# Patient Record
Sex: Female | Born: 1935 | Race: White | Hispanic: No | Smoking: Never smoker
Health system: Southern US, Community
[De-identification: ages and names within clinical notes are randomized; demographics above are authoritative.]

## PROBLEM LIST (undated history)

## (undated) ENCOUNTER — Emergency Department: Discharge status: Absent without leave | Payer: Medicare Other

## (undated) DIAGNOSIS — Z974 Presence of external hearing-aid: Secondary | ICD-10-CM

## (undated) DIAGNOSIS — I340 Nonrheumatic mitral (valve) insufficiency: Secondary | ICD-10-CM

## (undated) DIAGNOSIS — I499 Cardiac arrhythmia, unspecified: Secondary | ICD-10-CM

## (undated) DIAGNOSIS — M1990 Unspecified osteoarthritis, unspecified site: Secondary | ICD-10-CM

## (undated) DIAGNOSIS — I509 Heart failure, unspecified: Secondary | ICD-10-CM

## (undated) DIAGNOSIS — N200 Calculus of kidney: Secondary | ICD-10-CM

## (undated) DIAGNOSIS — I10 Essential (primary) hypertension: Secondary | ICD-10-CM

## (undated) DIAGNOSIS — B029 Zoster without complications: Secondary | ICD-10-CM

## (undated) DIAGNOSIS — Z87898 Personal history of other specified conditions: Secondary | ICD-10-CM

## (undated) DIAGNOSIS — N12 Tubulo-interstitial nephritis, not specified as acute or chronic: Secondary | ICD-10-CM

## (undated) DIAGNOSIS — B019 Varicella without complication: Secondary | ICD-10-CM

## (undated) DIAGNOSIS — R112 Nausea with vomiting, unspecified: Secondary | ICD-10-CM

## (undated) DIAGNOSIS — R319 Hematuria, unspecified: Secondary | ICD-10-CM

## (undated) DIAGNOSIS — T4145XA Adverse effect of unspecified anesthetic, initial encounter: Secondary | ICD-10-CM

## (undated) DIAGNOSIS — E7800 Pure hypercholesterolemia, unspecified: Secondary | ICD-10-CM

## (undated) DIAGNOSIS — T8859XA Other complications of anesthesia, initial encounter: Secondary | ICD-10-CM

## (undated) DIAGNOSIS — M810 Age-related osteoporosis without current pathological fracture: Secondary | ICD-10-CM

## (undated) DIAGNOSIS — K219 Gastro-esophageal reflux disease without esophagitis: Secondary | ICD-10-CM

## (undated) DIAGNOSIS — Z9889 Other specified postprocedural states: Secondary | ICD-10-CM

## (undated) DIAGNOSIS — IMO0001 Reserved for inherently not codable concepts without codable children: Secondary | ICD-10-CM

## (undated) HISTORY — DX: Tubulo-interstitial nephritis, not specified as acute or chronic: N12

## (undated) HISTORY — PX: FOOT SURGERY: SHX648

## (undated) HISTORY — DX: Zoster without complications: B02.9

## (undated) HISTORY — DX: Personal history of other specified conditions: Z87.898

## (undated) HISTORY — DX: Varicella without complication: B01.9

## (undated) HISTORY — DX: Presence of external hearing-aid: Z97.4

## (undated) HISTORY — PX: JOINT REPLACEMENT: SHX530

## (undated) HISTORY — DX: Pure hypercholesterolemia, unspecified: E78.00

## (undated) HISTORY — PX: EYE SURGERY: SHX253

## (undated) HISTORY — DX: Nonrheumatic mitral (valve) insufficiency: I34.0

## (undated) HISTORY — PX: TOTAL KNEE ARTHROPLASTY: SHX125

## (undated) HISTORY — PX: BREAST CYST EXCISION: SHX579

## (undated) HISTORY — PX: RECTOCELE REPAIR: SHX761

## (undated) HISTORY — PX: KNEE ARTHROSCOPY: SUR90

## (undated) HISTORY — PX: OTHER SURGICAL HISTORY: SHX169

## (undated) HISTORY — DX: Age-related osteoporosis without current pathological fracture: M81.0

## (undated) HISTORY — DX: Essential (primary) hypertension: I10

---

## 1968-12-27 HISTORY — PX: VAGINAL HYSTERECTOMY: SUR661

## 1982-12-27 HISTORY — PX: BACK SURGERY: SHX140

## 2005-12-27 DIAGNOSIS — B019 Varicella without complication: Secondary | ICD-10-CM

## 2005-12-27 DIAGNOSIS — B029 Zoster without complications: Secondary | ICD-10-CM

## 2005-12-27 HISTORY — DX: Zoster without complications: B02.9

## 2005-12-27 HISTORY — DX: Varicella without complication: B01.9

## 2006-01-05 ENCOUNTER — Ambulatory Visit: Payer: Self-pay | Admitting: Internal Medicine

## 2006-08-02 ENCOUNTER — Ambulatory Visit: Payer: Self-pay | Admitting: Internal Medicine

## 2006-08-11 ENCOUNTER — Ambulatory Visit: Payer: Self-pay | Admitting: Internal Medicine

## 2007-08-14 ENCOUNTER — Ambulatory Visit: Payer: Self-pay | Admitting: Internal Medicine

## 2007-08-21 ENCOUNTER — Ambulatory Visit: Payer: Self-pay | Admitting: Internal Medicine

## 2008-06-27 LAB — HM PAP SMEAR: HM Pap smear: NORMAL

## 2008-07-27 LAB — HM MAMMOGRAPHY: HM Mammogram: NORMAL

## 2008-08-13 ENCOUNTER — Ambulatory Visit: Payer: Self-pay | Admitting: Internal Medicine

## 2008-11-12 ENCOUNTER — Ambulatory Visit: Payer: Self-pay | Admitting: Family

## 2009-02-04 ENCOUNTER — Ambulatory Visit: Payer: Self-pay | Admitting: Specialist

## 2009-02-13 ENCOUNTER — Ambulatory Visit: Payer: Self-pay | Admitting: Specialist

## 2010-01-01 ENCOUNTER — Ambulatory Visit: Payer: Self-pay | Admitting: Family

## 2010-01-13 ENCOUNTER — Ambulatory Visit: Payer: Self-pay | Admitting: Family

## 2010-02-18 ENCOUNTER — Ambulatory Visit: Payer: Self-pay | Admitting: Family

## 2010-03-02 ENCOUNTER — Ambulatory Visit: Payer: Self-pay | Admitting: General Practice

## 2010-10-01 ENCOUNTER — Ambulatory Visit: Payer: Self-pay | Admitting: Unknown Physician Specialty

## 2010-10-30 ENCOUNTER — Ambulatory Visit: Payer: Self-pay | Admitting: General Practice

## 2010-12-04 ENCOUNTER — Encounter
Admission: RE | Admit: 2010-12-04 | Discharge: 2010-12-04 | Payer: Self-pay | Source: Home / Self Care | Attending: Orthopedic Surgery | Admitting: Orthopedic Surgery

## 2011-01-08 ENCOUNTER — Encounter
Admission: RE | Admit: 2011-01-08 | Discharge: 2011-01-08 | Payer: Self-pay | Source: Home / Self Care | Attending: Orthopedic Surgery | Admitting: Orthopedic Surgery

## 2011-04-12 ENCOUNTER — Ambulatory Visit: Payer: Self-pay | Admitting: Internal Medicine

## 2011-05-31 ENCOUNTER — Ambulatory Visit: Payer: Self-pay | Admitting: General Practice

## 2011-06-23 ENCOUNTER — Inpatient Hospital Stay: Payer: Self-pay | Admitting: General Practice

## 2011-08-13 ENCOUNTER — Encounter: Payer: Self-pay | Admitting: Internal Medicine

## 2011-08-20 ENCOUNTER — Ambulatory Visit (INDEPENDENT_AMBULATORY_CARE_PROVIDER_SITE_OTHER): Payer: Medicare Other | Admitting: Internal Medicine

## 2011-08-20 ENCOUNTER — Encounter: Payer: Self-pay | Admitting: Internal Medicine

## 2011-08-20 VITALS — BP 146/89 | HR 77 | Temp 98.4°F | Resp 16 | Ht 62.0 in | Wt 188.5 lb

## 2011-08-20 DIAGNOSIS — M1990 Unspecified osteoarthritis, unspecified site: Secondary | ICD-10-CM | POA: Insufficient documentation

## 2011-08-20 DIAGNOSIS — E785 Hyperlipidemia, unspecified: Secondary | ICD-10-CM

## 2011-08-20 DIAGNOSIS — E7849 Other hyperlipidemia: Secondary | ICD-10-CM | POA: Insufficient documentation

## 2011-08-20 DIAGNOSIS — I10 Essential (primary) hypertension: Secondary | ICD-10-CM | POA: Insufficient documentation

## 2011-08-20 MED ORDER — LISINOPRIL 40 MG PO TABS: 40.0000 mg | ORAL_TABLET | Freq: Every day | ORAL | Status: DC

## 2011-08-20 NOTE — Progress Notes (Signed)
Subjective:    Patient ID: Tina Howard, female    DOB: [birth year], 77 y.o.   MRN: [id number]  HPI  Tina Howard is a 77 year old female with a history of hypertension and osteoarthritis who presents for followup. She recently had right total knee replacement. During her perioperative period her blood pressure was elevated and she was briefly placed on amlodipine. She has since been off amlodipine. She is currently taking lisinopril hydrochlorothiazide. She reports some elevated blood pressure readings particularly after physical therapy. Her highest recorded blood pressure was 180/90. She denies any headache, chest pain, palpitations, shortness of breath. She does have chronic mild right lower extremity edema.  In regards to her osteoarthritis she has done incredibly well postoperatively. She has completed physical therapy. She is able to ambulate without any assistance. She denies any significant pain in her right knee.  Review of Systems  Constitutional: Negative for fever, chills, appetite change, fatigue and unexpected weight change.  HENT: Negative for ear pain, congestion, sore throat, trouble swallowing, neck pain, voice change and sinus pressure.   Eyes: Negative for visual disturbance.  Respiratory: Negative for cough, shortness of breath, wheezing and stridor.   Cardiovascular: Positive for leg swelling. Negative for chest pain and palpitations.  Gastrointestinal: Negative for nausea, vomiting, abdominal pain, diarrhea, constipation, blood in stool, abdominal distention and anal bleeding.  Genitourinary: Negative for dysuria and flank pain.  Musculoskeletal: Positive for arthralgias. Negative for myalgias and gait problem.  Skin: Negative for color change and rash.  Neurological: Negative for dizziness and headaches.  Hematological: Negative for adenopathy. Does not bruise/bleed easily.  Psychiatric/Behavioral: Negative for suicidal ideas, sleep disturbance and dysphoric mood. The  patient is not nervous/anxious.        Objective:   Physical Exam  Constitutional: She is oriented to person, place, and time. She appears well-developed and well-nourished. No distress.  HENT:  Head: Normocephalic and atraumatic.  Nose: Nose normal.  Eyes: Conjunctivae are normal. Pupils are equal, round, and reactive to light. Right eye exhibits no discharge. Left eye exhibits no discharge. No scleral icterus.  Neck: Normal range of motion. Neck supple. No tracheal deviation present. No thyromegaly present.  Cardiovascular: Normal rate, regular rhythm and normal heart sounds.  Exam reveals no gallop and no friction rub.   No murmur heard. Pulmonary/Chest: Effort normal and breath sounds normal. No respiratory distress. She has no wheezes. She has no rales. She exhibits no tenderness.  Musculoskeletal: Normal range of motion. She exhibits edema (very mild edema around right ankle). She exhibits no tenderness.  Lymphadenopathy:    She has no cervical adenopathy.  Neurological: She is alert and oriented to person, place, and time. No cranial nerve deficit. She exhibits normal muscle tone. Coordination normal.  Skin: Skin is warm, dry and intact. No rash noted. She is not diaphoretic. No erythema. No pallor.          Scar at site of right knee replacement  Psychiatric: She has a normal mood and affect. Her behavior is normal. Judgment and thought content normal.          Assessment & Plan:  1. Hypertension - blood sugar is elevated today. She has also had some problems with hypokalemia. We will discontinue her lisinopril hydrochlorothiazide. We'll start her on lisinopril 40 mg daily. She will have her creatinine and electrolytes rechecked in one week. She will purchase a blood pressure monitor for home. She will bring her blood pressure monitor to our office to confirm accuracy.  She will return to clinic in one month.  2. Osteoarthritis - patient has done well postoperatively after  right knee replacement. She will continue to follow with Tina Howard. She will continue tramadol for pain.  3. Hyperlipidemia - patient has a history of hyperlipidemia. She is currently taking simvastatin. We will check fasting lipid profile and liver function test with labs in one week. She will return to clinic in one month for followup.

## 2011-08-20 NOTE — Patient Instructions (Signed)

## 2011-08-24 ENCOUNTER — Telehealth: Payer: Self-pay | Admitting: Internal Medicine

## 2011-08-24 NOTE — Telephone Encounter (Signed)
Pt takes 4 pills of  amoxcillian 500mg  when she goes to [facility]

## 2011-08-25 NOTE — Telephone Encounter (Signed)
If pt needs refill on Amoxicillin 500mg , 4tabs prior to dental procedures, we can refill this. I wasn't sure from Robin's message.

## 2011-08-26 MED ORDER — AMOXICILLIN 500 MG PO CAPS: ORAL_CAPSULE | ORAL | Status: AC

## 2011-08-26 NOTE — Telephone Encounter (Signed)
Rx sent to pharmacy for patient to take prior to dental procedure.

## 2011-08-31 ENCOUNTER — Other Ambulatory Visit: Payer: Self-pay | Admitting: *Deleted

## 2011-08-31 MED ORDER — SIMVASTATIN 20 MG PO TABS: 20.0000 mg | ORAL_TABLET | Freq: Every day | ORAL | Status: DC

## 2011-09-06 ENCOUNTER — Other Ambulatory Visit: Payer: Self-pay | Admitting: Internal Medicine

## 2011-09-06 MED ORDER — POTASSIUM CHLORIDE CRYS ER 20 MEQ PO TBCR: 20.0000 meq | EXTENDED_RELEASE_TABLET | Freq: Every day | ORAL | Status: DC

## 2011-09-06 NOTE — Telephone Encounter (Signed)
Is this okay to refill? 

## 2011-09-06 NOTE — Telephone Encounter (Signed)
Pt has an appoinment 09/22/11 But she is out of her potassium chl tab 20 meq cr could you call in enough  Until this appointment [facility] [address]

## 2011-09-09 ENCOUNTER — Telehealth: Payer: Self-pay | Admitting: Internal Medicine

## 2011-09-09 NOTE — Telephone Encounter (Signed)
PT WANTED TO BE ASAP SHE HAS COUGH,CONGESTION,SNEEZING. SHE HAD BEEN TAKEN TYLENOL EXTRA /GENERIC COUGH DRY COUGH X 3 WEEKS

## 2011-09-10 NOTE — Telephone Encounter (Signed)
Left message for patient to return my call.

## 2011-09-13 ENCOUNTER — Encounter: Payer: Self-pay | Admitting: Internal Medicine

## 2011-09-13 ENCOUNTER — Ambulatory Visit (INDEPENDENT_AMBULATORY_CARE_PROVIDER_SITE_OTHER): Payer: Medicare Other | Admitting: Internal Medicine

## 2011-09-13 VITALS — BP 149/81 | HR 81 | Temp 98.4°F | Resp 16 | Ht 62.0 in | Wt 186.5 lb

## 2011-09-13 DIAGNOSIS — J40 Bronchitis, not specified as acute or chronic: Secondary | ICD-10-CM

## 2011-09-13 DIAGNOSIS — I10 Essential (primary) hypertension: Secondary | ICD-10-CM

## 2011-09-13 DIAGNOSIS — E785 Hyperlipidemia, unspecified: Secondary | ICD-10-CM

## 2011-09-13 LAB — CBC WITH DIFFERENTIAL/PLATELET
Basophils Absolute: 0 10*3/uL (ref 0.0–0.1)
Basophils Relative: 0.6 % (ref 0.0–3.0)
Hemoglobin: 12.5 g/dL (ref 12.0–15.0)
Lymphocytes Relative: 24.5 % (ref 12.0–46.0)
Monocytes Relative: 6.5 % (ref 3.0–12.0)
Neutro Abs: 4.6 10*3/uL (ref 1.4–7.7)
RBC: 4.78 Mil/uL (ref 3.87–5.11)
WBC: 7 10*3/uL (ref 4.5–10.5)

## 2011-09-13 LAB — MICROALBUMIN / CREATININE URINE RATIO
Microalb Creat Ratio: 0.8 mg/g (ref 0.0–30.0)
Microalb, Ur: 0.4 mg/dL (ref 0.0–1.9)

## 2011-09-13 LAB — COMPREHENSIVE METABOLIC PANEL
ALT: 18 U/L (ref 0–35)
Albumin: 4.3 g/dL (ref 3.5–5.2)
BUN: 13 mg/dL (ref 6–23)
CO2: 30 mEq/L (ref 19–32)
Calcium: 9.6 mg/dL (ref 8.4–10.5)
Chloride: 103 mEq/L (ref 96–112)
Creatinine, Ser: 0.5 mg/dL (ref 0.4–1.2)
GFR: 133.54 mL/min (ref >60.00)
Potassium: 3.7 mEq/L (ref 3.5–5.1)

## 2011-09-13 LAB — LIPID PANEL: HDL: 61.4 mg/dL (ref >39.00)

## 2011-09-13 MED ORDER — PREDNISONE (PAK) 10 MG PO TABS: ORAL_TABLET | ORAL | Status: DC

## 2011-09-13 MED ORDER — AZITHROMYCIN 250 MG PO TABS: ORAL_TABLET | ORAL | Status: AC

## 2011-09-13 NOTE — Progress Notes (Signed)
Subjective:    Patient ID: Tina Howard, female    DOB: [birth year], 77 y.o.   MRN: [id number]  HPI Tina Howard is a 77 year old female with a history of hypertension who presents for an acute visit complaining of cough which is productive of purulent sputum and shortness of breath which has been present for approximately one week. She reports her symptoms initially started with sore throat and congestion followed by muscle aches. She also felt that she may have had a low-grade temperature. She developed a cough soon after that and this cough has persisted for the last 2 weeks. She does not have any known sick contacts however participates often in church events. She has taken over-the-counter cough preparations with no improvement in her symptoms.  Outpatient Encounter Prescriptions as of 09/13/2011  Medication Sig Dispense Refill  . amoxicillin (AMOXIL) 500 MG capsule 4 tabs prior to dental procedure.  4 capsule  0  . aspirin 81 MG tablet Take 81 mg by mouth daily.        . Calcium Carbonate-Vitamin D (CALCIUM + D) 600-200 MG-UNIT TABS Take one tablet by mouth twice a day       . Docusate Calcium (STOOL SOFTENER PO) Take by mouth as directed       . lisinopril (PRINIVIL,ZESTRIL) 40 MG tablet Take 1 tablet (40 mg total) by mouth daily.  30 tablet  3  . Multiple Vitamins-Minerals (PX SENIOR VITAMIN PO) Take by mouth as directed       . potassium chloride SA (KLOR-CON M20) 20 MEQ tablet Take 1 tablet (20 mEq total) by mouth daily.  30 tablet  0  . simvastatin (ZOCOR) 20 MG tablet Take 1 tablet (20 mg total) by mouth daily.  30 tablet  3  . timolol (TIMOPTIC) 0.5 % ophthalmic solution Place 1 drop into both eyes daily.       . traMADol (ULTRAM) 50 MG tablet       . azithromycin (ZITHROMAX Z-PAK) 250 MG tablet Take 2 tablets (500 mg) on  Day 1,  followed by 1 tablet (250 mg) once daily on Days 2 through 5.  6 each  0  . predniSONE (STERAPRED UNI-PAK) 10 MG tablet Take 60mg  today, then taper by 10mg   daily until gone.  21 tablet  0    Review of Systems  Constitutional: Negative for fever, chills, appetite change, fatigue and unexpected weight change.  HENT: Positive for congestion and sinus pressure. Negative for ear pain, sore throat, trouble swallowing, neck pain and voice change.   Eyes: Negative for visual disturbance.  Respiratory: Positive for cough, chest tightness and shortness of breath. Negative for wheezing and stridor.   Cardiovascular: Negative for chest pain, palpitations and leg swelling.  Gastrointestinal: Negative for nausea, vomiting, abdominal pain, diarrhea, constipation, blood in stool, abdominal distention and anal bleeding.  Genitourinary: Negative for dysuria and flank pain.  Musculoskeletal: Positive for myalgias. Negative for arthralgias and gait problem.  Skin: Negative for color change and rash.  Neurological: Negative for dizziness and headaches.  Hematological: Negative for adenopathy. Does not bruise/bleed easily.  Psychiatric/Behavioral: Negative for suicidal ideas, sleep disturbance and dysphoric mood. The patient is not nervous/anxious.    BP 149/81  Pulse 81  Temp(Src) 98.4 F (36.9 C) (Oral)  Resp 16  Ht 5\' 2"  (1.575 m)  Wt 186 lb 8 oz (84.596 kg)  BMI 34.11 kg/m2  SpO2 98%     Objective:   Physical Exam  Constitutional: She is oriented to person,  place, and time. She appears well-developed and well-nourished. No distress.  HENT:  Head: Normocephalic and atraumatic.  Right Ear: External ear normal.  Left Ear: External ear normal.  Nose: Nose normal.  Mouth/Throat: Oropharynx is clear and moist. No oropharyngeal exudate.  Eyes: Conjunctivae are normal. Pupils are equal, round, and reactive to light. Right eye exhibits no discharge. Left eye exhibits no discharge. No scleral icterus.  Neck: Normal range of motion. Neck supple. No tracheal deviation present. No thyromegaly present.  Cardiovascular: Normal rate, regular rhythm, normal heart  sounds and intact distal pulses.  Exam reveals no gallop and no friction rub.   No murmur heard. Pulmonary/Chest: No accessory muscle usage. Not tachypneic. No respiratory distress. She has no decreased breath sounds. She has wheezes. She has no rales. She exhibits no tenderness.  Musculoskeletal: Normal range of motion. She exhibits no edema and no tenderness.  Lymphadenopathy:    She has no cervical adenopathy.  Neurological: She is alert and oriented to person, place, and time. No cranial nerve deficit. She exhibits normal muscle tone. Coordination normal.  Skin: Skin is warm and dry. No rash noted. She is not diaphoretic. No erythema. No pallor.  Psychiatric: She has a normal mood and affect. Her behavior is normal. Judgment and thought content normal.          Assessment & Plan:  1. Acute bronchitis - symptoms and exam are consistent with acute bronchitis. Will plan to treat with azithromycin and prednisone, and codeine for cough. Patient has tolerated this well in the past. She will call or return to clinic should symptoms not improve over the next 48 hours. She will followup in one week.

## 2011-09-13 NOTE — Patient Instructions (Signed)
Bronchitis Bronchitis is the body's way of reacting to injury and/or infection (inflammation) of the bronchi. Bronchi are the air tubes that extend from the windpipe into the lungs. If the inflammation becomes severe, it may cause shortness of breath.  CAUSES Inflammation may be caused by:  A virus.   Germs (bacteria).   Dust.   Allergens.   Pollutants and many other irritants.  The cells lining the bronchial tree are covered with tiny hairs (cilia). These constantly beat upward, away from the lungs, toward the mouth. This keeps the lungs free of pollutants. When these cells become too irritated and are unable to do their job, mucus begins to develop. This causes the characteristic cough of bronchitis. The cough clears the lungs when the cilia are unable to do their job. Without either of these protective mechanisms, the mucus would settle in the lungs. Then you would develop pneumonia. Smoking is a common cause of bronchitis and can contribute to pneumonia. Stopping this habit is the single most important thing you can do to help yourself. TREATMENT  Your caregiver may prescribe an antibiotic if the cough is caused by bacteria. Also, medicines that open up your airways make it easier to breathe. Your caregiver may also recommend or prescribe an expectorant. It will loosen the mucus to be coughed up. Only take over-the-counter or prescription medicines for pain, discomfort, or fever as directed by your caregiver.   Removing whatever causes the problem (smoking, for example) is critical to preventing the problem from getting worse.   Cough suppressants may be prescribed for relief of cough symptoms.   Inhaled medicines may be prescribed to help with symptoms now and to help prevent problems from returning.   For those with recurrent (chronic) bronchitis, there may be a need for steroid medicines.  SEEK IMMEDIATE MEDICAL CARE IF:  During treatment, you develop more pus-like mucus  (purulent sputum).   You or your child has an oral temperature above 101.5, not controlled by medicine.   Your baby is older than 3 months with a rectal temperature of 102 F (38.9 C) or higher.   Your baby is 3 months old or younger with a rectal temperature of 100.4 F (38 C) or higher.   You become progressively more ill.   You have increased difficulty breathing, wheezing, or shortness of breath.  It is necessary to seek immediate medical care if you are elderly or sick from any other disease. MAKE SURE YOU:  Understand these instructions.   Will watch your condition.   Will get help right away if you are not doing well or get worse.  Document Released: 12/13/2005 Document Re-Released: 03/09/2010 [employer] Patient Information 2011 [employer]. 

## 2011-09-20 ENCOUNTER — Other Ambulatory Visit: Payer: Medicare Other

## 2011-09-24 ENCOUNTER — Encounter: Payer: Self-pay | Admitting: Internal Medicine

## 2011-09-24 ENCOUNTER — Ambulatory Visit (INDEPENDENT_AMBULATORY_CARE_PROVIDER_SITE_OTHER): Payer: Medicare Other | Admitting: Internal Medicine

## 2011-09-24 VITALS — BP 162/93 | HR 78 | Temp 98.4°F | Resp 16 | Ht 62.0 in | Wt 186.0 lb

## 2011-09-24 DIAGNOSIS — Z23 Encounter for immunization: Secondary | ICD-10-CM

## 2011-09-24 DIAGNOSIS — J40 Bronchitis, not specified as acute or chronic: Secondary | ICD-10-CM

## 2011-09-24 DIAGNOSIS — E785 Hyperlipidemia, unspecified: Secondary | ICD-10-CM

## 2011-09-24 DIAGNOSIS — D239 Other benign neoplasm of skin, unspecified: Secondary | ICD-10-CM

## 2011-09-24 DIAGNOSIS — D229 Melanocytic nevi, unspecified: Secondary | ICD-10-CM

## 2011-09-24 DIAGNOSIS — I10 Essential (primary) hypertension: Secondary | ICD-10-CM

## 2011-09-24 MED ORDER — LISINOPRIL 40 MG PO TABS: 40.0000 mg | ORAL_TABLET | Freq: Every day | ORAL | Status: DC

## 2011-09-24 MED ORDER — POTASSIUM CHLORIDE CRYS ER 20 MEQ PO TBCR: 20.0000 meq | EXTENDED_RELEASE_TABLET | Freq: Every day | ORAL | Status: DC

## 2011-09-24 MED ORDER — SIMVASTATIN 20 MG PO TABS: 20.0000 mg | ORAL_TABLET | Freq: Every day | ORAL | Status: DC

## 2011-09-24 MED ORDER — BLOOD PRESSURE KIT: 1.0000 | PACK | Status: DC | PRN

## 2011-09-24 NOTE — Patient Instructions (Signed)
Follow up blood pressure check in 1 week.

## 2011-09-24 NOTE — Progress Notes (Signed)
Subjective:    Patient ID: Tina Howard, female    DOB: April 12, 1933, 77 y.o.   MRN: [id number]  HPI Tina Howard is a 77 year old female with a history of hypertension hyperlipidemia and a recent episode of bronchitis who presents for followup. She reports that her cough is improved. She continues to have some postnasal drip. She reports that her voice is still slightly hoarse from her upper respiratory infection. She denies any persistent shortness of breath. She does have intermittent cough. She has completed all of her antibiotics. She denies any fever or chills.  She reports that her blood pressure has been slightly elevated on some occasions typically 150 over 80s. She denies any chest pain, palpitations, headache. She reports full compliance with her medications.  Outpatient Encounter Prescriptions as of 09/24/2011  Medication Sig Dispense Refill  . amoxicillin (AMOXIL) 500 MG capsule 4 tabs prior to dental procedure.  4 capsule  0  . aspirin 81 MG tablet Take 81 mg by mouth daily.        . Calcium Carbonate-Vitamin D (CALCIUM + D) 600-200 MG-UNIT TABS Take one tablet by mouth twice a day       . Docusate Calcium (STOOL SOFTENER PO) Take by mouth as directed       . lisinopril (PRINIVIL,ZESTRIL) 40 MG tablet Take 1 tablet (40 mg total) by mouth daily.  90 tablet  4  . Multiple Vitamins-Minerals (PX SENIOR VITAMIN PO) Take by mouth as directed       . potassium chloride SA (KLOR-CON M20) 20 MEQ tablet Take 1 tablet (20 mEq total) by mouth daily.  90 tablet  4  . simvastatin (ZOCOR) 20 MG tablet Take 1 tablet (20 mg total) by mouth daily.  90 tablet  4  . timolol (TIMOPTIC) 0.5 % ophthalmic solution Place 1 drop into both eyes daily.         Review of Systems  Constitutional: Negative for fever, chills, appetite change, fatigue and unexpected weight change.  HENT: Positive for voice change. Negative for ear pain, congestion, sore throat, trouble swallowing, neck pain and sinus pressure.     Eyes: Negative for visual disturbance.  Respiratory: Positive for cough. Negative for shortness of breath, wheezing and stridor.   Cardiovascular: Negative for chest pain, palpitations and leg swelling.  Gastrointestinal: Negative for nausea, vomiting, abdominal pain, diarrhea, constipation, blood in stool, abdominal distention and anal bleeding.  Genitourinary: Negative for dysuria and flank pain.  Musculoskeletal: Negative for myalgias, arthralgias and gait problem.  Skin: Negative for color change and rash.  Neurological: Negative for dizziness and headaches.  Hematological: Negative for adenopathy. Does not bruise/bleed easily.  Psychiatric/Behavioral: Negative for suicidal ideas, sleep disturbance and dysphoric mood. The patient is not nervous/anxious.    BP 162/93  Pulse 78  Temp(Src) 98.4 F (36.9 C) (Oral)  Resp 16  Ht 5\' 2"  (1.575 m)  Wt 186 lb (84.369 kg)  BMI 34.02 kg/m2  SpO2 99%     Objective:   Physical Exam  Constitutional: She is oriented to person, place, and time. She appears well-developed and well-nourished. No distress.  HENT:  Head: Normocephalic and atraumatic.  Right Ear: Tympanic membrane, external ear and ear canal normal.  Left Ear: Tympanic membrane, external ear and ear canal normal.  Nose: Nose normal.  Mouth/Throat: Oropharynx is clear and moist. No oropharyngeal exudate.  Eyes: Conjunctivae are normal. Pupils are equal, round, and reactive to light. Right eye exhibits no discharge. Left eye exhibits no discharge. No scleral  icterus.  Neck: Normal range of motion. Neck supple. No tracheal deviation present. No thyromegaly present.  Cardiovascular: Normal rate, regular rhythm, normal heart sounds and intact distal pulses.  Exam reveals no gallop and no friction rub.   No murmur heard. Pulmonary/Chest: Effort normal and breath sounds normal. No respiratory distress. She has no wheezes. She has no rales. She exhibits no tenderness.  Musculoskeletal:  Normal range of motion. She exhibits no edema and no tenderness.  Lymphadenopathy:    She has no cervical adenopathy.  Neurological: She is alert and oriented to person, place, and time. No cranial nerve deficit. She exhibits normal muscle tone. Coordination normal.  Skin: Skin is warm and dry. No rash noted. She is not diaphoretic. No erythema. No pallor.          Atypical dark brown irregular nevus, right upper back  Psychiatric: She has a normal mood and affect. Her behavior is normal. Judgment and thought content normal.          Assessment & Plan:  1. Bronchitis -improved with antibiotic therapy. We'll continue to monitor. She will call if symptoms recur.  2. Hypertension - blood pressure slightly elevated today. Will plan to continue current medications for now and have her recheck blood pressure with the nurse check in one week. If blood pressure remains elevated we'll consider either adding back her hydrochlorothiazide or amlodipine. Recent renal function was normal.   3. Hyperlipidemia - patient with hyperlipidemia. Recent lipids show excellent control with LDL less than 70. Will continue simvastatin.  4. 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 Nevus has dark brown pigmentation and irregular borders. I've encouraged her to followup with her dermatologist for removal.

## 2011-10-08 ENCOUNTER — Ambulatory Visit: Payer: Medicare Other

## 2011-10-11 ENCOUNTER — Ambulatory Visit (INDEPENDENT_AMBULATORY_CARE_PROVIDER_SITE_OTHER): Payer: Medicare Other | Admitting: Internal Medicine

## 2011-10-11 ENCOUNTER — Encounter: Payer: Self-pay | Admitting: Internal Medicine

## 2011-10-11 VITALS — BP 137/78 | HR 82 | Temp 97.9°F | Resp 12 | Ht 62.0 in | Wt 188.0 lb

## 2011-10-11 DIAGNOSIS — D239 Other benign neoplasm of skin, unspecified: Secondary | ICD-10-CM

## 2011-10-11 DIAGNOSIS — D229 Melanocytic nevi, unspecified: Secondary | ICD-10-CM

## 2011-10-11 DIAGNOSIS — I10 Essential (primary) hypertension: Secondary | ICD-10-CM

## 2011-10-11 DIAGNOSIS — R42 Dizziness and giddiness: Secondary | ICD-10-CM

## 2011-10-11 NOTE — Progress Notes (Signed)
Subjective:    Patient ID: Tina Howard, female    DOB: [birth year], 77 y.o.   MRN: [id number]  HPI [name] is a 77 year old female who presents to followup hypertension. She was seen 3 days ago with blood pressures in the 200/100 range. She was started back on her amlodipine. She did not bring a record of her blood pressure this weekend, however she reports that she has been feeling better with less episodes of palpitations. She continues to have some episodes of dizziness particularly when moving from seated to standing position. She denies any chest pain. She denies any focal neurologic deficits. She denies any falls or syncope. She denies any weakness.  Outpatient Encounter Prescriptions as of 10/11/2011  Medication Sig Dispense Refill  . amLODipine (NORVASC) 5 MG tablet Take 5 mg by mouth daily.        Marland Kitchen aspirin 81 MG tablet Take 81 mg by mouth daily.        . Blood Pressure KIT 1 kit by Does not apply route as needed.  1 each  0  . Calcium Carbonate-Vitamin D (CALCIUM + D) 600-200 MG-UNIT TABS Take one tablet by mouth twice a day       . Docusate Calcium (STOOL SOFTENER PO) Take by mouth as directed       . lisinopril (PRINIVIL,ZESTRIL) 40 MG tablet Take 1 tablet (40 mg total) by mouth daily.  90 tablet  4  . Multiple Vitamins-Minerals (PX SENIOR VITAMIN PO) Take by mouth as directed       . potassium chloride SA (KLOR-CON M20) 20 MEQ tablet Take 1 tablet (20 mEq total) by mouth daily.  90 tablet  4  . timolol (TIMOPTIC) 0.5 % ophthalmic solution Place 1 drop into both eyes daily.       . simvastatin (ZOCOR) 20 MG tablet Take 1 tablet (20 mg total) by mouth daily.  90 tablet  4     Review of Systems  Constitutional: Negative for fever, chills, appetite change, fatigue and unexpected weight change.  HENT: Negative for ear pain, congestion, sore throat, trouble swallowing, neck pain, voice change and sinus pressure.   Eyes: Negative for visual disturbance.  Respiratory: Negative for  cough, shortness of breath, wheezing and stridor.   Cardiovascular: Negative for chest pain, palpitations and leg swelling.  Gastrointestinal: Negative for nausea, vomiting, abdominal pain, diarrhea, constipation, blood in stool, abdominal distention and anal bleeding.  Genitourinary: Negative for dysuria and flank pain.  Musculoskeletal: Positive for back pain. Negative for myalgias, arthralgias and gait problem.  Skin: Negative for color change and rash.  Neurological: Positive for dizziness and light-headedness. Negative for tremors, syncope, speech difficulty, weakness, numbness and headaches.  Hematological: Negative for adenopathy. Does not bruise/bleed easily.  Psychiatric/Behavioral: Negative for suicidal ideas, sleep disturbance and dysphoric mood. The patient is not nervous/anxious.    BP 137/78  Pulse 82  Temp(Src) 97.9 F (36.6 C) (Oral)  Resp 12  Ht 5\' 2"  (1.575 m)  Wt 188 lb (85.276 kg)  BMI 34.39 kg/m2  SpO2 98%     Objective:   Physical Exam  Constitutional: She is oriented to person, place, and time. She appears well-developed and well-nourished. No distress.  HENT:  Head: Normocephalic and atraumatic.  Right Ear: External ear normal.  Left Ear: External ear normal.  Nose: Nose normal.  Mouth/Throat: Oropharynx is clear and moist. No oropharyngeal exudate.  Eyes: Conjunctivae are normal. Pupils are equal, round, and reactive to light. Right eye exhibits no discharge.  Left eye exhibits no discharge. No scleral icterus.  Neck: Normal range of motion. Neck supple. No tracheal deviation present. No thyromegaly present.  Cardiovascular: Normal rate, regular rhythm, normal heart sounds and intact distal pulses.  Exam reveals no gallop and no friction rub.   No murmur heard. Pulmonary/Chest: Effort normal and breath sounds normal. No respiratory distress. She has no wheezes. She has no rales. She exhibits no tenderness.  Musculoskeletal: Normal range of motion. She  exhibits no edema and no tenderness.  Lymphadenopathy:    She has no cervical adenopathy.  Neurological: She is alert and oriented to person, place, and time. No cranial nerve deficit. She exhibits normal muscle tone. Coordination normal.  Skin: Skin is warm and dry. No rash noted. She is not diaphoretic. No erythema. No pallor.  Psychiatric: She has a normal mood and affect. Her behavior is normal. Judgment and thought content normal.          Assessment & Plan:  1. Hypertension - patient with history of hypertension. Recent exacerbation led to [place] restarting her amlodipine. Her blood pressure is much improved today. We'll plan to continue amlodipine 5 mg daily. She will followup in one month.  2. Dizziness - patient complains of dizziness when bending over or when moving from seated to standing position quickly. Suspect this is related to orthostatic changes. Exam is normal. However, will get carotid Dopplers for evaluation.

## 2011-10-15 ENCOUNTER — Other Ambulatory Visit: Payer: Self-pay | Admitting: *Deleted

## 2011-10-15 DIAGNOSIS — R42 Dizziness and giddiness: Secondary | ICD-10-CM

## 2011-10-26 ENCOUNTER — Encounter: Payer: Medicare Other | Admitting: *Deleted

## 2011-11-09 ENCOUNTER — Encounter (INDEPENDENT_AMBULATORY_CARE_PROVIDER_SITE_OTHER): Payer: Medicare Other | Admitting: *Deleted

## 2011-11-09 DIAGNOSIS — R42 Dizziness and giddiness: Secondary | ICD-10-CM

## 2011-11-16 ENCOUNTER — Encounter: Payer: Self-pay | Admitting: Internal Medicine

## 2011-11-16 ENCOUNTER — Ambulatory Visit (INDEPENDENT_AMBULATORY_CARE_PROVIDER_SITE_OTHER): Payer: Medicare Other | Admitting: Internal Medicine

## 2011-11-16 VITALS — BP 124/76 | HR 68 | Temp 98.0°F | Resp 14 | Wt 191.2 lb

## 2011-11-16 DIAGNOSIS — R002 Palpitations: Secondary | ICD-10-CM

## 2011-11-16 DIAGNOSIS — M1990 Unspecified osteoarthritis, unspecified site: Secondary | ICD-10-CM

## 2011-11-16 DIAGNOSIS — I10 Essential (primary) hypertension: Secondary | ICD-10-CM

## 2011-11-16 NOTE — Progress Notes (Signed)
Subjective:    Patient ID: Tina Howard, female    DOB: [birth year], 77 y.o.   MRN: [id number]  HPI 77YO female with h/o hypertension and osteoarthritis presents for follow up. She reports that she has been feeling well. She reports that her blood pressure has been well-controlled and notes that her systolic blood pressure was 110 on her last visit to her orthopedic surgeon. She notes that with the addition of amlodipine she occasionally has swelling in her lower legs. This seems to be worse in the evenings. She has also noted some intermittent palpitations. She describes this as a forceful heartbeat which is most prominent in the evenings. She denies any chest pain, diaphoresis, or shortness of breath. She otherwise reports she is feeling well.  She notes that she was recently seen by her orthopedic surgeon who performed imaging of her right shoulder. She reports severe osteoarthritis in her right shoulder and notes that she is scheduled for right shoulder replacement later this year. She is planning to stay in a rehabilitation facility after surgery. She is currently using Mobic for her right shoulder pain with minimal improvement.  Outpatient Encounter Prescriptions as of 11/16/2011  Medication Sig Dispense Refill  . amLODipine (NORVASC) 5 MG tablet Take 5 mg by mouth daily.        Marland Kitchen aspirin 81 MG tablet Take 81 mg by mouth daily.        . Blood Pressure KIT 1 kit by Does not apply route as needed.  1 each  0  . Calcium Carbonate-Vitamin D (CALCIUM + D) 600-200 MG-UNIT TABS Take one tablet by mouth twice a day       . Docusate Calcium (STOOL SOFTENER PO) Take by mouth as directed       . lisinopril (PRINIVIL,ZESTRIL) 40 MG tablet Take 1 tablet (40 mg total) by mouth daily.  90 tablet  4  . meloxicam (MOBIC) 7.5 MG tablet Take 7.5 mg by mouth 2 (two) times daily.        . Multiple Vitamins-Minerals (PX SENIOR VITAMIN PO) Take by mouth as directed       . potassium chloride SA (KLOR-CON M20) 20  MEQ tablet Take 1 tablet (20 mEq total) by mouth daily.  90 tablet  4  . simvastatin (ZOCOR) 20 MG tablet Take 1 tablet (20 mg total) by mouth daily.  90 tablet  4  . timolol (TIMOPTIC) 0.5 % ophthalmic solution Place 1 drop into both eyes daily.       . AMOXICILLIN PO Take by mouth as directed. PRIOR TO DENTAL ONLY.         Review of Systems  Constitutional: Negative for fever, chills, appetite change, fatigue and unexpected weight change.  HENT: Negative for ear pain, congestion, sore throat, trouble swallowing, neck pain, voice change and sinus pressure.   Eyes: Negative for visual disturbance.  Respiratory: Negative for cough, shortness of breath, wheezing and stridor.   Cardiovascular: Positive for leg swelling (at end of day). Negative for chest pain and palpitations.  Gastrointestinal: Negative for nausea, vomiting, abdominal pain, diarrhea, constipation, blood in stool, abdominal distention and anal bleeding.  Genitourinary: Negative for dysuria and flank pain.  Musculoskeletal: Positive for myalgias and arthralgias. Negative for gait problem.  Skin: Negative for color change and rash.  Neurological: Negative for dizziness and headaches.  Hematological: Negative for adenopathy. Does not bruise/bleed easily.  Psychiatric/Behavioral: Negative for suicidal ideas, sleep disturbance and dysphoric mood. The patient is not nervous/anxious.    BP  124/76  Pulse 68  Temp(Src) 98 F (36.7 C) (Oral)  Resp 14  Wt 191 lb 4 oz (86.75 kg)  SpO2 95%     Objective:   Physical Exam  Constitutional: She is oriented to person, place, and time. She appears well-developed and well-nourished. No distress.  HENT:  Head: Normocephalic and atraumatic.  Right Ear: External ear normal.  Left Ear: External ear normal.  Nose: Nose normal.  Mouth/Throat: Oropharynx is clear and moist. No oropharyngeal exudate.  Eyes: Conjunctivae are normal. Pupils are equal, round, and reactive to light. Right eye  exhibits no discharge. Left eye exhibits no discharge. No scleral icterus.  Neck: Normal range of motion. Neck supple. No tracheal deviation present. No thyromegaly present.  Cardiovascular: Normal rate, normal heart sounds and intact distal pulses.  A regularly irregular rhythm present. Exam reveals no gallop and no friction rub.   No murmur heard. Pulmonary/Chest: Effort normal and breath sounds normal. No respiratory distress. She has no wheezes. She has no rales. She exhibits no tenderness.  Musculoskeletal: Normal range of motion. She exhibits no edema and no tenderness.  Lymphadenopathy:    She has no cervical adenopathy.  Neurological: She is alert and oriented to person, place, and time. No cranial nerve deficit. She exhibits normal muscle tone. Coordination normal.  Skin: Skin is warm and dry. No rash noted. She is not diaphoretic. No erythema. No pallor.  Psychiatric: She has a normal mood and affect. Her behavior is normal. Judgment and thought content normal.          Assessment & Plan:  1. Hypertension -BP well controlled on current meds.  Will continue. Repeat renal function with labs in 3 months.  2. Palpitations - PACs by EKG.  Will continue to monitor. If bothersome to patient, might consider changing amlodipine to Bystolic. Follow up prn.  3. Right shoulder pain - Pt reports ortho is planning for right shoulder replacement later this year. She expects to go to rehab after this procedure. Will follow.

## 2011-11-23 ENCOUNTER — Telehealth: Payer: Self-pay | Admitting: Internal Medicine

## 2011-11-23 NOTE — Telephone Encounter (Signed)
[phone] Pt came in needed a rx for amlodipine tab 5 mg take 1 tablet by mouth daily   90 day supply [facility] Pt has 3 day left

## 2011-11-23 NOTE — Telephone Encounter (Signed)
See below note Correct phone # [phone]

## 2011-11-24 MED ORDER — AMLODIPINE BESYLATE 5 MG PO TABS: 5.0000 mg | ORAL_TABLET | Freq: Every day | ORAL | Status: DC

## 2011-11-24 NOTE — Telephone Encounter (Signed)
Done,  Patient informed

## 2011-12-01 ENCOUNTER — Ambulatory Visit (INDEPENDENT_AMBULATORY_CARE_PROVIDER_SITE_OTHER): Payer: Medicare Other | Admitting: Internal Medicine

## 2011-12-01 ENCOUNTER — Other Ambulatory Visit: Payer: Self-pay | Admitting: Internal Medicine

## 2011-12-01 ENCOUNTER — Encounter: Payer: Self-pay | Admitting: Internal Medicine

## 2011-12-01 VITALS — BP 120/70 | HR 63 | Temp 97.8°F | Wt 187.0 lb

## 2011-12-01 DIAGNOSIS — M1990 Unspecified osteoarthritis, unspecified site: Secondary | ICD-10-CM

## 2011-12-01 DIAGNOSIS — B372 Candidiasis of skin and nail: Secondary | ICD-10-CM

## 2011-12-01 DIAGNOSIS — K5900 Constipation, unspecified: Secondary | ICD-10-CM

## 2011-12-01 DIAGNOSIS — M129 Arthropathy, unspecified: Secondary | ICD-10-CM

## 2011-12-01 MED ORDER — MELOXICAM 7.5 MG PO TABS: 7.5000 mg | ORAL_TABLET | Freq: Two times a day (BID) | ORAL | Status: DC

## 2011-12-01 MED ORDER — POLYETHYLENE GLYCOL 3350 17 G PO PACK: 17.0000 g | PACK | Freq: Every day | ORAL | Status: AC

## 2011-12-01 MED ORDER — MICONAZOLE NITRATE 2 % EX CREA: TOPICAL_CREAM | CUTANEOUS | Status: AC

## 2011-12-01 NOTE — Patient Instructions (Signed)
Apply Miconazole to belly button twice daily x 1 week. Call if redness increased or any fever or chills.

## 2011-12-01 NOTE — Progress Notes (Signed)
Addended by: Jobie Quaker on: 12/01/2011 02:48 PM   Modules accepted: Orders

## 2011-12-01 NOTE — Progress Notes (Signed)
Subjective:    Patient ID: Tina Howard, female    DOB: 1933/11/28, 77 y.o.   MRN: [id number]  HPI 77 year old female with a history of hypertension and arthritis presents for an acute visit complaining of redness in her bellybutton and without odor over the last several days. She notes that she tried to clean her bellybutton with a Q-tip because of the outer. She denies any drainage from her bellybutton. She noticed the redness shortly after she cleaned the area. She has been putting moisturizing cream over her abdomen and in her bellybutton. She denies any fever or chills. She denies any pain at the site. She does have a known umbilical hernia.  She is also concerned today about constipation. She notes that she frequently has to strain to have a bowel movement. She has tried using docusate with no improvement. She denies any blood in her stool or black tarry stools. She denies any abdominal pain.  She is also concerned about right shoulder arthritis. She reports that she has been taking meloxicam but recently ran out of it. She is unsure about whether she should continue this medication. She is currently being evaluated by orthopedic surgery for right shoulder replacement. She denies any swelling in her shoulder. She denies any weakness in her arm.  Outpatient Encounter Prescriptions as of 12/01/2011  Medication Sig Dispense Refill  . amLODipine (NORVASC) 5 MG tablet Take 1 tablet (5 mg total) by mouth daily.  90 tablet  1  . AMOXICILLIN PO Take by mouth as directed. PRIOR TO DENTAL ONLY.       Marland Kitchen aspirin 81 MG tablet Take 81 mg by mouth daily.        . Blood Pressure KIT 1 kit by Does not apply route as needed.  1 each  0  . Calcium Carbonate-Vitamin D (CALCIUM + D) 600-200 MG-UNIT TABS Take one tablet by mouth twice a day       . Docusate Calcium (STOOL SOFTENER PO) Take by mouth as directed       . lisinopril (PRINIVIL,ZESTRIL) 40 MG tablet Take 1 tablet (40 mg total) by mouth daily.  90  tablet  4  . meloxicam (MOBIC) 7.5 MG tablet Take 7.5 mg by mouth 2 (two) times daily.        . Multiple Vitamins-Minerals (PX SENIOR VITAMIN PO) Take by mouth as directed       . potassium chloride SA (KLOR-CON M20) 20 MEQ tablet Take 1 tablet (20 mEq total) by mouth daily.  90 tablet  4  . simvastatin (ZOCOR) 20 MG tablet Take 1 tablet (20 mg total) by mouth daily.  90 tablet  4  . timolol (TIMOPTIC) 0.5 % ophthalmic solution Place 1 drop into both eyes daily.         Review of Systems  Constitutional: Negative for fever, chills, diaphoresis and fatigue.  Respiratory: Negative for shortness of breath.   Cardiovascular: Negative for chest pain.  Gastrointestinal: Positive for constipation. Negative for nausea, abdominal pain, blood in stool, abdominal distention and anal bleeding.  Musculoskeletal: Positive for myalgias and arthralgias.  Skin: Positive for color change.   .vs    Objective:   Physical Exam  Constitutional: She appears well-developed and well-nourished. No distress.  HENT:  Head: Normocephalic and atraumatic.  Eyes: EOM are normal.  Pulmonary/Chest: Breath sounds normal.  Abdominal: Soft. Bowel sounds are normal. She exhibits no distension. There is no tenderness. There is no rebound and no guarding.    Skin: She  is not diaphoretic.          Assessment & Plan:  1. Candidiasis skin - will treat with topical miconazole. Patient will call if no improvement.  2. Constipation - encouraged patient to try MiraLax 17 g twice daily. She will call if no improvement.  3. Arthritis - patient with chronic arthritis in her right shoulder. She recently ran out of her meloxicam. We will refill this for her today.

## 2011-12-04 LAB — WOUND CULTURE: Gram Stain: NONE SEEN

## 2011-12-28 HISTORY — PX: HERNIA REPAIR: SHX51

## 2012-01-17 ENCOUNTER — Encounter: Payer: Self-pay | Admitting: Internal Medicine

## 2012-01-17 ENCOUNTER — Ambulatory Visit (INDEPENDENT_AMBULATORY_CARE_PROVIDER_SITE_OTHER): Payer: Medicare Other | Admitting: Internal Medicine

## 2012-01-17 ENCOUNTER — Other Ambulatory Visit: Payer: Self-pay | Admitting: Internal Medicine

## 2012-01-17 VITALS — BP 138/68 | HR 71 | Temp 98.1°F | Wt 177.0 lb

## 2012-01-17 DIAGNOSIS — L0882 Omphalitis not of newborn: Secondary | ICD-10-CM | POA: Insufficient documentation

## 2012-01-17 DIAGNOSIS — L089 Local infection of the skin and subcutaneous tissue, unspecified: Secondary | ICD-10-CM

## 2012-01-17 DIAGNOSIS — K429 Umbilical hernia without obstruction or gangrene: Secondary | ICD-10-CM | POA: Insufficient documentation

## 2012-01-17 DIAGNOSIS — J019 Acute sinusitis, unspecified: Secondary | ICD-10-CM

## 2012-01-17 MED ORDER — AMOXICILLIN-POT CLAVULANATE 875-125 MG PO TABS: 1.0000 | ORAL_TABLET | Freq: Two times a day (BID) | ORAL | Status: AC

## 2012-01-17 NOTE — Progress Notes (Signed)
Subjective:    Patient ID: Tina Howard, female    DOB: [birth year], 78 y.o.   MRN: [id number]  HPI 78 year old female with a history of hypertension and hyperlipidemia presents for an acute visit complaining of a rash over her umbilical area and sinus congestion. First, she has rash in her umbilicus which she reports has been present >67month. She reports the rash in her umbilical area improved somewhat with the use of Lotrimin as prescribed at her last visit. However, there continues to be persistent redness in the center of her umbilicus and she occasionally feels some fullness in the area. She denies any fever or chills. She denies any drainage from her umbilicus.  She also notes a several week history of nasal congestion, and intermittent nonproductive cough. She denies any fever or chills. She denies shortness of breath or chest pain. She has been using over-the-counter cough and cold preparations with minimal improvement in her symptoms.  Outpatient Encounter Prescriptions as of 01/17/2012  Medication Sig Dispense Refill  . amLODipine (NORVASC) 5 MG tablet Take 1 tablet (5 mg total) by mouth daily.  90 tablet  1  . AMOXICILLIN PO Take by mouth as directed. PRIOR TO DENTAL ONLY.       Marland Kitchen aspirin 81 MG tablet Take 81 mg by mouth daily.        . Blood Pressure KIT 1 kit by Does not apply route as needed.  1 each  0  . Calcium Carbonate-Vitamin D (CALCIUM + D) 600-200 MG-UNIT TABS Take one tablet by mouth twice a day       . Docusate Calcium (STOOL SOFTENER PO) Take by mouth as directed       . lisinopril (PRINIVIL,ZESTRIL) 40 MG tablet Take 1 tablet (40 mg total) by mouth daily.  90 tablet  4  . meloxicam (MOBIC) 7.5 MG tablet Take 1 tablet (7.5 mg total) by mouth 2 (two) times daily.  30 tablet  2  . miconazole (MICATIN) 2 % cream Apply to affected area 2 times daily  15 g  1  . Multiple Vitamins-Minerals (PX SENIOR VITAMIN PO) Take by mouth as directed       . potassium chloride SA (KLOR-CON  M20) 20 MEQ tablet Take 1 tablet (20 mEq total) by mouth daily.  90 tablet  4  . simvastatin (ZOCOR) 20 MG tablet Take 1 tablet (20 mg total) by mouth daily.  90 tablet  4  . timolol (TIMOPTIC) 0.5 % ophthalmic solution Place 1 drop into both eyes daily.       Marland Kitchen amoxicillin-clavulanate (AUGMENTIN) 875-125 MG per tablet Take 1 tablet by mouth 2 (two) times daily.  20 tablet  0    Review of Systems  Constitutional: Negative for fever, chills, appetite change, fatigue and unexpected weight change.  HENT: Positive for ear pain, congestion and sinus pressure. Negative for sore throat, trouble swallowing, neck pain and voice change.   Eyes: Negative for visual disturbance.  Respiratory: Positive for cough. Negative for shortness of breath, wheezing and stridor.   Cardiovascular: Negative for chest pain, palpitations and leg swelling.  Gastrointestinal: Positive for abdominal pain. Negative for nausea, vomiting, diarrhea, constipation, blood in stool, abdominal distention and anal bleeding.  Genitourinary: Negative for dysuria and flank pain.  Musculoskeletal: Negative for myalgias, arthralgias and gait problem.  Skin: Positive for color change and wound. Negative for rash.  Neurological: Negative for dizziness and headaches.  Hematological: Negative for adenopathy. Does not bruise/bleed easily.  Psychiatric/Behavioral: Negative for suicidal  ideas, sleep disturbance and dysphoric mood. The patient is not nervous/anxious.    BP 138/68  Pulse 71  Temp(Src) 98.1 F (36.7 C) (Oral)  Wt 177 lb (80.287 kg)  SpO2 95%     Objective:   Physical Exam  Constitutional: She is oriented to person, place, and time. She appears well-developed and well-nourished. No distress.  HENT:  Head: Normocephalic and atraumatic.  Right Ear: External ear normal. A middle ear effusion is present.  Left Ear: External ear normal. A middle ear effusion is present.  Nose: Nose normal.  Mouth/Throat: Oropharynx is clear  and moist. No oropharyngeal exudate.  Eyes: Conjunctivae are normal. Pupils are equal, round, and reactive to light. Right eye exhibits no discharge. Left eye exhibits no discharge. No scleral icterus.  Neck: Normal range of motion. Neck supple. No tracheal deviation present. No thyromegaly present.  Cardiovascular: Normal rate, regular rhythm, normal heart sounds and intact distal pulses.  Exam reveals no gallop and no friction rub.   No murmur heard. Pulmonary/Chest: Effort normal and breath sounds normal. No respiratory distress. She has no wheezes. She has no rales. She exhibits no tenderness.  Abdominal:    Musculoskeletal: Normal range of motion. She exhibits no edema and no tenderness.  Lymphadenopathy:    She has no cervical adenopathy.  Neurological: She is alert and oriented to person, place, and time. No cranial nerve deficit. She exhibits normal muscle tone. Coordination normal.  Skin: Skin is warm and dry. No rash noted. She is not diaphoretic. No erythema. No pallor.  Psychiatric: She has a normal mood and affect. Her behavior is normal. Judgment and thought content normal.          Assessment & Plan:

## 2012-01-17 NOTE — Assessment & Plan Note (Signed)
Will treat with augmentin x 10 days. Pt will use OTC tylenol prn. Follow up in 1 month and prn.

## 2012-01-17 NOTE — Assessment & Plan Note (Signed)
Culture sent today. Improved with topical lotrimin. Will add augmentin for bacterial coverage. Will set up surgical referral to repair hernia. Question if fistula might be present leading to recurrent infection. Follow up 1 month.

## 2012-01-17 NOTE — Assessment & Plan Note (Signed)
Will set up surgical evaluation for repair.

## 2012-01-20 LAB — WOUND CULTURE: Gram Stain: NONE SEEN

## 2012-02-16 ENCOUNTER — Ambulatory Visit: Payer: Self-pay | Admitting: Surgery

## 2012-02-16 LAB — CBC WITH DIFFERENTIAL/PLATELET
Basophil #: 0 10*3/uL (ref 0.0–0.1)
Eosinophil #: 0.3 10*3/uL (ref 0.0–0.7)
Eosinophil %: 6.1 %
HCT: 36 % (ref 35.0–47.0)
HGB: 12.1 g/dL (ref 12.0–16.0)
Lymphocyte %: 30.7 %
MCHC: 33.6 g/dL (ref 32.0–36.0)
Monocyte %: 8.7 %
Neutrophil #: 3 10*3/uL (ref 1.4–6.5)
Neutrophil %: 53.8 %
Platelet: 202 10*3/uL (ref 150–440)
RDW: 14.4 % (ref 11.5–14.5)

## 2012-02-16 LAB — BASIC METABOLIC PANEL
Anion Gap: 11 (ref 7–16)
BUN: 16 mg/dL (ref 7–18)
Calcium, Total: 9.5 mg/dL (ref 8.5–10.1)
Chloride: 103 mmol/L (ref 98–107)
Co2: 29 mmol/L (ref 21–32)
Creatinine: 0.49 mg/dL — ABNORMAL LOW (ref 0.60–1.30)
EGFR (African American): >60
Glucose: 84 mg/dL (ref 65–99)
Sodium: 143 mmol/L (ref 136–145)

## 2012-02-22 ENCOUNTER — Ambulatory Visit: Payer: Medicare Other | Admitting: Internal Medicine

## 2012-02-22 ENCOUNTER — Ambulatory Visit: Payer: Self-pay | Admitting: Surgery

## 2012-03-23 ENCOUNTER — Encounter: Payer: Self-pay | Admitting: Internal Medicine

## 2012-03-23 ENCOUNTER — Ambulatory Visit (INDEPENDENT_AMBULATORY_CARE_PROVIDER_SITE_OTHER): Payer: Medicare Other | Admitting: Internal Medicine

## 2012-03-23 VITALS — BP 132/70 | HR 78 | Temp 98.3°F | Ht 62.0 in | Wt 194.0 lb

## 2012-03-23 DIAGNOSIS — E785 Hyperlipidemia, unspecified: Secondary | ICD-10-CM

## 2012-03-23 DIAGNOSIS — E669 Obesity, unspecified: Secondary | ICD-10-CM

## 2012-03-23 DIAGNOSIS — K429 Umbilical hernia without obstruction or gangrene: Secondary | ICD-10-CM

## 2012-03-23 DIAGNOSIS — Z1239 Encounter for other screening for malignant neoplasm of breast: Secondary | ICD-10-CM

## 2012-03-23 DIAGNOSIS — I10 Essential (primary) hypertension: Secondary | ICD-10-CM

## 2012-03-23 DIAGNOSIS — M1990 Unspecified osteoarthritis, unspecified site: Secondary | ICD-10-CM

## 2012-03-23 LAB — COMPREHENSIVE METABOLIC PANEL
CO2: 30 mEq/L (ref 19–32)
Calcium: 9.9 mg/dL (ref 8.4–10.5)
GFR: 109.36 mL/min (ref >60.00)
Glucose, Bld: 103 mg/dL — ABNORMAL HIGH (ref 70–99)
Sodium: 140 mEq/L (ref 135–145)
Total Bilirubin: 0.5 mg/dL (ref 0.3–1.2)
Total Protein: 7.1 g/dL (ref 6.0–8.3)

## 2012-03-23 MED ORDER — LISINOPRIL 40 MG PO TABS: 40.0000 mg | ORAL_TABLET | Freq: Every day | ORAL | Status: DC

## 2012-03-23 NOTE — Assessment & Plan Note (Signed)
Persistent symptoms, particularly in her right knee bilateral shoulders. Likely exacerbated by recent weight gain. Encouraged her to increase her physical activity, such as with a water exercise.

## 2012-03-23 NOTE — Assessment & Plan Note (Signed)
On statin medication. Will check LFTs with labs today.

## 2012-03-23 NOTE — Assessment & Plan Note (Signed)
Encouraged her to keep a food diary and increase physical activity with water exercise.

## 2012-03-23 NOTE — Assessment & Plan Note (Signed)
Blood pressure is well-controlled today. We'll continue current medications. Will check renal function with labs today.

## 2012-03-23 NOTE — Assessment & Plan Note (Signed)
Status post repair. Continues to have mild swelling in her left lower abdomen. Has followup with her surgeon next week.

## 2012-03-23 NOTE — Progress Notes (Signed)
Subjective:    Patient ID: Tina Howard, female    DOB: 08/17/1933, 78 y.o.   MRN: [id number]  HPI 78 year old female with history of hypertension, hyperlipidemia, osteoarthritis, and umbilical hernia now status post repair presents for followup. In regards to her umbilical hernia, she reports some persistent left lower abdominal wall pain. She notes that she has followup with her surgeon in one week. She denies any fever or chills.  In regards to her hypertension and hyperlipidemia, she reports full compliance with her medications. She denies any myalgia, chest pain, palpitations, or shortness of breath.  Regards to her osteoarthritis, she reports recent worsening of pain with the cooler weather, particularly in her right knee and bilateral shoulders. She is taking meloxicam with minimal improvement. She has not been physically active and notes that she has gained nearly 20 pounds.  Outpatient Encounter Prescriptions as of 03/23/2012  Medication Sig Dispense Refill  . amLODipine (NORVASC) 5 MG tablet Take 1 tablet (5 mg total) by mouth daily.  90 tablet  1  . AMOXICILLIN PO Take by mouth as directed. PRIOR TO DENTAL ONLY.       Marland Kitchen aspirin 81 MG tablet Take 81 mg by mouth daily.        . Blood Pressure KIT 1 kit by Does not apply route as needed.  1 each  0  . Calcium Carbonate-Vitamin D (CALCIUM + D) 600-200 MG-UNIT TABS Take one tablet by mouth twice a day       . Docusate Calcium (STOOL SOFTENER PO) Take by mouth as directed       . lisinopril (PRINIVIL,ZESTRIL) 40 MG tablet Take 1 tablet (40 mg total) by mouth daily.  90 tablet  4  . meloxicam (MOBIC) 7.5 MG tablet Take 1 tablet (7.5 mg total) by mouth 2 (two) times daily.  30 tablet  2  . miconazole (MICATIN) 2 % cream Apply to affected area 2 times daily  15 g  1  . Multiple Vitamins-Minerals (PX SENIOR VITAMIN PO) Take by mouth as directed       . potassium chloride SA (KLOR-CON M20) 20 MEQ tablet Take 1 tablet (20 mEq total) by mouth  daily.  90 tablet  4  . simvastatin (ZOCOR) 20 MG tablet Take 1 tablet (20 mg total) by mouth daily.  90 tablet  4  . timolol (TIMOPTIC) 0.5 % ophthalmic solution Place 1 drop into both eyes daily.       Marland Kitchen DISCONTD: lisinopril (PRINIVIL,ZESTRIL) 40 MG tablet Take 1 tablet (40 mg total) by mouth daily.  90 tablet  4      Review of Systems  Constitutional: Negative for fever, chills, appetite change, fatigue and unexpected weight change.  HENT: Negative for ear pain, congestion, sore throat, trouble swallowing, neck pain, voice change and sinus pressure.   Eyes: Negative for visual disturbance.  Respiratory: Negative for cough, shortness of breath, wheezing and stridor.   Cardiovascular: Negative for chest pain, palpitations and leg swelling.  Gastrointestinal: Positive for abdominal pain. Negative for nausea, vomiting, diarrhea, constipation, blood in stool, abdominal distention and anal bleeding.  Genitourinary: Negative for dysuria and flank pain.  Musculoskeletal: Positive for arthralgias. Negative for myalgias and gait problem.  Skin: Negative for color change and rash.  Neurological: Negative for dizziness and headaches.  Hematological: Negative for adenopathy. Does not bruise/bleed easily.  Psychiatric/Behavioral: Negative for suicidal ideas, sleep disturbance and dysphoric mood. The patient is not nervous/anxious.    BP 132/70  Pulse 78  Temp(Src) 98.3 F (36.8 C) (Oral)  Ht 5\' 2"  (1.575 m)  Wt 194 lb (87.998 kg)  BMI 35.48 kg/m2  SpO2 99%     Objective:   Physical Exam  Constitutional: She is oriented to person, place, and time. She appears well-developed and well-nourished. No distress.  HENT:  Head: Normocephalic and atraumatic.  Right Ear: External ear normal.  Left Ear: External ear normal.  Nose: Nose normal.  Mouth/Throat: Oropharynx is clear and moist. No oropharyngeal exudate.  Eyes: Conjunctivae are normal. Pupils are equal, round, and reactive to light. Right  eye exhibits no discharge. Left eye exhibits no discharge. No scleral icterus.  Neck: Normal range of motion. Neck supple. No tracheal deviation present. No thyromegaly present.  Cardiovascular: Normal rate, regular rhythm, normal heart sounds and intact distal pulses.  Exam reveals no gallop and no friction rub.   No murmur heard. Pulmonary/Chest: Effort normal and breath sounds normal. No respiratory distress. She has no wheezes. She has no rales. She exhibits no tenderness.  Abdominal: Soft. Bowel sounds are normal. She exhibits no distension and no mass. There is tenderness (left lower abdominal wall). There is no rebound and no guarding.  Musculoskeletal: She exhibits no edema and no tenderness.       Right shoulder: She exhibits decreased range of motion, tenderness and pain.       Left shoulder: She exhibits decreased range of motion and pain.       Right knee: She exhibits decreased range of motion.  Lymphadenopathy:    She has no cervical adenopathy.  Neurological: She is alert and oriented to person, place, and time. No cranial nerve deficit. She exhibits normal muscle tone. Coordination normal.  Skin: Skin is warm and dry. No rash noted. She is not diaphoretic. No erythema. No pallor.  Psychiatric: She has a normal mood and affect. Her behavior is normal. Judgment and thought content normal.          Assessment & Plan:

## 2012-05-16 ENCOUNTER — Ambulatory Visit: Payer: Self-pay | Admitting: Internal Medicine

## 2012-05-18 ENCOUNTER — Other Ambulatory Visit: Payer: Self-pay | Admitting: Internal Medicine

## 2012-05-18 MED ORDER — AMLODIPINE BESYLATE 5 MG PO TABS: 5.0000 mg | ORAL_TABLET | Freq: Every day | ORAL | Status: DC

## 2012-05-23 ENCOUNTER — Encounter: Payer: Self-pay | Admitting: Internal Medicine

## 2012-06-01 ENCOUNTER — Ambulatory Visit (INDEPENDENT_AMBULATORY_CARE_PROVIDER_SITE_OTHER)
Admission: RE | Admit: 2012-06-01 | Discharge: 2012-06-01 | Disposition: A | Discharge status: Home / Self Care | Payer: Medicare Other | Source: Ambulatory Visit | Attending: Internal Medicine | Admitting: Internal Medicine

## 2012-06-01 ENCOUNTER — Encounter: Payer: Self-pay | Admitting: Internal Medicine

## 2012-06-01 ENCOUNTER — Ambulatory Visit (INDEPENDENT_AMBULATORY_CARE_PROVIDER_SITE_OTHER): Payer: Medicare Other | Admitting: Internal Medicine

## 2012-06-01 VITALS — BP 130/80 | HR 73 | Temp 98.2°F | Ht 62.0 in | Wt 197.0 lb

## 2012-06-01 DIAGNOSIS — M25559 Pain in unspecified hip: Secondary | ICD-10-CM

## 2012-06-01 DIAGNOSIS — E669 Obesity, unspecified: Secondary | ICD-10-CM

## 2012-06-01 DIAGNOSIS — M25551 Pain in right hip: Secondary | ICD-10-CM

## 2012-06-01 NOTE — Assessment & Plan Note (Signed)
Pain after fall with asymmetry noted on exam is concerning for fracture. Will get plain xrays of right hip and pelvis. If no fracture, would favor setting up physical therapy and trying Lidoderm patch to help with pain. Follow up next week.

## 2012-06-01 NOTE — Assessment & Plan Note (Signed)
Encouraged better compliance with healthy diet and attempt to limit calories to near 1000 daily if sedentary. Follow up 1 month for wellness exam.

## 2012-06-01 NOTE — Progress Notes (Signed)
Subjective:    Patient ID: Tina Howard, female    DOB: 06/16/1933, 78 y.o.   MRN: [id number]  HPI 78 year old female with history of hypertension and osteoarthritis presents for acute visit complaining of right-sided hip and low back pain. She reports that symptoms first began approximately 6 months ago when she fell when getting out of her car. She fell onto her right hip. She did not have imaging or further evaluation at that time. Since then, she has had some pain in her right posterior hip which occasionally radiates down the back of her right leg. She has not had weakness in her right leg. She has been using nonsteroidals with no improvement.  She is also concerned today about her weight. She reports that she has tried limiting her caloric intake with no improvement in her weight. She does not get regular physical exercise. Exercise has been limited because of osteoarthritis in her knees.  Outpatient Encounter Prescriptions as of 06/01/2012  Medication Sig Dispense Refill  . amLODipine (NORVASC) 5 MG tablet Take 1 tablet (5 mg total) by mouth daily.  90 tablet  2  . AMOXICILLIN PO Take by mouth as directed. PRIOR TO DENTAL ONLY.       Marland Kitchen aspirin 81 MG tablet Take 81 mg by mouth daily.        . Blood Pressure KIT 1 kit by Does not apply route as needed.  1 each  0  . Calcium Carbonate-Vitamin D (CALCIUM + D) 600-200 MG-UNIT TABS Take one tablet by mouth twice a day       . Docusate Calcium (STOOL SOFTENER PO) Take by mouth as directed       . lisinopril (PRINIVIL,ZESTRIL) 40 MG tablet Take 1 tablet (40 mg total) by mouth daily.  90 tablet  4  . miconazole (MICATIN) 2 % cream Apply to affected area 2 times daily  15 g  1  . Multiple Vitamins-Minerals (PX SENIOR VITAMIN PO) Take by mouth as directed       . potassium chloride SA (KLOR-CON M20) 20 MEQ tablet Take 1 tablet (20 mEq total) by mouth daily.  90 tablet  4  . simvastatin (ZOCOR) 20 MG tablet Take 1 tablet (20 mg total) by mouth daily.   90 tablet  4  . timolol (TIMOPTIC) 0.5 % ophthalmic solution Place 1 drop into both eyes daily.       Marland Kitchen DISCONTD: meloxicam (MOBIC) 7.5 MG tablet Take 1 tablet (7.5 mg total) by mouth 2 (two) times daily.  30 tablet  2    Review of Systems  Constitutional: Negative for fever, chills, appetite change, fatigue and unexpected weight change.  Eyes: Negative for visual disturbance.  Respiratory: Negative for shortness of breath.   Cardiovascular: Negative for chest pain, palpitations and leg swelling.  Gastrointestinal: Negative for abdominal pain.  Genitourinary: Negative for dysuria and flank pain.  Musculoskeletal: Positive for myalgias, back pain and arthralgias. Negative for gait problem.  Skin: Negative for color change and rash.  Neurological: Negative for dizziness and headaches.  Hematological: Negative for adenopathy. Does not bruise/bleed easily.  Psychiatric/Behavioral: Negative for suicidal ideas, sleep disturbance and dysphoric mood. The patient is not nervous/anxious.    BP 130/80  Pulse 73  Temp(Src) 98.2 F (36.8 C) (Oral)  Ht 5\' 2"  (1.575 m)  Wt 197 lb (89.359 kg)  BMI 36.03 kg/m2  SpO2 94%     Objective:   Physical Exam  Constitutional: She is oriented to person, place, and time.  She appears well-developed and well-nourished. No distress.  HENT:  Head: Normocephalic and atraumatic.  Right Ear: External ear normal.  Left Ear: External ear normal.  Nose: Nose normal.  Mouth/Throat: Oropharynx is clear and moist. No oropharyngeal exudate.  Eyes: Conjunctivae are normal. Pupils are equal, round, and reactive to light. Right eye exhibits no discharge. Left eye exhibits no discharge. No scleral icterus.  Neck: Normal range of motion. Neck supple. No tracheal deviation present. No thyromegaly present.  Pulmonary/Chest: Effort normal.  Musculoskeletal: Normal range of motion. She exhibits tenderness. She exhibits no edema.       Lumbar back: She exhibits tenderness,  bony tenderness, deformity and pain. She exhibits normal range of motion.       Back:  Lymphadenopathy:    She has no cervical adenopathy.  Neurological: She is alert and oriented to person, place, and time. No cranial nerve deficit. She exhibits normal muscle tone. Coordination normal.  Skin: Skin is warm and dry. No rash noted. She is not diaphoretic. No erythema. No pallor.  Psychiatric: She has a normal mood and affect. Her behavior is normal. Judgment and thought content normal.          Assessment & Plan:

## 2012-06-09 ENCOUNTER — Encounter: Payer: Medicare Other | Admitting: Internal Medicine

## 2012-06-09 ENCOUNTER — Encounter: Payer: Self-pay | Admitting: Internal Medicine

## 2012-06-21 ENCOUNTER — Encounter: Payer: Self-pay | Admitting: Internal Medicine

## 2012-06-23 ENCOUNTER — Encounter: Payer: Medicare Other | Admitting: Internal Medicine

## 2012-06-27 ENCOUNTER — Encounter: Payer: Self-pay | Admitting: Internal Medicine

## 2012-06-27 ENCOUNTER — Ambulatory Visit (INDEPENDENT_AMBULATORY_CARE_PROVIDER_SITE_OTHER): Payer: Medicare Other | Admitting: Internal Medicine

## 2012-06-27 VITALS — BP 120/80 | HR 82 | Temp 98.6°F | Ht 62.0 in | Wt 195.5 lb

## 2012-06-27 DIAGNOSIS — M25551 Pain in right hip: Secondary | ICD-10-CM

## 2012-06-27 DIAGNOSIS — Z0000 Encounter for general adult medical examination without abnormal findings: Secondary | ICD-10-CM

## 2012-06-27 DIAGNOSIS — Z1239 Encounter for other screening for malignant neoplasm of breast: Secondary | ICD-10-CM | POA: Insufficient documentation

## 2012-06-27 DIAGNOSIS — Z23 Encounter for immunization: Secondary | ICD-10-CM

## 2012-06-27 DIAGNOSIS — M25559 Pain in unspecified hip: Secondary | ICD-10-CM

## 2012-06-27 DIAGNOSIS — E669 Obesity, unspecified: Secondary | ICD-10-CM

## 2012-06-27 LAB — POCT URINALYSIS DIPSTICK
Leukocytes, UA: NEGATIVE
Nitrite, UA: NEGATIVE
Protein, UA: NEGATIVE
Spec Grav, UA: 1.015
Urobilinogen, UA: 0.2

## 2012-06-27 LAB — COMPREHENSIVE METABOLIC PANEL
BUN: 16 mg/dL (ref 6–23)
CO2: 30 mEq/L (ref 19–32)
Calcium: 10.1 mg/dL (ref 8.4–10.5)
Chloride: 100 mEq/L (ref 96–112)
Creatinine, Ser: 0.6 mg/dL (ref 0.4–1.2)
GFR: 101.06 mL/min (ref >60.00)
Total Bilirubin: 0.5 mg/dL (ref 0.3–1.2)

## 2012-06-27 LAB — CBC WITH DIFFERENTIAL/PLATELET
Basophils Relative: 0.7 % (ref 0.0–3.0)
Eosinophils Relative: 4.5 % (ref 0.0–5.0)
HCT: 41.6 % (ref 36.0–46.0)
Hemoglobin: 13.6 g/dL (ref 12.0–15.0)
Lymphocytes Relative: 29.2 % (ref 12.0–46.0)
Monocytes Relative: 7.2 % (ref 3.0–12.0)
Neutro Abs: 3.7 10*3/uL (ref 1.4–7.7)
RBC: 4.95 Mil/uL (ref 3.87–5.11)

## 2012-06-27 LAB — LIPID PANEL
HDL: 76.9 mg/dL (ref >39.00)
Total CHOL/HDL Ratio: 3
VLDL: 36.8 mg/dL (ref 0.0–40.0)

## 2012-06-27 NOTE — Assessment & Plan Note (Signed)
Encouraged healthy diet and regular physical activity. Discussed options including water aerobics.

## 2012-06-27 NOTE — Assessment & Plan Note (Signed)
Imaging was normal except for degenerative changes in lumbar spine. Patient was evaluated by orthopedic surgery, however notes not available at time of this visit. Will request records from orthopedics. Discussed PT, however pt declined.

## 2012-06-27 NOTE — Progress Notes (Signed)
Subjective:    Patient ID: Tina Howard, female    DOB: Apr 16, 1933, 78 y.o.   MRN: [id number]  HPI The patient is here for annual Medicare wellness examination and management of other chronic and acute problems.   The risk factors are reflected in the social history.  The roster of all physicians providing medical care to patient - is listed in the Snapshot section of the chart.  Activities of daily living:  The patient is 100% independent in all ADLs: dressing, toileting, feeding as well as independent mobility  Home safety : The patient has smoke detectors in the home. They wear seatbelts.  There are no firearms at home. There is no violence in the home.Alarm system in home.   There is no risks for hepatitis, STDs or HIV. There is a history of blood transfusion x3 with birth of first child. They have no travel history to infectious disease endemic areas of the world.  The patient has seen their dentist in the last six month (Dr. Toni Arthurs). They have seen their eye doctor in the last year (Dr. Dorcas Mcmurray). Planned for cataract removal next week.  They admit to slight hearing difficulty with regard to whispered voices and in noisy rooms.  They have had audiologic testing in the last year (Dr. Andee Poles) Recommended hearing aids, but deferred for cost.  They do not  have excessive sun exposure. Discussed the need for sun protection: hats, long sleeves and use of sunscreen if there is significant sun exposure.   Diet: the importance of a healthy diet is discussed. They do have a relatively healthy diet. Increased fiber, decreased red meat.  The benefits of regular aerobic exercise were discussed. She has exercised very little in the last 2 years. Was previously walking 3 miles per day, prior to knee replacement. Planning to get back into it and start water activities.  Depression screen: there are no signs or vegative symptoms of depression- irritability, change in appetite, anhedonia,  sadness/tearfullness.  Cognitive assessment: the patient manages all their financial and personal affairs and is actively engaged. They could relate day,date,year and events; recalled 2/3 objects at 3 minutes; performed clock-face test normally.  The following portions of the patient's history were reviewed and updated as appropriate: allergies, current medications, past family history, past medical history,  past surgical history, past social history  and problem list.  Visual acuity was not assessed per patient preference since she has regular follow up with her ophthalmologist. Hearing and body mass index were assessed and reviewed.   During the course of the visit the patient was educated and counseled about appropriate screening and preventive services including : fall prevention , diabetes screening, nutrition counseling, colorectal cancer screening, and recommended immunizations.     Outpatient Encounter Prescriptions as of 06/27/2012  Medication Sig Dispense Refill  . amLODipine (NORVASC) 5 MG tablet Take 1 tablet (5 mg total) by mouth daily.  90 tablet  2  . AMOXICILLIN PO Take by mouth as directed. PRIOR TO DENTAL ONLY.       Marland Kitchen aspirin 81 MG tablet Take 81 mg by mouth daily.        . Blood Pressure KIT 1 kit by Does not apply route as needed.  1 each  0  . Calcium Carbonate-Vitamin D (CALCIUM + D) 600-200 MG-UNIT TABS Take one tablet by mouth twice a day       . Docusate Calcium (STOOL SOFTENER PO) Take by mouth as directed       .  lisinopril (PRINIVIL,ZESTRIL) 40 MG tablet Take 1 tablet (40 mg total) by mouth daily.  90 tablet  4  . miconazole (MICATIN) 2 % cream Apply to affected area 2 times daily  15 g  1  . Multiple Vitamins-Minerals (PX SENIOR VITAMIN PO) Take by mouth as directed       . potassium chloride SA (KLOR-CON M20) 20 MEQ tablet Take 1 tablet (20 mEq total) by mouth daily.  90 tablet  4  . simvastatin (ZOCOR) 20 MG tablet Take 1 tablet (20 mg total) by mouth daily.  90  tablet  4  . timolol (TIMOPTIC) 0.5 % ophthalmic solution Place 1 drop into both eyes daily.         Review of Systems  Constitutional: Negative for fever, chills, appetite change, fatigue and unexpected weight change.  HENT: Negative for ear pain, congestion, sore throat, trouble swallowing, neck pain, voice change and sinus pressure.   Eyes: Negative for visual disturbance.  Respiratory: Negative for cough, shortness of breath, wheezing and stridor.   Cardiovascular: Negative for chest pain, palpitations and leg swelling.  Gastrointestinal: Negative for nausea, vomiting, abdominal pain, diarrhea, constipation, blood in stool, abdominal distention and anal bleeding.  Genitourinary: Negative for dysuria and flank pain.  Musculoskeletal: Positive for back pain (right flank, chronic). Negative for myalgias, arthralgias and gait problem.  Skin: Negative for color change and rash.  Neurological: Negative for dizziness and headaches.  Hematological: Negative for adenopathy. Does not bruise/bleed easily.  Psychiatric/Behavioral: Negative for suicidal ideas, disturbed wake/sleep cycle and dysphoric mood. The patient is not nervous/anxious.    BP 120/80  Pulse 82  Temp 98.6 F (37 C) (Oral)  Ht 5\' 2"  (1.575 m)  Wt 195 lb 8 oz (88.678 kg)  BMI 35.76 kg/m2  SpO2 98%     Objective:   Physical Exam  Constitutional: She is oriented to person, place, and time. She appears well-developed and well-nourished. No distress.  HENT:  Head: Normocephalic and atraumatic.  Right Ear: External ear normal.  Left Ear: External ear normal.  Nose: Nose normal.  Mouth/Throat: Oropharynx is clear and moist. No oropharyngeal exudate.  Eyes: Conjunctivae are normal. Pupils are equal, round, and reactive to light. Right eye exhibits no discharge. Left eye exhibits no discharge. No scleral icterus.  Neck: Normal range of motion. Neck supple. No tracheal deviation present. No thyromegaly present.  Cardiovascular:  Normal rate, regular rhythm, normal heart sounds and intact distal pulses.  Exam reveals no gallop and no friction rub.   No murmur heard. Pulmonary/Chest: Effort normal and breath sounds normal. No accessory muscle usage. Not tachypneic. No respiratory distress. She has no decreased breath sounds. She has no wheezes. She has no rhonchi. She has no rales. She exhibits no tenderness. Right breast exhibits no inverted nipple, no mass, no nipple discharge, no skin change and no tenderness. Left breast exhibits no inverted nipple, no mass, no nipple discharge, no skin change and no tenderness. Breasts are symmetrical.  Abdominal: Soft. Bowel sounds are normal. She exhibits no distension and no mass. There is no tenderness. There is no guarding.  Musculoskeletal: Normal range of motion. She exhibits no edema and no tenderness.       Lumbar back: She exhibits pain. She exhibits normal range of motion, no tenderness, no bony tenderness, no swelling, no edema, no deformity and no spasm.  Lymphadenopathy:    She has no cervical adenopathy.  Neurological: She is alert and oriented to person, place, and time. No cranial  nerve deficit. She exhibits normal muscle tone. Coordination normal.  Skin: Skin is warm and dry. No rash noted. She is not diaphoretic. No erythema. No pallor.  Psychiatric: She has a normal mood and affect. Her behavior is normal. Judgment and thought content normal.          Assessment & Plan:

## 2012-06-27 NOTE — Assessment & Plan Note (Signed)
General exam is normal today. Health maintenance is UTD except for TDAP which was given today. Depression screen normal. Cognitive screen normal. Encouraged healthy diet and starting an exercise program to include activity such as water aerobics.

## 2012-07-05 ENCOUNTER — Ambulatory Visit: Payer: Self-pay | Admitting: Ophthalmology

## 2012-09-20 ENCOUNTER — Ambulatory Visit: Payer: Medicare Other | Admitting: Internal Medicine

## 2012-09-28 ENCOUNTER — Other Ambulatory Visit: Payer: Self-pay | Admitting: Internal Medicine

## 2012-09-28 DIAGNOSIS — I10 Essential (primary) hypertension: Secondary | ICD-10-CM

## 2012-09-28 MED ORDER — POTASSIUM CHLORIDE CRYS ER 20 MEQ PO TBCR: 20.0000 meq | EXTENDED_RELEASE_TABLET | Freq: Every day | ORAL | Status: DC

## 2012-09-28 NOTE — Telephone Encounter (Signed)
Refill request klor-con M20 tablet 90 day supply Sig: take 1 tablet (20 meq total) by mouth daily

## 2012-10-16 ENCOUNTER — Ambulatory Visit (INDEPENDENT_AMBULATORY_CARE_PROVIDER_SITE_OTHER): Payer: Medicare Other | Admitting: Internal Medicine

## 2012-10-16 ENCOUNTER — Encounter: Payer: Self-pay | Admitting: Internal Medicine

## 2012-10-16 VITALS — BP 114/78 | HR 97 | Temp 98.7°F | Ht 62.0 in | Wt 194.0 lb

## 2012-10-16 DIAGNOSIS — M25559 Pain in unspecified hip: Secondary | ICD-10-CM

## 2012-10-16 DIAGNOSIS — I10 Essential (primary) hypertension: Secondary | ICD-10-CM

## 2012-10-16 DIAGNOSIS — Z23 Encounter for immunization: Secondary | ICD-10-CM

## 2012-10-16 DIAGNOSIS — R002 Palpitations: Secondary | ICD-10-CM

## 2012-10-16 DIAGNOSIS — M25551 Pain in right hip: Secondary | ICD-10-CM

## 2012-10-16 DIAGNOSIS — Z1211 Encounter for screening for malignant neoplasm of colon: Secondary | ICD-10-CM

## 2012-10-16 LAB — COMPREHENSIVE METABOLIC PANEL
Albumin: 3.7 g/dL (ref 3.5–5.2)
Alkaline Phosphatase: 52 U/L (ref 39–117)
BUN: 18 mg/dL (ref 6–23)
Calcium: 9.5 mg/dL (ref 8.4–10.5)
Glucose, Bld: 83 mg/dL (ref 70–99)
Potassium: 3.6 mEq/L (ref 3.5–5.1)

## 2012-10-16 LAB — MICROALBUMIN / CREATININE URINE RATIO
Creatinine,U: 124.3 mg/dL
Microalb, Ur: 1.6 mg/dL (ref 0.0–1.9)

## 2012-10-16 NOTE — Progress Notes (Signed)
Subjective:    Patient ID: Tina Howard, female    DOB: 17-Jul-1933, 78 y.o.   MRN: [id number]  HPI 78 year old female with history of hypertension, hyperlipidemia presents for followup. She reports she is generally been doing well. She does note that on a few occasions recently she has had some "fluttering" of her heart which lasts for a couple of seconds then resolves. This has not generally been associated with chest pain, shortness of breath, diaphoresis, nausea. She did have one episode of very brief substernal chest tightness which occurred at rest. This resolved within a couple of seconds without intervention. She has regular followup scheduled with her cardiologist and had a stress test approximately 3 years ago which was read as normal.  In regards to osteoarthritis, she reports that pain in her right hip was significantly improved after recent course of prednisone. She continues to have some intermittent pain in her neck and back and is followed by orthopedics for this. She typically uses over-the-counter Tylenol or Robaxin as needed for muscular pain with some improvement in symptoms.  Outpatient Encounter Prescriptions as of 10/16/2012  Medication Sig Dispense Refill  . amLODipine (NORVASC) 5 MG tablet Take 1 tablet (5 mg total) by mouth daily.  90 tablet  2  . AMOXICILLIN PO Take by mouth as directed. PRIOR TO DENTAL ONLY.       Marland Kitchen aspirin 81 MG tablet Take 81 mg by mouth daily.        . Blood Pressure KIT 1 kit by Does not apply route as needed.  1 each  0  . Calcium Carbonate-Vitamin D (CALCIUM + D) 600-200 MG-UNIT TABS Take one tablet by mouth twice a day       . Docusate Calcium (STOOL SOFTENER PO) Take by mouth as directed       . lisinopril (PRINIVIL,ZESTRIL) 40 MG tablet Take 1 tablet (40 mg total) by mouth daily.  90 tablet  4  . methocarbamol (ROBAXIN) 500 MG tablet Take 500 mg by mouth 3 (three) times daily.      . miconazole (MICATIN) 2 % cream Apply to affected area 2 times  daily  15 g  1  . Multiple Vitamins-Minerals (PX SENIOR VITAMIN PO) Take by mouth as directed       . potassium chloride SA (KLOR-CON M20) 20 MEQ tablet Take 1 tablet (20 mEq total) by mouth daily.  90 tablet  3  . simvastatin (ZOCOR) 20 MG tablet Take 1 tablet (20 mg total) by mouth daily.  90 tablet  4  . timolol (TIMOPTIC) 0.5 % ophthalmic solution Place 1 drop into both eyes daily.        BP 114/78  Pulse 97  Temp 98.7 F (37.1 C) (Oral)  Ht 5\' 2"  (1.575 m)  Wt 194 lb (87.998 kg)  BMI 35.48 kg/m2  SpO2 97%  Review of Systems  Constitutional: Negative for fever, chills, appetite change, fatigue and unexpected weight change.  HENT: Negative for ear pain, congestion, sore throat, trouble swallowing, neck pain, voice change and sinus pressure.   Eyes: Negative for visual disturbance.  Respiratory: Positive for chest tightness. Negative for cough, shortness of breath, wheezing and stridor.   Cardiovascular: Positive for palpitations. Negative for chest pain and leg swelling.  Gastrointestinal: Negative for nausea, vomiting, abdominal pain, diarrhea, constipation, blood in stool, abdominal distention and anal bleeding.  Genitourinary: Negative for dysuria and flank pain.  Musculoskeletal: Positive for myalgias, back pain and arthralgias. Negative for gait problem.  Skin:  Negative for color change and rash.  Neurological: Negative for dizziness and headaches.  Hematological: Negative for adenopathy. Does not bruise/bleed easily.  Psychiatric/Behavioral: Negative for suicidal ideas, disturbed wake/sleep cycle and dysphoric mood. The patient is not nervous/anxious.        Objective:   Physical Exam  Constitutional: She is oriented to person, place, and time. She appears well-developed and well-nourished. No distress.  HENT:  Head: Normocephalic and atraumatic.  Right Ear: External ear normal.  Left Ear: External ear normal.  Nose: Nose normal.  Mouth/Throat: Oropharynx is clear and  moist. No oropharyngeal exudate.  Eyes: Conjunctivae normal are normal. Pupils are equal, round, and reactive to light. Right eye exhibits no discharge. Left eye exhibits no discharge. No scleral icterus.  Neck: Normal range of motion. Neck supple. No tracheal deviation present. No thyromegaly present.  Cardiovascular: Normal rate, regular rhythm, normal heart sounds and intact distal pulses.  Exam reveals no gallop and no friction rub.   No murmur heard. Pulmonary/Chest: Effort normal and breath sounds normal. No respiratory distress. She has no wheezes. She has no rales. She exhibits no tenderness.  Musculoskeletal: She exhibits no edema and no tenderness.       Right hip: She exhibits decreased range of motion.  Lymphadenopathy:    She has no cervical adenopathy.  Neurological: She is alert and oriented to person, place, and time. No cranial nerve deficit. She exhibits normal muscle tone. Coordination normal.  Skin: Skin is warm and dry. No rash noted. She is not diaphoretic. No erythema. No pallor.  Psychiatric: She has a normal mood and affect. Her behavior is normal. Judgment and thought content normal.          Assessment & Plan:

## 2012-10-16 NOTE — Assessment & Plan Note (Addendum)
Patient with some intermittent episodes of palpitations which are brief and not associated with diaphoresis, nausea, chest pain. Will have her monitor these episodes. They are persistent or more frequent, or if new symptoms develop, will set up followup with her cardiologist for possible Holter monitor. Will also check electrolytes and TSH with labs today.

## 2012-10-16 NOTE — Assessment & Plan Note (Signed)
Pain is improved after recent course of prednisone. Will continue to monitor. Patient will followup with orthopedics as needed.

## 2012-10-16 NOTE — Assessment & Plan Note (Signed)
Blood pressure is well-controlled today. We'll continue current medications. Will check renal function with labs today. Followup in 6 months.

## 2012-10-25 ENCOUNTER — Telehealth: Payer: Self-pay | Admitting: Internal Medicine

## 2012-10-25 ENCOUNTER — Encounter: Payer: Self-pay | Admitting: Internal Medicine

## 2012-10-25 NOTE — Telephone Encounter (Signed)
Please let pt know this

## 2012-10-25 NOTE — Telephone Encounter (Signed)
Tina Howard @ [facility] called.  Tina Howard is an established pt and is not due for colonscopy until oct of 2014 they will sent pt a reminder letter reminding her of appointment

## 2012-10-25 NOTE — Telephone Encounter (Signed)
Patient advised via telephone, update in chart.

## 2012-11-02 ENCOUNTER — Other Ambulatory Visit: Payer: Self-pay | Admitting: Internal Medicine

## 2012-11-17 ENCOUNTER — Encounter: Payer: Self-pay | Admitting: Internal Medicine

## 2012-11-17 MED ORDER — METHOCARBAMOL 500 MG PO TABS: 500.0000 mg | ORAL_TABLET | Freq: Three times a day (TID) | ORAL | Status: DC

## 2012-12-10 ENCOUNTER — Other Ambulatory Visit: Payer: Self-pay | Admitting: Internal Medicine

## 2012-12-27 HISTORY — PX: CATARACT EXTRACTION: SUR2

## 2013-01-30 DIAGNOSIS — E785 Hyperlipidemia, unspecified: Secondary | ICD-10-CM | POA: Diagnosis not present

## 2013-01-30 DIAGNOSIS — I059 Rheumatic mitral valve disease, unspecified: Secondary | ICD-10-CM | POA: Diagnosis not present

## 2013-01-30 DIAGNOSIS — R0989 Other specified symptoms and signs involving the circulatory and respiratory systems: Secondary | ICD-10-CM | POA: Diagnosis not present

## 2013-01-30 DIAGNOSIS — I10 Essential (primary) hypertension: Secondary | ICD-10-CM | POA: Diagnosis not present

## 2013-03-20 ENCOUNTER — Other Ambulatory Visit: Payer: Self-pay | Admitting: *Deleted

## 2013-03-20 DIAGNOSIS — I10 Essential (primary) hypertension: Secondary | ICD-10-CM

## 2013-03-20 MED ORDER — LISINOPRIL 40 MG PO TABS: 40.0000 mg | ORAL_TABLET | Freq: Every day | ORAL | Status: DC

## 2013-03-20 MED ORDER — SIMVASTATIN 20 MG PO TABS: ORAL_TABLET | ORAL | Status: DC

## 2013-03-20 MED ORDER — POTASSIUM CHLORIDE CRYS ER 20 MEQ PO TBCR: 20.0000 meq | EXTENDED_RELEASE_TABLET | Freq: Every day | ORAL | Status: DC

## 2013-03-20 NOTE — Telephone Encounter (Signed)
Patient dropped off a letter requesting medication refills be sent to mail order pharmacy. They have been sent per patient request.

## 2013-03-21 ENCOUNTER — Ambulatory Visit: Payer: Self-pay | Admitting: Ophthalmology

## 2013-03-21 DIAGNOSIS — H2510 Age-related nuclear cataract, unspecified eye: Secondary | ICD-10-CM | POA: Diagnosis not present

## 2013-03-21 DIAGNOSIS — Z01812 Encounter for preprocedural laboratory examination: Secondary | ICD-10-CM | POA: Diagnosis not present

## 2013-03-21 DIAGNOSIS — Z7901 Long term (current) use of anticoagulants: Secondary | ICD-10-CM | POA: Diagnosis not present

## 2013-03-21 LAB — PROTIME-INR
INR: 1
Prothrombin Time: 13.5 secs (ref 11.5–14.7)

## 2013-04-02 ENCOUNTER — Ambulatory Visit: Payer: Self-pay | Admitting: Ophthalmology

## 2013-04-02 DIAGNOSIS — M810 Age-related osteoporosis without current pathological fracture: Secondary | ICD-10-CM | POA: Diagnosis not present

## 2013-04-02 DIAGNOSIS — H9190 Unspecified hearing loss, unspecified ear: Secondary | ICD-10-CM | POA: Diagnosis not present

## 2013-04-02 DIAGNOSIS — H2510 Age-related nuclear cataract, unspecified eye: Secondary | ICD-10-CM | POA: Diagnosis not present

## 2013-04-02 DIAGNOSIS — R609 Edema, unspecified: Secondary | ICD-10-CM | POA: Diagnosis not present

## 2013-04-02 DIAGNOSIS — R011 Cardiac murmur, unspecified: Secondary | ICD-10-CM | POA: Diagnosis not present

## 2013-04-02 DIAGNOSIS — M129 Arthropathy, unspecified: Secondary | ICD-10-CM | POA: Diagnosis not present

## 2013-04-02 DIAGNOSIS — I10 Essential (primary) hypertension: Secondary | ICD-10-CM | POA: Diagnosis not present

## 2013-04-02 DIAGNOSIS — Z888 Allergy status to other drugs, medicaments and biological substances status: Secondary | ICD-10-CM | POA: Diagnosis not present

## 2013-04-02 DIAGNOSIS — H269 Unspecified cataract: Secondary | ICD-10-CM | POA: Diagnosis not present

## 2013-04-02 DIAGNOSIS — E7800 Pure hypercholesterolemia, unspecified: Secondary | ICD-10-CM | POA: Diagnosis not present

## 2013-04-02 DIAGNOSIS — I499 Cardiac arrhythmia, unspecified: Secondary | ICD-10-CM | POA: Diagnosis not present

## 2013-04-02 DIAGNOSIS — Z79899 Other long term (current) drug therapy: Secondary | ICD-10-CM | POA: Diagnosis not present

## 2013-05-18 ENCOUNTER — Ambulatory Visit: Payer: Self-pay | Admitting: Internal Medicine

## 2013-05-18 DIAGNOSIS — Z1231 Encounter for screening mammogram for malignant neoplasm of breast: Secondary | ICD-10-CM | POA: Diagnosis not present

## 2013-06-14 ENCOUNTER — Ambulatory Visit (INDEPENDENT_AMBULATORY_CARE_PROVIDER_SITE_OTHER): Payer: Medicare Other | Admitting: Internal Medicine

## 2013-06-14 ENCOUNTER — Encounter: Payer: Self-pay | Admitting: Internal Medicine

## 2013-06-14 VITALS — BP 140/86 | HR 66 | Temp 98.1°F | Ht 61.75 in | Wt 198.0 lb

## 2013-06-14 DIAGNOSIS — E785 Hyperlipidemia, unspecified: Secondary | ICD-10-CM

## 2013-06-14 DIAGNOSIS — M62830 Muscle spasm of back: Secondary | ICD-10-CM

## 2013-06-14 DIAGNOSIS — I10 Essential (primary) hypertension: Secondary | ICD-10-CM | POA: Diagnosis not present

## 2013-06-14 DIAGNOSIS — M5380 Other specified dorsopathies, site unspecified: Secondary | ICD-10-CM

## 2013-06-14 LAB — MICROALBUMIN / CREATININE URINE RATIO
Creatinine,U: 87.7 mg/dL
Microalb Creat Ratio: 0.6 mg/g (ref 0.0–30.0)
Microalb, Ur: 0.5 mg/dL (ref 0.0–1.9)

## 2013-06-14 LAB — COMPREHENSIVE METABOLIC PANEL
CO2: 30 mEq/L (ref 19–32)
Creatinine, Ser: 0.8 mg/dL (ref 0.4–1.2)
GFR: 75.9 mL/min (ref >60.00)
Glucose, Bld: 123 mg/dL — ABNORMAL HIGH (ref 70–99)
Total Bilirubin: 0.5 mg/dL (ref 0.3–1.2)

## 2013-06-14 LAB — LIPID PANEL: Cholesterol: 165 mg/dL (ref 0–200)

## 2013-06-14 MED ORDER — CYCLOBENZAPRINE HCL 5 MG PO TABS: 5.0000 mg | ORAL_TABLET | Freq: Three times a day (TID) | ORAL | Status: DC | PRN

## 2013-06-14 NOTE — Assessment & Plan Note (Signed)
BP Readings from Last 3 Encounters:  06/14/13 140/86  10/16/12 114/78  06/27/12 120/80   BP well controlled on current medication. Will continue. Will check renal function with labs.

## 2013-06-14 NOTE — Progress Notes (Signed)
Subjective:    Patient ID: Tina Howard, female    DOB: [birth year], 79 y.o.   MRN: [id number]  HPI 79YO female with HTN, HL, obesity presents for follow up. Generally doing well. Compliant with medications. Concerned today about several days of mid back pain, which is described as aching pain that radiates around to right side. No h/o trauma or new activities. No weakness or numbness noted. Taking Ibuprofen with minimal improvement. Pain also improved with rest. No dyspnea, cough, chest pain.  Outpatient Encounter Prescriptions as of 06/14/2013  Medication Sig Dispense Refill  . amLODipine (NORVASC) 5 MG tablet Take 1 tablet (5 mg total) by mouth daily.  90 tablet  2  . aspirin 81 MG tablet Take 81 mg by mouth daily.        . Calcium Carbonate-Vitamin D (CALCIUM + D) 600-200 MG-UNIT TABS Take one tablet by mouth twice a day       . Docusate Calcium (STOOL SOFTENER PO) Take by mouth as directed       . Multiple Vitamins-Minerals (PX SENIOR VITAMIN PO) Take by mouth as directed       . potassium chloride SA (KLOR-CON M20) 20 MEQ tablet Take 1 tablet (20 mEq total) by mouth daily.  90 tablet  0  . simvastatin (ZOCOR) 20 MG tablet TAKE 1 TABLET (20 MG TOTAL) BY MOUTH DAILY.  90 tablet  0  . timolol (TIMOPTIC) 0.5 % ophthalmic solution Place 1 drop into both eyes daily.       . Blood Pressure KIT 1 kit by Does not apply route as needed.  1 each  0  . cyclobenzaprine (FLEXERIL) 5 MG tablet Take 1-2 tablets (5-10 mg total) by mouth 3 (three) times daily as needed for muscle spasms.  90 tablet  1  . lisinopril (PRINIVIL,ZESTRIL) 40 MG tablet Take 1 tablet (40 mg total) by mouth daily.  90 tablet  0   No facility-administered encounter medications on file as of 06/14/2013.   BP 140/86  Pulse 66  Temp(Src) 98.1 F (36.7 C) (Oral)  Ht 5' 1.75" (1.568 m)  Wt 198 lb (89.812 kg)  BMI 36.53 kg/m2  SpO2 97%  Review of Systems  Constitutional: Negative for fever, chills, appetite change, fatigue and  unexpected weight change.  HENT: Negative for ear pain, congestion, sore throat, trouble swallowing, neck pain, voice change and sinus pressure.   Eyes: Negative for visual disturbance.  Respiratory: Negative for cough, shortness of breath, wheezing and stridor.   Cardiovascular: Negative for chest pain, palpitations and leg swelling.  Gastrointestinal: Negative for nausea, vomiting, abdominal pain, diarrhea, constipation, blood in stool, abdominal distention and anal bleeding.  Genitourinary: Negative for dysuria and flank pain.  Musculoskeletal: Positive for myalgias and back pain. Negative for arthralgias and gait problem.  Skin: Negative for color change and rash.  Neurological: Negative for dizziness and headaches.  Hematological: Negative for adenopathy. Does not bruise/bleed easily.  Psychiatric/Behavioral: Negative for suicidal ideas, sleep disturbance and dysphoric mood. The patient is not nervous/anxious.        Objective:   Physical Exam  Constitutional: She is oriented to person, place, and time. She appears well-developed and well-nourished. No distress.  HENT:  Head: Normocephalic and atraumatic.  Right Ear: External ear normal.  Left Ear: External ear normal.  Nose: Nose normal.  Mouth/Throat: Oropharynx is clear and moist. No oropharyngeal exudate.  Eyes: Conjunctivae are normal. Pupils are equal, round, and reactive to light. Right eye exhibits no discharge. Left eye  exhibits no discharge. No scleral icterus.  Neck: Normal range of motion. Neck supple. No tracheal deviation present. No thyromegaly present.  Cardiovascular: Normal rate, regular rhythm, normal heart sounds and intact distal pulses.  Exam reveals no gallop and no friction rub.   No murmur heard. Pulmonary/Chest: Effort normal and breath sounds normal. No accessory muscle usage. Not tachypneic. No respiratory distress. She has no decreased breath sounds. She has no wheezes. She has no rhonchi. She has no  rales. She exhibits no tenderness.  Musculoskeletal: Normal range of motion. She exhibits no edema and no tenderness.       Thoracic back: She exhibits tenderness, pain and spasm. She exhibits normal range of motion and no bony tenderness.  Lymphadenopathy:    She has no cervical adenopathy.  Neurological: She is alert and oriented to person, place, and time. No cranial nerve deficit. She exhibits normal muscle tone. Coordination normal.  Skin: Skin is warm and dry. No rash noted. She is not diaphoretic. No erythema. No pallor.  Psychiatric: She has a normal mood and affect. Her behavior is normal. Judgment and thought content normal.          Assessment & Plan:

## 2013-06-14 NOTE — Assessment & Plan Note (Signed)
Symptoms and exam most consistent with muscular spasm. No improvement with Ibuprofen alone. Will try adding Flexeril. Follow up prn if symptoms not improving.

## 2013-06-14 NOTE — Assessment & Plan Note (Signed)
Will check lipids and LFTs with labs. Continue Simvastatin. 

## 2013-06-21 DIAGNOSIS — M545 Low back pain: Secondary | ICD-10-CM | POA: Diagnosis not present

## 2013-06-21 DIAGNOSIS — Z96659 Presence of unspecified artificial knee joint: Secondary | ICD-10-CM | POA: Diagnosis not present

## 2013-06-28 ENCOUNTER — Encounter: Payer: Self-pay | Admitting: Internal Medicine

## 2013-07-09 DIAGNOSIS — M79609 Pain in unspecified limb: Secondary | ICD-10-CM | POA: Diagnosis not present

## 2013-07-09 DIAGNOSIS — B351 Tinea unguium: Secondary | ICD-10-CM | POA: Diagnosis not present

## 2013-07-21 ENCOUNTER — Other Ambulatory Visit: Payer: Self-pay | Admitting: Internal Medicine

## 2013-07-21 DIAGNOSIS — I10 Essential (primary) hypertension: Secondary | ICD-10-CM

## 2013-07-24 MED ORDER — POTASSIUM CHLORIDE CRYS ER 20 MEQ PO TBCR: 20.0000 meq | EXTENDED_RELEASE_TABLET | Freq: Every day | ORAL | Status: DC

## 2013-07-25 DIAGNOSIS — H4010X0 Unspecified open-angle glaucoma, stage unspecified: Secondary | ICD-10-CM | POA: Diagnosis not present

## 2013-08-07 DIAGNOSIS — E785 Hyperlipidemia, unspecified: Secondary | ICD-10-CM | POA: Diagnosis not present

## 2013-08-07 DIAGNOSIS — I059 Rheumatic mitral valve disease, unspecified: Secondary | ICD-10-CM | POA: Diagnosis not present

## 2013-08-07 DIAGNOSIS — I4949 Other premature depolarization: Secondary | ICD-10-CM | POA: Diagnosis not present

## 2013-08-07 DIAGNOSIS — I10 Essential (primary) hypertension: Secondary | ICD-10-CM | POA: Diagnosis not present

## 2013-08-10 ENCOUNTER — Encounter: Payer: Self-pay | Admitting: Internal Medicine

## 2013-08-21 DIAGNOSIS — H9209 Otalgia, unspecified ear: Secondary | ICD-10-CM | POA: Diagnosis not present

## 2013-08-21 DIAGNOSIS — H903 Sensorineural hearing loss, bilateral: Secondary | ICD-10-CM | POA: Diagnosis not present

## 2013-08-21 DIAGNOSIS — H60509 Unspecified acute noninfective otitis externa, unspecified ear: Secondary | ICD-10-CM | POA: Diagnosis not present

## 2013-08-22 DIAGNOSIS — M779 Enthesopathy, unspecified: Secondary | ICD-10-CM | POA: Diagnosis not present

## 2013-08-22 DIAGNOSIS — M109 Gout, unspecified: Secondary | ICD-10-CM | POA: Diagnosis not present

## 2013-09-17 ENCOUNTER — Encounter: Payer: Medicare Other | Admitting: Internal Medicine

## 2013-09-18 ENCOUNTER — Encounter: Payer: Medicare Other | Admitting: Internal Medicine

## 2013-09-27 ENCOUNTER — Encounter: Payer: Self-pay | Admitting: Internal Medicine

## 2013-10-01 ENCOUNTER — Ambulatory Visit: Payer: Self-pay | Admitting: Podiatry

## 2013-10-17 ENCOUNTER — Encounter: Payer: Medicare Other | Admitting: Internal Medicine

## 2013-10-26 DIAGNOSIS — Z86010 Personal history of colonic polyps: Secondary | ICD-10-CM | POA: Diagnosis not present

## 2013-10-30 ENCOUNTER — Encounter: Payer: Self-pay | Admitting: Internal Medicine

## 2013-10-30 ENCOUNTER — Ambulatory Visit (INDEPENDENT_AMBULATORY_CARE_PROVIDER_SITE_OTHER): Payer: Medicare Other | Admitting: Internal Medicine

## 2013-10-30 VITALS — BP 130/80 | HR 76 | Temp 97.8°F | Ht 62.0 in | Wt 183.0 lb

## 2013-10-30 DIAGNOSIS — I10 Essential (primary) hypertension: Secondary | ICD-10-CM | POA: Diagnosis not present

## 2013-10-30 DIAGNOSIS — Z23 Encounter for immunization: Secondary | ICD-10-CM | POA: Diagnosis not present

## 2013-10-30 DIAGNOSIS — R51 Headache: Secondary | ICD-10-CM | POA: Diagnosis not present

## 2013-10-30 DIAGNOSIS — Z0000 Encounter for general adult medical examination without abnormal findings: Secondary | ICD-10-CM | POA: Diagnosis not present

## 2013-10-30 DIAGNOSIS — E785 Hyperlipidemia, unspecified: Secondary | ICD-10-CM | POA: Diagnosis not present

## 2013-10-30 DIAGNOSIS — R42 Dizziness and giddiness: Secondary | ICD-10-CM | POA: Diagnosis not present

## 2013-10-30 DIAGNOSIS — E669 Obesity, unspecified: Secondary | ICD-10-CM

## 2013-10-30 LAB — CBC WITH DIFFERENTIAL/PLATELET
Basophils Absolute: 0 10*3/uL (ref 0.0–0.1)
Eosinophils Absolute: 0.3 10*3/uL (ref 0.0–0.7)
HCT: 36.5 % (ref 36.0–46.0)
Lymphocytes Relative: 20.8 % (ref 12.0–46.0)
Lymphs Abs: 1.4 10*3/uL (ref 0.7–4.0)
MCHC: 33.9 g/dL (ref 30.0–36.0)
Monocytes Relative: 6.7 % (ref 3.0–12.0)
Platelets: 258 10*3/uL (ref 150.0–400.0)
RDW: 14 % (ref 11.5–14.6)

## 2013-10-30 LAB — COMPREHENSIVE METABOLIC PANEL
Albumin: 4 g/dL (ref 3.5–5.2)
CO2: 32 mEq/L (ref 19–32)
GFR: 121.07 mL/min (ref >60.00)
Glucose, Bld: 118 mg/dL — ABNORMAL HIGH (ref 70–99)
Potassium: 3.5 mEq/L (ref 3.5–5.1)
Sodium: 140 mEq/L (ref 135–145)
Total Protein: 6.6 g/dL (ref 6.0–8.3)

## 2013-10-30 LAB — TSH: TSH: 1.58 u[IU]/mL (ref 0.35–5.50)

## 2013-10-30 LAB — LIPID PANEL
Total CHOL/HDL Ratio: 3
VLDL: 29.8 mg/dL (ref 0.0–40.0)

## 2013-10-30 LAB — VITAMIN B12: Vitamin B-12: 803 pg/mL (ref 211–911)

## 2013-10-30 LAB — MICROALBUMIN / CREATININE URINE RATIO: Microalb Creat Ratio: 0.5 mg/g (ref 0.0–30.0)

## 2013-10-30 NOTE — Assessment & Plan Note (Signed)
BP Readings from Last 3 Encounters:  10/30/13 130/80  06/14/13 140/86  10/16/12 114/78   BP well controlled on current medications. Will continue.

## 2013-10-30 NOTE — Assessment & Plan Note (Signed)
Will check lipids with labs today.  

## 2013-10-30 NOTE — Progress Notes (Signed)
Pre-visit discussion using our clinic review tool. No additional management support is needed unless otherwise documented below in the visit note.  

## 2013-10-30 NOTE — Assessment & Plan Note (Signed)
Recent symptoms of left sided headache and lightheadedness. Exam normal today. Will check labs including CBC, CMP, TSH, B12. Will plan to repeat carotid dopplers and check MRI brain given new onset symptoms.

## 2013-10-30 NOTE — Assessment & Plan Note (Signed)
General medical exam including breast exam normal today. PAP and pelvic deferred given pt age and preference. Health maintenance is UTD including immunizations. Will check labs including CMP, CBC, lipids, TSH and B12. Encouraged healthy diet and regular physical activity. Follow up in 1 month to follow up headaches and lightheadedness.

## 2013-10-30 NOTE — Progress Notes (Signed)
Subjective:    Patient ID: Tina Howard, female    DOB: 1933/11/29, 79 y.o.   MRN: [id number]  HPI The patient is here for annual Medicare wellness examination and management of other chronic and acute problems.   The risk factors are reflected in the social history.  The roster of all physicians providing medical care to patient - is listed in the Snapshot section of the chart.  Activities of daily living:  The patient is 100% independent in all ADLs: dressing, toileting, feeding as well as independent mobility  Home safety : The patient has smoke detectors in the home. They wear seatbelts.  There are no firearms at home. There is no violence in the home.Alarm system in home.   There is no risks for hepatitis, STDs or HIV. There is a history of blood transfusion x3 with birth of first child. They have no travel history to infectious disease endemic areas of the world.  The patient has seen their dentist in the last six month (Dr. Toni Arthurs). They have seen their eye doctor in the last year (Dr. Dorcas Mcmurray). S/p cataract removal.  They admit to slight hearing difficulty with regard to whispered voices and in noisy rooms.  They have had audiologic testing in the last year (Dr. Andee Poles). Recently started using hearing aids, August 2014. Pt notes improvement in hearing, for example at church to hear homily, however some situations such as when listening to music, the sound may be too loud.  They do not  have excessive sun exposure. Discussed the need for sun protection: hats, long sleeves and use of sunscreen if there is significant sun exposure.  Dermatologist - Dr. Wende Neighbors  Diet: the importance of a healthy diet is discussed. They do have a relatively healthy diet. Increased fiber, decreased red meat.  The benefits of regular aerobic exercise were discussed. Limited exercise recently. Planning to get back into walking, recently bought some shoes.  Depression screen: there are no signs or  vegative symptoms of depression- irritability, change in appetite, anhedonia, sadness/tearfullness.  Cognitive assessment: the patient manages all their financial and personal affairs and is actively engaged. They could relate day,date,year and events.  The following portions of the patient's history were reviewed and updated as appropriate: allergies, current medications, past family history, past medical history,  past surgical history, past social history  and problem list.  Visual acuity was not assessed per patient preference since she has regular follow up with her ophthalmologist. Hearing and body mass index were assessed and reviewed.   During the course of the visit the patient was educated and counseled about appropriate screening and preventive services including : fall prevention , diabetes screening, nutrition counseling, colorectal cancer screening, and recommended immunizations.    She is also concerned today about recent episodes of lightheadedness, described as feeling sensation of being "off kilter." These occur randomly, over the last few weeks, both sitting and standing. They are brief lasting only a few seconds. They are not associated with chest pain, diaphoresis, nausea, palpitations.  She has also recent episodes of left occipital headache, which occurs randomly. This does not coincide with episodes of lightheadedness. It is described as pressure or stabbing pain behind the left eye and above the left eye. It does not radiate. No associated sinus congestion, fever, neck pain. Symptoms resolve after several minutes without intervention. No visual changes, photo or phonophobia noted. No h/o migraine headache.   Outpatient Prescriptions Prior to Visit  Medication Sig Dispense Refill  . amLODipine (  NORVASC) 5 MG tablet Take 1 tablet (5 mg total) by mouth daily.  90 tablet  2  . aspirin 81 MG tablet Take 81 mg by mouth daily.        . Blood Pressure KIT 1 kit by Does not apply  route as needed.  1 each  0  . Calcium Carbonate-Vitamin D (CALCIUM + D) 600-200 MG-UNIT TABS Take one tablet by mouth twice a day       . Docusate Calcium (STOOL SOFTENER PO) Take by mouth as directed       . lisinopril (PRINIVIL,ZESTRIL) 40 MG tablet Take 1 tablet (40 mg total) by mouth daily.  90 tablet  0  . Multiple Vitamins-Minerals (PX SENIOR VITAMIN PO) Take by mouth as directed       . potassium chloride SA (KLOR-CON M20) 20 MEQ tablet Take 1 tablet (20 mEq total) by mouth daily.  90 tablet  0  . simvastatin (ZOCOR) 20 MG tablet TAKE 1 TABLET (20 MG TOTAL) BY MOUTH DAILY.  90 tablet  0  . timolol (TIMOPTIC) 0.5 % ophthalmic solution Place 1 drop into both eyes daily.       . cyclobenzaprine (FLEXERIL) 5 MG tablet Take 1-2 tablets (5-10 mg total) by mouth 3 (three) times daily as needed for muscle spasms.  90 tablet  1   No facility-administered medications prior to visit.   BP 130/80  Pulse 76  Temp(Src) 97.8 F (36.6 C) (Oral)  Ht 5\' 2"  (1.575 m)  Wt 183 lb (83.008 kg)  BMI 33.46 kg/m2  SpO2 98%   Review of Systems  Constitutional: Negative for fever, chills, appetite change, fatigue and unexpected weight change.  HENT: Negative for congestion, ear pain, sinus pressure, sore throat, trouble swallowing and voice change.   Eyes: Negative for visual disturbance.  Respiratory: Negative for cough, shortness of breath, wheezing and stridor.   Cardiovascular: Negative for chest pain, palpitations and leg swelling.  Gastrointestinal: Negative for nausea, vomiting, abdominal pain, diarrhea, constipation, blood in stool, abdominal distention and anal bleeding.  Genitourinary: Negative for dysuria and flank pain.  Musculoskeletal: Negative for arthralgias, gait problem, myalgias and neck pain.  Skin: Negative for color change and rash.  Neurological: Positive for light-headedness and headaches. Negative for dizziness, tremors, seizures, syncope, speech difficulty, weakness and  numbness.  Hematological: Negative for adenopathy. Does not bruise/bleed easily.  Psychiatric/Behavioral: Negative for suicidal ideas, sleep disturbance and dysphoric mood. The patient is not nervous/anxious.        Objective:   Physical Exam  Constitutional: She is oriented to person, place, and time. She appears well-developed and well-nourished. No distress.  HENT:  Head: Normocephalic and atraumatic.  Right Ear: External ear normal.  Left Ear: External ear normal.  Nose: Nose normal.  Mouth/Throat: Oropharynx is clear and moist. No oropharyngeal exudate.  Eyes: Conjunctivae are normal. Pupils are equal, round, and reactive to light. Right eye exhibits no discharge. Left eye exhibits no discharge. No scleral icterus.  Neck: Normal range of motion. Neck supple. No tracheal deviation present. No thyromegaly present.  Cardiovascular: Normal rate, regular rhythm, normal heart sounds and intact distal pulses.  Exam reveals no gallop and no friction rub.   No murmur heard. Pulmonary/Chest: Effort normal and breath sounds normal. No accessory muscle usage. Not tachypneic. No respiratory distress. She has no decreased breath sounds. She has no wheezes. She has no rhonchi. She has no rales. She exhibits no tenderness. Right breast exhibits no inverted nipple, no mass, no  nipple discharge, no skin change and no tenderness. Left breast exhibits no inverted nipple, no mass, no nipple discharge, no skin change and no tenderness. Breasts are symmetrical.  Abdominal: Soft. Bowel sounds are normal. She exhibits no distension and no mass. There is no tenderness. There is no rebound and no guarding.  Musculoskeletal: Normal range of motion. She exhibits no edema and no tenderness.  Lymphadenopathy:    She has no cervical adenopathy.  Neurological: She is alert and oriented to person, place, and time. No cranial nerve deficit. She exhibits normal muscle tone. Coordination normal.  Skin: Skin is warm and  dry. No rash noted. She is not diaphoretic. No erythema. No pallor.  Psychiatric: She has a normal mood and affect. Her behavior is normal. Judgment and thought content normal.          Assessment & Plan:

## 2013-10-30 NOTE — Assessment & Plan Note (Signed)
Wt Readings from Last 3 Encounters:  10/30/13 183 lb (83.008 kg)  06/14/13 198 lb (89.812 kg)  10/16/12 194 lb (87.998 kg)   Encouraged continued effort at healthy diet and regular physical activity.

## 2013-10-30 NOTE — Assessment & Plan Note (Signed)
Intermittent symptoms of lightheadedness. No focal neurologic symptoms. No findings to suggest orthostasis. Exam normal today. Reviewed carotid doppler from 2012, will plan to repeat this. Will also get MRI brain for further evaluation given new onset symptoms of headache and lightheadedness.

## 2013-11-01 DIAGNOSIS — I6529 Occlusion and stenosis of unspecified carotid artery: Secondary | ICD-10-CM | POA: Diagnosis not present

## 2013-11-08 ENCOUNTER — Telehealth: Payer: Self-pay | Admitting: Internal Medicine

## 2013-11-08 ENCOUNTER — Ambulatory Visit: Payer: Self-pay | Admitting: Internal Medicine

## 2013-11-08 DIAGNOSIS — R42 Dizziness and giddiness: Secondary | ICD-10-CM | POA: Diagnosis not present

## 2013-11-08 DIAGNOSIS — R930 Abnormal findings on diagnostic imaging of skull and head, not elsewhere classified: Secondary | ICD-10-CM | POA: Diagnosis not present

## 2013-11-08 NOTE — Telephone Encounter (Signed)
Patient informed and verbalized understanding. She has not had anymore episodes since her visit with you. However she would like for you to send her a Mychart message explaining to her what do you mean small vessel disease.

## 2013-11-08 NOTE — Telephone Encounter (Signed)
Left message to call back  

## 2013-11-08 NOTE — Telephone Encounter (Signed)
MRI of the brain from 11/13 was normal except for small vessel disease. No findings to explain lightheadedness or headache. Have symptoms improved?

## 2013-11-10 ENCOUNTER — Other Ambulatory Visit: Payer: Self-pay | Admitting: Internal Medicine

## 2013-11-10 DIAGNOSIS — I10 Essential (primary) hypertension: Secondary | ICD-10-CM

## 2013-11-12 ENCOUNTER — Ambulatory Visit: Payer: Self-pay | Admitting: Podiatry

## 2013-11-12 MED ORDER — POTASSIUM CHLORIDE CRYS ER 20 MEQ PO TBCR: 20.0000 meq | EXTENDED_RELEASE_TABLET | Freq: Every day | ORAL | Status: DC

## 2013-11-19 ENCOUNTER — Ambulatory Visit (INDEPENDENT_AMBULATORY_CARE_PROVIDER_SITE_OTHER): Payer: Medicare Other | Admitting: Podiatry

## 2013-11-19 ENCOUNTER — Encounter: Payer: Self-pay | Admitting: Podiatry

## 2013-11-19 VITALS — BP 114/61 | HR 79 | Resp 16 | Ht 62.0 in | Wt 183.0 lb

## 2013-11-19 DIAGNOSIS — L84 Corns and callosities: Secondary | ICD-10-CM

## 2013-11-19 NOTE — Progress Notes (Signed)
Tina Howard presents today with a chief complaint of painful callus sub-fourth metatarsal head left foot.  Objective: Pulses remain palpable to the left foot. There is no erythema edema cellulitis drainage or odor noted. Neurologic sensorium is intact. She has a reactive hyperkeratosis sub-fourth metatarsal head of the left foot. There is no signs of infection associated with this.  Assessment: Plantar flexed fourth metatarsal left foot with reactive hyperkeratosis sub-fourth left.  Plan: Debridement of reactive hyperkeratosis for her today we'll followup with her on an as-needed basis.

## 2013-11-27 ENCOUNTER — Encounter: Payer: Self-pay | Admitting: Internal Medicine

## 2013-11-28 ENCOUNTER — Ambulatory Visit (INDEPENDENT_AMBULATORY_CARE_PROVIDER_SITE_OTHER): Payer: Medicare Other | Admitting: Adult Health

## 2013-11-28 ENCOUNTER — Encounter: Payer: Self-pay | Admitting: Emergency Medicine

## 2013-11-28 ENCOUNTER — Encounter: Payer: Self-pay | Admitting: Adult Health

## 2013-11-28 VITALS — BP 132/70 | HR 70 | Temp 98.1°F | Resp 14 | Wt 201.0 lb

## 2013-11-28 DIAGNOSIS — R05 Cough: Secondary | ICD-10-CM

## 2013-11-28 MED ORDER — AMOXICILLIN-POT CLAVULANATE 875-125 MG PO TABS: 1.0000 | ORAL_TABLET | Freq: Two times a day (BID) | ORAL | Status: DC

## 2013-11-28 MED ORDER — GUAIFENESIN-CODEINE 100-10 MG/5ML PO SOLN: 5.0000 mL | Freq: Three times a day (TID) | ORAL | Status: DC | PRN

## 2013-11-28 NOTE — Patient Instructions (Signed)
  Start Augmentin twice a day for 10 days. Robitussin AC for severe cough. This has codeine and will cause sedation. Avoid driving while taking this medication.   Bronchitis Bronchitis is the body's way of reacting to injury and/or infection (inflammation) of the bronchi. Bronchi are the air tubes that extend from the windpipe into the lungs. If the inflammation becomes severe, it may cause shortness of breath. CAUSES  Inflammation may be caused by:  A virus.  Germs (bacteria).  Dust.  Allergens.  Pollutants and many other irritants. The cells lining the bronchial tree are covered with tiny hairs (cilia). These constantly beat upward, away from the lungs, toward the mouth. This keeps the lungs free of pollutants. When these cells become too irritated and are unable to do their job, mucus begins to develop. This causes the characteristic cough of bronchitis. The cough clears the lungs when the cilia are unable to do their job. Without either of these protective mechanisms, the mucus would settle in the lungs. Then you would develop pneumonia. Smoking is a common cause of bronchitis and can contribute to pneumonia. Stopping this habit is the single most important thing you can do to help yourself. TREATMENT   Your caregiver may prescribe an antibiotic if the cough is caused by bacteria. Also, medicines that open up your airways make it easier to breathe. Your caregiver may also recommend or prescribe an expectorant. It will loosen the mucus to be coughed up. Only take over-the-counter or prescription medicines for pain, discomfort, or fever as directed by your caregiver.  Removing whatever causes the problem (smoking, for example) is critical to preventing the problem from getting worse.  Cough suppressants may be prescribed for relief of cough symptoms.  Inhaled medicines may be prescribed to help with symptoms now and to help prevent problems from returning.  For those with recurrent  (chronic) bronchitis, there may be a need for steroid medicines. SEEK IMMEDIATE MEDICAL CARE IF:   During treatment, you develop more pus-like mucus (purulent sputum).  You have a fever.  You become progressively more ill.  You have increased difficulty breathing, wheezing, or shortness of breath. It is necessary to seek immediate medical care if you are elderly or sick from any other disease. MAKE SURE YOU:   Understand these instructions.  Will watch your condition.  Will get help right away if you are not doing well or get worse. Document Released: 12/13/2005 Document Revised: 08/15/2013 Document Reviewed: 08/07/2013 [facility] Patient Information 2014 [place], [place].

## 2013-11-28 NOTE — Assessment & Plan Note (Signed)
Start Augmentin x 10 days. Robitussin AC for cough.

## 2013-11-28 NOTE — Progress Notes (Signed)
   Subjective:    Patient ID: Tina Howard, female    DOB: 02-01-1933, 79 y.o.   MRN: [id number]  HPI  Cough x 3 weeks. Last week started to have green productive cough. She denies having a fever although she felt generalized malaise.  Review of Systems  HENT: Positive for congestion.   Respiratory: Positive for cough, chest tightness and wheezing.   Cardiovascular: Negative.        Objective:   Physical Exam  Constitutional: She is oriented to person, place, and time. No distress.  HENT:  Pharyngeal erythema  Cardiovascular: Normal rate and regular rhythm.   Murmur heard. Pulmonary/Chest: Effort normal and breath sounds normal. No respiratory distress. She has no wheezes.  Neurological: She is alert and oriented to person, place, and time.  Psychiatric: She has a normal mood and affect. Her behavior is normal. Judgment and thought content normal.          Assessment & Plan:

## 2013-11-28 NOTE — Progress Notes (Signed)
Pre visit review using our clinic review tool, if applicable. No additional management support is needed unless otherwise documented below in the visit note. 

## 2013-12-11 ENCOUNTER — Encounter: Payer: Self-pay | Admitting: Internal Medicine

## 2013-12-22 ENCOUNTER — Other Ambulatory Visit: Payer: Self-pay | Admitting: Internal Medicine

## 2013-12-22 DIAGNOSIS — I10 Essential (primary) hypertension: Secondary | ICD-10-CM

## 2013-12-24 ENCOUNTER — Ambulatory Visit: Payer: Self-pay | Admitting: Unknown Physician Specialty

## 2013-12-24 DIAGNOSIS — Z09 Encounter for follow-up examination after completed treatment for conditions other than malignant neoplasm: Secondary | ICD-10-CM | POA: Diagnosis not present

## 2013-12-24 DIAGNOSIS — E785 Hyperlipidemia, unspecified: Secondary | ICD-10-CM | POA: Diagnosis not present

## 2013-12-24 DIAGNOSIS — Z860100 Personal history of colon polyps, unspecified: Secondary | ICD-10-CM | POA: Diagnosis not present

## 2013-12-24 DIAGNOSIS — Z881 Allergy status to other antibiotic agents status: Secondary | ICD-10-CM | POA: Diagnosis not present

## 2013-12-24 DIAGNOSIS — Z7982 Long term (current) use of aspirin: Secondary | ICD-10-CM | POA: Diagnosis not present

## 2013-12-24 DIAGNOSIS — Z6836 Body mass index (BMI) 36.0-36.9, adult: Secondary | ICD-10-CM | POA: Diagnosis not present

## 2013-12-24 DIAGNOSIS — K5520 Angiodysplasia of colon without hemorrhage: Secondary | ICD-10-CM | POA: Diagnosis not present

## 2013-12-24 DIAGNOSIS — E669 Obesity, unspecified: Secondary | ICD-10-CM | POA: Diagnosis not present

## 2013-12-24 DIAGNOSIS — K648 Other hemorrhoids: Secondary | ICD-10-CM | POA: Diagnosis not present

## 2013-12-24 DIAGNOSIS — Z888 Allergy status to other drugs, medicaments and biological substances status: Secondary | ICD-10-CM | POA: Diagnosis not present

## 2013-12-24 DIAGNOSIS — Z79899 Other long term (current) drug therapy: Secondary | ICD-10-CM | POA: Diagnosis not present

## 2013-12-24 DIAGNOSIS — I10 Essential (primary) hypertension: Secondary | ICD-10-CM | POA: Diagnosis not present

## 2013-12-24 MED ORDER — LISINOPRIL 40 MG PO TABS: 40.0000 mg | ORAL_TABLET | Freq: Every day | ORAL | Status: DC

## 2013-12-24 MED ORDER — SIMVASTATIN 20 MG PO TABS: ORAL_TABLET | ORAL | Status: DC

## 2014-01-23 DIAGNOSIS — H4010X0 Unspecified open-angle glaucoma, stage unspecified: Secondary | ICD-10-CM | POA: Diagnosis not present

## 2014-03-08 ENCOUNTER — Other Ambulatory Visit: Payer: Self-pay | Admitting: Internal Medicine

## 2014-03-08 DIAGNOSIS — I10 Essential (primary) hypertension: Secondary | ICD-10-CM

## 2014-03-08 MED ORDER — POTASSIUM CHLORIDE CRYS ER 20 MEQ PO TBCR: 20.0000 meq | EXTENDED_RELEASE_TABLET | Freq: Every day | ORAL | Status: DC

## 2014-03-20 ENCOUNTER — Ambulatory Visit (INDEPENDENT_AMBULATORY_CARE_PROVIDER_SITE_OTHER): Payer: Medicare Other | Admitting: Podiatry

## 2014-03-20 ENCOUNTER — Ambulatory Visit (INDEPENDENT_AMBULATORY_CARE_PROVIDER_SITE_OTHER): Payer: Medicare Other

## 2014-03-20 ENCOUNTER — Encounter: Payer: Self-pay | Admitting: Podiatry

## 2014-03-20 VITALS — BP 144/85 | HR 66 | Resp 16

## 2014-03-20 DIAGNOSIS — M778 Other enthesopathies, not elsewhere classified: Secondary | ICD-10-CM

## 2014-03-20 DIAGNOSIS — M7750 Other enthesopathy of unspecified foot: Secondary | ICD-10-CM

## 2014-03-20 DIAGNOSIS — M21612 Bunion of left foot: Secondary | ICD-10-CM

## 2014-03-20 DIAGNOSIS — M21619 Bunion of unspecified foot: Secondary | ICD-10-CM

## 2014-03-20 DIAGNOSIS — Q828 Other specified congenital malformations of skin: Secondary | ICD-10-CM | POA: Diagnosis not present

## 2014-03-20 DIAGNOSIS — M779 Enthesopathy, unspecified: Secondary | ICD-10-CM

## 2014-03-20 NOTE — Progress Notes (Signed)
Callus on the left foot and i  Want him to check the bunion.  Objective: Pulses are stable she is alert and oriented x3. Pulses are strongly palpable to the left foot. She has marked arthritis bilateral foot. She has pain on palpation and range of motion of the fifth metatarsal phalangeal joint of the left foot. With a plantar flexed fourth metatarsal she is resulting in a reactive hyperkeratotic lesion sub-fourth left.  Assessment: Plantar flexed fourth metatarsal left foot with callus. Capsulitis fifth metatarsophalangeal joint left foot.  Plan: Discussed etiology pathology conservative versus surgical therapies injected para-articular about the fifth metatarsal of the left foot with dexamethasone. I also debrided the reactive hyperkeratosis sub-fourth metatarsal head of the left foot. Discussed etiology pathology conservative versus surgical therapies for the osteoarthritis at this point. I will followup with her as needed.

## 2014-05-02 ENCOUNTER — Ambulatory Visit: Payer: Medicare Other | Admitting: Internal Medicine

## 2014-05-08 ENCOUNTER — Ambulatory Visit (INDEPENDENT_AMBULATORY_CARE_PROVIDER_SITE_OTHER): Payer: Medicare Other | Admitting: Internal Medicine

## 2014-05-08 ENCOUNTER — Encounter: Payer: Self-pay | Admitting: Internal Medicine

## 2014-05-08 ENCOUNTER — Telehealth: Payer: Self-pay | Admitting: Internal Medicine

## 2014-05-08 VITALS — BP 122/74 | HR 70 | Temp 97.8°F | Wt 198.0 lb

## 2014-05-08 DIAGNOSIS — R1032 Left lower quadrant pain: Secondary | ICD-10-CM

## 2014-05-08 DIAGNOSIS — Z23 Encounter for immunization: Secondary | ICD-10-CM

## 2014-05-08 DIAGNOSIS — R7309 Other abnormal glucose: Secondary | ICD-10-CM | POA: Diagnosis not present

## 2014-05-08 DIAGNOSIS — E785 Hyperlipidemia, unspecified: Secondary | ICD-10-CM | POA: Diagnosis not present

## 2014-05-08 DIAGNOSIS — G8929 Other chronic pain: Secondary | ICD-10-CM

## 2014-05-08 DIAGNOSIS — I10 Essential (primary) hypertension: Secondary | ICD-10-CM | POA: Diagnosis not present

## 2014-05-08 DIAGNOSIS — L989 Disorder of the skin and subcutaneous tissue, unspecified: Secondary | ICD-10-CM

## 2014-05-08 DIAGNOSIS — M549 Dorsalgia, unspecified: Secondary | ICD-10-CM

## 2014-05-08 DIAGNOSIS — R739 Hyperglycemia, unspecified: Secondary | ICD-10-CM

## 2014-05-08 DIAGNOSIS — R7303 Prediabetes: Secondary | ICD-10-CM | POA: Insufficient documentation

## 2014-05-08 LAB — COMPREHENSIVE METABOLIC PANEL
ALT: 17 U/L (ref 0–35)
AST: 24 U/L (ref 0–37)
Albumin: 3.9 g/dL (ref 3.5–5.2)
Alkaline Phosphatase: 53 U/L (ref 39–117)
BUN: 15 mg/dL (ref 6–23)
CALCIUM: 9 mg/dL (ref 8.4–10.5)
CHLORIDE: 105 meq/L (ref 96–112)
CO2: 28 mEq/L (ref 19–32)
CREATININE: 0.5 mg/dL (ref 0.4–1.2)
GFR: 126.51 mL/min (ref >60.00)
Glucose, Bld: 110 mg/dL — ABNORMAL HIGH (ref 70–99)
Potassium: 3.3 mEq/L — ABNORMAL LOW (ref 3.5–5.1)
SODIUM: 139 meq/L (ref 135–145)
TOTAL PROTEIN: 6.4 g/dL (ref 6.0–8.3)
Total Bilirubin: 0.6 mg/dL (ref 0.2–1.2)

## 2014-05-08 LAB — LIPID PANEL
CHOL/HDL RATIO: 3
Cholesterol: 178 mg/dL (ref 0–200)
HDL: 65.1 mg/dL (ref >39.00)
LDL Cholesterol: 93 mg/dL (ref 0–99)
Triglycerides: 98 mg/dL (ref 0.0–149.0)
VLDL: 19.6 mg/dL (ref 0.0–40.0)

## 2014-05-08 LAB — MICROALBUMIN / CREATININE URINE RATIO
CREATININE, U: 52.7 mg/dL
MICROALB/CREAT RATIO: 0.6 mg/g (ref 0.0–30.0)
Microalb, Ur: 0.3 mg/dL (ref 0.0–1.9)

## 2014-05-08 LAB — HEMOGLOBIN A1C: Hgb A1c MFr Bld: 5.9 % (ref 4.6–6.5)

## 2014-05-08 NOTE — Telephone Encounter (Signed)
Relevant patient education assigned to patient using Emmi. ° °

## 2014-05-08 NOTE — Progress Notes (Signed)
Pre visit review using our clinic review tool, if applicable. No additional management support is needed unless otherwise documented below in the visit note. 

## 2014-05-08 NOTE — Assessment & Plan Note (Signed)
Lab Results  Component Value Date   LDLCALC 93 05/08/2014   Lipids well controlled on Simvastatin. LFTs normal.

## 2014-05-08 NOTE — Assessment & Plan Note (Signed)
Muscular pain in lower back. Discussed use of prn pain medication including Tylenol or Ibuprofen. Also discussed referral for PT and core strengthening exercises. Discussed possible referral to back pain specialist. Pt would like to hold off for now. She will call if symptoms are worsening.

## 2014-05-08 NOTE — Progress Notes (Signed)
Subjective:    Patient ID: Tina Howard, female    DOB: 1933/04/26, 80 y.o.   MRN: [id number]  HPI 80YO female presents for follow up.  Planning to go on vacation 5/22-5/26 in [place].  Burning sensation in left lower abdomen noted at night, at site of previous shingles. Not taking any medication for this.   Also has some chronic lower back aching pain, exacerbated by prolonged standing. Not currently taking any medication on a regular basis for this.  Review of Systems  Constitutional: Negative for fever, chills, appetite change, fatigue and unexpected weight change.  HENT: Negative for congestion, ear pain, sinus pressure, sore throat, trouble swallowing and voice change.   Eyes: Negative for visual disturbance.  Respiratory: Negative for cough, shortness of breath, wheezing and stridor.   Cardiovascular: Negative for chest pain, palpitations and leg swelling.  Gastrointestinal: Positive for abdominal pain (left lower abdominal wall). Negative for nausea, vomiting, diarrhea, constipation, blood in stool, abdominal distention and anal bleeding.  Genitourinary: Negative for dysuria and flank pain.  Musculoskeletal: Positive for arthralgias, back pain and myalgias. Negative for gait problem and neck pain.  Skin: Negative for color change and rash.  Neurological: Negative for dizziness and headaches.  Hematological: Negative for adenopathy. Does not bruise/bleed easily.  Psychiatric/Behavioral: Negative for suicidal ideas, sleep disturbance and dysphoric mood. The patient is not nervous/anxious.        Objective:    BP 122/74  Pulse 70  Temp(Src) 97.8 F (36.6 C) (Oral)  Wt 198 lb (89.812 kg)  SpO2 95% Physical Exam  Constitutional: She is oriented to person, place, and time. She appears well-developed and well-nourished. No distress.  HENT:  Head: Normocephalic and atraumatic.  Right Ear: External ear normal.  Left Ear: External ear normal.  Nose: Nose normal.  Mouth/Throat:  Oropharynx is clear and moist. No oropharyngeal exudate.  Eyes: Conjunctivae are normal. Pupils are equal, round, and reactive to light. Right eye exhibits no discharge. Left eye exhibits no discharge. No scleral icterus.  Neck: Normal range of motion. Neck supple. No tracheal deviation present. No thyromegaly present.  Cardiovascular: Normal rate, regular rhythm, normal heart sounds and intact distal pulses.  Exam reveals no gallop and no friction rub.   No murmur heard. Pulmonary/Chest: Effort normal and breath sounds normal. No accessory muscle usage. Not tachypneic. No respiratory distress. She has no decreased breath sounds. She has no wheezes. She has no rhonchi. She has no rales. She exhibits no tenderness.  Abdominal: Soft. Bowel sounds are normal. She exhibits no distension and no mass. There is no tenderness. There is no rebound and no guarding.  Musculoskeletal: Normal range of motion. She exhibits no edema.       Lumbar back: She exhibits tenderness (lumbar paraspinal muscles) and pain. She exhibits normal range of motion, no bony tenderness and no edema.  Lymphadenopathy:    She has no cervical adenopathy.  Neurological: She is alert and oriented to person, place, and time. No cranial nerve deficit. She exhibits normal muscle tone. Coordination normal.  Skin: Skin is warm and dry. Lesion noted. No rash noted. She is not diaphoretic. No erythema. No pallor.     Psychiatric: She has a normal mood and affect. Her behavior is normal. Judgment and thought content normal.          Assessment & Plan:   Problem List Items Addressed This Visit   Abdominal pain, left lower quadrant     Symptoms of left lower abdominal wall pain  most consistent with post-herpetic neuralgia. Discussed starting medication for this, such as neurontin, but pt would like to hold off for now. She will call if symptoms are worsening.    Back pain, chronic - Primary     Muscular pain in lower back. Discussed  use of prn pain medication including Tylenol or Ibuprofen. Also discussed referral for PT and core strengthening exercises. Discussed possible referral to back pain specialist. Pt would like to hold off for now. She will call if symptoms are worsening.    Elevated blood sugar     Previous BG noted to be elevated in the past. A1c with labs today normal. Encouraged healthy diet and regular physical activity.    Relevant Orders      Hemoglobin A1c (Completed)   Hyperlipidemia      Lab Results  Component Value Date   LDLCALC 93 05/08/2014   Lipids well controlled on Simvastatin. LFTs normal.    Relevant Orders      Lipid panel (Completed)   Hypertension      BP Readings from Last 3 Encounters:  05/08/14 122/74  03/20/14 144/85  11/28/13 132/70   BP well controlled on Lisinopril and amlodipine. Will continue.    Relevant Orders      Microalbumin / creatinine urine ratio (Completed)      Comprehensive metabolic panel (Completed)   Skin lesion of right arm     Skin lesion right arm darkly pigmented, concerning for melanoma. Will set up dermatology evaluation for biopsy.    Relevant Orders      Ambulatory referral to Dermatology    Other Visit Diagnoses   Need for pneumococcal vaccination        Relevant Orders       Pneumococcal conjugate vaccine 13-valent (Completed)        Return in about 6 months (around 11/08/2014) for Wellness Visit.

## 2014-05-08 NOTE — Assessment & Plan Note (Signed)
Skin lesion right arm darkly pigmented, concerning for melanoma. Will set up dermatology evaluation for biopsy.

## 2014-05-08 NOTE — Assessment & Plan Note (Signed)
BP Readings from Last 3 Encounters:  05/08/14 122/74  03/20/14 144/85  11/28/13 132/70   BP well controlled on Lisinopril and amlodipine. Will continue.

## 2014-05-08 NOTE — Assessment & Plan Note (Signed)
Previous BG noted to be elevated in the past. A1c with labs today normal. Encouraged healthy diet and regular physical activity.

## 2014-05-08 NOTE — Assessment & Plan Note (Signed)
Symptoms of left lower abdominal wall pain most consistent with post-herpetic neuralgia. Discussed starting medication for this, such as neurontin, but pt would like to hold off for now. She will call if symptoms are worsening.

## 2014-05-08 NOTE — Telephone Encounter (Signed)
At checkout pt wants to inform Dr. Gilford Rile that she has scheduled her mammogram at [facility] [place] 6/3.

## 2014-05-09 ENCOUNTER — Ambulatory Visit (INDEPENDENT_AMBULATORY_CARE_PROVIDER_SITE_OTHER): Payer: Medicare Other | Admitting: Podiatry

## 2014-05-09 VITALS — BP 161/85 | HR 73 | Resp 16

## 2014-05-09 DIAGNOSIS — M778 Other enthesopathies, not elsewhere classified: Secondary | ICD-10-CM

## 2014-05-09 DIAGNOSIS — M109 Gout, unspecified: Secondary | ICD-10-CM | POA: Diagnosis not present

## 2014-05-09 DIAGNOSIS — M7750 Other enthesopathy of unspecified foot: Secondary | ICD-10-CM | POA: Diagnosis not present

## 2014-05-09 DIAGNOSIS — M779 Enthesopathy, unspecified: Secondary | ICD-10-CM

## 2014-05-09 NOTE — Progress Notes (Signed)
She presents today complaining of pain to the right first metatarsophalangeal joint. States it is been red recently but is now looking much better. She's also complaining of a painful reactive hyperkeratotic lesion plantar aspect of the left forefoot.  Objective: Vital signs are stable she is alert and oriented x3. Pulses are palpable bilateral. Mild tenderness on palpation first metatarsophalangeal joint of the right foot. Mild erythema no edema no cellulitis no drainage no odor. She has a painful callus to the plantar aspect of the fourth metatarsal head of the left foot. This is secondary to a plantar flexed fourth metatarsal.  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  Plan: Send for blood work arthritic profile. Debrided reactive hyperkeratosis.

## 2014-05-10 LAB — SEDIMENTATION RATE: SED RATE: 5 mm/h (ref 0–40)

## 2014-05-10 LAB — URIC ACID: Uric Acid: 4.3 mg/dL (ref 2.5–7.1)

## 2014-05-10 LAB — RHEUMATOID FACTOR: Rhuematoid fact SerPl-aCnc: 9.1 IU/mL (ref 0.0–13.9)

## 2014-05-10 LAB — ANA: Anti Nuclear Antibody(ANA): NEGATIVE

## 2014-05-10 LAB — C-REACTIVE PROTEIN: CRP: 1.4 mg/L (ref 0.0–4.9)

## 2014-05-13 ENCOUNTER — Telehealth: Payer: Self-pay | Admitting: *Deleted

## 2014-05-13 NOTE — Telephone Encounter (Signed)
CALLED AND LEFT MESSAGE FOR PATIENT REGARDING HER BLOOD WORK

## 2014-05-13 NOTE — Telephone Encounter (Signed)
Message copied by Dierdre Searles on Mon May 13, 2014  2:32 PM ------      Message from: HYATT, MAX T      Created: Mon May 13, 2014  7:59 AM       Arthritic profile looks good.  I will follow up with her at her scheduled appointment. ------

## 2014-05-28 ENCOUNTER — Telehealth: Payer: Self-pay | Admitting: Internal Medicine

## 2014-05-28 DIAGNOSIS — N644 Mastodynia: Secondary | ICD-10-CM

## 2014-05-28 NOTE — Telephone Encounter (Signed)
OK. I will place an order for bilateral diagnostic mammogram.  Lorriane Shire - Can you please schedule this?

## 2014-05-28 NOTE — Telephone Encounter (Signed)
Patient stopped the office after appt at [facility]. She stated that she didn't have her mammogram and she was told that because she has been having pain under the left arm they are requesting that she have a diagnostic mammogram. Patient was told that Dr. Gilford Rile will have to order the test. Please notify patient of appt date and time.msn

## 2014-05-28 NOTE — Telephone Encounter (Signed)
Sent mychart, notifying order was placed and [facility] will call with appt

## 2014-06-13 ENCOUNTER — Ambulatory Visit: Payer: Medicare Other | Admitting: Podiatry

## 2014-06-13 ENCOUNTER — Ambulatory Visit: Payer: Self-pay | Admitting: Internal Medicine

## 2014-06-13 DIAGNOSIS — I789 Disease of capillaries, unspecified: Secondary | ICD-10-CM | POA: Diagnosis not present

## 2014-06-13 DIAGNOSIS — D239 Other benign neoplasm of skin, unspecified: Secondary | ICD-10-CM | POA: Diagnosis not present

## 2014-06-13 DIAGNOSIS — Z86018 Personal history of other benign neoplasm: Secondary | ICD-10-CM

## 2014-06-13 DIAGNOSIS — L578 Other skin changes due to chronic exposure to nonionizing radiation: Secondary | ICD-10-CM | POA: Diagnosis not present

## 2014-06-13 DIAGNOSIS — Z1231 Encounter for screening mammogram for malignant neoplasm of breast: Secondary | ICD-10-CM | POA: Diagnosis not present

## 2014-06-13 DIAGNOSIS — D485 Neoplasm of uncertain behavior of skin: Secondary | ICD-10-CM | POA: Diagnosis not present

## 2014-06-13 HISTORY — DX: Personal history of other benign neoplasm: Z86.018

## 2014-06-13 LAB — HM MAMMOGRAPHY: HM MAMMO: NORMAL

## 2014-06-20 ENCOUNTER — Ambulatory Visit (INDEPENDENT_AMBULATORY_CARE_PROVIDER_SITE_OTHER): Payer: Medicare Other | Admitting: Podiatry

## 2014-06-20 VITALS — BP 144/74 | HR 86 | Resp 16

## 2014-06-20 DIAGNOSIS — M778 Other enthesopathies, not elsewhere classified: Secondary | ICD-10-CM

## 2014-06-20 DIAGNOSIS — Q828 Other specified congenital malformations of skin: Secondary | ICD-10-CM | POA: Diagnosis not present

## 2014-06-20 DIAGNOSIS — B351 Tinea unguium: Secondary | ICD-10-CM

## 2014-06-20 DIAGNOSIS — M79609 Pain in unspecified limb: Secondary | ICD-10-CM | POA: Diagnosis not present

## 2014-06-20 DIAGNOSIS — M79676 Pain in unspecified toe(s): Secondary | ICD-10-CM

## 2014-06-20 DIAGNOSIS — M779 Enthesopathy, unspecified: Principal | ICD-10-CM

## 2014-06-20 DIAGNOSIS — B353 Tinea pedis: Secondary | ICD-10-CM

## 2014-06-20 NOTE — Progress Notes (Signed)
She presents today with a chief complaint of painful elongated toenails and painful corns and calluses bilateral.  Objective: Vital signs are stable she is alert and oriented x3. Pulses are strongly palpable bilateral. Nails are thick yellow dystrophic with mycotic multiple areas of porokeratotic lesion plantar aspect the bilateral foot.  Assessment: Pain in limb secondary to onychomycosis 1 through 5 bilateral and porokeratosis bilateral.  Plan: Debridement of all reactive hyperkeratotic tissue debridement of nails 1 through 5 bilateral covered service secondary to pain.

## 2014-06-25 ENCOUNTER — Encounter: Payer: Self-pay | Admitting: Internal Medicine

## 2014-06-27 ENCOUNTER — Encounter: Payer: Self-pay | Admitting: Internal Medicine

## 2014-07-01 ENCOUNTER — Other Ambulatory Visit: Payer: Self-pay | Admitting: *Deleted

## 2014-07-01 ENCOUNTER — Other Ambulatory Visit: Payer: Self-pay | Admitting: Internal Medicine

## 2014-07-01 MED ORDER — LISINOPRIL 40 MG PO TABS: 40.0000 mg | ORAL_TABLET | Freq: Every day | ORAL | Status: DC

## 2014-08-01 DIAGNOSIS — H4010X0 Unspecified open-angle glaucoma, stage unspecified: Secondary | ICD-10-CM | POA: Diagnosis not present

## 2014-08-22 ENCOUNTER — Ambulatory Visit (INDEPENDENT_AMBULATORY_CARE_PROVIDER_SITE_OTHER): Payer: Medicare Other | Admitting: Podiatry

## 2014-08-22 ENCOUNTER — Encounter: Payer: Self-pay | Admitting: Podiatry

## 2014-08-22 DIAGNOSIS — B351 Tinea unguium: Secondary | ICD-10-CM | POA: Diagnosis not present

## 2014-08-22 DIAGNOSIS — M79676 Pain in unspecified toe(s): Secondary | ICD-10-CM

## 2014-08-22 DIAGNOSIS — Q828 Other specified congenital malformations of skin: Secondary | ICD-10-CM | POA: Diagnosis not present

## 2014-08-22 DIAGNOSIS — M79609 Pain in unspecified limb: Secondary | ICD-10-CM | POA: Diagnosis not present

## 2014-08-22 NOTE — Progress Notes (Signed)
She presents today chief complaint of painful elongated toenails as well as thick corns and calluses plantar aspect the bilateral foot.  Objective: Nails are thick yellow dystrophic onychomycotic and painful palpation. Reactive hyperkeratosis plantar aspect of the bilateral foot.  Assessment: Pain in limb secondary to onychomycosis and reactive hyperkeratosis bilateral.  Plan: Discussed etiology pathology conservative therapies at this point in time debridement all reactive hyperkeratosis and debridement nails 1 through 5 bilateral covered service secondary to pain.

## 2014-09-09 ENCOUNTER — Other Ambulatory Visit: Payer: Self-pay | Admitting: Internal Medicine

## 2014-10-23 ENCOUNTER — Ambulatory Visit (INDEPENDENT_AMBULATORY_CARE_PROVIDER_SITE_OTHER): Payer: Medicare Other | Admitting: Podiatry

## 2014-10-23 DIAGNOSIS — M779 Enthesopathy, unspecified: Secondary | ICD-10-CM

## 2014-10-23 DIAGNOSIS — B351 Tinea unguium: Secondary | ICD-10-CM

## 2014-10-23 DIAGNOSIS — M79676 Pain in unspecified toe(s): Secondary | ICD-10-CM | POA: Diagnosis not present

## 2014-10-23 DIAGNOSIS — M778 Other enthesopathies, not elsewhere classified: Secondary | ICD-10-CM

## 2014-10-23 DIAGNOSIS — M7751 Other enthesopathy of right foot: Secondary | ICD-10-CM | POA: Diagnosis not present

## 2014-10-23 NOTE — Progress Notes (Signed)
She presents today for follow-up of her painful calluses plantar aspect of the bilateral foot as well as her painfully elongated toenails. She also wants to know if I can inject her dorsal aspect of her left foot due to the osteoarthritis.  Objective: Vital signs are stable she is alert and oriented 3. She denies any changes in her past medical history medications allergies surgery social history. Pulses remain palpable bilateral. Nails are thick yellow dystrophic onychomycotic. Porokeratosis plantar aspect of the bilateral foot is present. An osteoarthritis and capsulitis dorsal aspect of the left foot. Dorsal spurring is noted. It is warm to the touch.  Assessment: Osteoarthritis capsulitis dorsal aspect left foot. Pain in limb segment onychomycosis and calluses bilateral foot.  Plan: Debridement of all reactive hyperkeratosis bilateral. [place] of nails 1 through 5 bilateral is covered service secondary to pain. N inject the dorsal aspect of the left foot with Kenalog and local anesthetic. She tolerates procedures well follow up with her on an as-needed basis.

## 2014-11-13 ENCOUNTER — Ambulatory Visit (INDEPENDENT_AMBULATORY_CARE_PROVIDER_SITE_OTHER): Payer: Medicare Other | Admitting: Internal Medicine

## 2014-11-13 ENCOUNTER — Ambulatory Visit (INDEPENDENT_AMBULATORY_CARE_PROVIDER_SITE_OTHER): Payer: Medicare Other | Admitting: *Deleted

## 2014-11-13 ENCOUNTER — Encounter: Payer: Self-pay | Admitting: Internal Medicine

## 2014-11-13 VITALS — BP 154/88 | HR 65 | Temp 97.7°F | Ht 61.75 in | Wt 192.2 lb

## 2014-11-13 DIAGNOSIS — E669 Obesity, unspecified: Secondary | ICD-10-CM | POA: Diagnosis not present

## 2014-11-13 DIAGNOSIS — I10 Essential (primary) hypertension: Secondary | ICD-10-CM

## 2014-11-13 DIAGNOSIS — Z23 Encounter for immunization: Secondary | ICD-10-CM

## 2014-11-13 DIAGNOSIS — Z0000 Encounter for general adult medical examination without abnormal findings: Secondary | ICD-10-CM

## 2014-11-13 DIAGNOSIS — S61219A Laceration without foreign body of unspecified finger without damage to nail, initial encounter: Secondary | ICD-10-CM

## 2014-11-13 LAB — COMPREHENSIVE METABOLIC PANEL
ALT: 20 U/L (ref 0–35)
AST: 19 U/L (ref 0–37)
Albumin: 4 g/dL (ref 3.5–5.2)
Alkaline Phosphatase: 53 U/L (ref 39–117)
BILIRUBIN TOTAL: 0.7 mg/dL (ref 0.2–1.2)
BUN: 16 mg/dL (ref 6–23)
CO2: 28 mEq/L (ref 19–32)
Calcium: 9 mg/dL (ref 8.4–10.5)
Chloride: 106 mEq/L (ref 96–112)
Creatinine, Ser: 0.5 mg/dL (ref 0.4–1.2)
GFR: 118.13 mL/min (ref >60.00)
Glucose, Bld: 98 mg/dL (ref 70–99)
Potassium: 3.6 mEq/L (ref 3.5–5.1)
SODIUM: 143 meq/L (ref 135–145)
TOTAL PROTEIN: 6.4 g/dL (ref 6.0–8.3)

## 2014-11-13 LAB — CBC WITH DIFFERENTIAL/PLATELET
Basophils Absolute: 0 10*3/uL (ref 0.0–0.1)
Basophils Relative: 0.7 % (ref 0.0–3.0)
EOS PCT: 2.6 % (ref 0.0–5.0)
Eosinophils Absolute: 0.2 10*3/uL (ref 0.0–0.7)
HCT: 37 % (ref 36.0–46.0)
Hemoglobin: 12.4 g/dL (ref 12.0–15.0)
Lymphocytes Relative: 23.3 % (ref 12.0–46.0)
Lymphs Abs: 1.4 10*3/uL (ref 0.7–4.0)
MCHC: 33.7 g/dL (ref 30.0–36.0)
MCV: 81.9 fl (ref 78.0–100.0)
Monocytes Absolute: 0.5 10*3/uL (ref 0.1–1.0)
Monocytes Relative: 7.6 % (ref 3.0–12.0)
Neutro Abs: 3.9 10*3/uL (ref 1.4–7.7)
Neutrophils Relative %: 65.8 % (ref 43.0–77.0)
Platelets: 226 10*3/uL (ref 150.0–400.0)
RBC: 4.52 Mil/uL (ref 3.87–5.11)
RDW: 14 % (ref 11.5–15.5)
WBC: 6 10*3/uL (ref 4.0–10.5)

## 2014-11-13 LAB — LIPID PANEL
Cholesterol: 183 mg/dL (ref 0–200)
HDL: 70.5 mg/dL (ref >39.00)
LDL CALC: 94 mg/dL (ref 0–99)
NonHDL: 112.5
Total CHOL/HDL Ratio: 3
Triglycerides: 91 mg/dL (ref 0.0–149.0)
VLDL: 18.2 mg/dL (ref 0.0–40.0)

## 2014-11-13 LAB — HEMOGLOBIN A1C: Hgb A1c MFr Bld: 5.6 % (ref 4.6–6.5)

## 2014-11-13 LAB — MICROALBUMIN / CREATININE URINE RATIO
CREATININE, U: 75.9 mg/dL
Microalb Creat Ratio: 1.4 mg/g (ref 0.0–30.0)
Microalb, Ur: 1.1 mg/dL (ref 0.0–1.9)

## 2014-11-13 LAB — TSH: TSH: 1.2 u[IU]/mL (ref 0.35–4.50)

## 2014-11-13 LAB — VITAMIN D 25 HYDROXY (VIT D DEFICIENCY, FRACTURES): VITD: 37.23 ng/mL (ref 30.00–100.00)

## 2014-11-13 NOTE — Progress Notes (Signed)
Pre visit review using our clinic review tool, if applicable. No additional management support is needed unless otherwise documented below in the visit note. 

## 2014-11-13 NOTE — Progress Notes (Signed)
Subjective:    Patient ID: Tina Howard, female    DOB: March 31, 1933, 80 y.o.   MRN: [id number]  HPI The patient is here for annual Medicare wellness examination and management of other chronic and acute problems.   The risk factors are reflected in the social history.  The roster of all physicians providing medical care to patient - is listed in the Snapshot section of the chart.  Activities of daily living:  The patient is 100% independent in all ADLs: dressing, toileting, feeding as well as independent mobility. Lives in a home. Son moved back in with her. Has a cat in her home, [place].  Home safety : The patient has smoke detectors in the home. They wear seatbelts.  There are no firearms at home. There is no violence in the home. Alarm system in home.   There is no risks for hepatitis, STDs or HIV. There is a history of blood transfusion x3 with birth of first child. They have no travel history to infectious disease endemic areas of the world.  The patient has seen their dentist in the last six month (Dr. Toy Cookey). They have seen their eye doctor in the last year (Dr. Thomasene Ripple). S/p cataract removal.  They admit to slight hearing difficulty with regard to whispered voices and in noisy rooms.  They have had audiologic testing in the last year (Dr. Pryor Ochoa). Wears hearing aids.  They do not  have excessive sun exposure. Discussed the need for sun protection: hats, long sleeves and use of sunscreen if there is significant sun exposure.  Dermatologist - Dr. Nehemiah Massed  Diet: the importance of a healthy diet is discussed. They do have a relatively healthy diet. Increased fiber, decreased red meat.  The benefits of regular aerobic exercise were discussed. Limited exercise recently. Has not been walking much.Notes limited time.  Depression screen: there are no signs or vegative symptoms of depression- irritability, change in appetite, anhedonia, sadness/tearfullness.  Cognitive assessment:  the patient manages all their financial and personal affairs and is actively engaged. They could relate day,date,year and events.  The following portions of the patient's history were reviewed and updated as appropriate: allergies, current medications, past family history, past medical history,  past surgical history, past social history  and problem list.  Visual acuity was not assessed per patient preference since she has regular follow up with her ophthalmologist. Hearing and body mass index were assessed and reviewed.   During the course of the visit the patient was educated and counseled about appropriate screening and preventive services including : fall prevention , diabetes screening, nutrition counseling, colorectal cancer screening, and recommended immunizations.    FOLLOW UP: HTN - Compliant with medication. No recent chest pain. Occasional mild headache, but no recent change.  ACUTE ISSUE: Lacerated index finger this weekend, when cutting celery with a knife. Seen by pharmacist and had area wrapped. Bleeding has subsided. Area is tender at times and throbbing.   Review of Systems  Constitutional: Negative for fever, chills, appetite change, fatigue and unexpected weight change.  Eyes: Negative for visual disturbance.  Respiratory: Negative for shortness of breath.   Cardiovascular: Negative for chest pain and leg swelling.  Gastrointestinal: Negative for nausea, vomiting, abdominal pain, diarrhea and constipation.  Musculoskeletal: Negative for myalgias and arthralgias.  Skin: Negative for color change and rash.  Hematological: Negative for adenopathy. Does not bruise/bleed easily.  Psychiatric/Behavioral: Negative for dysphoric mood. The patient is not nervous/anxious.        Objective:  BP 154/88 mmHg  Pulse 65  Temp(Src) 97.7 F (36.5 C) (Oral)  Ht 5' 1.75" (1.568 m)  Wt 192 lb 4 oz (87.204 kg)  BMI 35.47 kg/m2  SpO2 100% Physical Exam  Constitutional: She is  oriented to person, place, and time. She appears well-developed and well-nourished. No distress.  HENT:  Head: Normocephalic and atraumatic.  Right Ear: External ear normal.  Left Ear: External ear normal.  Nose: Nose normal.  Mouth/Throat: Oropharynx is clear and moist. No oropharyngeal exudate.  Eyes: Conjunctivae are normal. Pupils are equal, round, and reactive to light. Right eye exhibits no discharge. Left eye exhibits no discharge. No scleral icterus.  Neck: Normal range of motion. Neck supple. No tracheal deviation present. No thyromegaly present.  Cardiovascular: Normal rate, regular rhythm, normal heart sounds and intact distal pulses.  Exam reveals no gallop and no friction rub.   No murmur heard. Pulmonary/Chest: Effort normal and breath sounds normal. No accessory muscle usage. No tachypnea. No respiratory distress. She has no decreased breath sounds. She has no wheezes. She has no rales. She exhibits no tenderness. Right breast exhibits no inverted nipple, no mass, no nipple discharge, no skin change and no tenderness. Left breast exhibits no inverted nipple, no mass, no nipple discharge, no skin change and no tenderness. Breasts are symmetrical.  Abdominal: Soft. Bowel sounds are normal. She exhibits no distension and no mass. There is no tenderness. There is no rebound and no guarding.  Musculoskeletal: Normal range of motion. She exhibits no edema or tenderness.  Lymphadenopathy:    She has no cervical adenopathy.  Neurological: She is alert and oriented to person, place, and time. No cranial nerve deficit. She exhibits normal muscle tone. Coordination normal.  Skin: Skin is warm and dry. Laceration (approx 1.5cm on left index finger with maceration of surrounding skin) noted. No rash noted. She is not diaphoretic. No erythema. No pallor.  Psychiatric: She has a normal mood and affect. Her behavior is normal. Judgment and thought content normal.          Assessment & Plan:     Problem List Items Addressed This Visit      Unprioritized   Hypertension    BP Readings from Last 3 Encounters:  11/13/14 154/88  06/20/14 144/74  05/09/14 161/85   BP elevated. Renal function with labs. Nurse BP recheck next week. If not improved, consider increase in Amlodipine to 10mg  daily.    Laceration of finger    Left index finger laceration, 5 days ago. Will place steristrips rather than bandaid to improve maceration. Follow up recheck 1 week.    Medicare annual wellness visit, subsequent - Primary    General medical exam including breast exam normal today. PAP and pelvic deferred given pt age and preference. Health maintenance is UTD including immunizations, except for flu vaccine which was given today. Will check labs including CMP, CBC, lipids, TSH and B12. Encouraged healthy diet and regular physical activity. Follow up in 6 months for recheck and in 1 week for BP recheck.      Relevant Orders      Comprehensive metabolic panel      CBC with Differential      Lipid panel      Microalbumin / creatinine urine ratio      Vit D  25 hydroxy (rtn osteoporosis monitoring)   Obesity (BMI 30-39.9)    Wt Readings from Last 3 Encounters:  11/13/14 192 lb 4 oz (87.204 kg)  05/08/14 198  lb (89.812 kg)  11/28/13 201 lb (91.173 kg)   Body mass index is 35.47 kg/(m^2). Encouraged healthy diet and exercise.    Relevant Orders      Hemoglobin A1c      TSH       Return in about 6 months (around 05/14/2015) for Recheck.

## 2014-11-13 NOTE — Patient Instructions (Signed)

## 2014-11-13 NOTE — Assessment & Plan Note (Signed)
BP Readings from Last 3 Encounters:  11/13/14 154/88  06/20/14 144/74  05/09/14 161/85   BP elevated. Renal function with labs. Nurse BP recheck next week. If not improved, consider increase in Amlodipine to 10mg  daily.

## 2014-11-13 NOTE — Assessment & Plan Note (Signed)
Left index finger laceration, 5 days ago. Will place steristrips rather than bandaid to improve maceration. Follow up recheck 1 week.

## 2014-11-13 NOTE — Assessment & Plan Note (Signed)
General medical exam including breast exam normal today. PAP and pelvic deferred given pt age and preference. Health maintenance is UTD including immunizations, except for flu vaccine which was given today. Will check labs including CMP, CBC, lipids, TSH and B12. Encouraged healthy diet and regular physical activity. Follow up in 6 months for recheck and in 1 week for BP recheck.

## 2014-11-13 NOTE — Assessment & Plan Note (Signed)
Wt Readings from Last 3 Encounters:  11/13/14 192 lb 4 oz (87.204 kg)  05/08/14 198 lb (89.812 kg)  11/28/13 201 lb (91.173 kg)   Body mass index is 35.47 kg/(m^2). Encouraged healthy diet and exercise.

## 2014-11-20 ENCOUNTER — Telehealth: Payer: Self-pay | Admitting: *Deleted

## 2014-11-20 ENCOUNTER — Ambulatory Visit: Payer: Medicare Other | Admitting: *Deleted

## 2014-11-20 VITALS — BP 139/83 | HR 69

## 2014-11-20 DIAGNOSIS — I10 Essential (primary) hypertension: Secondary | ICD-10-CM

## 2014-11-20 NOTE — Progress Notes (Signed)
Patient came in today for a blood pressure check. Blood pressure taken left arm with a large cuff with automatic nurse on a stick. BP was 139/83.

## 2014-11-20 NOTE — Telephone Encounter (Signed)
Patient came in today for a blood pressure check. Blood pressure taken left arm with a large cuff with automatic nurse on a stick. BP was 139/83.

## 2014-11-20 NOTE — Telephone Encounter (Signed)
OK. We will continue current medications and monitor BP for now.

## 2014-12-13 DIAGNOSIS — S39012A Strain of muscle, fascia and tendon of lower back, initial encounter: Secondary | ICD-10-CM | POA: Diagnosis not present

## 2014-12-13 DIAGNOSIS — M545 Low back pain: Secondary | ICD-10-CM | POA: Diagnosis not present

## 2015-01-01 ENCOUNTER — Ambulatory Visit: Payer: Medicare Other | Admitting: Podiatry

## 2015-01-13 ENCOUNTER — Ambulatory Visit (INDEPENDENT_AMBULATORY_CARE_PROVIDER_SITE_OTHER): Payer: Medicare Other | Admitting: Podiatry

## 2015-01-13 DIAGNOSIS — M79676 Pain in unspecified toe(s): Secondary | ICD-10-CM

## 2015-01-13 DIAGNOSIS — B351 Tinea unguium: Secondary | ICD-10-CM

## 2015-01-13 NOTE — Progress Notes (Signed)
She presents today chief complaint of painful elongated toenails as well as thick corns and calluses plantar aspect the bilateral foot.  Objective: Nails are thick yellow dystrophic onychomycotic and painful palpation. Reactive hyperkeratosis plantar aspect of the bilateral foot.  Assessment: Pain in limb secondary to onychomycosis and reactive hyperkeratosis bilateral.  Plan: Discussed etiology pathology conservative therapies at this point in time debridement all reactive hyperkeratosis and debridement nails 1 through 5 bilateral covered service secondary to pain           Padded L plantar callus.Marland Kitchen

## 2015-01-28 DIAGNOSIS — H4011X2 Primary open-angle glaucoma, moderate stage: Secondary | ICD-10-CM | POA: Diagnosis not present

## 2015-03-17 ENCOUNTER — Ambulatory Visit (INDEPENDENT_AMBULATORY_CARE_PROVIDER_SITE_OTHER): Payer: Medicare Other | Admitting: Podiatry

## 2015-03-17 DIAGNOSIS — M79676 Pain in unspecified toe(s): Secondary | ICD-10-CM

## 2015-03-17 DIAGNOSIS — B351 Tinea unguium: Secondary | ICD-10-CM

## 2015-03-17 DIAGNOSIS — L84 Corns and callosities: Secondary | ICD-10-CM

## 2015-03-17 NOTE — Progress Notes (Signed)
She presents today chief complaint of painful elongated toenails as well as thick corns and calluses plantar aspect the bilateral foot.  Objective: Nails are thick yellow dystrophic onychomycotic and painful palpation. Reactive hyperkeratosis plantar aspect of the bilateral foot.  Assessment: Pain in limb secondary to onychomycosis and reactive hyperkeratosis bilateral.  Plan: Discussed etiology pathology conservative therapies at this point in time debridement all reactive hyperkeratosis and debridement nails 1 through 5 bilateral covered service secondary to pain           Padded L plantar callus.Marland Kitchen

## 2015-04-18 DIAGNOSIS — J301 Allergic rhinitis due to pollen: Secondary | ICD-10-CM | POA: Diagnosis not present

## 2015-04-18 DIAGNOSIS — J340 Abscess, furuncle and carbuncle of nose: Secondary | ICD-10-CM | POA: Diagnosis not present

## 2015-04-18 NOTE — Op Note (Signed)
PATIENT NAME:  Tina Howard, Tina Howard MR#:  [id number] DATE OF BIRTH:  07-Aug-1933  DATE OF PROCEDURE:  04/02/2013  PREOPERATIVE DIAGNOSIS: Cataract, right eye.   POSTOPERATIVE DIAGNOSIS: Cataract, right eye.   PROCEDURE PERFORMED: Extracapsular cataract extraction using phacoemulsification with placement Alcon [id number], 23.0 diopter posterior chamber lens, serial number [id number].   SURGEON: Loura Back. Jakiah Goree, M.D.   ANESTHESIA: 4% lidocaine and 0.75% Marcaine in a 50:50 mixture with 10 units/mL of Hylenex added, given as a peribulbar.   ANESTHESIOLOGIST: Dr. Andree Elk.   COMPLICATIONS: None.   ESTIMATED BLOOD LOSS: Less than 1 mL.   DESCRIPTION OF PROCEDURE:  The patient was brought to the operating room and given a peribulbar block.  The patient was then prepped and draped in the usual fashion.  The vertical rectus muscles were imbricated using 5-0 silk sutures.  These sutures were then clamped to the sterile drapes as bridle sutures.  A limbal peritomy was performed extending two clock hours and hemostasis was obtained with cautery.  A partial thickness scleral groove was made at the surgical limbus and dissected anteriorly in a lamellar dissection using an Alcon crescent knife.  The anterior chamber was entered superonasally with a Superblade and through the lamellar dissection with a 2.6 mm keratome.  DisCoVisc was used to replace the aqueous and a continuous tear capsulorrhexis was carried out.  Hydrodissection and hydrodelineation were carried out with balanced salt and a 27 gauge canula.  The nucleus was rotated to confirm the effectiveness of the hydrodissection.  Phacoemulsification was carried out using a divide-and-conquer technique.  Total ultrasound time was 1 minute and 25 seconds with an average power of 23.1 percent, CDE of 34.88.  Irrigation/aspiration was used to remove the residual cortex.  DisCoVisc was used to inflate the capsule and the internal incision was enlarged to 3 mm  with the crescent knife.  The intraocular lens was folded and inserted into the capsular bag using the AcrySert delivery system.  Irrigation/aspiration was used to remove the residual DisCoVisc.  Miostat was injected into the anterior chamber through the paracentesis track to inflate the anterior chamber and induce miosis.  10 mL of cefuroxime was injected via the paracentesis tract.  The wound was checked for leaks and none were found. The conjunctiva was closed with cautery and the bridle sutures were removed.  Two drops of 0.3% Vigamox were placed on the eye.   An eye shield was placed on the eye.  The patient was discharged to the recovery room in good condition.    ____________________________ Loura Back Emile Ringgenberg, MD sad:es D: 04/02/2013 14:28:31 ET T: 04/02/2013 15:03:06 ET JOB#: [id number]  cc: Remo Lipps A. Deserae Jennings, MD, <Dictator> Martie Lee MD ELECTRONICALLY SIGNED 04/09/2013 13:29

## 2015-04-20 NOTE — Op Note (Signed)
PATIENT NAME:  Tina Howard, Tina Howard MR#:  [id number] DATE OF BIRTH:  06-24-1933  PREOPERATIVE DIAGNOSIS:  Cataract, left eye.    POSTOPERATIVE DIAGNOSIS:  Cataract, left eye.  PROCEDURE PERFORMED:  Extracapsular cataract extraction using phacoemulsification with placement of an Alcon SN6CWS, 21.5-diopter posterior chamber lens, serial # [id number].  SURGEON:  Loura Back. Jalexa Pifer, MD  ASSISTANT:  None.  ANESTHESIA:  4% lidocaine and 0.75% Marcaine in a 50/50 mixture with 10 units per mL of Hylenex added, given as a peribulbar.  ANESTHESIOLOGIST:  Dr. Benjamine Mola   COMPLICATIONS:  None.  ESTIMATED BLOOD LOSS:  Less than 1 mL.  DESCRIPTION OF PROCEDURE:  The patient was brought to the operating room and given a peribulbar block.  The patient was then prepped and draped in the usual fashion.  The vertical rectus muscles were imbricated using 5-0 silk sutures.  These sutures were then clamped to the sterile drapes as bridle sutures.  A limbal peritomy was performed extending two clock hours and hemostasis was obtained with cautery.  A partial thickness scleral groove was made at the surgical limbus and dissected anteriorly in a lamellar dissection using an Alcon crescent knife.  The anterior chamber was entered supero-temporally with a Superblade and through the lamellar dissection with a 2.6 mm keratome.  DisCoVisc was used to replace the aqueous and a continuous tear capsulorrhexis was carried out.  Hydrodissection and hydrodelineation were carried out with balanced salt and a 27 gauge canula.  The nucleus was rotated to confirm the effectiveness of the hydrodissection.  Phacoemulsification was carried out using a divide-and-conquer technique.  Total ultrasound time was 1 minute and 31 seconds with an average power of 16.5 percent, CDE 27.18.  Irrigation/aspiration was used to remove the residual cortex.  DisCoVisc was used to inflate the capsule and the internal incision was enlarged to 3 mm with the  crescent knife.  The intraocular lens was folded and inserted into the capsular bag using the AcrySert delivery system.  Irrigation/aspiration was used to remove the residual DisCoVisc.  Miostat was injected into the anterior chamber through the paracentesis track to inflate the anterior chamber and induce miosis.  The wound was checked for leaks and none were found. The conjunctiva was closed with cautery and the bridle sutures were removed.  Two drops of 0.3% Vigamox were placed on the eye.   An eye shield was placed on the eye.  The patient was discharged to the recovery room in good condition.  ____________________________ Loura Back Aarya Quebedeaux, MD sad:drc D: 07/05/2012 11:54:44 ET T: 07/05/2012 12:12:29 ET JOB#: [id number]  cc: Remo Lipps A. Layna Roeper, MD, <Dictator> Martie Lee MD ELECTRONICALLY SIGNED 07/10/2012 13:55

## 2015-04-20 NOTE — Op Note (Signed)
PATIENT NAME:  Tina Howard, Tina Howard MR#:  [id number] DATE OF BIRTH:  01/27/1933  DATE OF PROCEDURE:  02/22/2012  PREOPERATIVE DIAGNOSIS: Umbilical hernia.   POSTOPERATIVE DIAGNOSIS: Umbilical hernia.   PROCEDURE: Umbilical hernia repair.   SURGEON: Loreli Dollar, MD  ANESTHESIA: General.   INDICATIONS: This 81 year old female had a chief complaint of a bulge in the navel, also had some recent odor and discharge from the navel, was treated with Lotrimin and resolved. She had physical findings of an umbilical hernia. There was a bulge some 4 to 5 cm. It appeared to be large enough to admit small bowel. Surgery was recommended for definitive treatment.   DESCRIPTION OF PROCEDURE: The patient was placed on the operating table in the supine position under general anesthesia. The abdomen was prepared with ChloraPrep, draped in a sterile manner.   Infraumbilical transversely oriented curvilinear incision was made approximately 4 cm in length, carried down through subcutaneous tissues to encounter an umbilical hernia sac. The sac was dissected free from surrounding structures and followed down through the fascial ring defect and was separated from the fascia and then the sac was inverted. The sac itself was approximately 5 cm in length. Next, the fascial ring defect was further delineated. The defect itself was approximately 2 cm in diameter. This was closed with a transversely oriented suture line of interrupted 0 Surgilon figure-of-eight sutures. The repair looked good. It appeared that just above the repair that the skin of the umbilicus remained tethered in the subcutaneous tissues. Next, the dead space was closed with a 3-0 chromic pursestring suture. Next, the skin was closed with a running 5-0 chromic subcuticular suture and Dermabond.      The patient tolerated surgery satisfactorily and is now being prepared for transfer to the recovery room.  ____________________________ Lenna Sciara. Rochel Brome,  MD jws:cms D: 02/22/2012 08:26:55 ET T: 02/22/2012 11:23:57 ET JOB#: [id number]  cc: Loreli Dollar, MD, <Dictator> Loreli Dollar MD ELECTRONICALLY SIGNED 02/23/2012 15:06

## 2015-05-01 ENCOUNTER — Encounter: Payer: Self-pay | Admitting: Podiatry

## 2015-05-01 ENCOUNTER — Ambulatory Visit (INDEPENDENT_AMBULATORY_CARE_PROVIDER_SITE_OTHER): Payer: Medicare Other | Admitting: Podiatry

## 2015-05-01 VITALS — Ht 62.0 in | Wt 200.0 lb

## 2015-05-01 DIAGNOSIS — M19072 Primary osteoarthritis, left ankle and foot: Secondary | ICD-10-CM

## 2015-05-01 DIAGNOSIS — M7752 Other enthesopathy of left foot: Secondary | ICD-10-CM

## 2015-05-01 DIAGNOSIS — L84 Corns and callosities: Secondary | ICD-10-CM

## 2015-05-01 DIAGNOSIS — M779 Enthesopathy, unspecified: Secondary | ICD-10-CM

## 2015-05-01 DIAGNOSIS — M778 Other enthesopathies, not elsewhere classified: Secondary | ICD-10-CM

## 2015-05-02 NOTE — Progress Notes (Signed)
Patient ID: Tina Howard, female   DOB: 1933-08-02, 81 y.o.   MRN: [id number]  Subjective: 81 year old female presents the office today requesting injection to the left foot. She states that she relates an injection to the top of her left foot with Dr. Milinda Pointer. She states that she has an upcoming family that this we can initiate with an injection prior to it as she is having discomfort to the top of her foot due to the arthritis. She has a states that she has a painful callus on the bottom of her left foot is also painful particularly weightbearing and pressure. She denies any drainage or redness from the area. Denies any systemic complaints such as fevers, chills, nausea, vomiting. No acute changes since last appointment, and no other complaints at this time. She denies any history of injury or trauma.  Objective: AAO x3, NAD DP/PT pulses palpable bilaterally, CRT less than 3 seconds Protective sensation intact with Simms Weinstein monofilament, vibratory sensation intact, Achilles tendon reflex intact There is mild diffuse tenderness on the dorsal aspect of the midfoot on the left side. There is no specific area pinpoint bony tenderness or pain the vibratory sensation. There is mild overlying edema. There is no overlying erythema or increase in warmth. MMT 5/5, ROM WNL. Left foot submetatarsal hyperkeratotic lesion. Upon debridement underlying skin is intact and there is no drainage, ulceration or other clinical signs of infection. No edema, erythema, increase in warmth to bilateral lower extremities.  No open lesions or pre-ulcerative lesions.  No pain with calf compression, swelling, warmth, erythema  Assessment: 81 year old female left dorsal midfoot pain, likely osteoarthritis; left hyperkeratotic lesion  Plan: -All treatment options discussed with the patient including all alternatives, risks, complications.  -Previous x-rays reviewed the patient -Patient is requesting a steroid injection  to her left foot. Discussed risks, complications of the injection. Patient verbally consented. Under sterile conditions a total of 1.5 mL of a mixture of Kenalog 10, 0.5% Marcaine plain and 2% lidocaine plain was infiltrated in the area of maximal tenderness on the dorsal midfoot on the left side. Bandage was applied. Post injection care was discussed the patient. Patient tolerated the injection well any competitions. -Hyperkeratotic lesions debrided without complication/bleeding. -Follow-up as needed. In the meantime call the office with any questions, concerns, change in symptoms.

## 2015-05-14 ENCOUNTER — Encounter: Payer: Self-pay | Admitting: Internal Medicine

## 2015-05-14 ENCOUNTER — Ambulatory Visit (INDEPENDENT_AMBULATORY_CARE_PROVIDER_SITE_OTHER): Payer: Medicare Other | Admitting: Internal Medicine

## 2015-05-14 VITALS — BP 135/72 | HR 70 | Temp 97.6°F | Ht 61.75 in | Wt 193.4 lb

## 2015-05-14 DIAGNOSIS — R7309 Other abnormal glucose: Secondary | ICD-10-CM

## 2015-05-14 DIAGNOSIS — M549 Dorsalgia, unspecified: Secondary | ICD-10-CM

## 2015-05-14 DIAGNOSIS — R51 Headache: Secondary | ICD-10-CM

## 2015-05-14 DIAGNOSIS — Z780 Asymptomatic menopausal state: Secondary | ICD-10-CM

## 2015-05-14 DIAGNOSIS — E669 Obesity, unspecified: Secondary | ICD-10-CM | POA: Diagnosis not present

## 2015-05-14 DIAGNOSIS — I10 Essential (primary) hypertension: Secondary | ICD-10-CM | POA: Diagnosis not present

## 2015-05-14 DIAGNOSIS — E785 Hyperlipidemia, unspecified: Secondary | ICD-10-CM | POA: Diagnosis not present

## 2015-05-14 DIAGNOSIS — G8929 Other chronic pain: Secondary | ICD-10-CM

## 2015-05-14 DIAGNOSIS — R739 Hyperglycemia, unspecified: Secondary | ICD-10-CM

## 2015-05-14 LAB — COMPREHENSIVE METABOLIC PANEL
ALT: 15 U/L (ref 0–35)
AST: 18 U/L (ref 0–37)
Albumin: 4 g/dL (ref 3.5–5.2)
Alkaline Phosphatase: 51 U/L (ref 39–117)
BUN: 15 mg/dL (ref 6–23)
CALCIUM: 9.5 mg/dL (ref 8.4–10.5)
CHLORIDE: 104 meq/L (ref 96–112)
CO2: 31 meq/L (ref 19–32)
CREATININE: 0.51 mg/dL (ref 0.40–1.20)
GFR: 123.33 mL/min (ref >60.00)
GLUCOSE: 102 mg/dL — AB (ref 70–99)
Potassium: 3.5 mEq/L (ref 3.5–5.1)
Sodium: 140 mEq/L (ref 135–145)
Total Bilirubin: 0.6 mg/dL (ref 0.2–1.2)
Total Protein: 6.6 g/dL (ref 6.0–8.3)

## 2015-05-14 LAB — LIPID PANEL
Cholesterol: 168 mg/dL (ref 0–200)
HDL: 62.5 mg/dL (ref >39.00)
LDL Cholesterol: 86 mg/dL (ref 0–99)
NONHDL: 105.5
Total CHOL/HDL Ratio: 3
Triglycerides: 99 mg/dL (ref 0.0–149.0)
VLDL: 19.8 mg/dL (ref 0.0–40.0)

## 2015-05-14 LAB — MICROALBUMIN / CREATININE URINE RATIO
Creatinine,U: 118.8 mg/dL
MICROALB/CREAT RATIO: 1.4 mg/g (ref 0.0–30.0)
Microalb, Ur: 1.7 mg/dL (ref 0.0–1.9)

## 2015-05-14 LAB — HEMOGLOBIN A1C: Hgb A1c MFr Bld: 5.7 % (ref 4.6–6.5)

## 2015-05-14 MED ORDER — POTASSIUM CHLORIDE CRYS ER 20 MEQ PO TBCR: EXTENDED_RELEASE_TABLET | ORAL | Status: DC

## 2015-05-14 NOTE — Assessment & Plan Note (Signed)
BP Readings from Last 3 Encounters:  05/14/15 135/72  11/20/14 139/83  11/13/14 154/88   BP well controlled. Renal function with labs. Continue current meds.

## 2015-05-14 NOTE — Assessment & Plan Note (Signed)
Mild headache. MRI brain normal. Symptoms controlled with prn Ibuprofen. Will continue.

## 2015-05-14 NOTE — Progress Notes (Signed)
Pre visit review using our clinic review tool, if applicable. No additional management support is needed unless otherwise documented below in the visit note. 

## 2015-05-14 NOTE — Patient Instructions (Signed)
Labs today.  Follow up in 6 months. 

## 2015-05-14 NOTE — Assessment & Plan Note (Signed)
Wt Readings from Last 3 Encounters:  05/14/15 193 lb 6 oz (87.714 kg)  05/01/15 200 lb (90.719 kg)  11/13/14 192 lb 4 oz (87.204 kg)   Body mass index is 35.68 kg/(m^2). Encouraged healthy diet and exercise.

## 2015-05-14 NOTE — Assessment & Plan Note (Signed)
Symptoms fairly well controlled with Ibuprofen. Encouraged core strengthening exercises.

## 2015-05-14 NOTE — Progress Notes (Signed)
Subjective:    Patient ID: Tina Howard, female    DOB: 05/01/1933, 81 y.o.   MRN: [id number]  HPI  81YO female presents for follow up.  Feeling well. No concerns today. Compliant with medications.  Occasional left sided headache, mild. Had MRI in the past which was normal. Taking occasional ibuprofen, more for back pain than headache.  Wt Readings from Last 3 Encounters:  05/14/15 193 lb 6 oz (87.714 kg)  05/01/15 200 lb (90.719 kg)  11/13/14 192 lb 4 oz (87.204 kg)     Past medical, surgical, family and social history per today's encounter.  Review of Systems  Constitutional: Negative for fever, chills, appetite change, fatigue and unexpected weight change.  Eyes: Negative for visual disturbance.  Respiratory: Negative for shortness of breath.   Cardiovascular: Negative for chest pain and leg swelling.  Gastrointestinal: Negative for abdominal pain, diarrhea and constipation.  Skin: Negative for color change and rash.  Hematological: Negative for adenopathy. Does not bruise/bleed easily.  Psychiatric/Behavioral: Negative for dysphoric mood. The patient is not nervous/anxious.        Objective:    BP 135/72 mmHg  Pulse 70  Temp(Src) 97.6 F (36.4 C) (Oral)  Ht 5' 1.75" (1.568 m)  Wt 193 lb 6 oz (87.714 kg)  BMI 35.68 kg/m2  SpO2 100% Physical Exam  Constitutional: She is oriented to person, place, and time. She appears well-developed and well-nourished. No distress.  HENT:  Head: Normocephalic and atraumatic.  Right Ear: External ear normal.  Left Ear: External ear normal.  Nose: Nose normal.  Mouth/Throat: Oropharynx is clear and moist. No oropharyngeal exudate.  Eyes: Conjunctivae are normal. Pupils are equal, round, and reactive to light. Right eye exhibits no discharge. Left eye exhibits no discharge. No scleral icterus.  Neck: Normal range of motion. Neck supple. No tracheal deviation present. No thyromegaly present.  Cardiovascular: Normal rate,  regular rhythm, normal heart sounds and intact distal pulses.  Exam reveals no gallop and no friction rub.   No murmur heard. Pulmonary/Chest: Effort normal and breath sounds normal. No respiratory distress. She has no wheezes. She has no rales. She exhibits no tenderness.  Musculoskeletal: Normal range of motion. She exhibits no edema or tenderness.  Lymphadenopathy:    She has no cervical adenopathy.  Neurological: She is alert and oriented to person, place, and time. No cranial nerve deficit. She exhibits normal muscle tone. Coordination normal.  Skin: Skin is warm and dry. No rash noted. She is not diaphoretic. No erythema. No pallor.  Psychiatric: She has a normal mood and affect. Her behavior is normal. Judgment and thought content normal.          Assessment & Plan:   Problem List Items Addressed This Visit      Unprioritized   Back pain, chronic    Symptoms fairly well controlled with Ibuprofen. Encouraged core strengthening exercises.       Elevated blood sugar    Will check A1c with labs.      Relevant Orders   Hemoglobin A1c   Headache    Mild headache. MRI brain normal. Symptoms controlled with prn Ibuprofen. Will continue.      Hyperlipidemia    Will check lipids with labs. Continue Simvastatin.      Relevant Orders   Lipid panel   Hypertension - Primary    BP Readings from Last 3 Encounters:  05/14/15 135/72  11/20/14 139/83  11/13/14 154/88   BP well controlled. Renal function with  labs. Continue current meds.      Relevant Orders   Comprehensive metabolic panel   Microalbumin / creatinine urine ratio   Obesity (BMI 30-39.9)    Wt Readings from Last 3 Encounters:  05/14/15 193 lb 6 oz (87.714 kg)  05/01/15 200 lb (90.719 kg)  11/13/14 192 lb 4 oz (87.204 kg)   Body mass index is 35.68 kg/(m^2). Encouraged healthy diet and exercise.       Other Visit Diagnoses    Postmenopausal estrogen deficiency        Relevant Orders    DG Bone  Density        Return in about 6 months (around 11/14/2015) for Wellness Visit.

## 2015-05-14 NOTE — Assessment & Plan Note (Signed)
Will check A1c with labs.

## 2015-05-14 NOTE — Assessment & Plan Note (Signed)
Will check lipids with labs. Continue Simvastatin. 

## 2015-05-17 IMAGING — MG MM CAD SCREENING MAMMO
1 series · 4 of 4 positions shown · non-contrast
Comparison: none

REASON FOR EXAM: SCR MAMMO NO ORDER
COMMENTS:

PROCEDURE:     MAM - MAM DGTL SCRN MAM NO ORDER W/CAD  - May 18, 2013 [DATE]
RESULT:     COMPARISON:  05/16/2012, 08/13/2008
TECHNIQUE: Digital screening mammograms were obtained. FDA approved
computer-aided detection (CAD) for mammography was utilized for this study.
BREAST COMPOSITION: The breast composition is SCATTERED FIBROGLANDULAR
TISSUE (glandular tissue is  25-50%)

[R CC · right · 4 of 4 slices shown]
[im 1/4]
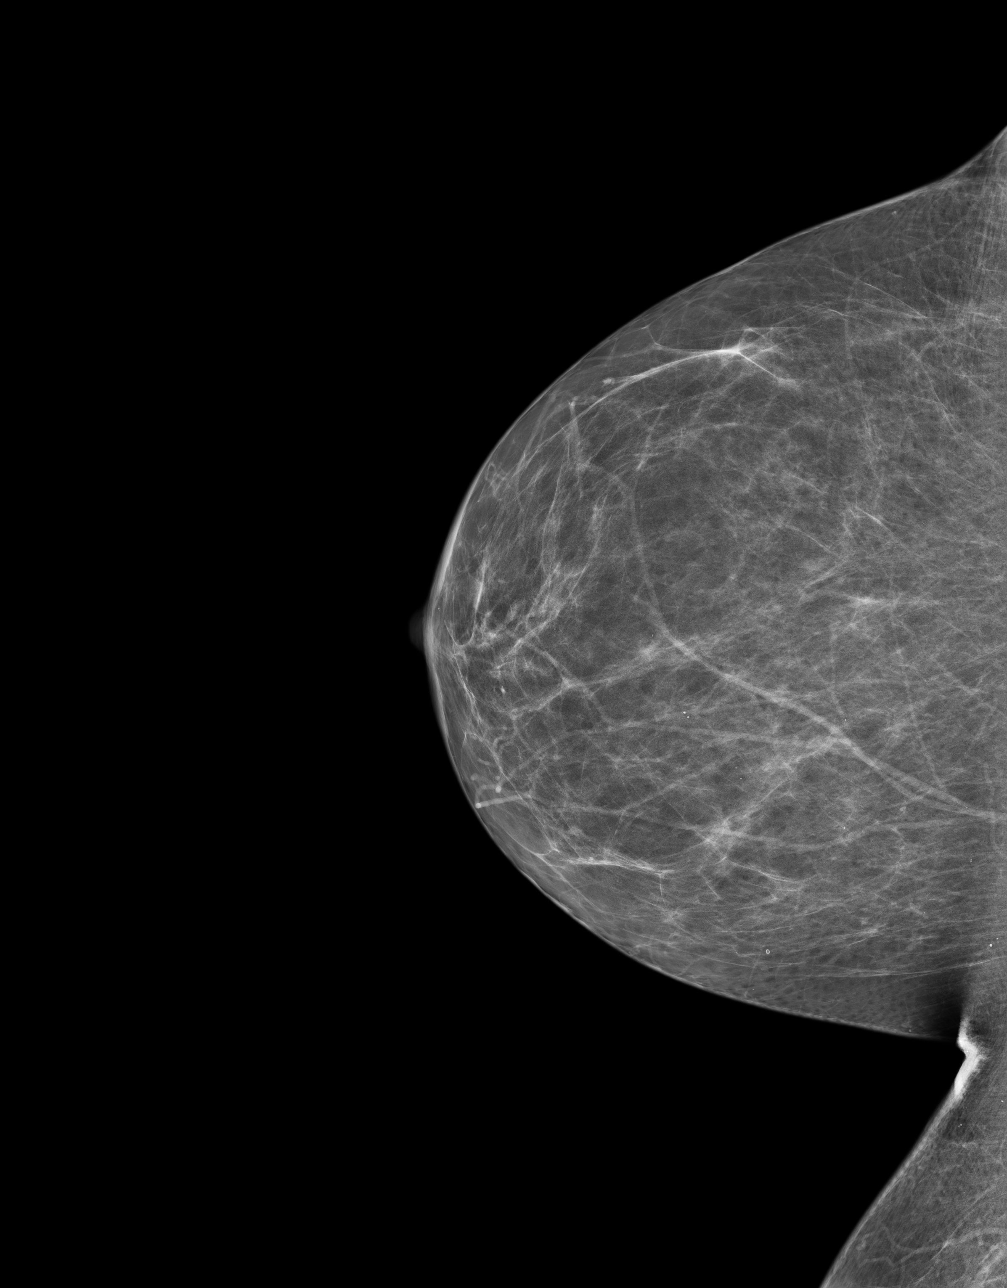
[im 2/4]
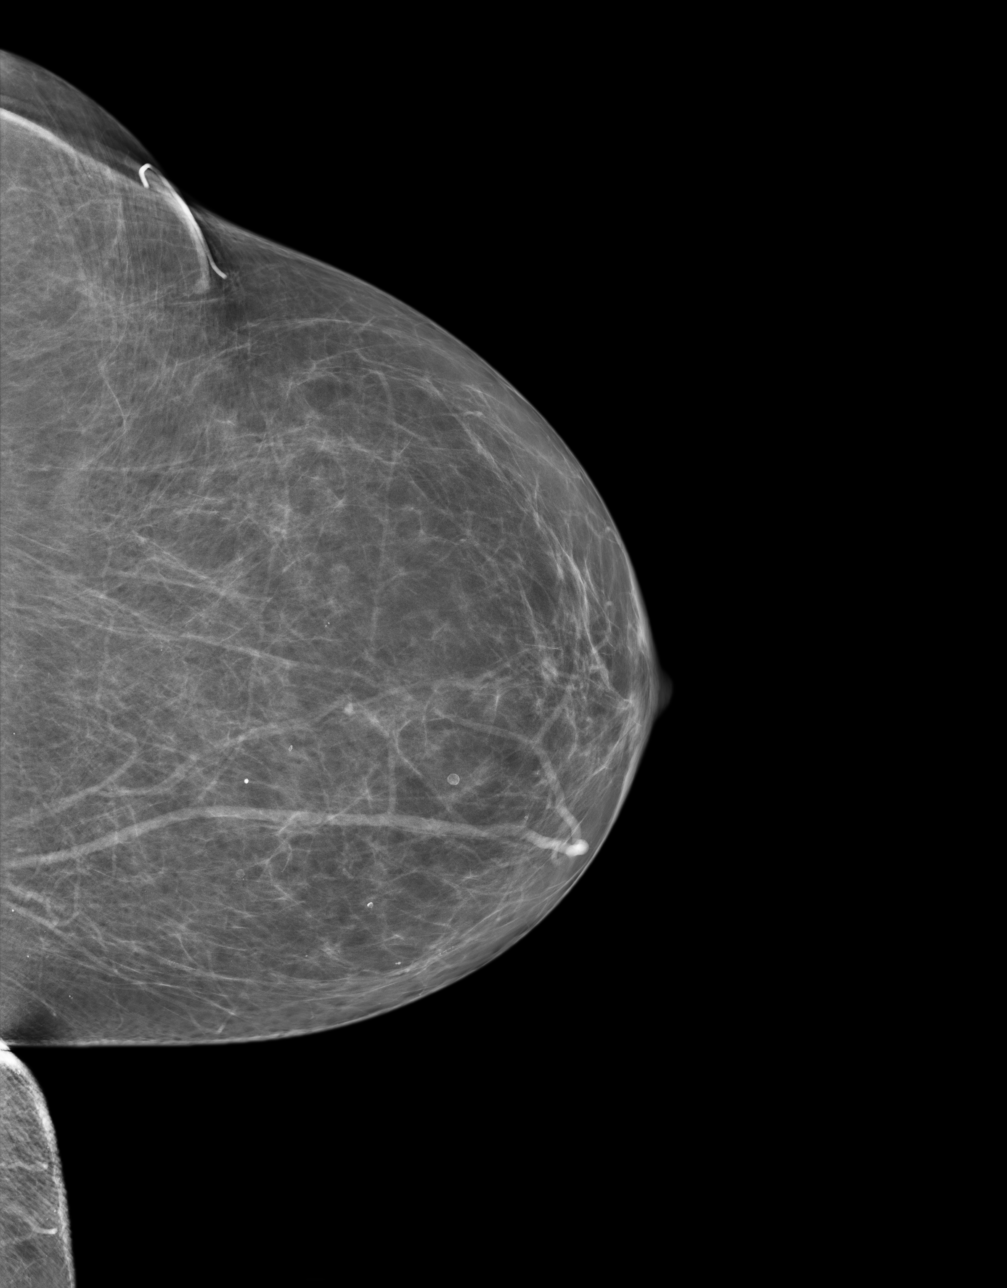
[im 3/4]
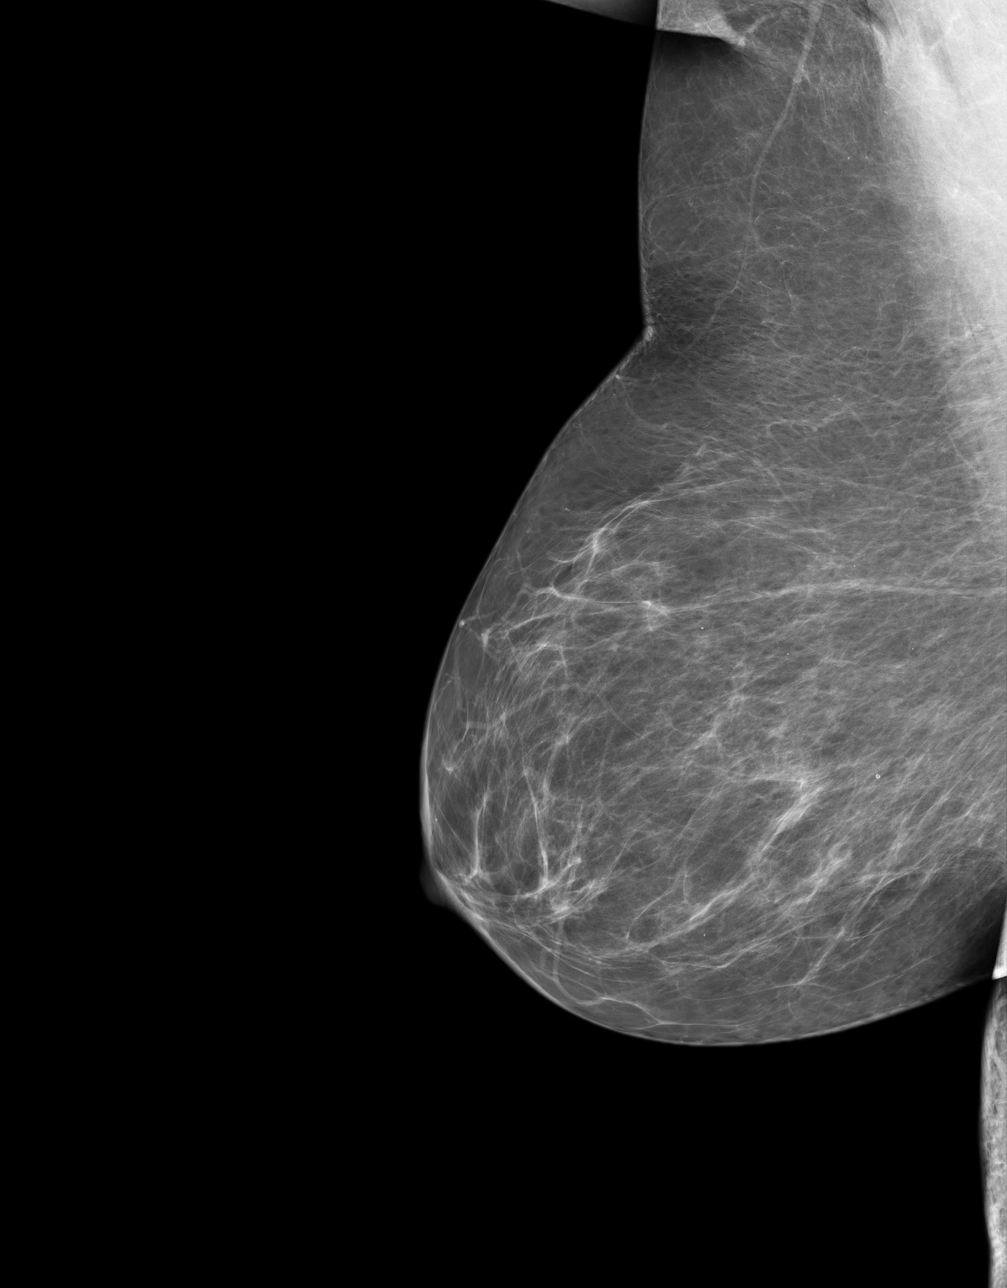
[im 4/4]
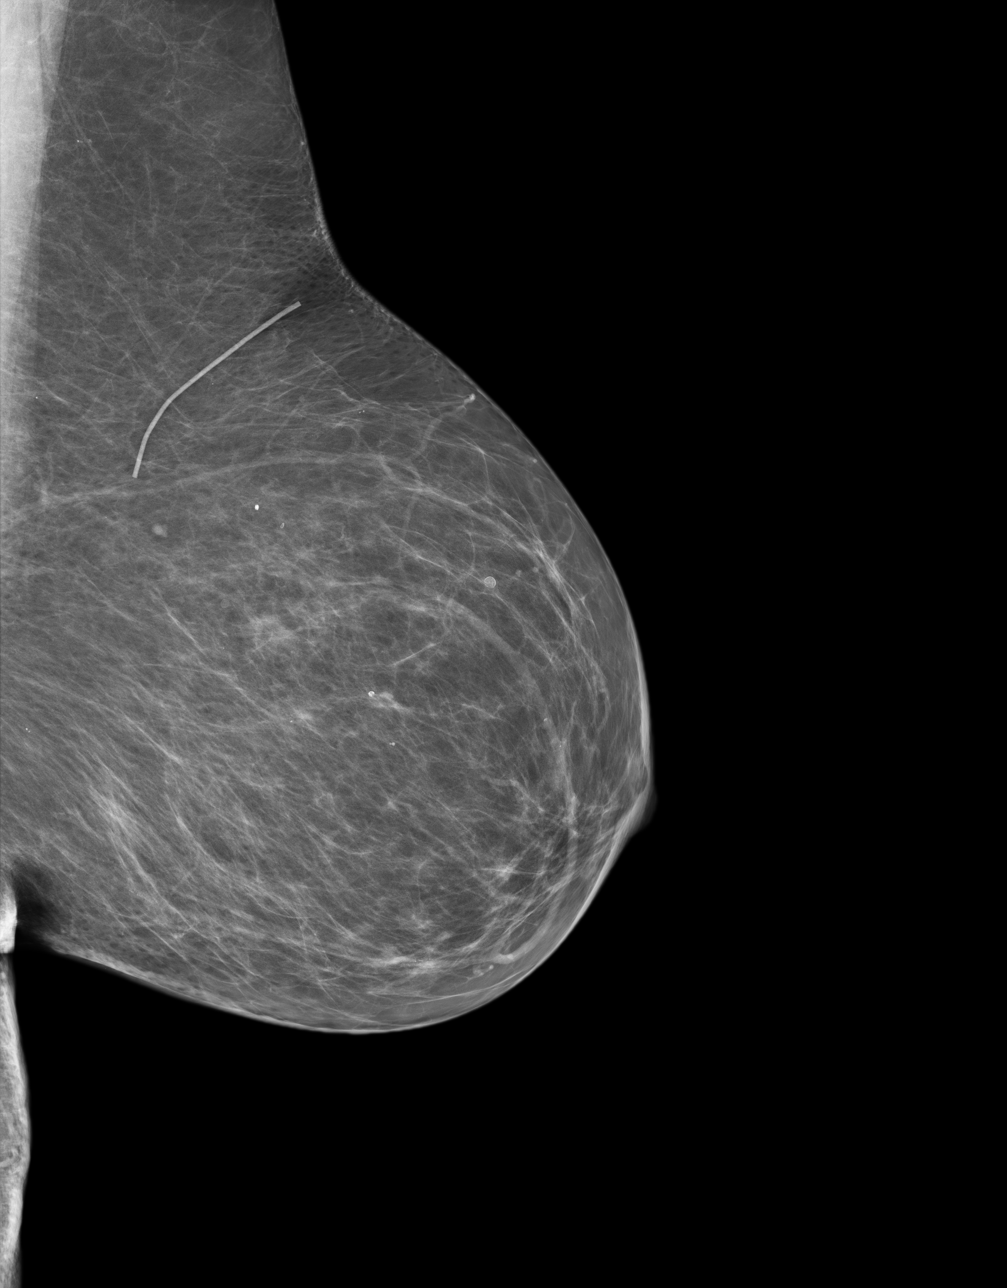

[4 of 4 positions shown; findings below may reference images not displayed]

FINDING: There is no dominant mass, architectural distortion or clusters of
suspicious microcalcifications.
IMPRESSION: 1.     Stable bilateral mammogram.
2.     Annual mammographic follow up recommended.

BI-RADS:  Category 1- Negative

A negative mammogram report does not preclude biopsy or other evaluation of
a clinically palpable or otherwise suspicious mass or lesion. Breast cancer
may not be detected by mammography in up to 10% of cases.

[REDACTED]

## 2015-05-21 ENCOUNTER — Ambulatory Visit (INDEPENDENT_AMBULATORY_CARE_PROVIDER_SITE_OTHER): Payer: Medicare Other | Admitting: Podiatry

## 2015-05-21 DIAGNOSIS — M79676 Pain in unspecified toe(s): Secondary | ICD-10-CM | POA: Diagnosis not present

## 2015-05-21 DIAGNOSIS — Q828 Other specified congenital malformations of skin: Secondary | ICD-10-CM | POA: Diagnosis not present

## 2015-05-21 DIAGNOSIS — B351 Tinea unguium: Secondary | ICD-10-CM | POA: Diagnosis not present

## 2015-05-21 MED ORDER — DICLOFENAC SODIUM 1 % TD GEL
4.0000 g | Freq: Four times a day (QID) | TRANSDERMAL | Status: DC
Start: 2015-05-21 — End: 2015-07-23

## 2015-05-21 NOTE — Progress Notes (Signed)
Patient presents to the office today with a chief complaint of painful elongated toenails.  Objective: Pulses are palpable bilateral. Nails are thick yellow dystrophic clinically mycotic and painful palpation.  Assessment: Pain in limb secondary to onychomycosis 1 through 5 bilateral.  Plan: Debridement of nails 1 through 5 bilateral covered service secondary to pain.  

## 2015-05-23 ENCOUNTER — Other Ambulatory Visit: Payer: Self-pay | Admitting: Internal Medicine

## 2015-05-29 ENCOUNTER — Ambulatory Visit
Admission: RE | Admit: 2015-05-29 | Discharge: 2015-05-29 | Disposition: A | Discharge status: Home / Self Care | Payer: Medicare Other | Source: Ambulatory Visit | Attending: Internal Medicine | Admitting: Internal Medicine

## 2015-05-29 DIAGNOSIS — Z780 Asymptomatic menopausal state: Secondary | ICD-10-CM

## 2015-05-29 DIAGNOSIS — M8580 Other specified disorders of bone density and structure, unspecified site: Secondary | ICD-10-CM | POA: Insufficient documentation

## 2015-06-16 DIAGNOSIS — D1800 Hemangioma unspecified site: Secondary | ICD-10-CM | POA: Diagnosis not present

## 2015-06-16 DIAGNOSIS — L814 Other melanin hyperpigmentation: Secondary | ICD-10-CM | POA: Diagnosis not present

## 2015-06-16 DIAGNOSIS — D485 Neoplasm of uncertain behavior of skin: Secondary | ICD-10-CM | POA: Diagnosis not present

## 2015-06-16 DIAGNOSIS — L821 Other seborrheic keratosis: Secondary | ICD-10-CM | POA: Diagnosis not present

## 2015-06-16 DIAGNOSIS — L578 Other skin changes due to chronic exposure to nonionizing radiation: Secondary | ICD-10-CM | POA: Diagnosis not present

## 2015-06-16 DIAGNOSIS — I8310 Varicose veins of unspecified lower extremity with inflammation: Secondary | ICD-10-CM | POA: Diagnosis not present

## 2015-06-16 DIAGNOSIS — D229 Melanocytic nevi, unspecified: Secondary | ICD-10-CM | POA: Diagnosis not present

## 2015-06-16 DIAGNOSIS — L820 Inflamed seborrheic keratosis: Secondary | ICD-10-CM | POA: Diagnosis not present

## 2015-06-16 DIAGNOSIS — Z1283 Encounter for screening for malignant neoplasm of skin: Secondary | ICD-10-CM | POA: Diagnosis not present

## 2015-06-16 DIAGNOSIS — L570 Actinic keratosis: Secondary | ICD-10-CM | POA: Diagnosis not present

## 2015-07-23 ENCOUNTER — Ambulatory Visit (INDEPENDENT_AMBULATORY_CARE_PROVIDER_SITE_OTHER): Payer: Medicare Other | Admitting: Podiatry

## 2015-07-23 ENCOUNTER — Encounter: Payer: Self-pay | Admitting: Podiatry

## 2015-07-23 DIAGNOSIS — Q828 Other specified congenital malformations of skin: Secondary | ICD-10-CM | POA: Diagnosis not present

## 2015-07-23 DIAGNOSIS — M79676 Pain in unspecified toe(s): Secondary | ICD-10-CM | POA: Diagnosis not present

## 2015-07-23 DIAGNOSIS — B351 Tinea unguium: Secondary | ICD-10-CM

## 2015-07-23 MED ORDER — DICLOFENAC SODIUM 1 % TD GEL: 4.0000 g | Freq: Four times a day (QID) | TRANSDERMAL | Status: DC

## 2015-07-23 NOTE — Progress Notes (Signed)
Patient presents to the office today with a chief complaint of painful elongated toenails and hyperkeratotic lesions. She is also concerned about her hammertoe deformity right foot.  Objective: Pulses are palpable bilateral. Nails are thick yellow dystrophic clinically mycotic and painful palpation. Arthritis foot bilateral. Mild hammertoe deformity fifth right. Contracture of the extensor tendon.  Assessment: Pain in limb secondary to onychomycosis 1 through 5 bilateral and hyperkeratotic lesions. OA. Possible hammertoe deformity with contractures fifth digit right.  Plan: Debridement of nails 1 through 5 bilateral covered service secondary to pain and debridement hyperkeratotic lesions.Diclofenac Gel. Consider tenotomy in the future.

## 2015-07-24 NOTE — Progress Notes (Signed)
Authorization for Voltaren gel was needed and approved

## 2015-07-29 DIAGNOSIS — H4011X2 Primary open-angle glaucoma, moderate stage: Secondary | ICD-10-CM | POA: Diagnosis not present

## 2015-07-31 ENCOUNTER — Other Ambulatory Visit: Payer: Self-pay | Admitting: *Deleted

## 2015-07-31 MED ORDER — DICLOFENAC SODIUM 1 % TD GEL: 4.0000 g | Freq: Four times a day (QID) | TRANSDERMAL | Status: DC

## 2015-07-31 NOTE — Telephone Encounter (Signed)
Request from [facility] for voltaren gel , ok

## 2015-08-06 DIAGNOSIS — H26493 Other secondary cataract, bilateral: Secondary | ICD-10-CM | POA: Diagnosis not present

## 2015-08-06 DIAGNOSIS — H4011X2 Primary open-angle glaucoma, moderate stage: Secondary | ICD-10-CM | POA: Diagnosis not present

## 2015-08-13 DIAGNOSIS — E784 Other hyperlipidemia: Secondary | ICD-10-CM | POA: Diagnosis not present

## 2015-08-13 DIAGNOSIS — R42 Dizziness and giddiness: Secondary | ICD-10-CM | POA: Diagnosis not present

## 2015-08-13 DIAGNOSIS — I10 Essential (primary) hypertension: Secondary | ICD-10-CM | POA: Diagnosis not present

## 2015-08-21 DIAGNOSIS — H02833 Dermatochalasis of right eye, unspecified eyelid: Secondary | ICD-10-CM | POA: Diagnosis not present

## 2015-09-15 ENCOUNTER — Ambulatory Visit (INDEPENDENT_AMBULATORY_CARE_PROVIDER_SITE_OTHER): Payer: Medicare Other | Admitting: Family Medicine

## 2015-09-15 ENCOUNTER — Encounter: Payer: Self-pay | Admitting: Family Medicine

## 2015-09-15 ENCOUNTER — Other Ambulatory Visit: Payer: Self-pay | Admitting: *Deleted

## 2015-09-15 VITALS — BP 130/62 | HR 74 | Temp 98.4°F | Wt 196.0 lb

## 2015-09-15 DIAGNOSIS — J0100 Acute maxillary sinusitis, unspecified: Secondary | ICD-10-CM

## 2015-09-15 DIAGNOSIS — J019 Acute sinusitis, unspecified: Secondary | ICD-10-CM | POA: Insufficient documentation

## 2015-09-15 MED ORDER — AMOXICILLIN-POT CLAVULANATE 875-125 MG PO TABS: 1.0000 | ORAL_TABLET | Freq: Two times a day (BID) | ORAL | Status: DC

## 2015-09-15 NOTE — Progress Notes (Signed)
Patient ID: FLORIDE HUTMACHER, female   DOB: [birth year], 81 y.o.   MRN: [id number]  Tommi Rumps, MD Phone: [phone]  ZAMARA COZAD is a 81 y.o. female who presents today for same day appointment.  Sinus infection: patient notes 3 weeks of post nasal drip and dry cough, followed by sore throat and ear fullness with maxillary sinus pressure. Notes Sinus pressure has been present for 2 weeks. Started to have mildly productive cough last night with yellow sputum. Notes aches all over. Has 2 nights last week with mild wheezing and heavy breathing. No chest pain with this.  No dyspnea or chest pain at this time. Notes she has a history of deviated septum to the right.   PMH: nonsmoker.   ROS see HPI  Objective  Physical Exam Filed Vitals:   09/15/15 0845  BP: 130/62  Pulse: 74  Temp: 98.4 F (36.9 C)    Physical Exam  Constitutional: She is well-developed, well-nourished, and in no distress.  HENT:  Head: Normocephalic and atraumatic.  Right Ear: External ear normal.  Left Ear: External ear normal.  Mild posterior OP erythema, no exudates, no tonsillar swelling, mild tenderness over maxillary sinuses, normal TMs bilaterally  Eyes: Conjunctivae are normal. Pupils are equal, round, and reactive to light.  Neck: Neck supple.  Cardiovascular: Normal rate, regular rhythm and normal heart sounds.  Exam reveals no gallop and no friction rub.   No murmur heard. Pulmonary/Chest: Effort normal and breath sounds normal. No respiratory distress. She has no wheezes. She has no rales.  Lymphadenopathy:    She has no cervical adenopathy.  Skin: Skin is warm and dry. She is not diaphoretic.     Assessment/Plan: Please see individual problem list.  Acute sinusitis Symptoms consistent with acute sinus infection, likely bacterial given duration of symptoms with no improvement. Normal lung exam. Normal O2 sat. No issues breathing at this time. Will treat with augmentin. Can take claritin  and flonase for this as well. Given return precautions.     Meds ordered this encounter  Medications  . amoxicillin-clavulanate (AUGMENTIN) 875-125 MG per tablet    Sig: Take 1 tablet by mouth 2 (two) times daily.    Dispense:  14 tablet    Refill:  0    Tommi Rumps

## 2015-09-15 NOTE — Progress Notes (Signed)
Pre visit review using our clinic review tool, if applicable. No additional management support is needed unless otherwise documented below in the visit note. 

## 2015-09-15 NOTE — Assessment & Plan Note (Signed)
Symptoms consistent with acute sinus infection, likely bacterial given duration of symptoms with no improvement. Normal lung exam. Normal O2 sat. No issues breathing at this time. Will treat with augmentin. Can take claritin and flonase for this as well. Given return precautions.

## 2015-09-15 NOTE — Patient Instructions (Signed)
Nice to meet you. You have a sinus infection. We will treat you with augmentin for this.  Please take the claratin as well and you can use over the counter flonase 2 sprays in each nostril daily.  If you develop fever, shortness of breath, chest pain, or feel poorly please seek medical attention.   Sinusitis Sinusitis is redness, soreness, and inflammation of the paranasal sinuses. Paranasal sinuses are air pockets within the bones of your face (beneath the eyes, the middle of the forehead, or above the eyes). In healthy paranasal sinuses, mucus is able to drain out, and air is able to circulate through them by way of your nose. However, when your paranasal sinuses are inflamed, mucus and air can become trapped. This can allow bacteria and other germs to grow and cause infection. Sinusitis can develop quickly and last only a short time (acute) or continue over a long period (chronic). Sinusitis that lasts for more than 12 weeks is considered chronic.  CAUSES  Causes of sinusitis include:  Allergies.  Structural abnormalities, such as displacement of the cartilage that separates your nostrils (deviated septum), which can decrease the air flow through your nose and sinuses and affect sinus drainage.  Functional abnormalities, such as when the small hairs (cilia) that line your sinuses and help remove mucus do not work properly or are not present. SIGNS AND SYMPTOMS  Symptoms of acute and chronic sinusitis are the same. The primary symptoms are pain and pressure around the affected sinuses. Other symptoms include:  Upper toothache.  Earache.  Headache.  Bad breath.  Decreased sense of smell and taste.  A cough, which worsens when you are lying flat.  Fatigue.  Fever.  Thick drainage from your nose, which often is green and may contain pus (purulent).  Swelling and warmth over the affected sinuses. DIAGNOSIS  Your health care provider will perform a physical exam. During the exam,  your health care provider may:  Look in your nose for signs of abnormal growths in your nostrils (nasal polyps).  Tap over the affected sinus to check for signs of infection.  View the inside of your sinuses (endoscopy) using an imaging device that has a light attached (endoscope). If your health care provider suspects that you have chronic sinusitis, one or more of the following tests may be recommended:  Allergy tests.  Nasal culture. A sample of mucus is taken from your nose, sent to a lab, and screened for bacteria.  Nasal cytology. A sample of mucus is taken from your nose and examined by your health care provider to determine if your sinusitis is related to an allergy. TREATMENT  Most cases of acute sinusitis are related to a viral infection and will resolve on their own within 10 days. Sometimes medicines are prescribed to help relieve symptoms (pain medicine, decongestants, nasal steroid sprays, or saline sprays).  However, for sinusitis related to a bacterial infection, your health care provider will prescribe antibiotic medicines. These are medicines that will help kill the bacteria causing the infection.  Rarely, sinusitis is caused by a fungal infection. In theses cases, your health care provider will prescribe antifungal medicine. For some cases of chronic sinusitis, surgery is needed. Generally, these are cases in which sinusitis recurs more than 3 times per year, despite other treatments. HOME CARE INSTRUCTIONS   Drink plenty of water. Water helps thin the mucus so your sinuses can drain more easily.  Use a humidifier.  Inhale steam 3 to 4 times a day (  for example, sit in the bathroom with the shower running).  Apply a warm, moist washcloth to your face 3 to 4 times a day, or as directed by your health care provider.  Use saline nasal sprays to help moisten and clean your sinuses.  Take medicines only as directed by your health care provider.  If you were prescribed  either an antibiotic or antifungal medicine, finish it all even if you start to feel better. SEEK IMMEDIATE MEDICAL CARE IF:  You have increasing pain or severe headaches.  You have nausea, vomiting, or drowsiness.  You have swelling around your face.  You have vision problems.  You have a stiff neck.  You have difficulty breathing. MAKE SURE YOU:   Understand these instructions.  Will watch your condition.  Will get help right away if you are not doing well or get worse. Document Released: 12/13/2005 Document Revised: 04/29/2014 Document Reviewed: 12/28/2011 [facility] Patient Information 2015 [place], [place]. This information is not intended to replace advice given to you by your health care provider. Make sure you discuss any questions you have with your health care provider.

## 2015-09-24 ENCOUNTER — Encounter: Payer: Self-pay | Admitting: Podiatry

## 2015-09-24 ENCOUNTER — Ambulatory Visit (INDEPENDENT_AMBULATORY_CARE_PROVIDER_SITE_OTHER): Payer: Medicare Other | Admitting: Podiatry

## 2015-09-24 DIAGNOSIS — B351 Tinea unguium: Secondary | ICD-10-CM | POA: Diagnosis not present

## 2015-09-24 DIAGNOSIS — Q828 Other specified congenital malformations of skin: Secondary | ICD-10-CM

## 2015-09-24 DIAGNOSIS — M79676 Pain in unspecified toe(s): Secondary | ICD-10-CM

## 2015-09-24 NOTE — Progress Notes (Signed)
She presents today for chief complaint of painful elongated toenails and painful calluses plantar aspect of the bilateral foot. She denies changes in her past medical history medications and allergies. States that she's just recently gotten over bronchitis and nasal infection/sinus infection.  Objective: Vital signs are stable she is alert and oriented 3 pulses are palpable. She has no erythema edema cellulitis drainage or odor to the foot. Her nails are long thick yellow dystrophic likely mycotic. They're painful on palpation as well as debridement. She also has thick reactive hyperkeratotic lesions plantar aspect of bilateral foot.  Assessment: Pain in limb secondary to onychomycosis and poor keratoses bilateral.  Plan: Discussed etiology pathology conservative versus surgical therapies. We'll follow-up with her in 2 months for debridement. We debridement nails 1 through 5 bilaterally as a covered service secondary to pain.

## 2015-09-25 DIAGNOSIS — L821 Other seborrheic keratosis: Secondary | ICD-10-CM | POA: Diagnosis not present

## 2015-09-25 DIAGNOSIS — L578 Other skin changes due to chronic exposure to nonionizing radiation: Secondary | ICD-10-CM | POA: Diagnosis not present

## 2015-09-25 DIAGNOSIS — L820 Inflamed seborrheic keratosis: Secondary | ICD-10-CM | POA: Diagnosis not present

## 2015-09-25 DIAGNOSIS — L570 Actinic keratosis: Secondary | ICD-10-CM | POA: Diagnosis not present

## 2015-11-13 ENCOUNTER — Encounter: Payer: Self-pay | Admitting: Internal Medicine

## 2015-11-13 ENCOUNTER — Ambulatory Visit (INDEPENDENT_AMBULATORY_CARE_PROVIDER_SITE_OTHER): Payer: Medicare Other | Admitting: Internal Medicine

## 2015-11-13 VITALS — BP 116/69 | HR 74 | Temp 98.1°F | Ht 61.0 in | Wt 195.4 lb

## 2015-11-13 DIAGNOSIS — M19011 Primary osteoarthritis, right shoulder: Secondary | ICD-10-CM | POA: Diagnosis not present

## 2015-11-13 DIAGNOSIS — R7309 Other abnormal glucose: Secondary | ICD-10-CM | POA: Diagnosis not present

## 2015-11-13 DIAGNOSIS — M19012 Primary osteoarthritis, left shoulder: Secondary | ICD-10-CM

## 2015-11-13 DIAGNOSIS — M549 Dorsalgia, unspecified: Secondary | ICD-10-CM

## 2015-11-13 DIAGNOSIS — B372 Candidiasis of skin and nail: Secondary | ICD-10-CM

## 2015-11-13 DIAGNOSIS — R739 Hyperglycemia, unspecified: Secondary | ICD-10-CM

## 2015-11-13 DIAGNOSIS — Z23 Encounter for immunization: Secondary | ICD-10-CM

## 2015-11-13 DIAGNOSIS — G8929 Other chronic pain: Secondary | ICD-10-CM

## 2015-11-13 DIAGNOSIS — I10 Essential (primary) hypertension: Secondary | ICD-10-CM

## 2015-11-13 LAB — COMPREHENSIVE METABOLIC PANEL
ALT: 12 U/L (ref 0–35)
AST: 14 U/L (ref 0–37)
Albumin: 4 g/dL (ref 3.5–5.2)
Alkaline Phosphatase: 57 U/L (ref 39–117)
BUN: 19 mg/dL (ref 6–23)
CHLORIDE: 103 meq/L (ref 96–112)
CO2: 29 meq/L (ref 19–32)
CREATININE: 0.51 mg/dL (ref 0.40–1.20)
Calcium: 9.5 mg/dL (ref 8.4–10.5)
GFR: 123.18 mL/min (ref >60.00)
GLUCOSE: 129 mg/dL — AB (ref 70–99)
POTASSIUM: 4 meq/L (ref 3.5–5.1)
SODIUM: 140 meq/L (ref 135–145)
Total Bilirubin: 0.5 mg/dL (ref 0.2–1.2)
Total Protein: 6.7 g/dL (ref 6.0–8.3)

## 2015-11-13 LAB — LIPID PANEL
Cholesterol: 167 mg/dL (ref 0–200)
HDL: 57.9 mg/dL (ref >39.00)
LDL CALC: 87 mg/dL (ref 0–99)
NONHDL: 108.8
Total CHOL/HDL Ratio: 3
Triglycerides: 107 mg/dL (ref 0.0–149.0)
VLDL: 21.4 mg/dL (ref 0.0–40.0)

## 2015-11-13 LAB — CBC WITH DIFFERENTIAL/PLATELET
BASOS PCT: 0.5 % (ref 0.0–3.0)
Basophils Absolute: 0 10*3/uL (ref 0.0–0.1)
EOS ABS: 0.3 10*3/uL (ref 0.0–0.7)
EOS PCT: 4.1 % (ref 0.0–5.0)
HCT: 37.5 % (ref 36.0–46.0)
HEMOGLOBIN: 12.5 g/dL (ref 12.0–15.0)
Lymphocytes Relative: 22.2 % (ref 12.0–46.0)
Lymphs Abs: 1.3 10*3/uL (ref 0.7–4.0)
MCHC: 33.4 g/dL (ref 30.0–36.0)
MCV: 82.5 fl (ref 78.0–100.0)
Monocytes Absolute: 0.6 10*3/uL (ref 0.1–1.0)
Monocytes Relative: 9.5 % (ref 3.0–12.0)
NEUTROS PCT: 63.7 % (ref 43.0–77.0)
Neutro Abs: 3.9 10*3/uL (ref 1.4–7.7)
Platelets: 251 10*3/uL (ref 150.0–400.0)
RBC: 4.54 Mil/uL (ref 3.87–5.11)
RDW: 13.9 % (ref 11.5–15.5)
WBC: 6.1 10*3/uL (ref 4.0–10.5)

## 2015-11-13 LAB — HEMOGLOBIN A1C: Hgb A1c MFr Bld: 5.8 % (ref 4.6–6.5)

## 2015-11-13 MED ORDER — POTASSIUM CHLORIDE CRYS ER 20 MEQ PO TBCR: EXTENDED_RELEASE_TABLET | ORAL | Status: DC

## 2015-11-13 MED ORDER — MELOXICAM 15 MG PO TABS: 15.0000 mg | ORAL_TABLET | Freq: Every day | ORAL | Status: DC

## 2015-11-13 NOTE — Assessment & Plan Note (Signed)
Chronic OA pain in shoulders. She does not want to pursue surgical intervention. Will start back Meloxicam 15mg  daily and set up PT. Follow up in 4 weeks and prn.

## 2015-11-13 NOTE — Assessment & Plan Note (Signed)
BP Readings from Last 3 Encounters:  11/13/15 116/69  09/15/15 130/62  05/14/15 135/72   BP well controlled. Renal function with labs.

## 2015-11-13 NOTE — Assessment & Plan Note (Signed)
Will check A1c with labs today. 

## 2015-11-13 NOTE — Assessment & Plan Note (Signed)
Recent worsening symptoms. Will add Meloxicam to see if any improvement. Start PT. Follow up in 4 weeks.

## 2015-11-13 NOTE — Progress Notes (Signed)
Pre visit review using our clinic review tool, if applicable. No additional management support is needed unless otherwise documented below in the visit note. 

## 2015-11-13 NOTE — Assessment & Plan Note (Signed)
Exam c/w candidiasis. Will continue topical Lotrimin cream or powder.

## 2015-11-13 NOTE — Patient Instructions (Signed)
Start Meloxicam 15mg  daily.  We will set up physical therapy.

## 2015-11-13 NOTE — Progress Notes (Signed)
Subjective:    Patient ID: Tina Howard, female    DOB: January 30, 1933, 81 y.o.   MRN: [id number]  HPI  81YO female presents for follow up.  Having aching pain in bilateral shoulders. Has been followed by Dr. Marry Guan for this. Declined shoulder arthroscopy or replacement. Previously took Meloxicam with some improving. Aching pain. Does not radiate. Worsened with movement.  Also concerned about rash under abdominal skin fold. This comes and goes. Worse in summer. Not itchy. Improved with use of Lotrimin.   Wt Readings from Last 3 Encounters:  11/13/15 195 lb 6 oz (88.622 kg)  09/15/15 196 lb (88.905 kg)  05/14/15 193 lb 6 oz (87.714 kg)   BP Readings from Last 3 Encounters:  11/13/15 116/69  09/15/15 130/62  05/14/15 135/72    Past Medical History  Diagnosis Date  . Allergic rhinitis   . Hypercholesteremia   . History of pelvic mass     s/p resection, benign  . Hypertension   . Varicella zoster 2007   Family History  Problem Relation Age of Onset  . Heart disease Father    Past Surgical History  Procedure Laterality Date  . Total knee arthroplasty      left  . Vaginal hysterectomy      benign reasons  . Knee arthroscopy      right  . Cataract extraction Bilateral 2014    Dr. Sandra Cockayne   Social History   Social History  . Marital Status: Single    Spouse Name: N/A  . Number of Children: N/A  . Years of Education: N/A   Social History Main Topics  . Smoking status: Never Smoker   . Smokeless tobacco: Never Used  . Alcohol Use: No  . Drug Use: No  . Sexual Activity: Not Asked   Other Topics Concern  . None   Social History Narrative    Review of Systems  Constitutional: Negative for fever, chills, appetite change, fatigue and unexpected weight change.  Eyes: Negative for visual disturbance.  Respiratory: Negative for shortness of breath.   Cardiovascular: Negative for chest pain and leg swelling.  Gastrointestinal: Negative for nausea, vomiting,  abdominal pain, diarrhea and constipation.  Musculoskeletal: Positive for myalgias, back pain and arthralgias.  Skin: Positive for color change and rash.  Hematological: Negative for adenopathy. Does not bruise/bleed easily.  Psychiatric/Behavioral: Negative for dysphoric mood. The patient is not nervous/anxious.        Objective:    BP 116/69 mmHg  Pulse 74  Temp(Src) 98.1 F (36.7 C) (Oral)  Ht 5\' 1"  (1.549 m)  Wt 195 lb 6 oz (88.622 kg)  BMI 36.94 kg/m2  SpO2 97% Physical Exam  Constitutional: She is oriented to person, place, and time. She appears well-developed and well-nourished. No distress.  HENT:  Head: Normocephalic and atraumatic.  Right Ear: External ear normal.  Left Ear: External ear normal.  Nose: Nose normal.  Mouth/Throat: Oropharynx is clear and moist. No oropharyngeal exudate.  Eyes: Conjunctivae and EOM are normal. Pupils are equal, round, and reactive to light. Right eye exhibits no discharge.  Neck: Normal range of motion. Neck supple. No thyromegaly present.  Cardiovascular: Normal rate, regular rhythm, normal heart sounds and intact distal pulses.  Exam reveals no gallop and no friction rub.   No murmur heard. Pulmonary/Chest: Effort normal. No respiratory distress. She has no wheezes. She has no rales.  Abdominal: Soft. Bowel sounds are normal. She exhibits no distension and no mass. There is no tenderness.  There is no rebound and no guarding.  Musculoskeletal: She exhibits no edema or tenderness.       Right shoulder: She exhibits decreased range of motion and pain (with abduction). She exhibits no tenderness, no bony tenderness, no crepitus and normal strength.       Left shoulder: She exhibits decreased range of motion and pain. She exhibits no tenderness, no crepitus and normal strength.  Lymphadenopathy:    She has no cervical adenopathy.  Neurological: She is alert and oriented to person, place, and time. No cranial nerve deficit. Coordination  normal.  Skin: Skin is warm and dry. Rash noted. Rash is maculopapular (erythematous under pannus folds). She is not diaphoretic. No erythema. No pallor.  Psychiatric: She has a normal mood and affect. Her behavior is normal. Judgment and thought content normal.          Assessment & Plan:   Problem List Items Addressed This Visit      Unprioritized   Back pain, chronic    Recent worsening symptoms. Will add Meloxicam to see if any improvement. Start PT. Follow up in 4 weeks.      Relevant Medications   meloxicam (MOBIC) 15 MG tablet   Candidal skin infection    Exam c/w candidiasis. Will continue topical Lotrimin cream or powder.      Elevated blood sugar    Will check A1c with labs today.      Relevant Orders   Hemoglobin A1c   Hypertension    BP Readings from Last 3 Encounters:  11/13/15 116/69  09/15/15 130/62  05/14/15 135/72   BP well controlled. Renal function with labs.      Relevant Orders   Comprehensive metabolic panel   Lipid panel   Osteoarthritis - Primary    Chronic OA pain in shoulders. She does not want to pursue surgical intervention. Will start back Meloxicam 15mg  daily and set up PT. Follow up in 4 weeks and prn.      Relevant Medications   meloxicam (MOBIC) 15 MG tablet   Other Relevant Orders   Ambulatory referral to Physical Therapy   CBC with Differential/Platelet    Other Visit Diagnoses    Encounter for immunization            Return in about 4 weeks (around 12/11/2015) for Recheck.

## 2015-11-15 ENCOUNTER — Other Ambulatory Visit: Payer: Self-pay | Admitting: Internal Medicine

## 2015-11-17 ENCOUNTER — Ambulatory Visit: Payer: Medicare Other | Attending: Internal Medicine

## 2015-11-17 DIAGNOSIS — M19011 Primary osteoarthritis, right shoulder: Secondary | ICD-10-CM | POA: Insufficient documentation

## 2015-11-17 DIAGNOSIS — M25512 Pain in left shoulder: Secondary | ICD-10-CM | POA: Diagnosis not present

## 2015-11-17 DIAGNOSIS — M19012 Primary osteoarthritis, left shoulder: Secondary | ICD-10-CM | POA: Diagnosis not present

## 2015-11-17 DIAGNOSIS — R531 Weakness: Secondary | ICD-10-CM

## 2015-11-17 DIAGNOSIS — M25511 Pain in right shoulder: Secondary | ICD-10-CM | POA: Diagnosis not present

## 2015-11-17 NOTE — Therapy (Signed)
[place] [facility] [address] [place], [address] Phone: [phone]   Fax:  [phone]  Physical Therapy Evaluation  Patient Details  Name: Tina Howard MRN: [id number] Date of Birth: Dec 07, 1933 Referring Provider: Jackolyn Confer, MD  Encounter Date: 11/17/2015      PT End of Session - 11/17/15 0834    Visit Number 1   Number of Visits 17   Date for PT Re-Evaluation 01/15/16   Authorization Type 1   Authorization Time Period of 10   PT Start Time 0834   PT Stop Time 0943   PT Time Calculation (min) 69 min   Activity Tolerance Patient tolerated treatment well   Behavior During Therapy [facility] for tasks assessed/performed      Past Medical History  Diagnosis Date  . Allergic rhinitis   . Hypercholesteremia   . History of pelvic mass     s/p resection, benign  . Hypertension   . Varicella zoster 2007  . Osteoporosis     per pt report on Medical Screening Form     Past Surgical History  Procedure Laterality Date  . Total knee arthroplasty      left  . Vaginal hysterectomy      benign reasons  . Knee arthroscopy      right  . Cataract extraction Bilateral 2014    Dr. Sandra Cockayne  . Foot surgery      There were no vitals filed for this visit.  Visit Diagnosis:  Osteoarthritis of both shoulders, unspecified osteoarthritis type - Plan: PT plan of care cert/re-cert  Pain of both shoulder joints - Plan: PT plan of care cert/re-cert  Weakness - Plan: PT plan of care cert/re-cert      Subjective Assessment - 11/17/15 0842    Subjective R shoulder pain: 8/10 current (when reaching, moving her shoulder), 9/10 at worst (when pt reaches up or to side). L shoulder pain: 2/10 current (when pt moves her L arm), 2/10 at worst.  Patient also adds that she will be out of town December 10, 2015 to around  December 26, 2015.    Pertinent History Bilateral shoulder pain began years ago with her L shoulder  pain occurring first, followed by her R shoulder. Patient is R hand dominant. Difficulty reaching with her R arm to hang stuff in her closet. Reached for an article of clothing 3 weeks ago from her closet and her R shoulder bothered her. Went to her MD who prescribed physical therapy and Meloxicam (daily) last Thursday.  Melixicam helps.  Pt desires not to have shoulder surgery.   Has not yet had physical therapy for her shoulder.    Patient Stated Goals Not to have surgery. Be able to use her R arm and not have pain or injure it more than it probably is.    Currently in Pain? Yes   Pain Score --  please see subjective   Pain Orientation Right;Left   Pain Descriptors / Indicators Sore  pain description depends on what she is doing.  Pt also states hearing the rubbing in her shoulder when she reaches too high.   Aggravating Factors  reaching up, and to the side (horizontal abduction).      Objectives:  Manual therapy Supine posterior and inferior glide to R shoulder grade 3- with R shoulder in abduction (90 degrees)  Supine soft tissue mobilization to R terres major  Slight decrease discomfort with R shoulder with flexion after  manual therapy.         [facility] PT Assessment - 11/17/15 0854    Assessment   Medical Diagnosis Bilateral shoulder osteoarthritis   Referring Provider Jackolyn Confer, MD   Onset Date/Surgical Date 11/13/15  Date order signed   Hand Dominance Right   Prior Therapy No known physical therapy for current condition   Precautions   Precaution Comments No known precautions   Restrictions   Other Position/Activity Restrictions No known restrictions   Balance Screen   Has the patient fallen in the past 6 months Yes  going into her son's house due to high step without rails   How many times? 1   Has the patient had a decrease in activity level because of a fear of falling?  No  But states fear of falling and more careful   Is the patient reluctant to leave their  home because of a fear of falling?  No   Prior Function   Vocation Retired   Biomedical scientist PLOF: Better able to raise her arms up, reach   Observation/Other Assessments   Observations R shoulder: (+) Neer's, (-) empty can. Posterior R shoulder pain with Michel Bickers test. L shoulder: (+) Neer's, empty can. Posterior L shoulder pain with Michel Bickers test.     Posture/Postural Control   Posture Comments Kyphosis, bilaterally protracted shoulders and neck   AROM   Overall AROM Comments Functional ER: R to the top of her head, L ER to the back of her head. Functional IR R to R piriformis, L IR to L PSIS. Bilateral shoulder flexion and abduction: shoulder shrug compensation   Right Shoulder Flexion 90 Degrees  with anterior shoulder pain   Right Shoulder ABduction 96 Degrees  with pain    Left Shoulder Flexion 93 Degrees  with pain   Left Shoulder ABduction 83 Degrees  with inferior shoulder and arm pain (axillary)   Strength   Right Shoulder Flexion 4/5  with pain   Right Shoulder ABduction 4+/5  with pain   Right Shoulder Internal Rotation 4/5   Right Shoulder External Rotation 4/5   Left Shoulder Flexion 4/5   Left Shoulder ABduction 4/5   Left Shoulder Internal Rotation 4/5   Left Shoulder External Rotation 4-/5   Palpation   Palpation comment TTP R distal biceps tendon, R proximal medial biceps insertion. L Coracoid process, L posterior shoulder joint inferior to scapular spine.  Slight decrease in inferior glenohumeral glide R shoulder. Crepitus R shoulder                            PT Education - 11/17/15 1335    Education provided Yes   Education Details plan of care   Person(s) Educated Patient   Methods Explanation;Demonstration;Tactile cues;Verbal cues   Comprehension Verbalized understanding;Returned demonstration             PT Long Term Goals - 11/17/15 1322    PT LONG TERM GOAL #1   Title Patient will have a decrease in R  shoulder pain to 5/10 or less at worst to promote ability to raise her arm, reach, don and doff clothes   Time 8   Period Weeks   Status New   PT LONG TERM GOAL #2   Title Patient will improve bilateral shoulder flexion and abduction by at least 30 degrees to promote ability to raise her arm, reach behind her head   Time 8  Period Weeks   Status New   PT LONG TERM GOAL #3   Title Patient will report decreased difficulty donning and doffing clothes as a demonstration of improved function.    Time 8   Period Weeks   Status New   PT LONG TERM GOAL #4   Title Patient will improve bilateral shoulder ER strength by at least 1/2 MMT grade to promote ability to raise her arms.    Time 8   Period Weeks   Status New   PT LONG TERM GOAL #5   Title --   Time --   Period --   Status --               Plan - 12/12/15 1315    Clinical Impression Statement Patient is an 81 year old female who came to physical therapy secondary to bilateral shoulder pain R > L. She also presents with limited bilateral shoulder AROM, weakness, tenderness to palpation bilateral shoulders, poor posture (kyphosis, bilateral shoulder protraction, protracted neck),  crepitus, possible rotator cuff involvement,  and difficulty performing functional tasks such as raising her arm, reaching behind her head or back, as well as donning and doffing clothes. Patient will benefit from skilled physical therapy intervention to address the aforementioned deficits.    Pt will benefit from skilled therapeutic intervention in order to improve on the following deficits Pain;Decreased strength;Improper body mechanics;Decreased range of motion   Rehab Potential Fair   Clinical Impairments Affecting Rehab Potential age, pain, chronicity of condition   PT Frequency 2x / week   PT Duration 8 weeks   PT Treatment/Interventions Therapeutic exercise;Manual techniques;Therapeutic activities;Iontophoresis 4mg /ml Dexamethasone;Electrical  Stimulation;Patient/family education;Neuromuscular re-education;Ultrasound   PT Next Visit Plan manual therapy, joint mobilizatoin (if appropriate), soft tissue mobilization, scapular strengthening, thoracic extension, rotator cuff strengthening, glenohumeral control   Consulted and Agree with Plan of Care Patient          G-Codes - Dec 12, 2015 1330    Functional Assessment Tool Used Patient interview, clinical presentation   Functional Limitation Carrying, moving and handling objects   Carrying, Moving and Handling Objects Current Status [id number]) At least 40 percent but less than 60 percent impaired, limited or restricted   Carrying, Moving and Handling Objects Goal Status [id number]) At least 20 percent but less than 40 percent impaired, limited or restricted       Problem List Patient Active Problem List   Diagnosis Date Noted  . Candidal skin infection 11/13/2015  . Obesity (BMI 30-39.9) 11/13/2014  . Back pain, chronic 05/08/2014  . Elevated blood sugar 05/08/2014  . Headache 10/30/2013  . Medicare annual wellness visit, subsequent 06/27/2012  . Hypertension 08/20/2011  . Hyperlipidemia 08/20/2011  . Osteoarthritis 08/20/2011   Thank you for your referral.   Joneen Boers PT, DPT    December 12, 2015, 3:24 PM  [place] [facility] [address], [place], [address] Phone: [phone]   Fax:  [phone]  Name: MACKINZI GOBLE MRN: [id number] Date of Birth: 08-26-1933

## 2015-11-19 ENCOUNTER — Ambulatory Visit: Payer: Medicare Other

## 2015-11-19 DIAGNOSIS — M19012 Primary osteoarthritis, left shoulder: Secondary | ICD-10-CM | POA: Diagnosis not present

## 2015-11-19 DIAGNOSIS — R531 Weakness: Secondary | ICD-10-CM

## 2015-11-19 DIAGNOSIS — M25512 Pain in left shoulder: Secondary | ICD-10-CM

## 2015-11-19 DIAGNOSIS — M19011 Primary osteoarthritis, right shoulder: Secondary | ICD-10-CM

## 2015-11-19 DIAGNOSIS — M25511 Pain in right shoulder: Secondary | ICD-10-CM

## 2015-11-19 NOTE — Therapy (Signed)
[place] [facility] [address] [place], [address] Phone: [phone]   Fax:  [phone]  Physical Therapy Treatment  Patient Details  Name: Tina Howard MRN: [id number] Date of Birth: [birth year] Referring Provider: Jackolyn Confer, MD  Encounter Date: 11/19/2015      PT End of Session - 11/19/15 0945    Visit Number 2   Number of Visits 17   Date for PT Re-Evaluation 01/15/16   Authorization Type 2   Authorization Time Period of 10   PT Start Time 0945   PT Stop Time 1031   PT Time Calculation (min) 46 min   Activity Tolerance Patient tolerated treatment well   Behavior During Therapy [facility] for tasks assessed/performed      Past Medical History  Diagnosis Date  . Allergic rhinitis   . Hypercholesteremia   . History of pelvic mass     s/p resection, benign  . Hypertension   . Varicella zoster 2007  . Osteoporosis     per pt report on Medical Screening Form     Past Surgical History  Procedure Laterality Date  . Total knee arthroplasty      left  . Vaginal hysterectomy      benign reasons  . Knee arthroscopy      right  . Cataract extraction Bilateral 2014    Dr. Sandra Cockayne  . Foot surgery      There were no vitals filed for this visit.  Visit Diagnosis:  Osteoarthritis of both shoulders, unspecified osteoarthritis type  Pain of both shoulder joints  Weakness      Subjective Assessment - 11/19/15 0943    Subjective It's getting a little bit better. Also taking the meloxicam to which might help. Pt also adds sleeping on her R side.  4/10 R shoulder currently (just took off her jacket).   Pertinent History Bilateral shoulder pain began years ago with her L shoulder pain occurring first, followed by her R shoulder. Patient is R hand dominant. Difficulty reaching with her R arm to hang stuff in her closet. Reached for an article of clothing 3 weeks ago from her closet and her R shoulder  bothered her. Went to her MD who prescribed physical therapy and Meloxicam (daily) last Thursday.  Melixicam helps.  Pt desires not to have shoulder surgery.   Has not yet had physical therapy for her shoulder.    Patient Stated Goals Not to have surgery. Be able to use her R arm and not have pain or injure it more than it probably is.    Currently in Pain? Yes   Pain Score 4       Objectives:  Manual therapy Supine posterior and inferior glide to R shoulder grade 3- with R shoulder in 90 degree abduction (decreased inferior glenohumeral joint stiffness)  Supine soft tissue mobilization to R terres major Soft tissue mobilization to R rhomboid major and minor (improved R shoulder flexion and abduction AROM)  decrease discomfort with R shoulder flexion and abduction after manual therapy   There-ex  Directed patient with standing bilateral shoulder ER resisting yellow band 10x3  Standing bilateral shoulder extension resisting yellow band 10x3 Standing R shoulder IR resisting yellow band 10x2  Improved exercise technique, movement at target joints, use of target muscles after mod verbal, visual, tactile cues.       AROM: R shoulder flexion 115 degrees, R shoulder abduction 105 degrees. Improved R shoulder AROM after manual  therapy and ER strengthening.                          PT Education - 11/19/15 1022    Education provided Yes   Education Details ther-ex   [employer]) Educated Patient   Methods Explanation;Demonstration;Tactile cues;Verbal cues   Comprehension Verbalized understanding;Returned demonstration             PT Long Term Goals - 11/17/15 1322    PT LONG TERM GOAL #1   Title Patient will have a decrease in R shoulder pain to 5/10 or less at worst to promote ability to raise her arm, reach, don and doff clothes   Time 8   Period Weeks   Status New   PT LONG TERM GOAL #2   Title Patient will improve bilateral shoulder flexion and abduction  by at least 30 degrees to promote ability to raise her arm, reach behind her head   Time 8   Period Weeks   Status New   PT LONG TERM GOAL #3   Title Patient will report decreased difficulty donning and doffing clothes as a demonstration of improved function.    Time 8   Period Weeks   Status New   PT LONG TERM GOAL #4   Title Patient will improve bilateral shoulder ER strength by at least 1/2 MMT grade to promote ability to raise her arms.    Time 8   Period Weeks   Status New   PT LONG TERM GOAL #5   Title --   Time --   Period --   Status --               Plan - 11/19/15 0943    Clinical Impression Statement AROM: R shoulder flexion 115 degrees, R shoulder abduction 105 degrees. Improved R shoulder AROM after manual therapy and ER strengthening.   Pt will benefit from skilled therapeutic intervention in order to improve on the following deficits Pain;Decreased strength;Improper body mechanics;Decreased range of motion   Rehab Potential Fair   Clinical Impairments Affecting Rehab Potential age, pain, chronicity of condition   PT Frequency 2x / week   PT Duration 8 weeks   PT Treatment/Interventions Therapeutic exercise;Manual techniques;Therapeutic activities;Iontophoresis 4mg /ml Dexamethasone;Electrical Stimulation;Patient/family education;Neuromuscular re-education;Ultrasound   PT Next Visit Plan manual therapy, joint mobilizatoin (if appropriate), soft tissue mobilization, scapular strengthening, thoracic extension, rotator cuff strengthening, glenohumeral control   Consulted and Agree with Plan of Care Patient        Problem List Patient Active Problem List   Diagnosis Date Noted  . Candidal skin infection 11/13/2015  . Obesity (BMI 30-39.9) 11/13/2014  . Back pain, chronic 05/08/2014  . Elevated blood sugar 05/08/2014  . Headache 10/30/2013  . Medicare annual wellness visit, subsequent 06/27/2012  . Hypertension 08/20/2011  . Hyperlipidemia 08/20/2011  .  Osteoarthritis 08/20/2011    Joneen Boers PT, DPT   11/19/2015, 11:53 AM  [place] [facility] [address], [place], [address] Phone: [phone]   Fax:  [phone]  Name: Tina Howard MRN: [id number] Date of Birth: 03-07-1933

## 2015-11-24 ENCOUNTER — Ambulatory Visit: Payer: Medicare Other | Admitting: Podiatry

## 2015-11-25 ENCOUNTER — Ambulatory Visit: Payer: Medicare Other

## 2015-11-25 DIAGNOSIS — M19011 Primary osteoarthritis, right shoulder: Secondary | ICD-10-CM

## 2015-11-25 DIAGNOSIS — M25512 Pain in left shoulder: Secondary | ICD-10-CM | POA: Diagnosis not present

## 2015-11-25 DIAGNOSIS — M25511 Pain in right shoulder: Secondary | ICD-10-CM

## 2015-11-25 DIAGNOSIS — R531 Weakness: Secondary | ICD-10-CM | POA: Diagnosis not present

## 2015-11-25 DIAGNOSIS — M19012 Primary osteoarthritis, left shoulder: Secondary | ICD-10-CM | POA: Diagnosis not present

## 2015-11-25 NOTE — Therapy (Signed)
[place] [facility] [address] [place], [address] Phone: [phone]   Fax:  [phone]  Physical Therapy Treatment  Patient Details  Name: Tina Howard MRN: [id number] Date of Birth: [birth year] Referring Provider: Jackolyn Confer, MD  Encounter Date: 11/25/2015      PT End of Session - 11/25/15 1600    Visit Number 3   Number of Visits 17   Date for PT Re-Evaluation 01/15/16   Authorization Type 3   Authorization Time Period of 10   PT Start Time 1600   PT Stop Time 1641   PT Time Calculation (min) 41 min   Activity Tolerance Patient tolerated treatment well   Behavior During Therapy [facility] for tasks assessed/performed      Past Medical History  Diagnosis Date  . Allergic rhinitis   . Hypercholesteremia   . History of pelvic mass     s/p resection, benign  . Hypertension   . Varicella zoster 2007  . Osteoporosis     per pt report on Medical Screening Form     Past Surgical History  Procedure Laterality Date  . Total knee arthroplasty      left  . Vaginal hysterectomy      benign reasons  . Knee arthroscopy      right  . Cataract extraction Bilateral 2014    Dr. Sandra Cockayne  . Foot surgery      There were no vitals filed for this visit.  Visit Diagnosis:  Osteoarthritis of both shoulders, unspecified osteoarthritis type  Pain of both shoulder joints  Weakness      Subjective Assessment - 11/25/15 1600    Subjective R shoulder pain (anterior and lateral) 7/10 when she raises her R arm to the side.    Pertinent History Bilateral shoulder pain began years ago with her L shoulder pain occurring first, followed by her R shoulder. Patient is R hand dominant. Difficulty reaching with her R arm to hang stuff in her closet. Reached for an article of clothing 3 weeks ago from her closet and her R shoulder bothered her. Went to her MD who prescribed physical therapy and Meloxicam (daily) last  Thursday.  Melixicam helps.  Pt desires not to have shoulder surgery.   Has not yet had physical therapy for her shoulder.    Patient Stated Goals Not to have surgery. Be able to use her R arm and not have pain or injure it more than it probably is.    Currently in Pain? Yes   Pain Score 7       Objectives:  Manual therapy Supine posterior and inferior glide to R shoulder grade 3- to 3 with R shoulder in 80 degrees abduction (decreased inferior glenohumeral joint stiffness, improved R shoulder flexion and abduction with less discomfort)   Soft tissue mobilization to R rhomboid major and minor (improved R shoulder flexion and horizontal abduction AROM)  decrease discomfort with R shoulder flexion and abduction after manual therapy    There-ex  Directed patient with standing bilateral shoulder ER resisting yellow band 10x3  Standing bilateral shoulder extension resisting yellow band 10x3 Standing R and L  shoulder IR resisting yellow band 10x3 Supine scapular protraction 10x2 with 5 second holds each UE   Improved exercise technique, movement at target joints, use of target muscles after mod verbal, visual, tactile cues.    Decreased R shoulder discomfort with flexion and horizontal abduction after joint mobilization and soft tissue  mobilization to R rhomboid muscle.                                PT Education - 11/25/15 1641    Education provided Yes   Education Details ther-ex   [employer]) Educated Patient   Methods Explanation;Demonstration;Tactile cues;Verbal cues   Comprehension Verbalized understanding;Returned demonstration             PT Long Term Goals - 11/17/15 1322    PT LONG TERM GOAL #1   Title Patient will have a decrease in R shoulder pain to 5/10 or less at worst to promote ability to raise her arm, reach, don and doff clothes   Time 8   Period Weeks   Status New   PT LONG TERM GOAL #2   Title Patient will improve bilateral  shoulder flexion and abduction by at least 30 degrees to promote ability to raise her arm, reach behind her head   Time 8   Period Weeks   Status New   PT LONG TERM GOAL #3   Title Patient will report decreased difficulty donning and doffing clothes as a demonstration of improved function.    Time 8   Period Weeks   Status New   PT LONG TERM GOAL #4   Title Patient will improve bilateral shoulder ER strength by at least 1/2 MMT grade to promote ability to raise her arms.    Time 8   Period Weeks   Status New   PT LONG TERM GOAL #5   Title --   Time --   Period --   Status --               Plan - 11/25/15 1600    Clinical Impression Statement Decreased R shoulder discomfort with flexion and horizontal abduction after joint mobilization and soft tissue mobilization to R rhomboid muscle.    Pt will benefit from skilled therapeutic intervention in order to improve on the following deficits Pain;Decreased strength;Improper body mechanics;Decreased range of motion   Rehab Potential Fair   Clinical Impairments Affecting Rehab Potential age, pain, chronicity of condition   PT Frequency 2x / week   PT Duration 8 weeks   PT Treatment/Interventions Therapeutic exercise;Manual techniques;Therapeutic activities;Iontophoresis 4mg /ml Dexamethasone;Electrical Stimulation;Patient/family education;Neuromuscular re-education;Ultrasound   PT Next Visit Plan manual therapy, joint mobilizatoin (if appropriate), soft tissue mobilization, scapular strengthening, thoracic extension, rotator cuff strengthening, glenohumeral control   Consulted and Agree with Plan of Care Patient        Problem List Patient Active Problem List   Diagnosis Date Noted  . Candidal skin infection 11/13/2015  . Obesity (BMI 30-39.9) 11/13/2014  . Back pain, chronic 05/08/2014  . Elevated blood sugar 05/08/2014  . Headache 10/30/2013  . Medicare annual wellness visit, subsequent 06/27/2012  . Hypertension  08/20/2011  . Hyperlipidemia 08/20/2011  . Osteoarthritis 08/20/2011    Joneen Boers PT, DPT   11/25/2015, 6:31 PM  [place] [facility] [address] [place], [address] Phone: [phone]   Fax:  [phone]  Name: Tina Howard MRN: [id number] Date of Birth: 29-Oct-1933

## 2015-11-26 ENCOUNTER — Ambulatory Visit (INDEPENDENT_AMBULATORY_CARE_PROVIDER_SITE_OTHER): Payer: Medicare Other | Admitting: Podiatry

## 2015-11-26 ENCOUNTER — Encounter: Payer: Self-pay | Admitting: Podiatry

## 2015-11-26 DIAGNOSIS — B351 Tinea unguium: Secondary | ICD-10-CM

## 2015-11-26 DIAGNOSIS — Q828 Other specified congenital malformations of skin: Secondary | ICD-10-CM | POA: Diagnosis not present

## 2015-11-26 DIAGNOSIS — M79676 Pain in unspecified toe(s): Secondary | ICD-10-CM

## 2015-11-26 NOTE — Progress Notes (Signed)
She presents today with chief complaint of painful elongated toenails and thick calluses plantarly.  Objective: Vital signs are stable she's alert and oriented 3 pulses are palpable. Porokeratosis plantar aspect of the bilateral foot do not demonstrate any type of infection. Otherwise nails are thick yellow dystrophic likely mycotic and painful upon debridement.  Assessment: Pain in limb secondary to onychomycosis and porokeratosis bilateral.  Plan: Debridement of all reactive hyperkeratotic tissue and debridement of toenails thick and painful bilateral. Follow up with her in 2-3 months.

## 2015-11-27 ENCOUNTER — Ambulatory Visit: Payer: Medicare Other | Attending: Internal Medicine

## 2015-11-27 DIAGNOSIS — M25512 Pain in left shoulder: Secondary | ICD-10-CM | POA: Diagnosis not present

## 2015-11-27 DIAGNOSIS — M19011 Primary osteoarthritis, right shoulder: Secondary | ICD-10-CM | POA: Diagnosis not present

## 2015-11-27 DIAGNOSIS — R531 Weakness: Secondary | ICD-10-CM | POA: Insufficient documentation

## 2015-11-27 DIAGNOSIS — M19012 Primary osteoarthritis, left shoulder: Secondary | ICD-10-CM | POA: Diagnosis not present

## 2015-11-27 DIAGNOSIS — M25511 Pain in right shoulder: Secondary | ICD-10-CM | POA: Insufficient documentation

## 2015-11-27 NOTE — Therapy (Signed)
[place] [facility] [address] [place], [address] Phone: [phone]   Fax:  [phone]  Physical Therapy Treatment  Patient Details  Name: Tina Howard MRN: [id number] Date of Birth: 1933/01/31 Referring Provider: Jackolyn Confer, MD  Encounter Date: 11/27/2015      PT End of Session - 11/27/15 1605    Visit Number 4   Number of Visits 17   Date for PT Re-Evaluation 01/15/16   Authorization Type 4   Authorization Time Period of 10   PT Start Time 1605   PT Stop Time 1655   PT Time Calculation (min) 50 min   Activity Tolerance Patient tolerated treatment well   Behavior During Therapy [facility] for tasks assessed/performed      Past Medical History  Diagnosis Date  . Allergic rhinitis   . Hypercholesteremia   . History of pelvic mass     s/p resection, benign  . Hypertension   . Varicella zoster 2007  . Osteoporosis     per pt report on Medical Screening Form     Past Surgical History  Procedure Laterality Date  . Total knee arthroplasty      left  . Vaginal hysterectomy      benign reasons  . Knee arthroscopy      right  . Cataract extraction Bilateral 2014    Dr. Sandra Cockayne  . Foot surgery      There were no vitals filed for this visit.  Visit Diagnosis:  Osteoarthritis of both shoulders, unspecified osteoarthritis type  Pain of both shoulder joints  Weakness      Subjective Assessment - 11/27/15 1605    Subjective Shoulders are not at bad as it was. R shoulder 2/10 at rest, 5/10 when she reaches to the R side. L shoulder is fine.   Pertinent History Bilateral shoulder pain began years ago with her L shoulder pain occurring first, followed by her R shoulder. Patient is R hand dominant. Difficulty reaching with her R arm to hang stuff in her closet. Reached for an article of clothing 3 weeks ago from her closet and her R shoulder bothered her. Went to her MD who prescribed physical  therapy and Meloxicam (daily) last Thursday.  Melixicam helps.  Pt desires not to have shoulder surgery.   Has not yet had physical therapy for her shoulder.    Patient Stated Goals Not to have surgery. Be able to use her R arm and not have pain or injure it more than it probably is.    Currently in Pain? Yes   Pain Score 5    Multiple Pain Sites No        Objectives:  Manual therapy Supine posterior and inferior glide to R shoulder grade 3- to 3 with R shoulder in 80-90 degrees abduction  Soft tissue mobilization to R terres major Soft tissue mobilization to R rhomboid major and minor (improved ability to raise R arm)  There-ex  Directed patient with standing L shoulder extension resisting red band 10x3 (improved ability to bring R arm behind head). Reviewed and given as part of her HEP. Pt demonstrated and verbalized understanding  bilateral shoulder ER resisting yellow band 10x3   Standing bilateral scapular retraction resisting red band 10x3 with 5 second holds  Standing R shoulder IR resisting yellow band 10x3   Improved exercise technique, movement at target joints, use of target muscles after mod verbal, visual, tactile cues.    Improved  ability to raise R shoulder after decreasing tension to R rhomboid muscles, and increasing activation of L rhomboid muscles, and promoting R glenohumeral movement.                 PT Education - 11/27/15 1724    Education provided Yes   Education Details ther-ex, HEP   Person(s) Educated Patient   Methods Explanation;Demonstration;Tactile cues;Verbal cues;Handout   Comprehension Verbalized understanding;Returned demonstration             PT Long Term Goals - 11/17/15 1322    PT LONG TERM GOAL #1   Title Patient will have a decrease in R shoulder pain to 5/10 or less at worst to promote ability to raise her arm, reach, don and doff clothes   Time 8   Period Weeks   Status New   PT LONG TERM GOAL #2   Title  Patient will improve bilateral shoulder flexion and abduction by at least 30 degrees to promote ability to raise her arm, reach behind her head   Time 8   Period Weeks   Status New   PT LONG TERM GOAL #3   Title Patient will report decreased difficulty donning and doffing clothes as a demonstration of improved function.    Time 8   Period Weeks   Status New   PT LONG TERM GOAL #4   Title Patient will improve bilateral shoulder ER strength by at least 1/2 MMT grade to promote ability to raise her arms.    Time 8   Period Weeks   Status New   PT LONG TERM GOAL #5   Title --   Time --   Period --   Status --               Plan - 11/27/15 1604    Clinical Impression Statement Improved ability to raise R shoulder after decreasing tension to R rhomboid muscles, and increasing activation of L rhomboid muscles, and promoting R glenohumeral movement.    Pt will benefit from skilled therapeutic intervention in order to improve on the following deficits Pain;Decreased strength;Improper body mechanics;Decreased range of motion   Rehab Potential Fair   Clinical Impairments Affecting Rehab Potential age, pain, chronicity of condition   PT Frequency 2x / week   PT Duration 8 weeks   PT Treatment/Interventions Therapeutic exercise;Manual techniques;Therapeutic activities;Iontophoresis 4mg /ml Dexamethasone;Electrical Stimulation;Patient/family education;Neuromuscular re-education;Ultrasound   PT Next Visit Plan manual therapy, joint mobilizatoin (if appropriate), soft tissue mobilization, scapular strengthening, thoracic extension, rotator cuff strengthening, glenohumeral control   Consulted and Agree with Plan of Care Patient        Problem List Patient Active Problem List   Diagnosis Date Noted  . Candidal skin infection 11/13/2015  . Obesity (BMI 30-39.9) 11/13/2014  . Back pain, chronic 05/08/2014  . Elevated blood sugar 05/08/2014  . Headache 10/30/2013  . Medicare annual  wellness visit, subsequent 06/27/2012  . Hypertension 08/20/2011  . Hyperlipidemia 08/20/2011  . Osteoarthritis 08/20/2011     Joneen Boers PT, DPT  11/27/2015, 5:25 PM  [place] [facility] [address] [place], [address] Phone: [phone]   Fax:  [phone]  Name: Tina Howard MRN: [id number] Date of Birth: 22-Feb-1933

## 2015-11-27 NOTE — Patient Instructions (Signed)
Strengthening: Resisted Extension    Hold tubing in left hand, arm forward. Pull arm back, elbow straight. Squeeze shoulder blade. Repeat __10__ times per set. Do _3___ sets per session. Do _1___ sessions per day.  http://orth.exer.us/833   Copyright  VHI. All rights reserved.

## 2015-12-02 ENCOUNTER — Ambulatory Visit: Payer: Medicare Other

## 2015-12-02 DIAGNOSIS — M25511 Pain in right shoulder: Secondary | ICD-10-CM | POA: Diagnosis not present

## 2015-12-02 DIAGNOSIS — M25512 Pain in left shoulder: Secondary | ICD-10-CM

## 2015-12-02 DIAGNOSIS — M19012 Primary osteoarthritis, left shoulder: Secondary | ICD-10-CM | POA: Diagnosis not present

## 2015-12-02 DIAGNOSIS — M19011 Primary osteoarthritis, right shoulder: Secondary | ICD-10-CM | POA: Diagnosis not present

## 2015-12-02 DIAGNOSIS — R531 Weakness: Secondary | ICD-10-CM | POA: Diagnosis not present

## 2015-12-02 NOTE — Therapy (Signed)
[place] [facility] [address] [place], [address] Phone: [phone]   Fax:  [phone]  Physical Therapy Treatment  Patient Details  Name: Tina Howard MRN: [id number] Date of Birth: 02/21/1933 Referring Provider: Jackolyn Confer, MD  Encounter Date: 12/02/2015      PT End of Session - 12/02/15 1303    Visit Number 5   Number of Visits 17   Date for PT Re-Evaluation 01/15/16   Authorization Type 5   Authorization Time Period of 10   PT Start Time 1303   PT Stop Time 1349   PT Time Calculation (min) 46 min   Activity Tolerance Patient tolerated treatment well   Behavior During Therapy [facility] for tasks assessed/performed      Past Medical History  Diagnosis Date  . Allergic rhinitis   . Hypercholesteremia   . History of pelvic mass     s/p resection, benign  . Hypertension   . Varicella zoster 2007  . Osteoporosis     per pt report on Medical Screening Form     Past Surgical History  Procedure Laterality Date  . Total knee arthroplasty      left  . Vaginal hysterectomy      benign reasons  . Knee arthroscopy      right  . Cataract extraction Bilateral 2014    Dr. Sandra Cockayne  . Foot surgery      There were no vitals filed for this visit.  Visit Diagnosis:  Osteoarthritis of both shoulders, unspecified osteoarthritis type  Pain of both shoulder joints  Weakness      Subjective Assessment - 12/02/15 1304    Subjective 6/10 R shoulder pain when raising her arm. L shoulder feels stiff   Pertinent History Bilateral shoulder pain began years ago with her L shoulder pain occurring first, followed by her R shoulder. Patient is R hand dominant. Difficulty reaching with her R arm to hang stuff in her closet. Reached for an article of clothing 3 weeks ago from her closet and her R shoulder bothered her. Went to her MD who prescribed physical therapy and Meloxicam (daily) last Thursday.   Melixicam helps.  Pt desires not to have shoulder surgery.   Has not yet had physical therapy for her shoulder.    Patient Stated Goals Not to have surgery. Be able to use her R arm and not have pain or injure it more than it probably is.    Currently in Pain? Yes   Pain Score 6    Multiple Pain Sites No        Objectives:  There-ex Directed patient with L shoulder extension with scapular retraction resisting yellow band 10x5 seconds,    then red band 10x5 seconds for 2 sets bilateral shoulder ER resisting yellow band 10x2,    then with red band 5x2 (improved ability to raise R arm with less discomfort) Standing bilateral shoulder extension resisting red band 10x2 with 5 second holds    then with green band 5x5 seconds for 2 sets Standing R shoulder IR resisting red band 10x3 Seated R shoulder ER with arm propped in about 90 degrees scaption 8x resisting 1 lb (some discomfort afterwards which decreased with rest)  Manual therapy: Seated with R shoulder propped in about 90 degrees scaption: posterior and inferior glide grade 3- (slight improvement with R shoulder discomfort with flexion)   Improved exercise technique, movement at target joints, use of target muscles after  mod verbal, visual, tactile cues.    Improved ability to raise R arm up with less discomfort after L rhomboid muscle, R ER muscle activation. Upgraded level of resistance with today's exercises.              PT Education - 12/02/15 1311    Education provided Yes   Education Details ther-ex   [employer]) Educated Patient   Methods Explanation;Demonstration;Tactile cues;Verbal cues   Comprehension Verbalized understanding;Returned demonstration             PT Long Term Goals - 11/17/15 1322    PT LONG TERM GOAL #1   Title Patient will have a decrease in R shoulder pain to 5/10 or less at worst to promote ability to raise her arm, reach, don and doff clothes   Time 8   Period Weeks   Status New    PT LONG TERM GOAL #2   Title Patient will improve bilateral shoulder flexion and abduction by at least 30 degrees to promote ability to raise her arm, reach behind her head   Time 8   Period Weeks   Status New   PT LONG TERM GOAL #3   Title Patient will report decreased difficulty donning and doffing clothes as a demonstration of improved function.    Time 8   Period Weeks   Status New   PT LONG TERM GOAL #4   Title Patient will improve bilateral shoulder ER strength by at least 1/2 MMT grade to promote ability to raise her arms.    Time 8   Period Weeks   Status New   PT LONG TERM GOAL #5   Title --   Time --   Period --   Status --               Plan - 12/02/15 1302    Clinical Impression Statement Improved ability to raise R arm up with less discomfort after L rhomboid muscle, R ER muscle activation. Upgraded level of resistance with today's exercises.   Pt will benefit from skilled therapeutic intervention in order to improve on the following deficits Pain;Decreased strength;Improper body mechanics;Decreased range of motion   Rehab Potential Fair   Clinical Impairments Affecting Rehab Potential age, pain, chronicity of condition   PT Frequency 2x / week   PT Duration 8 weeks   PT Treatment/Interventions Therapeutic exercise;Manual techniques;Therapeutic activities;Iontophoresis 4mg /ml Dexamethasone;Electrical Stimulation;Patient/family education;Neuromuscular re-education;Ultrasound   PT Next Visit Plan manual therapy, joint mobilizatoin (if appropriate), soft tissue mobilization, scapular strengthening, thoracic extension, rotator cuff strengthening, glenohumeral control   Consulted and Agree with Plan of Care Patient        Problem List Patient Active Problem List   Diagnosis Date Noted  . Candidal skin infection 11/13/2015  . Obesity (BMI 30-39.9) 11/13/2014  . Back pain, chronic 05/08/2014  . Elevated blood sugar 05/08/2014  . Headache 10/30/2013  .  Medicare annual wellness visit, subsequent 06/27/2012  . Hypertension 08/20/2011  . Hyperlipidemia 08/20/2011  . Osteoarthritis 08/20/2011   Joneen Boers PT, DPT   12/02/2015, 6:33 PM  [place] [facility] [address] [place], [address] Phone: [phone]   Fax:  [phone]  Name: Tina Howard MRN: [id number] Date of Birth: 12-10-1933

## 2015-12-02 NOTE — Patient Instructions (Signed)
Shoulder External / Internal Rotation, Elbows Bent    With elbows bent at 90 and close to body, move hands out, rotating at shoulders. Then move hands back to start, keeping elbows tucked. Repeat sequence __5__ times, for 3 sets resisting red band. Perform daily.  Copyright  VHI. All rights reserved.

## 2015-12-04 ENCOUNTER — Ambulatory Visit: Payer: Medicare Other

## 2015-12-04 DIAGNOSIS — R531 Weakness: Secondary | ICD-10-CM | POA: Diagnosis not present

## 2015-12-04 DIAGNOSIS — M25512 Pain in left shoulder: Secondary | ICD-10-CM | POA: Diagnosis not present

## 2015-12-04 DIAGNOSIS — M19012 Primary osteoarthritis, left shoulder: Principal | ICD-10-CM

## 2015-12-04 DIAGNOSIS — M25511 Pain in right shoulder: Secondary | ICD-10-CM

## 2015-12-04 DIAGNOSIS — M19011 Primary osteoarthritis, right shoulder: Secondary | ICD-10-CM

## 2015-12-04 NOTE — Therapy (Signed)
[place] [facility] [address] [place], [address] Phone: [phone]   Fax:  [phone]  Physical Therapy Treatment  Patient Details  Name: Tina Howard MRN: [id number] Date of Birth: [birth year] Referring Provider: Jackolyn Confer, MD  Encounter Date: 12/04/2015      PT End of Session - 12/04/15 0906    Visit Number 6   Number of Visits 17   Date for PT Re-Evaluation 01/15/16   Authorization Type 6   Authorization Time Period of 10   PT Start Time 0910  Pt talked to front desk person pertaining to insurance   PT Stop Time 0958   PT Time Calculation (min) 48 min   Activity Tolerance Patient tolerated treatment well   Behavior During Therapy [facility] for tasks assessed/performed      Past Medical History  Diagnosis Date  . Allergic rhinitis   . Hypercholesteremia   . History of pelvic mass     s/p resection, benign  . Hypertension   . Varicella zoster 2007  . Osteoporosis     per pt report on Medical Screening Form     Past Surgical History  Procedure Laterality Date  . Total knee arthroplasty      left  . Vaginal hysterectomy      benign reasons  . Knee arthroscopy      right  . Cataract extraction Bilateral 2014    Dr. Sandra Cockayne  . Foot surgery      There were no vitals filed for this visit.  Visit Diagnosis:  Osteoarthritis of both shoulders, unspecified osteoarthritis type  Pain of both shoulder joints  Weakness      Subjective Assessment - 12/04/15 0912    Subjective R shoulder hurts, did a lot of ironing for the past 2 days. 7-8/10 currenly   Pertinent History Bilateral shoulder pain began years ago with her L shoulder pain occurring first, followed by her R shoulder. Patient is R hand dominant. Difficulty reaching with her R arm to hang stuff in her closet. Reached for an article of clothing 3 weeks ago from her closet and her R shoulder bothered her. Went to her MD who  prescribed physical therapy and Meloxicam (daily) last Thursday.  Melixicam helps.  Pt desires not to have shoulder surgery.   Has not yet had physical therapy for her shoulder.    Patient Stated Goals Not to have surgery. Be able to use her R arm and not have pain or injure it more than it probably is.    Currently in Pain? Yes   Pain Score 8        Objectives:  Manual therapy  Soft tissue mobilization to R rhomboid muscle (decreased R anterior shoulder pain with flexion)   There-ex Directed patient with L shoulder extension with scapular retraction resisting red band 10x10 seconds, for 2 sets L S/L R shoulder ER 10x2 with 5 second holds Supine serratus reach R shoulder 10x5 seconds Supine AAROM R shoulder flexion 10x2 Pt was also recommended to use L UE more to increase L scapular muscle use and decrease R rhomboid activation.    Improved exercise technique, movement at target joints, use of target muscles after mod verbal, visual, tactile cues.     R shoulder pain decreased to 4/10 after session                  PT Education - 12/04/15 0928    Education provided Yes  Education Details ther-ex   Person(s) Educated Patient   Methods Explanation;Demonstration;Tactile cues;Verbal cues   Comprehension Verbalized understanding;Returned demonstration             PT Long Term Goals - 11/17/15 1322    PT LONG TERM GOAL #1   Title Patient will have a decrease in R shoulder pain to 5/10 or less at worst to promote ability to raise her arm, reach, don and doff clothes   Time 8   Period Weeks   Status New   PT LONG TERM GOAL #2   Title Patient will improve bilateral shoulder flexion and abduction by at least 30 degrees to promote ability to raise her arm, reach behind her head   Time 8   Period Weeks   Status New   PT LONG TERM GOAL #3   Title Patient will report decreased difficulty donning and doffing clothes as a demonstration of improved function.     Time 8   Period Weeks   Status New   PT LONG TERM GOAL #4   Title Patient will improve bilateral shoulder ER strength by at least 1/2 MMT grade to promote ability to raise her arms.    Time 8   Period Weeks   Status New   PT LONG TERM GOAL #5   Title --   Time --   Period --   Status --               Plan - 12/04/15 0928    Clinical Impression Statement R shoulder pain decreased to 4/10 after session   Pt will benefit from skilled therapeutic intervention in order to improve on the following deficits Pain;Decreased strength;Improper body mechanics;Decreased range of motion   Rehab Potential Fair   Clinical Impairments Affecting Rehab Potential age, pain, chronicity of condition   PT Frequency 2x / week   PT Duration 8 weeks   PT Treatment/Interventions Therapeutic exercise;Manual techniques;Therapeutic activities;Iontophoresis 4mg /ml Dexamethasone;Electrical Stimulation;Patient/family education;Neuromuscular re-education;Ultrasound   PT Next Visit Plan manual therapy, joint mobilizatoin (if appropriate), soft tissue mobilization, scapular strengthening, thoracic extension, rotator cuff strengthening, glenohumeral control   Consulted and Agree with Plan of Care Patient        Problem List Patient Active Problem List   Diagnosis Date Noted  . Candidal skin infection 11/13/2015  . Obesity (BMI 30-39.9) 11/13/2014  . Back pain, chronic 05/08/2014  . Elevated blood sugar 05/08/2014  . Headache 10/30/2013  . Medicare annual wellness visit, subsequent 06/27/2012  . Hypertension 08/20/2011  . Hyperlipidemia 08/20/2011  . Osteoarthritis 08/20/2011    Joneen Boers PT, DPT   12/04/2015, 10:05 AM  [place] [facility] [address] [place], [address] Phone: [phone]   Fax:  [phone]  Name: Tina Howard MRN: [id number] Date of Birth: 1933-12-22

## 2015-12-09 ENCOUNTER — Ambulatory Visit: Payer: Medicare Other

## 2015-12-30 ENCOUNTER — Ambulatory Visit: Payer: Medicare Other | Attending: Internal Medicine

## 2015-12-30 DIAGNOSIS — R531 Weakness: Secondary | ICD-10-CM | POA: Diagnosis not present

## 2015-12-30 DIAGNOSIS — M25511 Pain in right shoulder: Secondary | ICD-10-CM | POA: Insufficient documentation

## 2015-12-30 DIAGNOSIS — M19012 Primary osteoarthritis, left shoulder: Secondary | ICD-10-CM | POA: Diagnosis not present

## 2015-12-30 DIAGNOSIS — M25512 Pain in left shoulder: Secondary | ICD-10-CM | POA: Diagnosis not present

## 2015-12-30 DIAGNOSIS — M19011 Primary osteoarthritis, right shoulder: Secondary | ICD-10-CM | POA: Insufficient documentation

## 2015-12-30 NOTE — Therapy (Signed)
[facility] [address], [place], [address] Phone: [phone]   Fax:  [phone]  Physical Therapy Treatment  Patient Details  Name: Tina Howard MRN: [id number] Date of Birth: 12/05/1933 Referring Provider: Jackolyn Confer, MD  Encounter Date: 12/30/2015      PT End of Session - 12/30/15 1616    Visit Number 7   Number of Visits 17   Date for PT Re-Evaluation 01/15/16   Authorization Type 7   Authorization Time Period of 10   PT Start Time 1430   PT Stop Time 1515   PT Time Calculation (min) 45 min   Activity Tolerance Patient tolerated treatment well   Behavior During Therapy [facility] for tasks assessed/performed      Past Medical History  Diagnosis Date  . Allergic rhinitis   . Hypercholesteremia   . History of pelvic mass     s/p resection, benign  . Hypertension   . Varicella zoster 2007  . Osteoporosis     per pt report on Medical Screening Form     Past Surgical History  Procedure Laterality Date  . Total knee arthroplasty      left  . Vaginal hysterectomy      benign reasons  . Knee arthroscopy      right  . Cataract extraction Bilateral 2014    Dr. Sandra Cockayne  . Foot surgery      There were no vitals filed for this visit.  Visit Diagnosis:  Osteoarthritis of both shoulders, unspecified osteoarthritis type  Pain of both shoulder joints      Subjective Assessment - 12/30/15 1437    Subjective Pt reports continued bilateral shoulder pain. She has been up in [place] taking care of her sister who just had knee replacement. Pt reports she has not been performing HEP because of taking care of sister. No specific questions or concerns.    Pertinent History Bilateral shoulder pain began years ago with her L shoulder pain occurring first, followed by her R shoulder. Patient is R hand dominant. Difficulty reaching with her R arm to hang stuff in her closet. Reached for an  article of clothing 3 weeks ago from her closet and her R shoulder bothered her. Went to her MD who prescribed physical therapy and Meloxicam (daily) last Thursday.  Melixicam helps.  Pt desires not to have shoulder surgery.   Has not yet had physical therapy for her shoulder.    Patient Stated Goals Not to have surgery. Be able to use her R arm and not have pain or injure it more than it probably is.    Currently in Pain? Yes  Pt does not rate, gets distracted in conversationa nd won't give rating   Pain Location Shoulder   Pain Orientation Right        THER-EX Directed patient with L shoulder extension with scapular retraction resisting red band 2 x 10; Standing serratus wall push-up plus 2 x 10; Standing low rows RTB 2 x 10; L S/L R shoulder ER 2 x 10 with 3 second holds; Supine serratus reach R shoulder 10x5 seconds Supine R shoulder rhythmic stabilization at elbow 30 seconds x 2;  MANUAL THERAPY R shoulder AP grade II mobilizations, 20 seconds/bout x 3 bouts; R shoulder inferior grade II mobilizations, 20 seconds/bout x 3 bouts; R shoulder distraction moving gently through scaption ROM;  PT Education - 12/30/15 1615    Education provided Yes   Education Details Continue HEP   Person(s) Educated Patient   Methods Explanation   Comprehension Verbalized understanding             PT Long Term Goals - 11/17/15 1322    PT LONG TERM GOAL #1   Title Patient will have a decrease in R shoulder pain to 5/10 or less at worst to promote ability to raise her arm, reach, don and doff clothes   Time 8   Period Weeks   Status New   PT LONG TERM GOAL #2   Title Patient will improve bilateral shoulder flexion and abduction by at least 30 degrees to promote ability to raise her arm, reach behind her head   Time 8   Period Weeks   Status New   PT LONG TERM GOAL #3   Title Patient will report decreased difficulty donning and doffing clothes  as a demonstration of improved function.    Time 8   Period Weeks   Status New   PT LONG TERM GOAL #4   Title Patient will improve bilateral shoulder ER strength by at least 1/2 MMT grade to promote ability to raise her arms.    Time 8   Period Weeks   Status New   PT LONG TERM GOAL #5   Title --   Time --   Period --   Status --               Plan - 12/30/15 1617    Clinical Impression Statement Pt reports continued bilateral shoulder pain but improves following therapy session. Pt encouraged to continue HEP. She is motivated to improve as she wants to avoid shoulder surgery at all cost. Pt encouraged to continue HEP and follow-up as scheduled.    Pt will benefit from skilled therapeutic intervention in order to improve on the following deficits Pain;Decreased strength;Improper body mechanics;Decreased range of motion   Rehab Potential Fair   Clinical Impairments Affecting Rehab Potential age, pain, chronicity of condition   PT Frequency 2x / week   PT Duration 8 weeks   PT Treatment/Interventions Therapeutic exercise;Manual techniques;Therapeutic activities;Iontophoresis 4mg /ml Dexamethasone;Electrical Stimulation;Patient/family education;Neuromuscular re-education;Ultrasound   PT Next Visit Plan manual therapy, joint mobilizatoin (if appropriate), soft tissue mobilization, scapular strengthening, thoracic extension, rotator cuff strengthening, glenohumeral control   PT Home Exercise Plan As prescribed   Consulted and Agree with Plan of Care Patient        Problem List Patient Active Problem List   Diagnosis Date Noted  . Candidal skin infection 11/13/2015  . Obesity (BMI 30-39.9) 11/13/2014  . Back pain, chronic 05/08/2014  . Elevated blood sugar 05/08/2014  . Headache 10/30/2013  . Medicare annual wellness visit, subsequent 06/27/2012  . Hypertension 08/20/2011  . Hyperlipidemia 08/20/2011  . Osteoarthritis 08/20/2011   Phillips Grout PT, DPT    Huprich,Jason 12/30/2015, 4:19 PM  [facility] [address], [place], [address] Phone: [phone]   Fax:  [phone]  Name: Tina Howard MRN: [id number] Date of Birth: [birth year]

## 2015-12-31 ENCOUNTER — Ambulatory Visit (INDEPENDENT_AMBULATORY_CARE_PROVIDER_SITE_OTHER): Payer: Medicare Other | Admitting: Internal Medicine

## 2015-12-31 ENCOUNTER — Encounter: Payer: Self-pay | Admitting: Internal Medicine

## 2015-12-31 VITALS — BP 128/71 | HR 63 | Temp 97.5°F | Ht 61.0 in | Wt 198.0 lb

## 2015-12-31 DIAGNOSIS — M19011 Primary osteoarthritis, right shoulder: Secondary | ICD-10-CM | POA: Diagnosis not present

## 2015-12-31 DIAGNOSIS — M19012 Primary osteoarthritis, left shoulder: Secondary | ICD-10-CM | POA: Diagnosis not present

## 2015-12-31 DIAGNOSIS — R109 Unspecified abdominal pain: Secondary | ICD-10-CM | POA: Diagnosis not present

## 2015-12-31 DIAGNOSIS — I10 Essential (primary) hypertension: Secondary | ICD-10-CM | POA: Diagnosis not present

## 2015-12-31 DIAGNOSIS — E785 Hyperlipidemia, unspecified: Secondary | ICD-10-CM | POA: Diagnosis not present

## 2015-12-31 LAB — URINALYSIS, ROUTINE W REFLEX MICROSCOPIC
Bilirubin Urine: NEGATIVE
Hgb urine dipstick: NEGATIVE
Ketones, ur: NEGATIVE
Leukocytes, UA: NEGATIVE
Nitrite: NEGATIVE
PH: 7 (ref 5.0–8.0)
SPECIFIC GRAVITY, URINE: 1.015 (ref 1.000–1.030)
Total Protein, Urine: NEGATIVE
Urine Glucose: NEGATIVE
Urobilinogen, UA: 0.2 (ref 0.0–1.0)

## 2015-12-31 NOTE — Assessment & Plan Note (Signed)
Symptoms improved with PT and Meloxicam. Will continue.

## 2015-12-31 NOTE — Assessment & Plan Note (Signed)
BP Readings from Last 3 Encounters:  12/31/15 128/71  11/13/15 116/69  09/15/15 130/62   BP well controlled. Continue current medications.

## 2015-12-31 NOTE — Assessment & Plan Note (Signed)
Continue Simvastatin. Follow up LFTs and lipids in 3 months.

## 2015-12-31 NOTE — Patient Instructions (Addendum)
Continue current medications.  Do not take Ibuprofen or Aleve while on Meloxicam.  Start Vit D 2000units daily.  Follow up in 3 months.

## 2015-12-31 NOTE — Addendum Note (Signed)
Addended by: Karlene Einstein D on: 12/31/2015 03:19 PM   Modules accepted: Orders

## 2015-12-31 NOTE — Assessment & Plan Note (Signed)
Right flank pain intermittent fleeting. Low suspicion of nephrolithiasis. However, will check urinalysis. Most likely, symptoms related to muscular strain. Will monitor for any persistent or worsening symptoms.

## 2015-12-31 NOTE — Progress Notes (Signed)
Pre visit review using our clinic review tool, if applicable. No additional management support is needed unless otherwise documented below in the visit note. 

## 2015-12-31 NOTE — Progress Notes (Signed)
Subjective:    Patient ID: Tina Howard, female    DOB: 07-15-1933, 82 y.o.   MRN: [id number]  HPI  82YO female presents for follow up.  Last visit 10/2015 started on Meloxicam for shoulder pain.  Shoulder pain - Some improvement with Meloxicam. Stopped PT during Holidays. Started back with PT. Right arm worse than left with decreased ROM. Not completely resolved, however still adamant about avoiding surgery. Also started on Voltaren by Dr. Milinda Pointer. Using 1-2 times daily.  Very cautious about falling.  HTN - Compliant with medication. No CP, HA, palpitations.  She also notes some occasional fleeting right flank pain. Worse with movement or deep breath. Comes and goes over a few seconds. No persistent pain, cough, no urinary symptoms. No fever, chills.  Wt Readings from Last 3 Encounters:  12/31/15 198 lb (89.812 kg)  11/13/15 195 lb 6 oz (88.622 kg)  09/15/15 196 lb (88.905 kg)   BP Readings from Last 3 Encounters:  12/31/15 128/71  11/13/15 116/69  09/15/15 130/62    Past Medical History  Diagnosis Date  . Allergic rhinitis   . Hypercholesteremia   . History of pelvic mass     s/p resection, benign  . Hypertension   . Varicella zoster 2007  . Osteoporosis     per pt report on Medical Screening Form    Family History  Problem Relation Age of Onset  . Heart disease Father    Past Surgical History  Procedure Laterality Date  . Total knee arthroplasty      left  . Vaginal hysterectomy      benign reasons  . Knee arthroscopy      right  . Cataract extraction Bilateral 2014    Dr. Sandra Cockayne  . Foot surgery     Social History   Social History  . Marital Status: Single    Spouse Name: N/A  . Number of Children: N/A  . Years of Education: N/A   Social History Main Topics  . Smoking status: Never Smoker   . Smokeless tobacco: Never Used  . Alcohol Use: No  . Drug Use: No  . Sexual Activity: Not Asked   Other Topics Concern  . None   Social History  Narrative    Review of Systems  Constitutional: Negative for fever, chills, appetite change, fatigue and unexpected weight change.  Eyes: Negative for visual disturbance.  Respiratory: Negative for shortness of breath and wheezing.   Cardiovascular: Negative for chest pain, palpitations and leg swelling.  Gastrointestinal: Negative for abdominal pain, diarrhea and constipation.  Musculoskeletal: Positive for myalgias, back pain and arthralgias.  Skin: Negative for color change and rash.  Hematological: Negative for adenopathy. Does not bruise/bleed easily.  Psychiatric/Behavioral: Negative for sleep disturbance and dysphoric mood. The patient is not nervous/anxious.        Objective:    BP 128/71 mmHg  Pulse 63  Temp(Src) 97.5 F (36.4 C) (Oral)  Ht 5\' 1"  (1.549 m)  Wt 198 lb (89.812 kg)  BMI 37.43 kg/m2  SpO2 97% Physical Exam  Constitutional: She is oriented to person, place, and time. She appears well-developed and well-nourished. No distress.  HENT:  Head: Normocephalic and atraumatic.  Right Ear: External ear normal.  Left Ear: External ear normal.  Nose: Nose normal.  Mouth/Throat: Oropharynx is clear and moist. No oropharyngeal exudate.  Eyes: Conjunctivae are normal. Pupils are equal, round, and reactive to light. Right eye exhibits no discharge. Left eye exhibits no discharge. No scleral  icterus.  Neck: Normal range of motion. Neck supple. No tracheal deviation present. No thyromegaly present.  Cardiovascular: Normal rate, regular rhythm, normal heart sounds and intact distal pulses.  Exam reveals no gallop and no friction rub.   No murmur heard. Pulmonary/Chest: Effort normal and breath sounds normal. No respiratory distress. She has no wheezes. She has no rales. She exhibits no tenderness.  Musculoskeletal: She exhibits no edema.       Right shoulder: She exhibits decreased range of motion, tenderness, pain and decreased strength.  Lymphadenopathy:    She has no  cervical adenopathy.  Neurological: She is alert and oriented to person, place, and time. No cranial nerve deficit. She exhibits normal muscle tone. Coordination normal.  Skin: Skin is warm and dry. No rash noted. She is not diaphoretic. No erythema. No pallor.  Psychiatric: She has a normal mood and affect. Her behavior is normal. Judgment and thought content normal.          Assessment & Plan:   Problem List Items Addressed This Visit      Unprioritized   Hyperlipidemia    Continue Simvastatin. Follow up LFTs and lipids in 3 months.      Hypertension    BP Readings from Last 3 Encounters:  12/31/15 128/71  11/13/15 116/69  09/15/15 130/62   BP well controlled. Continue current medications.      Osteoarthritis - Primary    Symptoms improved with PT and Meloxicam. Will continue.      Right flank pain    Right flank pain intermittent fleeting. Low suspicion of nephrolithiasis. However, will check urinalysis. Most likely, symptoms related to muscular strain. Will monitor for any persistent or worsening symptoms.      Relevant Orders   POCT Urinalysis Dipstick       Return in about 3 months (around 03/30/2016) for Recheck.

## 2016-01-03 ENCOUNTER — Other Ambulatory Visit: Payer: Self-pay | Admitting: Internal Medicine

## 2016-01-06 ENCOUNTER — Ambulatory Visit: Payer: Medicare Other

## 2016-01-06 DIAGNOSIS — R531 Weakness: Secondary | ICD-10-CM

## 2016-01-06 DIAGNOSIS — M25512 Pain in left shoulder: Secondary | ICD-10-CM | POA: Diagnosis not present

## 2016-01-06 DIAGNOSIS — M19011 Primary osteoarthritis, right shoulder: Secondary | ICD-10-CM

## 2016-01-06 DIAGNOSIS — M19012 Primary osteoarthritis, left shoulder: Principal | ICD-10-CM

## 2016-01-06 DIAGNOSIS — M25511 Pain in right shoulder: Secondary | ICD-10-CM | POA: Diagnosis not present

## 2016-01-06 NOTE — Patient Instructions (Signed)
   On your back, with your right arm flexed to 90 degrees: push shoulder blade up towards the ceiling. Hold for 5 seconds, repeat 10 times, perform 3 sets daily.

## 2016-01-06 NOTE — Therapy (Signed)
[facility] [address] [place], [address] Phone: [phone]   Fax:  [phone]  Physical Therapy Treatment  Patient Details  Name: Tina Howard MRN: [id number] Date of Birth: May 26, 1933 Referring Provider: Jackolyn Confer, MD  Encounter Date: 01/06/2016      PT End of Session - 01/06/16 1437    Visit Number 8   Number of Visits 17   Date for PT Re-Evaluation 01/15/16   Authorization Type 8   Authorization Time Period of 10   PT Start Time 1437   PT Stop Time 1519   PT Time Calculation (min) 42 min   Activity Tolerance Patient tolerated treatment well   Behavior During Therapy [facility] for tasks assessed/performed      Past Medical History  Diagnosis Date  . Allergic rhinitis   . Hypercholesteremia   . History of pelvic mass     s/p resection, benign  . Hypertension   . Varicella zoster 2007  . Osteoporosis     per pt report on Medical Screening Form     Past Surgical History  Procedure Laterality Date  . Total knee arthroplasty      left  . Vaginal hysterectomy      benign reasons  . Knee arthroscopy      right  . Cataract extraction Bilateral 2014    Dr. Sandra Cockayne  . Foot surgery      There were no vitals filed for this visit.  Visit Diagnosis:  Osteoarthritis of both shoulders, unspecified osteoarthritis type  Pain of both shoulder joints  Weakness      Subjective Assessment - 01/06/16 1440    Subjective Pt states not being as good with her shoulder while helping her sister up [place]. 5/10 R shoulder when raising her arm. Pt states putting her coat, sweater, shirt on is getting easier    Pertinent History Bilateral shoulder pain began years ago with her L shoulder pain occurring first, followed by her R shoulder. Patient is R hand dominant. Difficulty reaching with her R arm to hang stuff in her closet. Reached for an article of clothing 3 weeks ago from her closet and her  R shoulder bothered her. Went to her MD who prescribed physical therapy and Meloxicam (daily) last Thursday.  Melixicam helps.  Pt desires not to have shoulder surgery.   Has not yet had physical therapy for her shoulder.    Patient Stated Goals Not to have surgery. Be able to use her R arm and not have pain or injure it more than it probably is.    Currently in Pain? Yes   Pain Score 5       Objectives:  Manual therapy  Supine with R shoulder in abduction: posterior, inferior, posterior inferior glide grade 3- to 3 (slight decrease in symptoms with shoulder flexion)   There-ex  Supine R shoulder scapular protraction 10x3 (decreased R shoulder pain with flexion) Standing L shoulder extension resisting red band with palms facing forward 10x3 with 5 seconds  Standing bilateral shoulder ER resisting yellow band 10x   Then with red band 10x2 Ball wall push-ups 10x Reviewed HEP with pt. Please see pt instructions.   Improved exercise technique, movement at target joints, use of target muscles after min to mod verbal, visual, tactile cues.     Decreased R shoulder pain with flexion after activating serratus anterior muscles as well.  PT Education - 01/06/16 1459    Education provided Yes   Education Details ther-ex, HEP   Person(s) Educated Patient   Methods Explanation;Demonstration;Tactile cues;Verbal cues;Handout   Comprehension Verbalized understanding;Returned demonstration             PT Long Term Goals - 01/06/16 1629    PT LONG TERM GOAL #1   Title Patient will have a decrease in R shoulder pain to 5/10 or less at worst to promote ability to raise her arm, reach, don and doff clothes   Time 8   Period Weeks   Status On-going   PT LONG TERM GOAL #2   Title Patient will improve bilateral shoulder flexion and abduction by at least 30 degrees to promote ability to raise her arm, reach behind her head   Time 8   Period  Weeks   Status On-going   PT LONG TERM GOAL #3   Title Patient will report decreased difficulty donning and doffing clothes as a demonstration of improved function.    Time 8   Period Weeks   Status Achieved   PT LONG TERM GOAL #4   Title Patient will improve bilateral shoulder ER strength by at least 1/2 MMT grade to promote ability to raise her arms.    Time 8   Period Weeks   Status On-going               Plan - 01/06/16 1459    Clinical Impression Statement Decreased R shoulder pain with flexion after activating serratus anterior muscles as well.   Pt will benefit from skilled therapeutic intervention in order to improve on the following deficits Pain;Decreased strength;Improper body mechanics;Decreased range of motion   Rehab Potential Fair   Clinical Impairments Affecting Rehab Potential age, pain, chronicity of condition   PT Frequency 2x / week   PT Duration 8 weeks   PT Treatment/Interventions Therapeutic exercise;Manual techniques;Therapeutic activities;Iontophoresis 4mg /ml Dexamethasone;Electrical Stimulation;Patient/family education;Neuromuscular re-education;Ultrasound   PT Next Visit Plan manual therapy, joint mobilizatoin (if appropriate), soft tissue mobilization, scapular strengthening, thoracic extension, rotator cuff strengthening, glenohumeral control   PT Home Exercise Plan As prescribed   Consulted and Agree with Plan of Care Patient        Problem List Patient Active Problem List   Diagnosis Date Noted  . Right flank pain 12/31/2015  . Candidal skin infection 11/13/2015  . Obesity (BMI 30-39.9) 11/13/2014  . Back pain, chronic 05/08/2014  . Elevated blood sugar 05/08/2014  . Medicare annual wellness visit, subsequent 06/27/2012  . Hypertension 08/20/2011  . Hyperlipidemia 08/20/2011  . Osteoarthritis 08/20/2011    Joneen Boers PT, DPT   01/06/2016, 4:46 PM  [facility] [address], [place], [address] Phone: [phone]   Fax:  [phone]  Name: ASHLYNNE MESKER MRN: [id number] Date of Birth: 11-08-1933

## 2016-01-08 ENCOUNTER — Ambulatory Visit: Payer: Medicare Other

## 2016-01-08 DIAGNOSIS — M19012 Primary osteoarthritis, left shoulder: Secondary | ICD-10-CM | POA: Diagnosis not present

## 2016-01-08 DIAGNOSIS — R531 Weakness: Secondary | ICD-10-CM

## 2016-01-08 DIAGNOSIS — M25511 Pain in right shoulder: Secondary | ICD-10-CM

## 2016-01-08 DIAGNOSIS — M25512 Pain in left shoulder: Secondary | ICD-10-CM

## 2016-01-08 DIAGNOSIS — M19011 Primary osteoarthritis, right shoulder: Secondary | ICD-10-CM | POA: Diagnosis not present

## 2016-01-08 NOTE — Therapy (Signed)
[place] [facility] [address], [place], [address] Phone: [phone]   Fax:  [phone]  Physical Therapy Treatment  Patient Details  Name: Tina Howard MRN: [id number] Date of Birth: 1933-01-09 Referring Provider: Jackolyn Confer, MD  Encounter Date: 01/08/2016      PT End of Session - 01/08/16 1433    Visit Number 9   Number of Visits 17   Date for PT Re-Evaluation 01/15/16   Authorization Type 9   Authorization Time Period of 10   PT Start Time 1433   PT Stop Time 1526   PT Time Calculation (min) 53 min   Activity Tolerance Patient tolerated treatment well   Behavior During Therapy [facility] for tasks assessed/performed      Past Medical History  Diagnosis Date  . Allergic rhinitis   . Hypercholesteremia   . History of pelvic mass     s/p resection, benign  . Hypertension   . Varicella zoster 2007  . Osteoporosis     per pt report on Medical Screening Form     Past Surgical History  Procedure Laterality Date  . Total knee arthroplasty      left  . Vaginal hysterectomy      benign reasons  . Knee arthroscopy      right  . Cataract extraction Bilateral 2014    Dr. Sandra Cockayne  . Foot surgery      There were no vitals filed for this visit.  Visit Diagnosis:  Osteoarthritis of both shoulders, unspecified osteoarthritis type  Pain of both shoulder joints  Weakness      Subjective Assessment - 01/08/16 1435    Subjective R shoulder is not as bad. 4-5/10 when raising her R arm. L shoulder not bothering her.    Pertinent History Bilateral shoulder pain began years ago with her L shoulder pain occurring first, followed by her R shoulder. Patient is R hand dominant. Difficulty reaching with her R arm to hang stuff in her closet. Reached for an article of clothing 3 weeks ago from her closet and her R shoulder bothered her. Went to her MD who prescribed physical therapy and Meloxicam (daily)  last Thursday.  Melixicam helps.  Pt desires not to have shoulder surgery.   Has not yet had physical therapy for her shoulder.    Patient Stated Goals Not to have surgery. Be able to use her R arm and not have pain or injure it more than it probably is.    Currently in Pain? Yes   Pain Score 5    Multiple Pain Sites No            [facility] PT Assessment - 01/08/16 1520    AROM   Right Shoulder Flexion 126 Degrees   Right Shoulder ABduction 116 Degrees   Left Shoulder Flexion 92 Degrees   Left Shoulder ABduction 88 Degrees   Strength   Right Shoulder External Rotation 4+/5   Left Shoulder External Rotation 4+/5      Objectives:  Manual therapy  Supine with R shoulder in abduction: posterior, inferior, posterior inferior glide grade 3- to 3  Soft tissue mobilization to L rhomboid muscles   Decreased R shoulder discomfort with flexion after manual therapy    There-ex  Reviewed importance of continuing HEP after PT sessions ends.  Directed patient with seated bilateral shoulder ER resisting red band 10x3 Scapular protraction resisting green band 10x with cues for no shoulder shrug, Push-up  plus at wall 10x2 High rows resisting red band 10x Low rows 10x Wall pectoralis stretch R UE 10 seconds x 3 R and L shoulder flexion, abduction AROM 1x each way Manually resisted R and L shoulder ER 1x Reviewed progress/current status with shoulder AROM and ER strength.     Improved exercise technique, movement at target joints, use of target muscles after mod verbal, visual, tactile cues.    Decreased R shoulder discomfort with flexion after manual therapy.                           PT Education - 01/08/16 1528    Education provided Yes   Education Details ther-ex, progress/current status with shoulder flexion, abduction AROM and ER strength   Person(s) Educated Patient   Methods Explanation;Demonstration;Tactile cues;Verbal cues   Comprehension Verbalized  understanding;Returned demonstration             PT Long Term Goals - 01/06/16 1629    PT LONG TERM GOAL #1   Title Patient will have a decrease in R shoulder pain to 5/10 or less at worst to promote ability to raise her arm, reach, don and doff clothes   Time 8   Period Weeks   Status On-going   PT LONG TERM GOAL #2   Title Patient will improve bilateral shoulder flexion and abduction by at least 30 degrees to promote ability to raise her arm, reach behind her head   Time 8   Period Weeks   Status On-going   PT LONG TERM GOAL #3   Title Patient will report decreased difficulty donning and doffing clothes as a demonstration of improved function.    Time 8   Period Weeks   Status Achieved   PT LONG TERM GOAL #4   Title Patient will improve bilateral shoulder ER strength by at least 1/2 MMT grade to promote ability to raise her arms.    Time 8   Period Weeks   Status On-going               Plan - 01/08/16 1530    Clinical Impression Statement Pt demonstrates overall improved R shoulder flexion, abduction AROM and bilateral ER muscle strength since initial evaluation. No difference with L shoulder AROM. Pt however stated that her L shoulder does not really bother her and PT sessions focused on the R shoulder secondary to that side bothering her a lot initially.  Decreased R shoulder discomfort with shoulder flexion and abduction after manual therapy and exercises.    Pt will benefit from skilled therapeutic intervention in order to improve on the following deficits Pain;Decreased strength;Improper body mechanics;Decreased range of motion   Rehab Potential Fair   Clinical Impairments Affecting Rehab Potential age, pain, chronicity of condition   PT Frequency 2x / week   PT Duration 8 weeks   PT Treatment/Interventions Therapeutic exercise;Manual techniques;Therapeutic activities;Iontophoresis 4mg /ml Dexamethasone;Electrical Stimulation;Patient/family education;Neuromuscular  re-education;Ultrasound   PT Next Visit Plan manual therapy, joint mobilizatoin (if appropriate), soft tissue mobilization, scapular strengthening, thoracic extension, rotator cuff strengthening, glenohumeral control   PT Home Exercise Plan As prescribed   Consulted and Agree with Plan of Care Patient        Problem List Patient Active Problem List   Diagnosis Date Noted  . Right flank pain 12/31/2015  . Candidal skin infection 11/13/2015  . Obesity (BMI 30-39.9) 11/13/2014  . Back pain, chronic 05/08/2014  . Elevated blood sugar 05/08/2014  .  Medicare annual wellness visit, subsequent 06/27/2012  . Hypertension 08/20/2011  . Hyperlipidemia 08/20/2011  . Osteoarthritis 08/20/2011    Joneen Boers PT, DPT   01/08/2016, 3:35 PM  [place] [facility] [address], [place], [address] Phone: [phone]   Fax:  [phone]  Name: LAWANNA SHIRAISHI MRN: [id number] Date of Birth: May 18, 1933

## 2016-01-13 ENCOUNTER — Ambulatory Visit: Payer: Medicare Other

## 2016-01-13 DIAGNOSIS — M19011 Primary osteoarthritis, right shoulder: Secondary | ICD-10-CM

## 2016-01-13 DIAGNOSIS — M25512 Pain in left shoulder: Secondary | ICD-10-CM | POA: Diagnosis not present

## 2016-01-13 DIAGNOSIS — R531 Weakness: Secondary | ICD-10-CM

## 2016-01-13 DIAGNOSIS — M25511 Pain in right shoulder: Secondary | ICD-10-CM

## 2016-01-13 DIAGNOSIS — M19012 Primary osteoarthritis, left shoulder: Secondary | ICD-10-CM | POA: Diagnosis not present

## 2016-01-13 NOTE — Patient Instructions (Signed)
External Rotation (Eccentric), Active - Side-Lying    Lie on side, affected arm on top, elbow bent to 90, towel under elbow. Lift forearm of affected arm. Slowly lower affected arm for 3-5 seconds. _10__ reps per set, _3__ sets per day  Use 1-2 lbs  http://ecce.exer.us/189   Copyright  VHI. All rights reserved.      Abduction (Side-Lying)    Lie on left side. Raise arm towards your head. Keep palm forward. Repeat ___10_ times per set. Do __3__ sets per session. Do ___1_ sessions per day.  http://orth.exer.us/935   Copyright  VHI. All rights reserved.

## 2016-01-13 NOTE — Therapy (Signed)
[place] [facility] [address], [place], [address] Phone: [phone]   Fax:  [phone]  Physical Therapy Treatment And Progress Report (11-17-2015 to 01-13-2016)  Patient Details  Name: Tina Howard MRN: [id number] Date of Birth: 07-12-1933 Referring Provider: Jackolyn Confer, MD  Encounter Date: 01/13/2016      PT End of Session - 01/13/16 1436    Visit Number 10   Number of Visits 17   Date for PT Re-Evaluation 01/15/16   Authorization Type 1   Authorization Time Period of 10   PT Start Time 1436   PT Stop Time 1523   PT Time Calculation (min) 47 min   Activity Tolerance Patient tolerated treatment well   Behavior During Therapy [facility] for tasks assessed/performed      Past Medical History  Diagnosis Date  . Allergic rhinitis   . Hypercholesteremia   . History of pelvic mass     s/p resection, benign  . Hypertension   . Varicella zoster 2007  . Osteoporosis     per pt report on Medical Screening Form     Past Surgical History  Procedure Laterality Date  . Total knee arthroplasty      left  . Vaginal hysterectomy      benign reasons  . Knee arthroscopy      right  . Cataract extraction Bilateral 2014    Dr. Sandra Cockayne  . Foot surgery      There were no vitals filed for this visit.  Visit Diagnosis:  Osteoarthritis of both shoulders, unspecified osteoarthritis type  Pain of both shoulder joints  Weakness      Subjective Assessment - 01/13/16 1438    Subjective 4-5/10 R shoulder pain currently. As the day goes on, her shoulder feels better. 6/10 R shoulder pain at worst. Feels more of a soreness now   Pertinent History Bilateral shoulder pain began years ago with her L shoulder pain occurring first, followed by her R shoulder. Patient is R hand dominant. Difficulty reaching with her R arm to hang stuff in her closet. Reached for an article of clothing 3 weeks ago from her closet  and her R shoulder bothered her. Went to her MD who prescribed physical therapy and Meloxicam (daily) last Thursday.  Melixicam helps.  Pt desires not to have shoulder surgery.   Has not yet had physical therapy for her shoulder.    Patient Stated Goals Not to have surgery. Be able to use her R arm and not have pain or injure it more than it probably is.    Currently in Pain? Yes   Pain Score 5    Multiple Pain Sites No            [facility] PT Assessment - 01/13/16 1502    AROM   Right Shoulder Flexion 130 Degrees  after activating serratus anterior muscles   Right Shoulder ABduction 130 Degrees  after S/L ER and S/L abduction exercises   Left Shoulder Flexion 91 Degrees   Left Shoulder ABduction 85 Degrees    Objectives  At beginning of session: AROM R shoulder flexion 110 degrees  AROM R shoulder abduction 110 degrees   There-ex  Directed patient with standing R shoulder flexion and abduction AROM multiple times  Supine R scapular protraction 10x10 second holds for 3 sets (R shoulder flexion AROM improved to 130 degrees. R shoulder abduction maintained at 110 degrees) S/L R shoulder ER resisting 2 lbs 10x3 (  abduction AROM improved to 130 degrees)  S/L R shoulder abduction 10x3 Reviewed HEP. Please see patient instructions. Patient demonstrated and verbalized understanding.   Reviewed progress/current status with R and L shoulder AROM, ER strength, and progress towards goals.     Improved exercise technique, movement at target joints, use of target muscles after mod verbal, visual, tactile cues.     Patient has demonstrated improved R shoulder flexion and abduction AROM after activating serratus anterior, infraspinatus/teres minor, and lateral deltoid muscles. Overall improved R shoulder AROM and ER strength since initial evaluation. L shoulder AROM the same secondary to emphasis given to R side due to that shoulder bothering her more. Pt making good progress towards goals and  would benefit from one more session of skilled physical therapy services to continue promoting AROM and function and preparation for discharge.                          PT Education - 01/13/16 1439    Education provided Yes   Education Details ther-ex, HEP   Person(s) Educated Patient   Methods Explanation;Demonstration;Tactile cues;Verbal cues   Comprehension Verbalized understanding;Returned demonstration             PT Long Term Goals - 01/13/16 1516    PT LONG TERM GOAL #1   Title Patient will have a decrease in R shoulder pain to 5/10 or less at worst to promote ability to raise her arm, reach, don and doff clothes   Time 8   Period Weeks   Status On-going   PT LONG TERM GOAL #2   Title Patient will improve bilateral shoulder flexion and abduction by at least 30 degrees to promote ability to raise her arm, reach behind her head   Time 8   Period Weeks   Status Partially Met   PT LONG TERM GOAL #3   Title Patient will report decreased difficulty donning and doffing clothes as a demonstration of improved function.    Time 8   Period Weeks   Status Achieved   PT LONG TERM GOAL #4   Title Patient will improve bilateral shoulder ER strength by at least 1/2 MMT grade to promote ability to raise her arms.    Time 8   Period Weeks   Status Achieved               Plan - 01/13/16 1506    Clinical Impression Statement Patient has demonstrated improved R shoulder flexion and abduction AROM after activating serratus anterior, infraspinatus/teres minor, and lateral deltoid muscles. Overall improved R shoulder AROM and ER strength since initial evaluation. L shoulder AROM the same secondary to emphasis given to R side due to that shoulder bothering her more. Pt making good progress towards goals and would benefit from one more session of skilled physical therapy services to continue promoting AROM and function and preparation for discharge.    Pt will  benefit from skilled therapeutic intervention in order to improve on the following deficits Pain;Decreased strength;Improper body mechanics;Decreased range of motion   Rehab Potential Fair   Clinical Impairments Affecting Rehab Potential age, pain, chronicity of condition   PT Frequency 2x / week   PT Duration 8 weeks   PT Treatment/Interventions Therapeutic exercise;Manual techniques;Therapeutic activities;Iontophoresis 31m/ml Dexamethasone;Electrical Stimulation;Patient/family education;Neuromuscular re-education;Ultrasound   PT Next Visit Plan manual therapy, joint mobilizatoin (if appropriate), soft tissue mobilization, scapular strengthening, thoracic extension, rotator cuff strengthening, glenohumeral control   PT Home Exercise  Plan --   Consulted and Agree with Plan of Care Patient          G-Codes - 01/29/2016 1537    Functional Assessment Tool Used Patient interview, clinical presentation   Functional Limitation Carrying, moving and handling objects   Carrying, Moving and Handling Objects Current Status [phone]) At least 20 percent but less than 40 percent impaired, limited or restricted   Carrying, Moving and Handling Objects Goal Status (K2706) At least 20 percent but less than 40 percent impaired, limited or restricted      Problem List Patient Active Problem List   Diagnosis Date Noted  . Right flank pain 12/31/2015  . Candidal skin infection 11/13/2015  . Obesity (BMI 30-39.9) 11/13/2014  . Back pain, chronic 05/08/2014  . Elevated blood sugar 05/08/2014  . Medicare annual wellness visit, subsequent 06/27/2012  . Hypertension 08/20/2011  . Hyperlipidemia 08/20/2011  . Osteoarthritis 08/20/2011  Thank you for your referral.   Joneen Boers PT, DPT   2016-01-29, 3:48 PM  [place] [facility] [address], [place], [address] Phone: [phone]   Fax:  [phone]  Name: Tina Howard MRN:  [id number] Date of Birth: 1933-04-29

## 2016-01-15 ENCOUNTER — Ambulatory Visit: Payer: Medicare Other

## 2016-01-15 DIAGNOSIS — M19011 Primary osteoarthritis, right shoulder: Secondary | ICD-10-CM

## 2016-01-15 DIAGNOSIS — M25512 Pain in left shoulder: Secondary | ICD-10-CM

## 2016-01-15 DIAGNOSIS — M19012 Primary osteoarthritis, left shoulder: Secondary | ICD-10-CM | POA: Diagnosis not present

## 2016-01-15 DIAGNOSIS — R531 Weakness: Secondary | ICD-10-CM

## 2016-01-15 DIAGNOSIS — M25511 Pain in right shoulder: Secondary | ICD-10-CM | POA: Diagnosis not present

## 2016-01-15 NOTE — Therapy (Signed)
[place] [facility] [address], [place], [address] Phone: [phone]   Fax:  [phone]  Physical Therapy Treatment And Discharge Summary  Patient Details  Name: Tina Howard MRN: [id number] Date of Birth: 1933/03/31 Referring Provider: Jackolyn Confer, MD  Encounter Date: 01/15/2016      PT End of Session - 01/15/16 1432    Visit Number 11   Number of Visits 17   Date for PT Re-Evaluation 01/15/16   Authorization Type 2   Authorization Time Period of 10   PT Start Time 1432   PT Stop Time 1525   PT Time Calculation (min) 53 min   Activity Tolerance Patient tolerated treatment well   Behavior During Therapy [facility] for tasks assessed/performed      Past Medical History  Diagnosis Date  . Allergic rhinitis   . Hypercholesteremia   . History of pelvic mass     s/p resection, benign  . Hypertension   . Varicella zoster 2007  . Osteoporosis     per pt report on Medical Screening Form     Past Surgical History  Procedure Laterality Date  . Total knee arthroplasty      left  . Vaginal hysterectomy      benign reasons  . Knee arthroscopy      right  . Cataract extraction Bilateral 2014    Dr. Sandra Cockayne  . Foot surgery      There were no vitals filed for this visit.  Visit Diagnosis:  Osteoarthritis of both shoulders, unspecified osteoarthritis type  Pain of both shoulder joints  Weakness      Subjective Assessment - 01/15/16 1434    Subjective 4/10 R shoulder pain currently when raising her R arm. L upper trap bothering her currently. Does not know whether she slept on it the wrong way. 8/10 L shoulder pain currently (at rest). Feels like it needs to be kneaded.    Pertinent History Bilateral shoulder pain began years ago with her L shoulder pain occurring first, followed by her R shoulder. Patient is R hand dominant. Difficulty reaching with her R arm to hang stuff in her closet.  Reached for an article of clothing 3 weeks ago from her closet and her R shoulder bothered her. Went to her MD who prescribed physical therapy and Meloxicam (daily) last Thursday.  Melixicam helps.  Pt desires not to have shoulder surgery.   Has not yet had physical therapy for her shoulder.    Patient Stated Goals Not to have surgery. Be able to use her R arm and not have pain or injure it more than it probably is.    Currently in Pain? Yes   Pain Score --  please see subjective   Multiple Pain Sites No            [facility] PT Assessment - 01/15/16 1519    AROM   Right Shoulder Flexion 130 Degrees   Right Shoulder ABduction 130 Degrees   Left Shoulder Flexion 106 Degrees  after exercises   Left Shoulder ABduction 97 Degrees  after exercieses   Strength   Right Shoulder External Rotation 4+/5  Measured on 01/08/16   Left Shoulder External Rotation 4+/5  measured on 01/08/16     Objectives Reproduction of L posterior shoulder/scapular symptoms with R cervical rotation   There-ex  Directed patient with standing L shoulder extension with scapular retraction resisting red band 10x2 with 5 second holds (decreased L  scapular discomfort to 3/10).  Pt was recommended to perform the aforementioned exercise for the L or R UE (whichever helps her feel better) at home if her discomfort returns. Pt verbalized understanding.   Supine L scapular protraction 10x2 with 10 second holds  S/L L shoulder ER resisting 2 lb weight 10x2  S/L L shoulder abduction 10x3  Standing R and L shoulder flexion, abduction 1x each way.   Reviewed progress/current status with shoulder AROM with pt.   Improved exercise technique, movement at target joints, use of target muscles after mod verbal, visual, tactile cues.      Manual therapy:   Soft tissue mobilization to L rhomboid minor, R rhomboid minor, R UT and R levator scapula muscles   Decreased bilateral shoulder pain    No L shoulder pain at rest  after session. Patient has demonstrated improved bilateral shoulder flexion and abduction AROM, ER strength, and decreased bilateral shoulder pain since initial evaluation. Pt also demonstrates independence with her HEP. Skilled physical therapy services discharged with patient continuing progress with her HEP.                           PT Education - 01/15/16 1536    Education provided Yes   Education Details ther-ex   [employer]) Educated Patient   Methods Explanation;Demonstration;Tactile cues;Verbal cues   Comprehension Verbalized understanding;Returned demonstration             PT Long Term Goals - 01/15/16 1543    PT LONG TERM GOAL #1   Title Patient will have a decrease in R shoulder pain to 5/10 or less at worst to promote ability to raise her arm, reach, don and doff clothes   Time 8   Period Weeks   Status On-going   PT LONG TERM GOAL #2   Title Patient will improve bilateral shoulder flexion and abduction by at least 30 degrees to promote ability to raise her arm, reach behind her head   Time 8   Period Weeks   Status Partially Met   PT LONG TERM GOAL #3   Title Patient will report decreased difficulty donning and doffing clothes as a demonstration of improved function.    Time 8   Period Weeks   Status Achieved   PT LONG TERM GOAL #4   Title Patient will improve bilateral shoulder ER strength by at least 1/2 MMT grade to promote ability to raise her arms.    Time 8   Period Weeks   Status Achieved               Plan - 01/15/16 1430    Clinical Impression Statement No L shoulder pain at rest after session. Patient has demonstrated improved bilateral shoulder flexion and abduction AROM, ER strength, and decreased bilateral shoulder pain since initial evaluation. Pt also demonstrates independence with her HEP. Skilled physical therapy services discharged with patient continuing progress with her HEP.    Pt will benefit from skilled  therapeutic intervention in order to improve on the following deficits --   Rehab Potential Fair   Clinical Impairments Affecting Rehab Potential age, pain, chronicity of condition   PT Frequency --   PT Duration --   PT Treatment/Interventions Therapeutic exercise;Manual techniques;Therapeutic activities;Iontophoresis 36m/ml Dexamethasone;Electrical Stimulation;Patient/family education;Neuromuscular re-education;Ultrasound   PT Next Visit Plan Continue progress with her HEP.   Consulted and Agree with Plan of Care Patient  G-Codes - 01/15/16 1546    Functional Assessment Tool Used Patient interview, clinical presentation   Functional Limitation Carrying, moving and handling objects   Carrying, Moving and Handling Objects Goal Status (C3762) At least 20 percent but less than 40 percent impaired, limited or restricted   Carrying, Moving and Handling Objects Discharge Status [phone]) At least 20 percent but less than 40 percent impaired, limited or restricted      Problem List Patient Active Problem List   Diagnosis Date Noted  . Right flank pain 12/31/2015  . Candidal skin infection 11/13/2015  . Obesity (BMI 30-39.9) 11/13/2014  . Back pain, chronic 05/08/2014  . Elevated blood sugar 05/08/2014  . Medicare annual wellness visit, subsequent 06/27/2012  . Hypertension 08/20/2011  . Hyperlipidemia 08/20/2011  . Osteoarthritis 08/20/2011    Thank you for your referral.  Joneen Boers PT, DPT   01/15/2016, 3:49 PM  [place] [facility] [address] [place], [address] Phone: [phone]   Fax:  [phone]  Name: NEIDY GUERRIERI MRN: [id number] Date of Birth: 15-Mar-1933

## 2016-01-26 ENCOUNTER — Telehealth: Payer: Self-pay | Admitting: Internal Medicine

## 2016-01-26 NOTE — Telephone Encounter (Signed)
Pt dropped off Handicap application renewal to be filled out by Dr. Gilford Rile. Paper is in Dr. Thomes Dinning box.

## 2016-01-28 ENCOUNTER — Encounter: Payer: Self-pay | Admitting: Podiatry

## 2016-01-28 ENCOUNTER — Ambulatory Visit (INDEPENDENT_AMBULATORY_CARE_PROVIDER_SITE_OTHER): Payer: Medicare Other | Admitting: Podiatry

## 2016-01-28 DIAGNOSIS — M79676 Pain in unspecified toe(s): Secondary | ICD-10-CM | POA: Diagnosis not present

## 2016-01-28 DIAGNOSIS — B351 Tinea unguium: Secondary | ICD-10-CM | POA: Diagnosis not present

## 2016-01-28 DIAGNOSIS — Q828 Other specified congenital malformations of skin: Secondary | ICD-10-CM

## 2016-01-28 NOTE — Progress Notes (Signed)
She presents today with a chief complaint of painful corns and calluses as well as painful elongated toenails bilateral.  Objective: Vital signs are stable she is alert and oriented 3. Pulses are strongly palpable. Multiple porokeratotic lesions plantar aspect of the bilateral foot with thick yellow dystrophic onychomycotic nails sharply incurvated.  Assessment: Pain in limb secondary to onychomycosis of all keratoses bilateral.  Plan: Debridement of toenails and calluses bilateral. Follow up with me in 2 months.

## 2016-01-28 NOTE — Telephone Encounter (Signed)
Completed and ready for pickup 

## 2016-03-29 ENCOUNTER — Ambulatory Visit: Payer: Medicare Other | Admitting: Podiatry

## 2016-03-31 ENCOUNTER — Encounter: Payer: Self-pay | Admitting: Podiatry

## 2016-03-31 ENCOUNTER — Ambulatory Visit (INDEPENDENT_AMBULATORY_CARE_PROVIDER_SITE_OTHER): Payer: Medicare Other | Admitting: Podiatry

## 2016-03-31 DIAGNOSIS — M79676 Pain in unspecified toe(s): Secondary | ICD-10-CM

## 2016-03-31 DIAGNOSIS — B351 Tinea unguium: Secondary | ICD-10-CM | POA: Diagnosis not present

## 2016-03-31 DIAGNOSIS — Q828 Other specified congenital malformations of skin: Secondary | ICD-10-CM | POA: Diagnosis not present

## 2016-03-31 NOTE — Progress Notes (Signed)
She presents today with a chief complaint of painful elongated toenails with corns and calluses.  Objective: Vital signs are stable alert and oriented 3. Pulses are strongly palpable bilateral. Toenails are thick yellow dystrophic onychomycotic painful palpation with reactive hyperkeratosis plantar aspect bilateral foot and porokeratosis.  Assessment: Pain limb secondary to onychomycosis and porokeratosis bilateral.  Plan: Debridement of all reactive hyperkeratotic tissue and debridement of toenails 1 through 5 bilateral.

## 2016-04-01 ENCOUNTER — Telehealth: Payer: Self-pay | Admitting: *Deleted

## 2016-04-01 ENCOUNTER — Ambulatory Visit (INDEPENDENT_AMBULATORY_CARE_PROVIDER_SITE_OTHER): Payer: Medicare Other | Admitting: Internal Medicine

## 2016-04-01 VITALS — BP 156/79 | HR 68 | Temp 97.8°F | Ht 61.0 in | Wt 195.2 lb

## 2016-04-01 DIAGNOSIS — R739 Hyperglycemia, unspecified: Secondary | ICD-10-CM

## 2016-04-01 DIAGNOSIS — E669 Obesity, unspecified: Secondary | ICD-10-CM

## 2016-04-01 DIAGNOSIS — I10 Essential (primary) hypertension: Secondary | ICD-10-CM | POA: Diagnosis not present

## 2016-04-01 DIAGNOSIS — R05 Cough: Secondary | ICD-10-CM

## 2016-04-01 DIAGNOSIS — R059 Cough, unspecified: Secondary | ICD-10-CM

## 2016-04-01 DIAGNOSIS — R7309 Other abnormal glucose: Secondary | ICD-10-CM | POA: Diagnosis not present

## 2016-04-01 DIAGNOSIS — E785 Hyperlipidemia, unspecified: Secondary | ICD-10-CM

## 2016-04-01 LAB — COMPREHENSIVE METABOLIC PANEL
ALBUMIN: 4.3 g/dL (ref 3.5–5.2)
ALT: 14 U/L (ref 0–35)
AST: 17 U/L (ref 0–37)
Alkaline Phosphatase: 58 U/L (ref 39–117)
BUN: 14 mg/dL (ref 6–23)
CALCIUM: 9.7 mg/dL (ref 8.4–10.5)
CO2: 30 mEq/L (ref 19–32)
CREATININE: 0.52 mg/dL (ref 0.40–1.20)
Chloride: 103 mEq/L (ref 96–112)
GFR: 120.33 mL/min (ref >60.00)
Glucose, Bld: 112 mg/dL — ABNORMAL HIGH (ref 70–99)
Potassium: 3.7 mEq/L (ref 3.5–5.1)
Sodium: 140 mEq/L (ref 135–145)
Total Bilirubin: 0.6 mg/dL (ref 0.2–1.2)
Total Protein: 7 g/dL (ref 6.0–8.3)

## 2016-04-01 LAB — LIPID PANEL
Cholesterol: 186 mg/dL (ref 0–200)
HDL: 62.2 mg/dL (ref >39.00)
LDL Cholesterol: 95 mg/dL (ref 0–99)
NonHDL: 124.09
TRIGLYCERIDES: 147 mg/dL (ref 0.0–149.0)
Total CHOL/HDL Ratio: 3
VLDL: 29.4 mg/dL (ref 0.0–40.0)

## 2016-04-01 LAB — HEMOGLOBIN A1C: HEMOGLOBIN A1C: 6 % (ref 4.6–6.5)

## 2016-04-01 LAB — MICROALBUMIN / CREATININE URINE RATIO
CREATININE, U: 100.8 mg/dL
MICROALB UR: 1.1 mg/dL (ref 0.0–1.9)
Microalb Creat Ratio: 1.1 mg/g (ref 0.0–30.0)

## 2016-04-01 MED ORDER — LOSARTAN POTASSIUM 50 MG PO TABS: 50.0000 mg | ORAL_TABLET | Freq: Every day | ORAL | Status: DC

## 2016-04-01 NOTE — Assessment & Plan Note (Signed)
Check LFTs and lipids with labs today. Continue Simvastatin.

## 2016-04-01 NOTE — Assessment & Plan Note (Signed)
Lab Results  Component Value Date   HGBA1C 5.8 11/13/2015   BG well controlled in past, repeat A1c with labs.

## 2016-04-01 NOTE — Progress Notes (Signed)
Pre visit review using our clinic review tool, if applicable. No additional management support is needed unless otherwise documented below in the visit note. 

## 2016-04-01 NOTE — Assessment & Plan Note (Signed)
BP Readings from Last 3 Encounters:  04/01/16 156/79  12/31/15 128/71  11/13/15 116/69   BP well controlled in past, slightly elevated today. Will change Lisinopril to Losartan given cough. She prefers to finish 2 weeks of Lisinopril then change. Repeat BP check and Cr K in 4 weeks.

## 2016-04-01 NOTE — Assessment & Plan Note (Signed)
Dry cough and PND likely related to seasonal allergies and possible cat allergy, but will try change in Lisinopril to Losartan to see if any improvement. Follow up in 4 weeks.

## 2016-04-01 NOTE — Telephone Encounter (Signed)
Pt dropped off urine

## 2016-04-01 NOTE — Telephone Encounter (Signed)
Just for urine microalbumin. She could not urinate while she was here.

## 2016-04-01 NOTE — Progress Notes (Signed)
Subjective:    Patient ID: Tina Howard, female    DOB: [birth year], 82 y.o.   MRN: [id number]  HPI  82YO female presents for follow up.  Some seasonal allergies with dry cough and PND. No fever, chills. No dyspnea. Questions if this is lisinopril. Wakes from sleep at 3am with cough. Has a cat in home. Not sure if allergic.   Wt Readings from Last 3 Encounters:  04/01/16 195 lb 4 oz (88.565 kg)  12/31/15 198 lb (89.812 kg)  11/13/15 195 lb 6 oz (88.622 kg)   BP Readings from Last 3 Encounters:  04/01/16 156/79  12/31/15 128/71  11/13/15 116/69    Past Medical History  Diagnosis Date  . Allergic rhinitis   . Hypercholesteremia   . History of pelvic mass     s/p resection, benign  . Hypertension   . Varicella zoster 2007  . Osteoporosis     per pt report on Medical Screening Form    Family History  Problem Relation Age of Onset  . Heart disease Father    Past Surgical History  Procedure Laterality Date  . Total knee arthroplasty      left  . Vaginal hysterectomy      benign reasons  . Knee arthroscopy      right  . Cataract extraction Bilateral 2014    Dr. Sandra Cockayne  . Foot surgery     Social History   Social History  . Marital Status: Single    Spouse Name: N/A  . Number of Children: N/A  . Years of Education: N/A   Social History Main Topics  . Smoking status: Never Smoker   . Smokeless tobacco: Never Used  . Alcohol Use: No  . Drug Use: No  . Sexual Activity: Not on file   Other Topics Concern  . Not on file   Social History Narrative    Review of Systems  Constitutional: Negative for fever, chills, appetite change, fatigue and unexpected weight change.  HENT: Positive for congestion, postnasal drip, rhinorrhea and sneezing. Negative for ear discharge, ear pain, facial swelling, hearing loss, mouth sores, nosebleeds, sinus pressure, sore throat, tinnitus, trouble swallowing and voice change.   Eyes: Negative for pain, discharge,  redness and visual disturbance.  Respiratory: Positive for cough. Negative for chest tightness, shortness of breath, wheezing and stridor.   Cardiovascular: Negative for chest pain, palpitations and leg swelling.  Gastrointestinal: Negative for vomiting, abdominal pain, diarrhea and constipation.  Musculoskeletal: Negative for myalgias, arthralgias, neck pain and neck stiffness.  Skin: Negative for color change and rash.  Neurological: Negative for dizziness, weakness, light-headedness and headaches.  Hematological: Negative for adenopathy. Does not bruise/bleed easily.  Psychiatric/Behavioral: Positive for sleep disturbance. Negative for dysphoric mood. The patient is not nervous/anxious.        Objective:    BP 156/79 mmHg  Pulse 68  Temp(Src) 97.8 F (36.6 C) (Oral)  Ht 5\' 1"  (1.549 m)  Wt 195 lb 4 oz (88.565 kg)  BMI 36.91 kg/m2  SpO2 96% Physical Exam  Constitutional: She is oriented to person, place, and time. She appears well-developed and well-nourished. No distress.  HENT:  Head: Normocephalic and atraumatic.  Right Ear: External ear normal.  Left Ear: External ear normal.  Nose: Nose normal.  Mouth/Throat: Oropharynx is clear and moist. No oropharyngeal exudate.  Eyes: Conjunctivae are normal. Pupils are equal, round, and reactive to light. Right eye exhibits no discharge. Left eye exhibits no discharge. No scleral icterus.  Neck: Normal range of motion. Neck supple. No tracheal deviation present. No thyromegaly present.  Cardiovascular: Normal rate, regular rhythm, normal heart sounds and intact distal pulses.  Exam reveals no gallop and no friction rub.   No murmur heard. Pulmonary/Chest: Effort normal and breath sounds normal. No accessory muscle usage. No tachypnea. No respiratory distress. She has no decreased breath sounds. She has no wheezes. She has no rhonchi. She has no rales. She exhibits no tenderness.  Musculoskeletal: Normal range of motion. She exhibits no  edema or tenderness.  Lymphadenopathy:    She has no cervical adenopathy.  Neurological: She is alert and oriented to person, place, and time. No cranial nerve deficit. She exhibits normal muscle tone. Coordination normal.  Skin: Skin is warm and dry. No rash noted. She is not diaphoretic. No erythema. No pallor.  Psychiatric: She has a normal mood and affect. Her behavior is normal. Judgment and thought content normal.          Assessment & Plan:   Problem List Items Addressed This Visit      Unprioritized   Cough    Dry cough and PND likely related to seasonal allergies and possible cat allergy, but will try change in Lisinopril to Losartan to see if any improvement. Follow up in 4 weeks.      Elevated blood sugar    Lab Results  Component Value Date   HGBA1C 5.8 11/13/2015   BG well controlled in past, repeat A1c with labs.      Relevant Orders   Hemoglobin A1c   Hyperlipidemia    Check LFTs and lipids with labs today. Continue Simvastatin.      Relevant Medications   losartan (COZAAR) 50 MG tablet   Other Relevant Orders   Lipid panel   Hypertension - Primary    BP Readings from Last 3 Encounters:  04/01/16 156/79  12/31/15 128/71  11/13/15 116/69   BP well controlled in past, slightly elevated today. Will change Lisinopril to Losartan given cough. She prefers to finish 2 weeks of Lisinopril then change. Repeat BP check and Cr K in 4 weeks.      Relevant Medications   losartan (COZAAR) 50 MG tablet   Other Relevant Orders   Comprehensive metabolic panel   Obesity (BMI 30-39.9)    Wt Readings from Last 3 Encounters:  04/01/16 195 lb 4 oz (88.565 kg)  12/31/15 198 lb (89.812 kg)  11/13/15 195 lb 6 oz (88.622 kg)   Encouraged healthy diet and exercise.          Return in about 4 weeks (around 04/29/2016) for Recheck.  Ronette Deter, MD Internal Medicine [facility]

## 2016-04-01 NOTE — Patient Instructions (Addendum)
Labs today.  Stop Lisionpril.  Start Losartan 50mg  daily.  Follow up in 4 weeks.

## 2016-04-01 NOTE — Assessment & Plan Note (Signed)
Wt Readings from Last 3 Encounters:  04/01/16 195 lb 4 oz (88.565 kg)  12/31/15 198 lb (89.812 kg)  11/13/15 195 lb 6 oz (88.622 kg)   Encouraged healthy diet and exercise.

## 2016-04-01 NOTE — Addendum Note (Signed)
Addended by: Karlene Einstein D on: 04/01/2016 02:24 PM   Modules accepted: Orders

## 2016-04-26 DIAGNOSIS — I509 Heart failure, unspecified: Secondary | ICD-10-CM

## 2016-04-26 DIAGNOSIS — I499 Cardiac arrhythmia, unspecified: Secondary | ICD-10-CM

## 2016-04-26 HISTORY — DX: Cardiac arrhythmia, unspecified: I49.9

## 2016-04-26 HISTORY — DX: Heart failure, unspecified: I50.9

## 2016-05-06 ENCOUNTER — Ambulatory Visit (INDEPENDENT_AMBULATORY_CARE_PROVIDER_SITE_OTHER): Payer: Medicare Other | Admitting: Internal Medicine

## 2016-05-06 ENCOUNTER — Encounter: Payer: Self-pay | Admitting: Internal Medicine

## 2016-05-06 VITALS — BP 132/78 | HR 80 | Temp 97.8°F | Ht 61.0 in | Wt 184.6 lb

## 2016-05-06 DIAGNOSIS — M549 Dorsalgia, unspecified: Secondary | ICD-10-CM

## 2016-05-06 DIAGNOSIS — G8929 Other chronic pain: Secondary | ICD-10-CM

## 2016-05-06 DIAGNOSIS — J302 Other seasonal allergic rhinitis: Secondary | ICD-10-CM

## 2016-05-06 DIAGNOSIS — I10 Essential (primary) hypertension: Secondary | ICD-10-CM

## 2016-05-06 DIAGNOSIS — R059 Cough, unspecified: Secondary | ICD-10-CM

## 2016-05-06 DIAGNOSIS — R05 Cough: Secondary | ICD-10-CM

## 2016-05-06 LAB — BASIC METABOLIC PANEL
BUN: 20 mg/dL (ref 6–23)
CALCIUM: 9 mg/dL (ref 8.4–10.5)
CO2: 26 mEq/L (ref 19–32)
CREATININE: 0.52 mg/dL (ref 0.40–1.20)
Chloride: 103 mEq/L (ref 96–112)
GFR: 120.3 mL/min (ref >60.00)
GLUCOSE: 101 mg/dL — AB (ref 70–99)
Potassium: 3.9 mEq/L (ref 3.5–5.1)
SODIUM: 139 meq/L (ref 135–145)

## 2016-05-06 NOTE — Assessment & Plan Note (Signed)
Cough has improved some with medication change. Likely seasonal allergies also contributing. Encouraged her to use prn Claritin and Flonase.

## 2016-05-06 NOTE — Progress Notes (Signed)
Subjective:    Patient ID: Tina Howard, female    DOB: 01-18-1933, 82 y.o.   MRN: [id number]  HPI  82YO female presents for followup  Recently seen for elevated BP and cough. Changed from Lisinopril to Losartan.  She notes some improvement in cough with change in medication. Some allergy symptoms, but not taking Claritin every day. Her son has moved in with her and brought his cat which may be making symptoms worse.  Taking 2 Ibuprofen at night to help with back pain.   Wt Readings from Last 3 Encounters:  05/06/16 184 lb 9.6 oz (83.734 kg)  04/01/16 195 lb 4 oz (88.565 kg)  12/31/15 198 lb (89.812 kg)   BP Readings from Last 3 Encounters:  05/06/16 132/78  04/01/16 156/79  12/31/15 128/71    Past Medical History  Diagnosis Date  . Allergic rhinitis   . Hypercholesteremia   . History of pelvic mass     s/p resection, benign  . Hypertension   . Varicella zoster 2007  . Osteoporosis     per pt report on Medical Screening Form    Family History  Problem Relation Age of Onset  . Heart disease Father    Past Surgical History  Procedure Laterality Date  . Total knee arthroplasty      left  . Vaginal hysterectomy      benign reasons  . Knee arthroscopy      right  . Cataract extraction Bilateral 2014    Dr. Sandra Cockayne  . Foot surgery     Social History   Social History  . Marital Status: Single    Spouse Name: N/A  . Number of Children: N/A  . Years of Education: N/A   Social History Main Topics  . Smoking status: Never Smoker   . Smokeless tobacco: Never Used  . Alcohol Use: No  . Drug Use: No  . Sexual Activity: Not Asked   Other Topics Concern  . None   Social History Narrative    Review of Systems  Constitutional: Negative for fever, chills, appetite change, fatigue and unexpected weight change.  HENT: Positive for congestion, postnasal drip, rhinorrhea and sneezing. Negative for sinus pressure, trouble swallowing and voice change.     Eyes: Negative for visual disturbance.  Respiratory: Negative for cough, shortness of breath and wheezing.   Cardiovascular: Negative for chest pain and leg swelling.  Gastrointestinal: Negative for nausea, vomiting, abdominal pain, diarrhea and constipation.  Musculoskeletal: Negative for myalgias and arthralgias.  Skin: Negative for color change and rash.  Hematological: Negative for adenopathy. Does not bruise/bleed easily.  Psychiatric/Behavioral: Negative for sleep disturbance and dysphoric mood. The patient is not nervous/anxious.        Objective:    BP 132/78 mmHg  Pulse 80  Temp(Src) 97.8 F (36.6 C) (Oral)  Ht 5\' 1"  (1.549 m)  Wt 184 lb 9.6 oz (83.734 kg)  BMI 34.90 kg/m2  SpO2 97% Physical Exam  Constitutional: She is oriented to person, place, and time. She appears well-developed and well-nourished. No distress.  HENT:  Head: Normocephalic and atraumatic.  Right Ear: External ear normal.  Left Ear: External ear normal.  Nose: Nose normal.  Mouth/Throat: Oropharynx is clear and moist. No oropharyngeal exudate.  Eyes: Conjunctivae are normal. Pupils are equal, round, and reactive to light. Right eye exhibits no discharge. Left eye exhibits no discharge. No scleral icterus.  Neck: Normal range of motion. Neck supple. No tracheal deviation present. No thyromegaly present.  Cardiovascular: Normal rate, regular rhythm, normal heart sounds and intact distal pulses.  Exam reveals no gallop and no friction rub.   No murmur heard. Pulmonary/Chest: Effort normal and breath sounds normal. No respiratory distress. She has no wheezes. She has no rales. She exhibits no tenderness.  Musculoskeletal: Normal range of motion. She exhibits no edema or tenderness.  Lymphadenopathy:    She has no cervical adenopathy.  Neurological: She is alert and oriented to person, place, and time. No cranial nerve deficit. She exhibits normal muscle tone. Coordination normal.  Skin: Skin is warm and  dry. No rash noted. She is not diaphoretic. No erythema. No pallor.  Psychiatric: She has a normal mood and affect. Her behavior is normal. Judgment and thought content normal.          Assessment & Plan:   Problem List Items Addressed This Visit      Unprioritized   Back pain, chronic    Encouraged her to continue use of Ibuprofen as needed.      Cough    Cough has improved some with medication change. Likely seasonal allergies also contributing. Encouraged her to use prn Claritin and Flonase.      Hypertension - Primary    BP Readings from Last 3 Encounters:  05/06/16 132/78  04/01/16 156/79  12/31/15 128/71   BP improved. Renal function with labs. Continue Losartan and Amlodipine.      Relevant Orders   Basic Metabolic Panel (BMET)   Seasonal allergies    Encouraged use of Claritin and Flonase. Follow up prn.          Return in about 3 months (around 08/06/2016) for Recheck.  Ronette Deter, MD Internal Medicine [facility]

## 2016-05-06 NOTE — Assessment & Plan Note (Signed)
Encouraged use of Claritin and Flonase. Follow up prn.

## 2016-05-06 NOTE — Progress Notes (Signed)
Pre visit review using our clinic review tool, if applicable. No additional management support is needed unless otherwise documented below in the visit note. 

## 2016-05-06 NOTE — Patient Instructions (Signed)
Labs today.   Follow up in 3 months.  

## 2016-05-06 NOTE — Assessment & Plan Note (Signed)
BP Readings from Last 3 Encounters:  05/06/16 132/78  04/01/16 156/79  12/31/15 128/71   BP improved. Renal function with labs. Continue Losartan and Amlodipine.

## 2016-05-06 NOTE — Assessment & Plan Note (Signed)
Encouraged her to continue use of Ibuprofen as needed.

## 2016-05-11 DIAGNOSIS — Z961 Presence of intraocular lens: Secondary | ICD-10-CM | POA: Diagnosis not present

## 2016-05-25 ENCOUNTER — Inpatient Hospital Stay
Admission: EM | Admit: 2016-05-25 | Discharge: 2016-05-27 | DRG: 293 | Disposition: A | Discharge status: Home / Self Care | Payer: Medicare Other | Attending: Internal Medicine | Admitting: Internal Medicine

## 2016-05-25 ENCOUNTER — Encounter: Payer: Self-pay | Admitting: Family

## 2016-05-25 ENCOUNTER — Ambulatory Visit (INDEPENDENT_AMBULATORY_CARE_PROVIDER_SITE_OTHER): Payer: Medicare Other | Admitting: Family

## 2016-05-25 ENCOUNTER — Emergency Department: Payer: Medicare Other

## 2016-05-25 ENCOUNTER — Encounter: Payer: Self-pay | Admitting: Emergency Medicine

## 2016-05-25 ENCOUNTER — Ambulatory Visit: Payer: Medicare Other | Admitting: Internal Medicine

## 2016-05-25 VITALS — BP 142/86 | HR 76 | Temp 98.9°F | Wt 201.4 lb

## 2016-05-25 VITALS — BP 128/78 | HR 82

## 2016-05-25 DIAGNOSIS — M810 Age-related osteoporosis without current pathological fracture: Secondary | ICD-10-CM | POA: Diagnosis present

## 2016-05-25 DIAGNOSIS — I4891 Unspecified atrial fibrillation: Secondary | ICD-10-CM

## 2016-05-25 DIAGNOSIS — Z0289 Encounter for other administrative examinations: Secondary | ICD-10-CM

## 2016-05-25 DIAGNOSIS — Z96652 Presence of left artificial knee joint: Secondary | ICD-10-CM | POA: Diagnosis not present

## 2016-05-25 DIAGNOSIS — I10 Essential (primary) hypertension: Secondary | ICD-10-CM

## 2016-05-25 DIAGNOSIS — R05 Cough: Secondary | ICD-10-CM | POA: Diagnosis not present

## 2016-05-25 DIAGNOSIS — I509 Heart failure, unspecified: Secondary | ICD-10-CM | POA: Diagnosis not present

## 2016-05-25 DIAGNOSIS — R079 Chest pain, unspecified: Secondary | ICD-10-CM

## 2016-05-25 DIAGNOSIS — I5033 Acute on chronic diastolic (congestive) heart failure: Secondary | ICD-10-CM | POA: Diagnosis not present

## 2016-05-25 DIAGNOSIS — Z9842 Cataract extraction status, left eye: Secondary | ICD-10-CM

## 2016-05-25 DIAGNOSIS — I110 Hypertensive heart disease with heart failure: Principal | ICD-10-CM | POA: Diagnosis present

## 2016-05-25 DIAGNOSIS — R0602 Shortness of breath: Secondary | ICD-10-CM | POA: Diagnosis not present

## 2016-05-25 DIAGNOSIS — R0789 Other chest pain: Secondary | ICD-10-CM

## 2016-05-25 DIAGNOSIS — Z888 Allergy status to other drugs, medicaments and biological substances status: Secondary | ICD-10-CM | POA: Diagnosis not present

## 2016-05-25 DIAGNOSIS — Z79899 Other long term (current) drug therapy: Secondary | ICD-10-CM

## 2016-05-25 DIAGNOSIS — E785 Hyperlipidemia, unspecified: Secondary | ICD-10-CM | POA: Diagnosis present

## 2016-05-25 DIAGNOSIS — Z6836 Body mass index (BMI) 36.0-36.9, adult: Secondary | ICD-10-CM

## 2016-05-25 DIAGNOSIS — Z7982 Long term (current) use of aspirin: Secondary | ICD-10-CM

## 2016-05-25 DIAGNOSIS — R059 Cough, unspecified: Secondary | ICD-10-CM

## 2016-05-25 DIAGNOSIS — Z9841 Cataract extraction status, right eye: Secondary | ICD-10-CM

## 2016-05-25 DIAGNOSIS — Z9889 Other specified postprocedural states: Secondary | ICD-10-CM

## 2016-05-25 DIAGNOSIS — E669 Obesity, unspecified: Secondary | ICD-10-CM | POA: Diagnosis present

## 2016-05-25 DIAGNOSIS — I480 Paroxysmal atrial fibrillation: Secondary | ICD-10-CM | POA: Diagnosis present

## 2016-05-25 DIAGNOSIS — Z90710 Acquired absence of both cervix and uterus: Secondary | ICD-10-CM

## 2016-05-25 DIAGNOSIS — Z8249 Family history of ischemic heart disease and other diseases of the circulatory system: Secondary | ICD-10-CM

## 2016-05-25 DIAGNOSIS — E876 Hypokalemia: Secondary | ICD-10-CM | POA: Diagnosis present

## 2016-05-25 LAB — BASIC METABOLIC PANEL
ANION GAP: 7 (ref 5–15)
BUN: 17 mg/dL (ref 6–20)
CALCIUM: 8.9 mg/dL (ref 8.9–10.3)
CO2: 29 mmol/L (ref 22–32)
Chloride: 104 mmol/L (ref 101–111)
Creatinine, Ser: 0.51 mg/dL (ref 0.44–1.00)
Glucose, Bld: 118 mg/dL — ABNORMAL HIGH (ref 65–99)
POTASSIUM: 3.5 mmol/L (ref 3.5–5.1)
Sodium: 140 mmol/L (ref 135–145)

## 2016-05-25 LAB — CBC
HEMATOCRIT: 37.3 % (ref 35.0–47.0)
Hemoglobin: 12.5 g/dL (ref 12.0–16.0)
MCH: 27.3 pg (ref 26.0–34.0)
MCHC: 33.4 g/dL (ref 32.0–36.0)
MCV: 81.7 fL (ref 80.0–100.0)
Platelets: 232 10*3/uL (ref 150–440)
RBC: 4.56 MIL/uL (ref 3.80–5.20)
RDW: 14.6 % — AB (ref 11.5–14.5)
WBC: 7.3 10*3/uL (ref 3.6–11.0)

## 2016-05-25 MED ORDER — ACETAMINOPHEN 500 MG PO TABS
1000.0000 mg | ORAL_TABLET | Freq: Once | ORAL | Status: AC
  Administered 2016-05-26: 1000 mg via ORAL

## 2016-05-25 MED ORDER — ASPIRIN 81 MG PO CHEW
324.0000 mg | CHEWABLE_TABLET | Freq: Once | ORAL | Status: AC
  Administered 2016-05-26: 324 mg via ORAL

## 2016-05-25 MED ORDER — NITROGLYCERIN 2 % TD OINT
1.0000 [in_us] | TOPICAL_OINTMENT | Freq: Once | TRANSDERMAL | Status: AC
  Administered 2016-05-26: 1 [in_us] via TOPICAL

## 2016-05-25 NOTE — Progress Notes (Signed)
Subjective:    Patient ID: Tina Howard, female    DOB: June 04, 1933, 82 y.o.   MRN: [id number]   Tina Howard is a 82 y.o. female who presents today for an acute visit.    HPI Comments: Patient here for evaluated for SOB for past 2 weeks, unchanged. She came to our clinic at lunchtime today and was triaged shortness of breath and this appointment was made for her to be seen by me is evening.Worse with activity. Example used when walking from car to [employer].  Endorses hoarse voice, slight cough past couple of days. He also describes left chest "twinge" and occasional palpitations. Denies exertional chest pain or pressure, numbness or tingling radiating to left arm or jaw, dizziness, frequent headaches, changes in vision. During SOB episodes, she isnt diaphoretic or having chest pain.    Started losartan 2 weeks ago due to [employer]. Endorses intermittent, chronic swelling around her ankles since she's been on amlodipine. Better in the morning, worse at the end of the day.    No h/o COPD, DVT, HF.   Follows with cardiology ; appt 07/2016. States stress test about 5 years ago.    She is a Primary school teacher.   Past Medical History  Diagnosis Date  . Allergic rhinitis   . Hypercholesteremia   . History of pelvic mass     s/p resection, benign  . Hypertension   . Varicella zoster 2007  . Osteoporosis     per pt report on Medical Screening Form    Allergies: Erythromycin Current Outpatient Prescriptions on File Prior to Visit  Medication Sig Dispense Refill  . amLODipine (NORVASC) 5 MG tablet Take 1 tablet (5 mg total) by mouth daily. 90 tablet 2  . aspirin 81 MG tablet Take 81 mg by mouth daily.      Mariane Baumgarten Calcium (STOOL SOFTENER PO) Take by mouth as directed     . losartan (COZAAR) 50 MG tablet Take 1 tablet (50 mg total) by mouth daily. 90 tablet 3  . simvastatin (ZOCOR) 20 MG tablet Take 1 tablet by mouth  daily 90 tablet 3  . timolol (TIMOPTIC) 0.5 % ophthalmic solution Place 1  drop into both eyes daily.      No current facility-administered medications on file prior to visit.    Social History  Substance Use Topics  . Smoking status: Never Smoker   . Smokeless tobacco: Never Used  . Alcohol Use: No    Review of Systems  Constitutional: Negative for fever and chills.  HENT: Negative for congestion and sinus pressure.   Respiratory: Positive for chest tightness, shortness of breath and wheezing.   Cardiovascular: Positive for leg swelling (chronic). Negative for chest pain and palpitations.  Gastrointestinal: Negative for nausea and vomiting.  Neurological: Negative for dizziness and syncope.      Objective:    BP 142/86 mmHg  Pulse 76  Temp(Src) 98.9 F (37.2 C) (Oral)  Wt 201 lb 6 oz (91.343 kg)  SpO2 98%   Physical Exam  Constitutional: She appears well-developed and well-nourished.  HENT:  Head: Normocephalic and atraumatic.  Right Ear: Hearing, tympanic membrane, external ear and ear canal normal. No drainage, swelling or tenderness. No foreign bodies. Tympanic membrane is not erythematous and not bulging. No middle ear effusion. No decreased hearing is noted.  Left Ear: Hearing, tympanic membrane, external ear and ear canal normal. No drainage, swelling or tenderness. No foreign bodies. Tympanic membrane is not erythematous and not bulging.  No  middle ear effusion. No decreased hearing is noted.  Nose: Nose normal. No rhinorrhea. Right sinus exhibits no maxillary sinus tenderness and no frontal sinus tenderness. Left sinus exhibits no maxillary sinus tenderness and no frontal sinus tenderness.  Mouth/Throat: Uvula is midline, oropharynx is clear and moist and mucous membranes are normal. No oropharyngeal exudate, posterior oropharyngeal edema, posterior oropharyngeal erythema or tonsillar abscesses.  Eyes: Conjunctivae are normal.  Cardiovascular: Normal rate, normal heart sounds and normal pulses.  An irregular rhythm present.  No murmur  heard. +1 LE edema.  No palpable cords or masses. No erythema or increased warmth. Negative Homan sign bilaterally.  LE hair growth symmetric and present. No discoloration of varicosities noted. LE warm and palpable pedal pulses.   Pulmonary/Chest: Effort normal and breath sounds normal. She has no wheezes. She has no rhonchi. She has no rales.  Lymphadenopathy:       Head (right side): No submental, no submandibular, no tonsillar, no preauricular, no posterior auricular and no occipital adenopathy present.       Head (left side): No submental, no submandibular, no tonsillar, no preauricular, no posterior auricular and no occipital adenopathy present.    She has no cervical adenopathy.  Neurological: She is alert.  Skin: Skin is warm and dry.  Psychiatric: She has a normal mood and affect. Her speech is normal and behavior is normal. Thought content normal.  Vitals reviewed.      Assessment & Plan:   1. Cough   2. Chest tightness  - EKG 12-Lead  3. New onset atrial fibrillation (HCC) New-onset atrial fibrillation. Rate appears to be controlled. Patient is on amlodipine. EKG shows nonspecific ST depression and I have concern for MI. Patient also symptomatic with shortness of breath, chest 'twinge' and I would like for her to have high level of care. I have advised her to go to the emergency room.   I advised her to take an ambulance to the hospital however patient politely declined and wanted to ride in a personal vehicle. She waited in our lobby until her son came to pick her up to drive her to [facility].   I am having Ms. Loughran maintain her timolol, aspirin, Docusate Calcium (STOOL SOFTENER PO), amLODipine, simvastatin, and losartan.   No orders of the defined types were placed in this encounter.     Start medications as prescribed and explained to patient on After Visit Summary ( AVS). Risks, benefits, and alternatives of the medications and treatment plan  prescribed today were discussed, and patient expressed understanding.   Education regarding symptom management and diagnosis given to patient.   Follow-up:Plan follow-up and return precautions given if any worsening symptoms or change in condition.   Continue to follow with Rica Mast, MD for routine health maintenance.   Illene Labrador and I agreed with plan.   Mable Paris, FNP

## 2016-05-25 NOTE — ED Provider Notes (Signed)
[facility] Emergency Department Provider Note   ____________________________________________  Time seen: Approximately 11:24 PM  I have reviewed the triage vital signs and the nursing notes.   HISTORY  Chief Complaint Shortness of Breath    HPI Tina Howard is a 82 y.o. female sent to the ED from PCP's office for shortness of breath, chest tightness and new-onset atrial fibrillation. Patient states PCP changed her from lisinopril to losartan 1 month ago. Reports a 2 week history of progressive shortness of breath worsened with exertion and laying supine. EKG from PCPs office today reveals new onset atrial fibrillation. Patient complains of chest tightness associated with shortness of breath. Denies recent fever, chills, cough, abdominal pain, nausea, vomiting, diarrhea. Denies recent travel or trauma.   Past Medical History  Diagnosis Date  . Allergic rhinitis   . Hypercholesteremia   . History of pelvic mass     s/p resection, benign  . Hypertension   . Varicella zoster 2007  . Osteoporosis     per pt report on Medical Screening Form     Patient Active Problem List   Diagnosis Date Noted  . Seasonal allergies 05/06/2016  . Cough 04/01/2016  . Obesity (BMI 30-39.9) 11/13/2014  . Back pain, chronic 05/08/2014  . Elevated blood sugar 05/08/2014  . Medicare annual wellness visit, subsequent 06/27/2012  . Hypertension 08/20/2011  . Hyperlipidemia 08/20/2011  . Osteoarthritis 08/20/2011    Past Surgical History  Procedure Laterality Date  . Total knee arthroplasty      left  . Vaginal hysterectomy      benign reasons  . Knee arthroscopy      right  . Cataract extraction Bilateral 2014    Dr. Sandra Cockayne  . Foot surgery      Current Outpatient Rx  Name  Route  Sig  Dispense  Refill  . amLODipine (NORVASC) 5 MG tablet   Oral   Take 1 tablet (5 mg total) by mouth daily.   90 tablet   2   . aspirin 81 MG tablet   Oral   Take 81  mg by mouth daily.           Mariane Baumgarten Calcium (STOOL SOFTENER PO)   Oral   Take 1 capsule by mouth 2 (two) times daily as needed. Take by mouth as directed         . losartan (COZAAR) 50 MG tablet   Oral   Take 1 tablet (50 mg total) by mouth daily.   90 tablet   3   . simvastatin (ZOCOR) 20 MG tablet      Take 1 tablet by mouth  daily   90 tablet   3   . timolol (TIMOPTIC) 0.5 % ophthalmic solution   Both Eyes   Place 1 drop into both eyes daily.            Allergies Erythromycin  Family History  Problem Relation Age of Onset  . Heart disease Father     Social History Social History  Substance Use Topics  . Smoking status: Never Smoker   . Smokeless tobacco: Never Used  . Alcohol Use: No    Review of Systems  Constitutional: No fever/chills. Eyes: No visual changes. ENT: No sore throat. Cardiovascular: Positive for chest pain. Respiratory: Positive for shortness of breath. Gastrointestinal: No abdominal pain.  No nausea, no vomiting.  No diarrhea.  No constipation. Genitourinary: Negative for dysuria. Musculoskeletal: Negative for back pain. Skin: Negative for  rash. Neurological: Negative for headaches, focal weakness or numbness.  10-point ROS otherwise negative.  ____________________________________________   PHYSICAL EXAM:  VITAL SIGNS: ED Triage Vitals  Enc Vitals Group     BP 05/25/16 1856 170/76 mmHg     Pulse Rate 05/25/16 1856 82     Resp 05/25/16 1856 18     Temp 05/25/16 1856 98.4 F (36.9 C)     Temp Source 05/25/16 1856 Oral     SpO2 05/25/16 1856 100 %     Weight 05/25/16 1856 190 lb (86.183 kg)     Height 05/25/16 1856 5\' 3"  (1.6 m)     Head Cir --      Peak Flow --      Pain Score --      Pain Loc --      Pain Edu? --      Excl. in [place]? --     Constitutional: Alert and oriented. Well appearing and in no acute distress. Eyes: Conjunctivae are normal. PERRL. EOMI. Head: Atraumatic. Nose: No  congestion/rhinnorhea. Mouth/Throat: Mucous membranes are moist.  Oropharynx non-erythematous. Neck: No stridor.   Cardiovascular: Normal rate, irregular rhythm. Grossly normal heart sounds.  Good peripheral circulation. Respiratory: Normal respiratory effort.  No retractions. Lungs with faint rales bibasilarly. Gastrointestinal: Soft and nontender. No distention. No abdominal bruits. No CVA tenderness. Musculoskeletal: No lower extremity tenderness. 1+ nonpitting BLE edema.  No joint effusions. Neurologic:  Normal speech and language. No gross focal neurologic deficits are appreciated.  Skin:  Skin is warm, dry and intact. No rash noted. Psychiatric: Mood and affect are normal. Speech and behavior are normal.  ____________________________________________   LABS (all labs ordered are listed, but only abnormal results are displayed)  Labs Reviewed  CBC - Abnormal; Notable for the following:    RDW 14.6 (*)    All other components within normal limits  BASIC METABOLIC PANEL - Abnormal; Notable for the following:    Glucose, Bld 118 (*)    All other components within normal limits  BRAIN NATRIURETIC PEPTIDE - Abnormal; Notable for the following:    B Natriuretic Peptide 190.0 (*)    All other components within normal limits  TROPONIN I   ____________________________________________  EKG  ED ECG REPORT I, Cory Rama J, the attending physician, personally viewed and interpreted this ECG.   Date: 05/25/2016  EKG Time: 1853  Rate: 73  Rhythm: atrial fibrillation, rate 73  Axis: Normal  Intervals:none  ST&T Change: Nonspecific  ____________________________________________  RADIOLOGY  Chest 2 view (viewed by me, interpreted per Dr. Jasmine December): No edema or consolidation. no edem or consolidation. ____________________________________________   PROCEDURES  Procedure(s) performed: None  Critical Care performed: No  ____________________________________________   INITIAL  IMPRESSION / ASSESSMENT AND PLAN / ED COURSE  Pertinent labs & imaging results that were available during my care of the patient were reviewed by me and considered in my medical decision making (see chart for details).  82 year old female sent to the ED by her PCP for dyspnea and new onset atrial fibrillation. Will initiate aspirin therapy and nitroglycerin paste for chest tightness and hypertension; will check troponin and chest x-ray. Anticipate hospital admission for new onset atrial fibrillation.  ----------------------------------------- 1:01 AM on 05/26/2016 -----------------------------------------  Updated patient of laboratory and imaging results. Discussed with hospitalist evaluate patient in the emergency department for admission.  ----------------------------------------- 1:34 AM on 05/26/2016 -----------------------------------------  CHF vest reading = 41% ____________________________________________   FINAL CLINICAL IMPRESSION(S) / ED DIAGNOSES  Final diagnoses:  Congestive heart failure, unspecified congestive heart failure chronicity, unspecified congestive heart failure type ([place])  New onset atrial fibrillation (H[facility])      NEW MEDICATIONS STARTED DURING THIS VISIT:  New Prescriptions   No medications on file     Note:  This document was prepared using Dragon voice recognition software and may include unintentional dictation errors.    Paulette Blanch, MD 05/26/16 [phone]

## 2016-05-25 NOTE — Progress Notes (Signed)
Seen by nursing staff during lunchtime. Plan to RTC for visit with physician later this afternoon.

## 2016-05-25 NOTE — ED Notes (Signed)
Pt here with c/o shob for the past few days, states she wakes up at night feeling like she is struggling to breathe. Pt very talkative in triage, appears in no distress. 99% on RA.

## 2016-05-25 NOTE — Progress Notes (Signed)
Patient walked in office today complaining of trouble breathing. O2 Stats taken, after triaging patient didn't seem to be in any distress.  Patient Stated that this issue has been going on for about two weeks. Advised patient to try a nebulizer treatment at home and follow up 1700 with provider [id number].

## 2016-05-25 NOTE — Progress Notes (Signed)
Pre visit review using our clinic review tool, if applicable. No additional management support is needed unless otherwise documented below in the visit note. 

## 2016-05-26 ENCOUNTER — Inpatient Hospital Stay
Admit: 2016-05-26 | Discharge: 2016-05-26 | Disposition: A | Discharge status: Home / Self Care | Payer: Medicare Other | Attending: Family Medicine | Admitting: Family Medicine

## 2016-05-26 DIAGNOSIS — I10 Essential (primary) hypertension: Secondary | ICD-10-CM

## 2016-05-26 DIAGNOSIS — Z888 Allergy status to other drugs, medicaments and biological substances status: Secondary | ICD-10-CM | POA: Diagnosis not present

## 2016-05-26 DIAGNOSIS — E785 Hyperlipidemia, unspecified: Secondary | ICD-10-CM | POA: Diagnosis present

## 2016-05-26 DIAGNOSIS — Z9889 Other specified postprocedural states: Secondary | ICD-10-CM | POA: Diagnosis not present

## 2016-05-26 DIAGNOSIS — Z7982 Long term (current) use of aspirin: Secondary | ICD-10-CM | POA: Diagnosis not present

## 2016-05-26 DIAGNOSIS — E876 Hypokalemia: Secondary | ICD-10-CM | POA: Diagnosis present

## 2016-05-26 DIAGNOSIS — Z79899 Other long term (current) drug therapy: Secondary | ICD-10-CM | POA: Diagnosis not present

## 2016-05-26 DIAGNOSIS — E669 Obesity, unspecified: Secondary | ICD-10-CM | POA: Diagnosis present

## 2016-05-26 DIAGNOSIS — Z9841 Cataract extraction status, right eye: Secondary | ICD-10-CM | POA: Diagnosis not present

## 2016-05-26 DIAGNOSIS — I480 Paroxysmal atrial fibrillation: Secondary | ICD-10-CM | POA: Diagnosis present

## 2016-05-26 DIAGNOSIS — I208 Other forms of angina pectoris: Secondary | ICD-10-CM | POA: Diagnosis not present

## 2016-05-26 DIAGNOSIS — Z8249 Family history of ischemic heart disease and other diseases of the circulatory system: Secondary | ICD-10-CM | POA: Diagnosis not present

## 2016-05-26 DIAGNOSIS — I4891 Unspecified atrial fibrillation: Secondary | ICD-10-CM | POA: Diagnosis present

## 2016-05-26 DIAGNOSIS — Z6836 Body mass index (BMI) 36.0-36.9, adult: Secondary | ICD-10-CM | POA: Diagnosis not present

## 2016-05-26 DIAGNOSIS — M810 Age-related osteoporosis without current pathological fracture: Secondary | ICD-10-CM | POA: Diagnosis present

## 2016-05-26 DIAGNOSIS — I110 Hypertensive heart disease with heart failure: Secondary | ICD-10-CM | POA: Diagnosis present

## 2016-05-26 DIAGNOSIS — Z96652 Presence of left artificial knee joint: Secondary | ICD-10-CM | POA: Diagnosis present

## 2016-05-26 DIAGNOSIS — Z90710 Acquired absence of both cervix and uterus: Secondary | ICD-10-CM | POA: Diagnosis not present

## 2016-05-26 DIAGNOSIS — I5033 Acute on chronic diastolic (congestive) heart failure: Secondary | ICD-10-CM | POA: Diagnosis present

## 2016-05-26 DIAGNOSIS — Z9842 Cataract extraction status, left eye: Secondary | ICD-10-CM | POA: Diagnosis not present

## 2016-05-26 DIAGNOSIS — R0789 Other chest pain: Secondary | ICD-10-CM | POA: Diagnosis present

## 2016-05-26 LAB — BASIC METABOLIC PANEL
ANION GAP: 7 (ref 5–15)
BUN: 12 mg/dL (ref 6–20)
CHLORIDE: 104 mmol/L (ref 101–111)
CO2: 30 mmol/L (ref 22–32)
CREATININE: 0.35 mg/dL — AB (ref 0.44–1.00)
Calcium: 8.7 mg/dL — ABNORMAL LOW (ref 8.9–10.3)
GFR calc non Af Amer: >60 mL/min (ref >60)
Glucose, Bld: 118 mg/dL — ABNORMAL HIGH (ref 65–99)
POTASSIUM: 2.9 mmol/L — AB (ref 3.5–5.1)
SODIUM: 141 mmol/L (ref 135–145)

## 2016-05-26 LAB — CBC
HCT: 35.4 % (ref 35.0–47.0)
Hemoglobin: 12.2 g/dL (ref 12.0–16.0)
MCH: 27.4 pg (ref 26.0–34.0)
MCHC: 34.4 g/dL (ref 32.0–36.0)
MCV: 79.8 fL — AB (ref 80.0–100.0)
PLATELETS: 222 10*3/uL (ref 150–440)
RBC: 4.44 MIL/uL (ref 3.80–5.20)
RDW: 14.6 % — ABNORMAL HIGH (ref 11.5–14.5)
WBC: 7.1 10*3/uL (ref 3.6–11.0)

## 2016-05-26 LAB — BRAIN NATRIURETIC PEPTIDE: B NATRIURETIC PEPTIDE 5: 190 pg/mL — AB (ref 0.0–100.0)

## 2016-05-26 LAB — TROPONIN I
Troponin I: <0.03 ng/mL (ref <0.031)
Troponin I: <0.03 ng/mL (ref <0.031)

## 2016-05-26 LAB — MAGNESIUM: Magnesium: 2.2 mg/dL (ref 1.7–2.4)

## 2016-05-26 LAB — TSH: TSH: 2.713 u[IU]/mL (ref 0.350–4.500)

## 2016-05-26 LAB — POTASSIUM: Potassium: 3.4 mmol/L — ABNORMAL LOW (ref 3.5–5.1)

## 2016-05-26 MED ORDER — SENNOSIDES-DOCUSATE SODIUM 8.6-50 MG PO TABS: 1.0000 | ORAL_TABLET | Freq: Every evening | ORAL | Status: DC | PRN

## 2016-05-26 MED ORDER — POTASSIUM CHLORIDE CRYS ER 20 MEQ PO TBCR
40.0000 meq | EXTENDED_RELEASE_TABLET | Freq: Once | ORAL | Status: AC
  Administered 2016-05-26: 40 meq via ORAL

## 2016-05-26 MED ORDER — RIVAROXABAN 20 MG PO TABS: 20.0000 mg | ORAL_TABLET | Freq: Every day | ORAL | Status: DC

## 2016-05-26 MED ORDER — SODIUM CHLORIDE 0.9% FLUSH
3.0000 mL | Freq: Two times a day (BID) | INTRAVENOUS | Status: DC
  Administered 2016-05-26 – 2016-05-27 (×3): 3 mL via INTRAVENOUS

## 2016-05-26 MED ORDER — FUROSEMIDE 10 MG/ML IJ SOLN
20.0000 mg | Freq: Once | INTRAMUSCULAR | Status: AC
  Administered 2016-05-26: 20 mg via INTRAVENOUS

## 2016-05-26 MED ORDER — METOPROLOL TARTRATE 25 MG PO TABS
25.0000 mg | ORAL_TABLET | Freq: Two times a day (BID) | ORAL | Status: DC
  Administered 2016-05-26 – 2016-05-27 (×3): 25 mg via ORAL
  Filled 2016-05-26 (×2): qty 1

## 2016-05-26 MED ORDER — SIMVASTATIN 20 MG PO TABS
20.0000 mg | ORAL_TABLET | Freq: Every day | ORAL | Status: DC
  Administered 2016-05-26: 20 mg via ORAL
  Filled 2016-05-26: qty 1

## 2016-05-26 MED ORDER — FUROSEMIDE 10 MG/ML IJ SOLN
20.0000 mg | Freq: Every day | INTRAMUSCULAR | Status: DC
  Administered 2016-05-26 – 2016-05-27 (×2): 20 mg via INTRAVENOUS
  Filled 2016-05-26: qty 2

## 2016-05-26 MED ORDER — ACETAMINOPHEN 650 MG RE SUPP: 650.0000 mg | Freq: Four times a day (QID) | RECTAL | Status: DC | PRN

## 2016-05-26 MED ORDER — ONDANSETRON HCL 4 MG PO TABS: 4.0000 mg | ORAL_TABLET | Freq: Four times a day (QID) | ORAL | Status: DC | PRN

## 2016-05-26 MED ORDER — LOSARTAN POTASSIUM 50 MG PO TABS
50.0000 mg | ORAL_TABLET | Freq: Every day | ORAL | Status: DC
  Administered 2016-05-26 – 2016-05-27 (×2): 50 mg via ORAL
  Filled 2016-05-26: qty 1

## 2016-05-26 MED ORDER — POTASSIUM CHLORIDE CRYS ER 20 MEQ PO TBCR
20.0000 meq | EXTENDED_RELEASE_TABLET | Freq: Once | ORAL | Status: AC
  Administered 2016-05-26: 20 meq via ORAL
  Filled 2016-05-26: qty 1

## 2016-05-26 MED ORDER — TIMOLOL MALEATE 0.5 % OP SOLN
1.0000 [drp] | Freq: Every day | OPHTHALMIC | Status: DC
  Administered 2016-05-26: 1 [drp] via OPHTHALMIC
  Filled 2016-05-26: qty 5

## 2016-05-26 MED ORDER — ONDANSETRON HCL 4 MG/2ML IJ SOLN: 4.0000 mg | Freq: Four times a day (QID) | INTRAMUSCULAR | Status: DC | PRN

## 2016-05-26 MED ORDER — RIVAROXABAN 15 MG PO TABS
15.0000 mg | ORAL_TABLET | Freq: Two times a day (BID) | ORAL | Status: DC
  Administered 2016-05-26: 15 mg via ORAL

## 2016-05-26 MED ORDER — ACETAMINOPHEN 325 MG PO TABS: 650.0000 mg | ORAL_TABLET | Freq: Four times a day (QID) | ORAL | Status: DC | PRN

## 2016-05-26 NOTE — ED Notes (Addendum)
Nitro removed from chest.

## 2016-05-26 NOTE — Care Management (Addendum)
Provided patient with a Xarelto coupon

## 2016-05-26 NOTE — Progress Notes (Signed)
MEDICATION RELATED CONSULT NOTE - INITIAL   Pharmacy Consult for Electrolytes Indication: hypokalemia  Allergies  Allergen Reactions  . Erythromycin Nausea And Vomiting    Patient Measurements: Height: 5\' 1"  (154.9 cm) Weight: 194 lb 11.2 oz (88.315 kg) IBW/kg (Calculated) : 47.8   Vital Signs: Temp: 98.5 F (36.9 C) (05/31 1309) Temp Source: Oral (05/31 1309) BP: 149/84 mmHg (05/31 1309) Pulse Rate: 74 (05/31 1309) Intake/Output from previous day:   Intake/Output from this shift: Total I/O In: 360 [P.O.:360] Out: 1200 [Urine:1200]  Labs:  Recent Labs  05/25/16 1859 05/26/16 0432  WBC 7.3 7.1  HGB 12.5 12.2  HCT 37.3 35.4  PLT 232 222  CREATININE 0.51 0.35*   Estimated Creatinine Clearance: 55.7 mL/min (by C-G formula based on Cr of 0.35).   Microbiology: No results found for this or any previous visit (from the past 720 hour(s)).  Medical History: Past Medical History  Diagnosis Date  . Allergic rhinitis   . Hypercholesteremia   . History of pelvic mass     s/p resection, benign  . Hypertension   . Varicella zoster 2007  . Osteoporosis     per pt report on Medical Screening Form     Medications:  Scheduled:  . furosemide  20 mg Intravenous Daily  . losartan  50 mg Oral Daily  . metoprolol tartrate  25 mg Oral BID  . [START ON 05/27/2016] rivaroxaban  20 mg Oral Q supper  . simvastatin  20 mg Oral q1800  . sodium chloride flush  3 mL Intravenous Q12H  . timolol  1 drop Both Eyes Daily    Assessment: Pharmacy consulted to supplement electrolytes in an 82 yo female admitted for atrial fibrillation. MD ordered KCl 40 meq po once.   K: 2.9, magnesium pending  Goal of Therapy:  K: 3.5-5.1 Mag: 1.7-2.4  Plan:  Will recheck potassium at 1600 today to assess need for further supplementation.  Will add magnesium on to today's labs.   Pharmacy will continue to follow.   Dontarious Schaum G 05/26/2016,3:34 PM

## 2016-05-26 NOTE — Care Management Obs Status (Signed)
MEDICARE OBSERVATION STATUS NOTIFICATION   Patient Details  Name: Tina Howard MRN: [id number] Date of Birth: [birth year]   Medicare Observation Status Notification Given:  Yes    Jolly Mango, RN 05/26/2016, 10:27 AM

## 2016-05-26 NOTE — ED Notes (Signed)
transporting patient to room 243-2A

## 2016-05-26 NOTE — Progress Notes (Signed)
*  PRELIMINARY RESULTS* Echocardiogram 2D Echocardiogram has been performed.  Tina Howard 05/26/2016, 3:31 PM

## 2016-05-26 NOTE — Progress Notes (Signed)
[place] [facility] [place] NAME: Tina Howard    MR#:  [id number]  DATE OF BIRTH:  Apr 28, 1933  SUBJECTIVE:admitted for new onset afib   And SOB> Receiving IV lasix and on the metoprolol. She feels much better today. No chest pain.  leg edema also decreased.   CHIEF COMPLAINT:   Chief Complaint  Patient presents with  . Shortness of Breath    REVIEW OF SYSTEMS:   ROS CONSTITUTIONAL: No fever, fatigue or weakness.  EYES: No blurred or double vision.  EARS, NOSE, AND THROAT: No tinnitus or ear pain.  RESPIRATORY: No cough, shortness of breath, wheezing or hemoptysis.  CARDIOVASCULAR: No chest pain, orthopnea, edema.  GASTROINTESTINAL: No nausea, vomiting, diarrhea or abdominal pain.  GENITOURINARY: No dysuria, hematuria.  ENDOCRINE: No polyuria, nocturia,  HEMATOLOGY: No anemia, easy bruising or bleeding SKIN: No rash or lesion. MUSCULOSKELETAL: No joint pain or arthritis.  Decreased leg edema. NEUROLOGIC: No tingling, numbness, weakness.  PSYCHIATRY: No anxiety or depression.   DRUG ALLERGIES:   Allergies  Allergen Reactions  . Erythromycin Nausea And Vomiting    VITALS:  Blood pressure 154/73, pulse 82, temperature 97.7 F (36.5 C), temperature source Oral, resp. rate 20, height 5\' 1"  (1.549 m), weight 88.315 kg (194 lb 11.2 oz), SpO2 98 %.  PHYSICAL EXAMINATION:  GENERAL:  82 y.o.-year-old patient lying in the bed with no acute distress.  EYES: Pupils equal, round, reactive to light and accommodation. No scleral icterus. Extraocular muscles intact.  HEENT: Head atraumatic, normocephalic. Oropharynx and nasopharynx clear.  NECK:  Supple, no jugular venous distention. No thyroid enlargement, no tenderness.  LUNGS: Normal breath sounds bilaterally, no wheezing, rales,rhonchi or crepitation. No use of accessory muscles of respiration.  CARDIOVASCULAR: S1, S2 normal. No murmurs, rubs, or gallops.  ABDOMEN: Soft, nontender,  nondistended. Bowel sounds present. No organomegaly or mass.  EXTREMITIES: bilateral pitting edema  NEUROLOGIC: Cranial nerves II through XII are intact. Muscle strength 5/5 in all extremities. Sensation intact. Gait not checked.   PSYCHIATRIC: The patient is alert and oriented x 3.  SKIN: No obvious rash, lesion, or ulcer.    LABORATORY PANEL:   CBC  Recent Labs Lab 05/26/16 0432  WBC 7.1  HGB 12.2  HCT 35.4  PLT 222   ------------------------------------------------------------------------------------------------------------------  Chemistries   Recent Labs Lab 05/26/16 0432  NA 141  K 2.9*  CL 104  CO2 30  GLUCOSE 118*  BUN 12  CREATININE 0.35*  CALCIUM 8.7*   ------------------------------------------------------------------------------------------------------------------  Cardiac Enzymes  Recent Labs Lab 05/26/16 0432  TROPONINI <0.03   ------------------------------------------------------------------------------------------------------------------  RADIOLOGY:  Dg Chest 2 View  05/25/2016  CLINICAL DATA:  Shortness of Breath EXAM: CHEST  2 VIEW COMPARISON:  None. FINDINGS: There is no edema or consolidation. The heart size and pulmonary vascularity are normal. No adenopathy. There is degenerative change in the thoracic spine. IMPRESSION: No edema or consolidation. Electronically Signed   By: Lowella Grip III M.D.   On: 05/25/2016 19:41    EKG:   Orders placed or performed during the hospital encounter of 05/25/16  . EKG 12-Lead  . EKG 12-Lead  . ED EKG  . ED EKG    ASSESSMENT AND PLAN:   #1. New onset atrial fibrillation ' Rate is controlled. Continue Xarelto, metoprolol/ discussed risks andbenefits of anticoagulation. Cardiology consult is requested because of atrial fibrillation and CHF. Follow echocardiogram. Monitor on telemetry.  Discussed this with patient and patient is agreeable for that. #  2. Hypokalemia secondary to diuretics:  Replace and recheck the potassium. Check the magnesium levels also.  All the records are reviewed and case discussed with Care Management/Social Workerr. Management plans discussed with the patient, family and they are in agreement.  CODE STATUS:full  TOTAL TIME TAKING CARE OF THIS PATIENT: 35 minutes.   POSSIBLE D/C IN 1-2DAYS, DEPENDING ON CLINICAL CONDITION.   Epifanio Lesches M.D on 05/26/2016 at 10:06 AM  Between 7am to 6pm - Pager - [phone]  After 6pm go to [email or web address] - password [email or web address] [facility]  Office  [phone]  CC: Primary care physician; Rica Mast, MD   Note: This dictation was prepared with Dragon dictation along with smaller phrase technology. Any transcriptional errors that result from this process are unintentional.

## 2016-05-26 NOTE — H&P (Signed)
PCP:   Rica Mast, MD   Chief Complaint:  Atrial fibrillation  HPI: This is a 82 year old female who has been having shortness of breath for approximately 2 weeks. It is getting worsening shortness of PCP today. An EKG was done and she was noted to have atrial fibrillation. She was sent directly to the ER. The patient has also been having some chest pressure wrapped around her torso. Her blood pressure was also elevated in the ER 187/109.   Review of Systems:  The patient denies anorexia, fever, weight loss,, vision loss, decreased hearing, hoarseness, chest pain, syncope, dyspnea on exertion, peripheral edema, balance deficits, hemoptysis, abdominal pain, melena, hematochezia, severe indigestion/heartburn, hematuria, incontinence, genital sores, muscle weakness, suspicious skin lesions, transient blindness, difficulty walking, depression, unusual weight change, abnormal bleeding, enlarged lymph nodes, angioedema, and breast masses.  Past Medical History: Past Medical History  Diagnosis Date  . Allergic rhinitis   . Hypercholesteremia   . History of pelvic mass     s/p resection, benign  . Hypertension   . Varicella zoster 2007  . Osteoporosis     per pt report on Medical Screening Form    Past Surgical History  Procedure Laterality Date  . Total knee arthroplasty      left  . Vaginal hysterectomy      benign reasons  . Knee arthroscopy      right  . Cataract extraction Bilateral 2014    Dr. Sandra Cockayne  . Foot surgery      Medications: Prior to Admission medications   Medication Sig Start Date End Date Taking? Authorizing Provider  aspirin 81 MG tablet Take 81 mg by mouth daily.     Yes Historical Provider, MD  Docusate Calcium (STOOL SOFTENER PO) Take 1 capsule by mouth 2 (two) times daily as needed. Take by mouth as directed   Yes Historical Provider, MD  losartan (COZAAR) 50 MG tablet Take 1 tablet (50 mg total) by mouth daily. 04/01/16  Yes Jackolyn Confer,  MD  simvastatin (ZOCOR) 20 MG tablet Take 1 tablet by mouth  daily 01/05/16  Yes Jackolyn Confer, MD  timolol (TIMOPTIC) 0.5 % ophthalmic solution Place 1 drop into both eyes daily.    Yes Historical Provider, MD    Allergies:   Allergies  Allergen Reactions  . Erythromycin Nausea And Vomiting    Social History:  reports that she has never smoked. She has never used smokeless tobacco. She reports that she does not drink alcohol or use illicit drugs.  Family History: Family History  Problem Relation Age of Onset  . Heart disease Father     Physical Exam: Filed Vitals:   05/26/16 0100 05/26/16 0111 05/26/16 0130 05/26/16 0200  BP: 165/77  161/88 137/85  Pulse: 83  83 87  Temp:      TempSrc:      Resp: 15  15 17   Height:  5\' 1"  (1.549 m)    Weight:  86.183 kg (190 lb)    SpO2: 93%  96% 97%    General:  Alert and oriented times three, well developed and nourished, no acute distress Eyes: PERRLA, pink conjunctiva, no scleral icterus ENT: Moist oral mucosa, neck supple, no thyromegaly Lungs: clear to ascultation, no wheeze, no crackles, no use of accessory muscles Cardiovascular: irregular rate and rhythm, no regurgitation, no gallops, no murmurs. No carotid bruits, no JVD Abdomen: soft, positive BS, non-tender, non-distended, no organomegaly, not an acute abdomen GU: not examined Neuro: CN II -  XII grossly intact, sensation intact Musculoskeletal: strength 5/5 all extremities, no clubbing, cyanosis or edema Skin: no rash, no subcutaneous crepitation, no decubitus Psych: appropriate patient   Labs on Admission:   Recent Labs  05/25/16 1859  NA 140  K 3.5  CL 104  CO2 29  GLUCOSE 118*  BUN 17  CREATININE 0.51  CALCIUM 8.9   No results for input(s): AST, ALT, ALKPHOS, BILITOT, PROT, ALBUMIN in the last 72 hours. No results for input(s): LIPASE, AMYLASE in the last 72 hours.  Recent Labs  05/25/16 1859  WBC 7.3  HGB 12.5  HCT 37.3  MCV 81.7  PLT 232     Recent Labs  05/25/16 1859  TROPONINI <0.03   Invalid input(s): POCBNP No results for input(s): DDIMER in the last 72 hours. No results for input(s): HGBA1C in the last 72 hours. No results for input(s): CHOL, HDL, LDLCALC, TRIG, CHOLHDL, LDLDIRECT in the last 72 hours. No results for input(s): TSH, T4TOTAL, T3FREE, THYROIDAB in the last 72 hours.  Invalid input(s): FREET3 No results for input(s): VITAMINB12, FOLATE, FERRITIN, TIBC, IRON, RETICCTPCT in the last 72 hours.  Micro Results: No results found for this or any previous visit (from the past 240 hour(s)).   Radiological Exams on Admission: Dg Chest 2 View  05/25/2016  CLINICAL DATA:  Shortness of Breath EXAM: CHEST  2 VIEW COMPARISON:  None. FINDINGS: There is no edema or consolidation. The heart size and pulmonary vascularity are normal. No adenopathy. There is degenerative change in the thoracic spine. IMPRESSION: No edema or consolidation. Electronically Signed   By: Lowella Grip III M.D.   On: 05/25/2016 19:41    Assessment/Plan Present on Admission:  . Atrial fibrillation ([place]) -bring in for 23 hour obs on telemetry -add metoprolol 25mg  BID. Norvasc discontinued -start Xarelto, ASA discontinued -TSH and 2D echo ordered  . Chest pressure -likely d/t above, cycle cardiac enzymes -monitor on telemetry  HTN -improved with IV treatment in ER. See above changes to home regimen -PRN blood pressure medications ordered  Dyslipidemia -Stable, resume home medications    Rayhaan Huster 05/26/2016, 2:27 AM

## 2016-05-26 NOTE — ED Notes (Signed)
REDS VEST READING= 41% CHEST RULER=7.5  VEST FITTING TASKS: POSTURE=S HEIGHT MARKER=s CENTER STRIP=a  COMMENTS:approved by [employer]

## 2016-05-26 NOTE — Progress Notes (Signed)
Patient was admitted from the following c/o SOB. Patient was ambulatory on arrival to the unit. She was A&O X4. Patient was oriented to the unit, skin check was completed with another RN.  Patient denied SOB and chest pain, she remained in afib in the 70s-80s w/o any symptoms.  Dr. Estanislado Pandy was notified of potassium of 2.9. 59mEq of potassium ordered.

## 2016-05-26 NOTE — Progress Notes (Signed)
MEDICATION RELATED CONSULT NOTE - INITIAL   Pharmacy Consult for Electrolytes Indication: hypokalemia  Allergies  Allergen Reactions  . Erythromycin Nausea And Vomiting    Patient Measurements: Height: 5\' 1"  (154.9 cm) Weight: 194 lb 11.2 oz (88.315 kg) IBW/kg (Calculated) : 47.8   Vital Signs: Temp: 98.5 F (36.9 C) (05/31 1309) Temp Source: Oral (05/31 1309) BP: 149/84 mmHg (05/31 1309) Pulse Rate: 74 (05/31 1309) Intake/Output from previous day:   Intake/Output from this shift: Total I/O In: 360 [P.O.:360] Out: 1200 [Urine:1200]  Labs:  Recent Labs  05/25/16 1859 05/26/16 0432 05/26/16 1618  WBC 7.3 7.1  --   HGB 12.5 12.2  --   HCT 37.3 35.4  --   PLT 232 222  --   CREATININE 0.51 0.35*  --   MG  --   --  2.2   Estimated Creatinine Clearance: 55.7 mL/min (by C-G formula based on Cr of 0.35).   Microbiology: No results found for this or any previous visit (from the past 720 hour(s)).  Medical History: Past Medical History  Diagnosis Date  . Allergic rhinitis   . Hypercholesteremia   . History of pelvic mass     s/p resection, benign  . Hypertension   . Varicella zoster 2007  . Osteoporosis     per pt report on Medical Screening Form     Medications:  Scheduled:  . furosemide  20 mg Intravenous Daily  . losartan  50 mg Oral Daily  . metoprolol tartrate  25 mg Oral BID  . [START ON 05/27/2016] rivaroxaban  20 mg Oral Q supper  . simvastatin  20 mg Oral q1800  . sodium chloride flush  3 mL Intravenous Q12H  . timolol  1 drop Both Eyes Daily    Assessment: Pharmacy consulted to supplement electrolytes in an 82 yo female admitted for atrial fibrillation. MD ordered KCl 40 meq po once.   K: 2.9, magnesium =2.2 5/31 @ 1618 K=3.4  Goal of Therapy:  K: 3.5-5.1 Mag: 1.7-2.4  Plan:  Will give another 20 MEQ po of KCl and recheck K and Mg in the AM.  Pharmacy will continue to follow.   Ramond Dial, Pharm.D Clinical Pharmacist    05/26/2016,4:57 PM

## 2016-05-27 ENCOUNTER — Inpatient Hospital Stay: Payer: Medicare Other

## 2016-05-27 DIAGNOSIS — I208 Other forms of angina pectoris: Secondary | ICD-10-CM | POA: Diagnosis not present

## 2016-05-27 LAB — BASIC METABOLIC PANEL
Anion gap: 5 (ref 5–15)
BUN: 15 mg/dL (ref 6–20)
CO2: 27 mmol/L (ref 22–32)
Calcium: 8.7 mg/dL — ABNORMAL LOW (ref 8.9–10.3)
Chloride: 107 mmol/L (ref 101–111)
Creatinine, Ser: 0.44 mg/dL (ref 0.44–1.00)
GFR calc Af Amer: >60 mL/min (ref >60)
Glucose, Bld: 110 mg/dL — ABNORMAL HIGH (ref 65–99)
POTASSIUM: 3.5 mmol/L (ref 3.5–5.1)
SODIUM: 139 mmol/L (ref 135–145)

## 2016-05-27 LAB — NM MYOCAR MULTI W/SPECT W/WALL MOTION / EF
CHL CUP MPHR: 139 {beats}/min
CHL CUP NUCLEAR SDS: 1
CHL CUP RESTING HR STRESS: 77 {beats}/min
CHL CUP STRESS STAGE 2 SPEED: 0 mph
CHL CUP STRESS STAGE 3 GRADE: 0 %
CHL CUP STRESS STAGE 3 HR: 96 {beats}/min
CHL CUP STRESS STAGE 3 SPEED: 0 mph
CHL CUP STRESS STAGE 4 HR: 95 {beats}/min
CHL CUP STRESS STAGE 5 GRADE: 0 %
CHL CUP STRESS STAGE 5 HR: 90 {beats}/min
CSEPED: 1 min
CSEPHR: 71 %
CSEPPMHR: 69 %
Estimated workload: 1 METS
Exercise duration (sec): 1 s
LV dias vol: 63 mL (ref 46–106)
LV sys vol: 25 mL
Peak HR: 96 {beats}/min
SRS: 1
SSS: 3
Stage 1 Grade: 0 %
Stage 1 HR: 77 {beats}/min
Stage 1 Speed: 0 mph
Stage 2 Grade: 0 %
Stage 2 HR: 76 {beats}/min
Stage 4 Grade: 0 %
Stage 4 Speed: 0 mph
Stage 5 DBP: 80 mmHg
Stage 5 SBP: 160 mmHg
Stage 5 Speed: 0 mph
TID: 0.94

## 2016-05-27 LAB — PHOSPHORUS: Phosphorus: 3.9 mg/dL (ref 2.5–4.6)

## 2016-05-27 LAB — ECHOCARDIOGRAM COMPLETE
HEIGHTINCHES: 61 in
Weight: 3115.2 oz

## 2016-05-27 LAB — MAGNESIUM: MAGNESIUM: 2.1 mg/dL (ref 1.7–2.4)

## 2016-05-27 MED ORDER — METOPROLOL TARTRATE 25 MG PO TABS: 25.0000 mg | ORAL_TABLET | Freq: Two times a day (BID) | ORAL | Status: DC

## 2016-05-27 MED ORDER — TECHNETIUM TC 99M TETROFOSMIN IV KIT
30.0000 | PACK | Freq: Once | INTRAVENOUS | Status: AC | PRN
  Administered 2016-05-27: 30.236 via INTRAVENOUS

## 2016-05-27 MED ORDER — POTASSIUM CHLORIDE CRYS ER 20 MEQ PO TBCR
20.0000 meq | EXTENDED_RELEASE_TABLET | Freq: Every day | ORAL | Status: DC
  Administered 2016-05-27: 20 meq via ORAL
  Filled 2016-05-27: qty 1

## 2016-05-27 MED ORDER — RIVAROXABAN 20 MG PO TABS: 20.0000 mg | ORAL_TABLET | Freq: Every day | ORAL | Status: DC

## 2016-05-27 MED ORDER — REGADENOSON 0.4 MG/5ML IV SOLN
0.4000 mg | Freq: Once | INTRAVENOUS | Status: AC
  Administered 2016-05-27: 0.4 mg via INTRAVENOUS
  Filled 2016-05-27: qty 5

## 2016-05-27 MED ORDER — TECHNETIUM TC 99M TETROFOSMIN IV KIT
13.0000 | PACK | Freq: Once | INTRAVENOUS | Status: AC | PRN
  Administered 2016-05-27: 13.48 via INTRAVENOUS

## 2016-05-27 NOTE — Progress Notes (Signed)
MEDICATION RELATED CONSULT NOTE -Follow Up  Pharmacy Consult for Electrolytes Indication: hypokalemia  Allergies  Allergen Reactions  . Erythromycin Nausea And Vomiting    Patient Measurements: Height: 5\' 1"  (154.9 cm) Weight: 194 lb 11.2 oz (88.315 kg) IBW/kg (Calculated) : 47.8   Vital Signs: Temp: 97.9 F (36.6 C) (06/01 0422) Temp Source: Oral (05/31 2021) BP: 161/67 mmHg (06/01 0422) Pulse Rate: 69 (06/01 0422) Intake/Output from previous day: 05/31 0701 - 06/01 0700 In: 603 [P.O.:600; I.V.:3] Out: 2790 [Urine:2790] Intake/Output from this shift:    Labs:  Recent Labs  05/25/16 1859 05/26/16 0432 05/26/16 1618 05/27/16 0501  WBC 7.3 7.1  --   --   HGB 12.5 12.2  --   --   HCT 37.3 35.4  --   --   PLT 232 222  --   --   CREATININE 0.51 0.35*  --  0.44  MG  --   --  2.2 2.1  PHOS  --   --   --  3.9   Estimated Creatinine Clearance: 55.7 mL/min (by C-G formula based on Cr of 0.44).   Microbiology: No results found for this or any previous visit (from the past 720 hour(s)).  Medical History: Past Medical History  Diagnosis Date  . Allergic rhinitis   . Hypercholesteremia   . History of pelvic mass     s/p resection, benign  . Hypertension   . Varicella zoster 2007  . Osteoporosis     per pt report on Medical Screening Form     Medications:  Scheduled:  . furosemide  20 mg Intravenous Daily  . losartan  50 mg Oral Daily  . metoprolol tartrate  25 mg Oral BID  . potassium chloride  20 mEq Oral Daily  . rivaroxaban  20 mg Oral Q supper  . simvastatin  20 mg Oral q1800  . sodium chloride flush  3 mL Intravenous Q12H  . timolol  1 drop Both Eyes Daily    Assessment: Pharmacy consulted to supplement electrolytes in an 82 yo female admitted for atrial fibrillation. Patient received a total dose of 60 meq of oral potassium on 5/31.  K: 3.5, Magnesium: 2.1, Phos: 3.9  Goal of Therapy:  K: 3.5-5.1 Mag: 1.7-2.4  Plan:  Patient has orders for  furosemide 20 mg IV daily.  Will order daily supplementation with KCl 20 meq po daily as potassium is just within goal range.  Will recheck labs in AM to further assess.   Pharmacy will continue to follow.   Leetta Hendriks G 05/27/2016,7:45 AM

## 2016-05-27 NOTE — Progress Notes (Signed)
For stress test today; NPO after midnight; Barbaraann Faster, RN 6:18 AM; 05/27/2016

## 2016-05-27 NOTE — Discharge Instructions (Addendum)
Information on my medicine - XARELTO (Rivaroxaban)  This medication education was reviewed with me or my healthcare representative as part of my discharge preparation.  The pharmacist that spoke with me during my hospital stay was:  Adonai Helzer G, [place]  Why was Xarelto prescribed for you? Xarelto was prescribed for you to reduce the risk of a blood clot forming that can cause a stroke if you have a medical condition called atrial fibrillation (a type of irregular heartbeat).  What do you need to know about xarelto ? Take your Xarelto ONCE DAILY at the same time every day with your evening meal. If you have difficulty swallowing the tablet whole, you may crush it and mix in applesauce just prior to taking your dose.  Take Xarelto exactly as prescribed by your doctor and DO NOT stop taking Xarelto without talking to the doctor who prescribed the medication.  Stopping without other stroke prevention medication to take the place of Xarelto may increase your risk of developing a clot that causes a stroke.  Refill your prescription before you run out.  After discharge, you should have regular check-up appointments with your healthcare provider that is prescribing your Xarelto.  In the future your dose may need to be changed if your kidney function or weight changes by a significant amount.  What do you do if you miss a dose? If you are taking Xarelto ONCE DAILY and you miss a dose, take it as soon as you remember on the same day then continue your regularly scheduled once daily regimen the next day. Do not take two doses of Xarelto at the same time or on the same day.   Important Safety Information A possible side effect of Xarelto is bleeding. You should call your healthcare provider right away if you experience any of the following: ? Bleeding from an injury or your nose that does not stop. ? Unusual colored urine (red or dark brown) or unusual colored stools (red or  black). ? Unusual bruising for unknown reasons. ? A serious fall or if you hit your head (even if there is no bleeding).  Some medicines may interact with Xarelto and might increase your risk of bleeding while on Xarelto. To help avoid this, consult your healthcare provider or pharmacist prior to using any new prescription or non-prescription medications, including herbals, vitamins, non-steroidal anti-inflammatory drugs (NSAIDs) and supplements.  This website has more information on Xarelto: [email or web address]

## 2016-05-27 NOTE — Progress Notes (Signed)
Patient d/c'd home. Education provided, no questions at this time. Patient picked up by son. Telemetry removed. Lunabelle Oatley R [place] 

## 2016-05-27 NOTE — Consult Note (Signed)
Reason for Consult: Shortness of breath atrial fibrillation possible angina Referring Physician: Ronette Deter primary Dr Vianne Bulls hospitalist  Tina Howard is an 82 y.o. female.  HPI: Patient's a 82 year old female presents with shortness of breath over the last 2 weeks recently had her lisinopril switched to losartan about 2 weeks ago patient developed worsening shortness of breath went to her primary physician today EKG was done noted to have atrial fibrillation which appears to be new since she was sent to the emergency room for evaluation. Patient complains of dyspnea or shortness of breath and mild palpitations but no syncope no nausea vomiting she states that was not felt well after the change in medication she has more shortness of breath some leg edema. He describes chest pressure across her chest tightness which is also She denies any previous coronary disease or last stress test was over 5 or 6 years ago  Past Medical History  Diagnosis Date  . Allergic rhinitis   . Hypercholesteremia   . History of pelvic mass     s/p resection, benign  . Hypertension   . Varicella zoster 2007  . Osteoporosis     per pt report on Medical Screening Form     Past Surgical History  Procedure Laterality Date  . Total knee arthroplasty      left  . Vaginal hysterectomy      benign reasons  . Knee arthroscopy      right  . Cataract extraction Bilateral 2014    Dr. Sandra Cockayne  . Foot surgery      Family History  Problem Relation Age of Onset  . Heart disease Father     Social History:  reports that she has never smoked. She has never used smokeless tobacco. She reports that she does not drink alcohol or use illicit drugs.  Allergies:  Allergies  Allergen Reactions  . Erythromycin Nausea And Vomiting    Medications: I have reviewed the patient's current medications.  Results for orders placed or performed during the hospital encounter of 05/25/16 (from the past 48 hour(s))   CBC     Status: Abnormal   Collection Time: 05/25/16  6:59 PM  Result Value Ref Range   WBC 7.3 3.6 - 11.0 K/uL   RBC 4.56 3.80 - 5.20 MIL/uL   Hemoglobin 12.5 12.0 - 16.0 g/dL   HCT 37.3 35.0 - 47.0 %   MCV 81.7 80.0 - 100.0 fL   MCH 27.3 26.0 - 34.0 pg   MCHC 33.4 32.0 - 36.0 g/dL   RDW 14.6 (H) 11.5 - 14.5 %   Platelets 232 150 - 440 K/uL  Basic metabolic panel     Status: Abnormal   Collection Time: 05/25/16  6:59 PM  Result Value Ref Range   Sodium 140 135 - 145 mmol/L   Potassium 3.5 3.5 - 5.1 mmol/L   Chloride 104 101 - 111 mmol/L   CO2 29 22 - 32 mmol/L   Glucose, Bld 118 (H) 65 - 99 mg/dL   BUN 17 6 - 20 mg/dL   Creatinine, Ser 0.51 0.44 - 1.00 mg/dL   Calcium 8.9 8.9 - 10.3 mg/dL   GFR calc non Af Amer >60 >60 mL/min   GFR calc Af Amer >60 >60 mL/min    Comment: (NOTE) The eGFR has been calculated using the CKD EPI equation. This calculation has not been validated in all clinical situations. eGFR's persistently <60 mL/min signify possible Chronic Kidney Disease.    Anion gap 7  5 - 15  Brain natriuretic peptide     Status: Abnormal   Collection Time: 05/25/16  6:59 PM  Result Value Ref Range   B Natriuretic Peptide 190.0 (H) 0.0 - 100.0 pg/mL  Troponin I     Status: None   Collection Time: 05/25/16  6:59 PM  Result Value Ref Range   Troponin I <0.03 <0.031 ng/mL    Comment:        NO INDICATION OF MYOCARDIAL INJURY.   Troponin I     Status: None   Collection Time: 05/26/16  4:32 AM  Result Value Ref Range   Troponin I <0.03 <0.031 ng/mL    Comment:        NO INDICATION OF MYOCARDIAL INJURY.   Basic metabolic panel     Status: Abnormal   Collection Time: 05/26/16  4:32 AM  Result Value Ref Range   Sodium 141 135 - 145 mmol/L   Potassium 2.9 (LL) 3.5 - 5.1 mmol/L    Comment: CRITICAL RESULT CALLED TO, READ BACK BY AND VERIFIED WITH TUNJI BAKARE AT 3716 ON 05/26/16.Marland KitchenMarland KitchenMercy Medical Center    Chloride 104 101 - 111 mmol/L   CO2 30 22 - 32 mmol/L   Glucose, Bld 118  (H) 65 - 99 mg/dL   BUN 12 6 - 20 mg/dL   Creatinine, Ser 0.35 (L) 0.44 - 1.00 mg/dL   Calcium 8.7 (L) 8.9 - 10.3 mg/dL   GFR calc non Af Amer >60 >60 mL/min   GFR calc Af Amer >60 >60 mL/min    Comment: (NOTE) The eGFR has been calculated using the CKD EPI equation. This calculation has not been validated in all clinical situations. eGFR's persistently <60 mL/min signify possible Chronic Kidney Disease.    Anion gap 7 5 - 15  CBC     Status: Abnormal   Collection Time: 05/26/16  4:32 AM  Result Value Ref Range   WBC 7.1 3.6 - 11.0 K/uL   RBC 4.44 3.80 - 5.20 MIL/uL   Hemoglobin 12.2 12.0 - 16.0 g/dL   HCT 35.4 35.0 - 47.0 %   MCV 79.8 (L) 80.0 - 100.0 fL   MCH 27.4 26.0 - 34.0 pg   MCHC 34.4 32.0 - 36.0 g/dL   RDW 14.6 (H) 11.5 - 14.5 %   Platelets 222 150 - 440 K/uL  TSH     Status: None   Collection Time: 05/26/16  4:32 AM  Result Value Ref Range   TSH 2.713 0.350 - 4.500 uIU/mL  Troponin I     Status: None   Collection Time: 05/26/16 10:13 AM  Result Value Ref Range   Troponin I <0.03 <0.031 ng/mL    Comment:        NO INDICATION OF MYOCARDIAL INJURY.   Troponin I     Status: None   Collection Time: 05/26/16  4:18 PM  Result Value Ref Range   Troponin I <0.03 <0.031 ng/mL    Comment:        NO INDICATION OF MYOCARDIAL INJURY.   Potassium     Status: Abnormal   Collection Time: 05/26/16  4:18 PM  Result Value Ref Range   Potassium 3.4 (L) 3.5 - 5.1 mmol/L  Magnesium     Status: None   Collection Time: 05/26/16  4:18 PM  Result Value Ref Range   Magnesium 2.2 1.7 - 2.4 mg/dL  Basic metabolic panel     Status: Abnormal   Collection Time: 05/27/16  5:01 AM  Result  Value Ref Range   Sodium 139 135 - 145 mmol/L   Potassium 3.5 3.5 - 5.1 mmol/L   Chloride 107 101 - 111 mmol/L   CO2 27 22 - 32 mmol/L   Glucose, Bld 110 (H) 65 - 99 mg/dL   BUN 15 6 - 20 mg/dL   Creatinine, Ser 0.44 0.44 - 1.00 mg/dL   Calcium 8.7 (L) 8.9 - 10.3 mg/dL   GFR calc non Af Amer  >60 >60 mL/min   GFR calc Af Amer >60 >60 mL/min    Comment: (NOTE) The eGFR has been calculated using the CKD EPI equation. This calculation has not been validated in all clinical situations. eGFR's persistently <60 mL/min signify possible Chronic Kidney Disease.    Anion gap 5 5 - 15  Magnesium     Status: None   Collection Time: 05/27/16  5:01 AM  Result Value Ref Range   Magnesium 2.1 1.7 - 2.4 mg/dL  Phosphorus     Status: None   Collection Time: 05/27/16  5:01 AM  Result Value Ref Range   Phosphorus 3.9 2.5 - 4.6 mg/dL    Dg Chest 2 View  05/25/2016  CLINICAL DATA:  Shortness of Breath EXAM: CHEST  2 VIEW COMPARISON:  None. FINDINGS: There is no edema or consolidation. The heart size and pulmonary vascularity are normal. No adenopathy. There is degenerative change in the thoracic spine. IMPRESSION: No edema or consolidation. Electronically Signed   By: Lowella Grip III M.D.   On: 05/25/2016 19:41    Review of Systems  Constitutional: Positive for malaise/fatigue.  HENT: Positive for congestion.   Eyes: Negative.   Respiratory: Positive for shortness of breath.   Cardiovascular: Positive for chest pain, palpitations and orthopnea.  Gastrointestinal: Negative.   Genitourinary: Negative.   Musculoskeletal: Negative.   Skin: Negative.   Neurological: Positive for weakness.  Endo/Heme/Allergies: Negative.   Psychiatric/Behavioral: Negative.    Blood pressure 161/67, pulse 69, temperature 97.9 F (36.6 C), temperature source Oral, resp. rate 20, height '5\' 1"'  (1.549 m), weight 88.315 kg (194 lb 11.2 oz), SpO2 97 %. Physical Exam  Nursing note and vitals reviewed. Constitutional: She is oriented to person, place, and time. She appears well-developed and well-nourished.  HENT:  Head: Normocephalic and atraumatic.  Eyes: Conjunctivae and EOM are normal. Pupils are equal, round, and reactive to light.  Neck: Normal range of motion. Neck supple.  Cardiovascular: Normal  rate and normal pulses.  An irregularly irregular rhythm present. Exam reveals gallop.   Murmur heard.  Systolic murmur is present with a grade of 2/6  Respiratory: Effort normal and breath sounds normal.  GI: Soft. Bowel sounds are normal.  Musculoskeletal: Normal range of motion.  Neurological: She is alert and oriented to person, place, and time. She has normal reflexes.  Skin: Skin is warm.  Psychiatric: She has a normal mood and affect.    Assessment/Plan: Angina Atrial fibrillation Shortness of breath Hyperlipidemia Obesity Hypertension Edema . Plan Agree with rule out myocardial infarction Continue telemetry for atrial fibrillation Continue beta blockers for rate control Recommend long-term anticoagulation either with relative oral liquids Procedure with Myoview for evaluation of ischemia Consider low-dose diuretic therapy Hypertension control with ACE inhibitor/beta blocker Have the patient follow-up with Dr. Nehemiah Massed as an outpatient   Hanish Laraia D. 05/27/2016, 9:10 AM

## 2016-05-27 NOTE — Progress Notes (Signed)
Per Dr. Jasmine Pang patient okay to discharge if stress test normal. Per Dr. Nehemiah Massed patient stress test is low risk study. Patient to be d/c'd shortly. Wilnette Kales

## 2016-05-28 ENCOUNTER — Telehealth: Payer: Self-pay | Admitting: *Deleted

## 2016-05-28 NOTE — Telephone Encounter (Signed)
1pm on Monday? 1min

## 2016-05-28 NOTE — Discharge Summary (Signed)
Tina Howard, is a 82 y.o. female  DOB Oct 18, 1933  MRN [id number].  Admission date:  05/25/2016  Admitting Physician  Quintella Baton, MD  Discharge Date:  05/27/2016   Primary MD  Rica Mast, MD  Recommendations for primary care physician for things to follow:   Follow-up with primary doctor in 1 week   Admission Diagnosis  New onset atrial fibrillation (HCC) [I48.91] Congestive heart failure, unspecified congestive heart failure chronicity, unspecified congestive heart failure type ([place]) [I50.9]   Discharge Diagnosis  New onset atrial fibrillation ([place]) [I48.91] Congestive heart failure, unspecified congestive heart failure chronicity, unspecified congestive heart failure type ([place]) [I50.9]    Principal Problem:   Chest pressure Active Problems:   Atrial fibrillation (HCC)   HTN (hypertension)   Dyslipidemia   Hypokalemia      Past Medical History  Diagnosis Date  . Allergic rhinitis   . Hypercholesteremia   . History of pelvic mass     s/p resection, benign  . Hypertension   . Varicella zoster 2007  . Osteoporosis     per pt report on Medical Screening Form     Past Surgical History  Procedure Laterality Date  . Total knee arthroplasty      left  . Vaginal hysterectomy      benign reasons  . Knee arthroscopy      right  . Cataract extraction Bilateral 2014    Dr. Sandra Cockayne  . Foot surgery         History of present illness and  Hospital Course:     Kindly see H&P for history of present illness and admission details, please review complete Labs, Consult reports and Test reports for all details in brief  HPI  from the history and physical done on the day of admission 82 year old female patient having shortness of breath for 2 weeks,sent byPCP  For afib/ went in  see primary doctor  and she was found to have atrial fibrillation's and also elevated blood pressure up to 187/109. Admitted for new-onset atrial fibrillation's and chest pressure.  Hospital Course  #1 acute on chronic diastolic heart failure causing pedal edema and shortness of breath improved with IV Lasix. Patient did not  Discharge. With lasix as she is not having sob or leg edema. No sign of fluid overload. Patient can follow up with Dr. Nehemiah Massed and see if she needs Lasix as an outpatient. Clinically had no leg edema. No shortness of breath. # 2 chest pressure likely secondary to atrial fibrillation. Patient had a Lexiscan stress test, results that showed no evidence of ischemia.  3.atrial fibrillation new onset: Seen by cardiology. CHADS VASc2 score atleast 3;started on Xarelto. Discussed the risks and benefits of undergoing evaluation with the patient. Discharge home with Xarelto. Also started on Lopressor Milligrams by mouth twice a day for rate control. Heart rate 73, O2 sats 97% on room air and  bp  128/70 at discharge.  Discharge Condition: stable   Follow UP  Follow-up Information    Follow up with Rica Mast, MD.   Specialty:  Internal Medicine   Contact information:   [id number] [place]  [address] [phone]         Discharge Instructions  and  Discharge Medications        Medication List    TAKE these medications        aspirin 81 MG tablet  Take 81 mg by mouth daily.     losartan 50  MG tablet  Commonly known as:  COZAAR  Take 1 tablet (50 mg total) by mouth daily.     metoprolol tartrate 25 MG tablet  Commonly known as:  LOPRESSOR  Take 1 tablet (25 mg total) by mouth 2 (two) times daily.     rivaroxaban 20 MG Tabs tablet  Commonly known as:  XARELTO  Take 1 tablet (20 mg total) by mouth daily with supper.     simvastatin 20 MG tablet  Commonly known as:  ZOCOR  Take 1 tablet by mouth  daily     STOOL SOFTENER PO  Take 1  capsule by mouth 2 (two) times daily as needed. Take by mouth as directed     timolol 0.5 % ophthalmic solution  Commonly known as:  TIMOPTIC  Place 1 drop into both eyes daily.          Diet and Activity recommendation: See Discharge Instructions above   Consults obtained - cardio   Major procedures and Radiology Reports - PLEASE review detailed and final reports for all details, in brief -     Dg Chest 2 View  05/25/2016  CLINICAL DATA:  Shortness of Breath EXAM: CHEST  2 VIEW COMPARISON:  None. FINDINGS: There is no edema or consolidation. The heart size and pulmonary vascularity are normal. No adenopathy. There is degenerative change in the thoracic spine. IMPRESSION: No edema or consolidation. Electronically Signed   By: Lowella Grip III M.D.   On: 05/25/2016 19:41   Nm Myocar Multi W/spect W/wall Motion / Ef  05/27/2016   There was no ST segment deviation noted during stress.  The study is normal.  This is a low risk study.  The left ventricular ejection fraction is hyperdynamic (>65%).     Micro Results    No results found for this or any previous visit (from the past 240 hour(s)).     Today   Subjective:   Tina Howard today has no headache,no chest abdominal pain,no new weakness tingling or numbness, feels much better wants to go home today.   Objective:   Blood pressure 128/70, pulse 73, temperature 97.7 F (36.5 C), temperature source Oral, resp. rate 18, height 5\' 1"  (1.549 m), weight 88.315 kg (194 lb 11.2 oz), SpO2 97 %.   Intake/Output Summary (Last 24 hours) at 05/28/16 0657 Last data filed at 05/27/16 1238  Gross per 24 hour  Intake    240 ml  Output   1450 ml  Net  -1210 ml    Exam Awake Alert, Oriented x 3, No new F.N deficits, Normal affect [place].AT,PERRAL Supple Neck,No JVD, No cervical lymphadenopathy appriciated.  Symmetrical Chest wall movement, Good air movement bilaterally, CTAB RRR,No Gallops,Rubs or new Murmurs, No  Parasternal Heave +ve B.Sounds, Abd Soft, Non tender, No organomegaly appriciated, No rebound -guarding or rigidity. No Cyanosis, Clubbing or edema, No new Rash or bruise  Data Review   CBC w Diff: Lab Results  Component Value Date   WBC 7.1 05/26/2016   WBC 5.5 02/16/2012   HGB 12.2 05/26/2016   HGB 12.1 02/16/2012   HCT 35.4 05/26/2016   HCT 36.0 02/16/2012   PLT 222 05/26/2016   PLT 202 02/16/2012   LYMPHOPCT 22.2 11/13/2015   LYMPHOPCT 30.7 02/16/2012   MONOPCT 9.5 11/13/2015   MONOPCT 8.7 02/16/2012   EOSPCT 4.1 11/13/2015   EOSPCT 6.1 02/16/2012   BASOPCT 0.5 11/13/2015   BASOPCT 0.7 02/16/2012    CMP: Lab Results  Component Value Date  NA 139 05/27/2016   NA 143 02/16/2012   K 3.5 05/27/2016   K 3.8 02/16/2012   CL 107 05/27/2016   CL 103 02/16/2012   CO2 27 05/27/2016   CO2 29 02/16/2012   BUN 15 05/27/2016   BUN 16 02/16/2012   CREATININE 0.44 05/27/2016   CREATININE 0.49* 02/16/2012   PROT 7.0 04/01/2016   ALBUMIN 4.3 04/01/2016   BILITOT 0.6 04/01/2016   ALKPHOS 58 04/01/2016   AST 17 04/01/2016   ALT 14 04/01/2016  .   Total Time in preparing paper work, data evaluation and todays exam - 75 minutes  Liliana Dang M.D on 05/27/2016 at 6:57 AM    Note: This dictation was prepared with Dragon dictation along with smaller phrase technology. Any transcriptional errors that result from this process are unintentional.

## 2016-05-28 NOTE — Telephone Encounter (Signed)
Spoke with patient and scheduled.

## 2016-05-28 NOTE — Telephone Encounter (Signed)
Patient was discharged from [facility] on 05/27/16 for SOB. Please advise a place on Dr. Gilford Rile Schedule to place patient on the coming week.

## 2016-05-31 ENCOUNTER — Encounter: Payer: Self-pay | Admitting: Internal Medicine

## 2016-05-31 ENCOUNTER — Ambulatory Visit (INDEPENDENT_AMBULATORY_CARE_PROVIDER_SITE_OTHER): Payer: Medicare Other | Admitting: Internal Medicine

## 2016-05-31 VITALS — BP 136/74 | HR 66 | Ht 61.0 in | Wt 196.0 lb

## 2016-05-31 DIAGNOSIS — I4891 Unspecified atrial fibrillation: Secondary | ICD-10-CM

## 2016-05-31 NOTE — Patient Instructions (Signed)
Continue current medications. Follow up with cardiology as scheduled. Call immediately if any palpitations, chest pain, shortness of breath, or other concerns.  Atrial Fibrillation Atrial fibrillation is a type of irregular or rapid heartbeat (arrhythmia). In atrial fibrillation, the heart quivers continuously in a chaotic pattern. This occurs when parts of the heart receive disorganized signals that make the heart unable to pump blood normally. This can increase the risk for stroke, heart failure, and other heart-related conditions. There are different types of atrial fibrillation, including:  Paroxysmal atrial fibrillation. This type starts suddenly, and it usually stops on its own shortly after it starts.  Persistent atrial fibrillation. This type often lasts longer than a week. It may stop on its own or with treatment.  Long-lasting persistent atrial fibrillation. This type lasts longer than 12 months.  Permanent atrial fibrillation. This type does not go away. Talk with your health care provider to learn about the type of atrial fibrillation that you have. CAUSES This condition is caused by some heart-related conditions or procedures, including:  A heart attack.  Coronary artery disease.  Heart failure.  Heart valve conditions.  High blood pressure.  Inflammation of the sac that surrounds the heart (pericarditis).  Heart surgery.  Certain heart rhythm disorders, such as Wolf-Parkinson-White syndrome. Other causes include:  Pneumonia.  Obstructive sleep apnea.  Blockage of an artery in the lungs (pulmonary embolism, or PE).  Lung cancer.  Chronic lung disease.  Thyroid problems, especially if the thyroid is overactive (hyperthyroidism).  Caffeine.  Excessive alcohol use or illegal drug use.  Use of some medicines, including certain decongestants and diet pills. Sometimes, the cause cannot be found. RISK FACTORS This condition is more likely to develop  in:  People who are older in age.  People who smoke.  People who have diabetes mellitus.  People who are overweight (obese).  Athletes who exercise vigorously. SYMPTOMS Symptoms of this condition include:  A feeling that your heart is beating rapidly or irregularly.  A feeling of discomfort or pain in your chest.  Shortness of breath.  Sudden light-headedness or weakness.  Getting tired easily during exercise. In some cases, there are no symptoms. DIAGNOSIS Your health care provider may be able to detect atrial fibrillation when taking your pulse. If detected, this condition may be diagnosed with:  An electrocardiogram (ECG).  A Holter monitor test that records your heartbeat patterns over a 24-hour period.  Transthoracic echocardiogram (TTE) to evaluate how blood flows through your heart.  Transesophageal echocardiogram (TEE) to view more detailed images of your heart.  A stress test.  Imaging tests, such as a CT scan or chest X-ray.  Blood tests. TREATMENT The main goals of treatment are to prevent blood clots from forming and to keep your heart beating at a normal rate and rhythm. The type of treatment that you receive depends on many factors, such as your underlying medical conditions and how you feel when you are experiencing atrial fibrillation. This condition may be treated with:  Medicine to slow down the heart rate, bring the heart's rhythm back to normal, or prevent clots from forming.  Electrical cardioversion. This is a procedure that resets your heart's rhythm by delivering a controlled, low-energy shock to the heart through your skin.  Different types of ablation, such as catheter ablation, catheter ablation with pacemaker, or surgical ablation. These procedures destroy the heart tissues that send abnormal signals. When the pacemaker is used, it is placed under your skin to help your heart beat  in a regular rhythm. HOME CARE INSTRUCTIONS  Take over-the  counter and prescription medicines only as told by your health care provider.  If your health care provider prescribed a blood-thinning medicine (anticoagulant), take it exactly as told. Taking too much blood-thinning medicine can cause bleeding. If you do not take enough blood-thinning medicine, you will not have the protection that you need against stroke and other problems.  Do not use tobacco products, including cigarettes, chewing tobacco, and e-cigarettes. If you need help quitting, ask your health care provider.  If you have obstructive sleep apnea, manage your condition as told by your health care provider.  Do not drink alcohol.  Do not drink beverages that contain caffeine, such as coffee, soda, and tea.  Maintain a healthy weight. Do not use diet pills unless your health care provider approves. Diet pills may make heart problems worse.  Follow diet instructions as told by your health care provider.  Exercise regularly as told by your health care provider.  Keep all follow-up visits as told by your health care provider. This is important. PREVENTION  Avoid drinking beverages that contain caffeine or alcohol.  Avoid certain medicines, especially medicines that are used for breathing problems.  Avoid certain herbs and herbal medicines, such as those that contain ephedra or ginseng.  Do not use illegal drugs, such as cocaine and amphetamines.  Do not smoke.  Manage your high blood pressure. SEEK MEDICAL CARE IF:  You notice a change in the rate, rhythm, or strength of your heartbeat.  You are taking an anticoagulant and you notice increased bruising.  You tire more easily when you exercise or exert yourself. SEEK IMMEDIATE MEDICAL CARE IF:  You have chest pain, abdominal pain, sweating, or weakness.  You feel nauseous.  You notice blood in your vomit, bowel movement, or urine.  You have shortness of breath.  You suddenly have swollen feet and ankles.  You  feel dizzy.  You have sudden weakness or numbness of the face, arm, or leg, especially on one side of the body.  You have trouble speaking, trouble understanding, or both (aphasia).  Your face or your eyelid droops on one side. These symptoms may represent a serious problem that is an emergency. Do not wait to see if the symptoms will go away. Get medical help right away. Call your local emergency services (911 in the [place] Do not drive yourself to the hospital.   This information is not intended to replace advice given to you by your health care provider. Make sure you discuss any questions you have with your health care provider.   Document Released: 12/13/2005 Document Revised: 09/03/2015 Document Reviewed: 04/09/2015 Elsevier Interactive Patient Education [employer].

## 2016-05-31 NOTE — Progress Notes (Signed)
Pre visit review using our clinic review tool, if applicable. No additional management support is needed unless otherwise documented below in the visit note. 

## 2016-05-31 NOTE — Assessment & Plan Note (Addendum)
New onset AFIB. Reviewed notes from hospitalization. EKG today shows afib with rate of 60. Rate well controlled on Metoprolol and anticoagulated with Xarelto. Follow up with cardiology pending for 6/16.  Follow up here in 3 months.

## 2016-05-31 NOTE — Progress Notes (Signed)
Subjective:    Patient ID: Tina Howard, female    DOB: 1933/08/26, 82 y.o.   MRN: [id number]  HPI  82YO female presents for hospital follow up.  ADMITTED: 05/25/2016 DISCHARGED: 05/27/2016  DIAGNOSIS: New Onset AFIB, CHF  Presented with dyspnea and exhaustion for 2 weeks. Noted to be in afib. Started on Xarelto and Lopressor with follow up with cardiology. Has not yet seen cardiology. Scheduled to see cardiology 6/16.  ECHO showed EF of 65-70%. CXR was normal. Myoview was normal.  Since discharged has had some mild headache.  No bleeding or bruising noted. No chest pain or dyspnea. Has some chronic aching pain left lateral chest wall with movement. This is unchanged.   Wt Readings from Last 3 Encounters:  05/31/16 196 lb (88.905 kg)  05/26/16 194 lb 11.2 oz (88.315 kg)  05/25/16 201 lb 6 oz (91.343 kg)   BP Readings from Last 3 Encounters:  05/31/16 136/74  05/27/16 128/70  05/25/16 142/86    Past Medical History  Diagnosis Date  . Allergic rhinitis   . Hypercholesteremia   . History of pelvic mass     s/p resection, benign  . Hypertension   . Varicella zoster 2007  . Osteoporosis     per pt report on Medical Screening Form    Family History  Problem Relation Age of Onset  . Heart disease Father    Past Surgical History  Procedure Laterality Date  . Total knee arthroplasty      left  . Vaginal hysterectomy      benign reasons  . Knee arthroscopy      right  . Cataract extraction Bilateral 2014    Dr. Sandra Cockayne  . Foot surgery     Social History   Social History  . Marital Status: Single    Spouse Name: N/A  . Number of Children: N/A  . Years of Education: N/A   Social History Main Topics  . Smoking status: Never Smoker   . Smokeless tobacco: Never Used  . Alcohol Use: No  . Drug Use: No  . Sexual Activity: Not Asked   Other Topics Concern  . None   Social History Narrative    Review of Systems  Constitutional: Negative for fever,  chills, appetite change, fatigue and unexpected weight change.  Eyes: Negative for visual disturbance.  Respiratory: Negative for cough, chest tightness and shortness of breath.   Cardiovascular: Negative for chest pain, palpitations and leg swelling.  Gastrointestinal: Negative for nausea, abdominal pain, diarrhea and constipation.  Musculoskeletal: Positive for myalgias and arthralgias.  Skin: Negative for color change and rash.  Hematological: Negative for adenopathy. Does not bruise/bleed easily.  Psychiatric/Behavioral: Negative for dysphoric mood. The patient is not nervous/anxious.        Objective:    BP 136/74 mmHg  Pulse 66  Ht 5\' 1"  (1.549 m)  Wt 196 lb (88.905 kg)  BMI 37.05 kg/m2  SpO2 96% Physical Exam  Constitutional: She is oriented to person, place, and time. She appears well-developed and well-nourished. No distress.  HENT:  Head: Normocephalic and atraumatic.  Right Ear: External ear normal.  Left Ear: External ear normal.  Nose: Nose normal.  Mouth/Throat: Oropharynx is clear and moist. No oropharyngeal exudate.  Eyes: Conjunctivae are normal. Pupils are equal, round, and reactive to light. Right eye exhibits no discharge. Left eye exhibits no discharge. No scleral icterus.  Neck: Normal range of motion. Neck supple. No tracheal deviation present. No thyromegaly present.  Cardiovascular: Normal rate, normal heart sounds and intact distal pulses.  An irregularly irregular rhythm present.  Extrasystoles are present. Exam reveals no gallop and no friction rub.   No murmur heard. Pulmonary/Chest: Effort normal and breath sounds normal. No respiratory distress. She has no wheezes. She has no rales. She exhibits no tenderness.  Musculoskeletal: Normal range of motion. She exhibits no edema or tenderness.  Lymphadenopathy:    She has no cervical adenopathy.  Neurological: She is alert and oriented to person, place, and time. No cranial nerve deficit. She exhibits  normal muscle tone. Coordination normal.  Skin: Skin is warm and dry. No rash noted. She is not diaphoretic. No erythema. No pallor.  Psychiatric: She has a normal mood and affect. Her behavior is normal. Judgment and thought content normal.          Assessment & Plan:   Problem List Items Addressed This Visit      Unprioritized   Atrial fibrillation ([place]) - Primary    New onset AFIB. Reviewed notes from hospitalization. EKG today shows afib with rate of 60. Rate well controlled on Metoprolol and anticoagulated with Xarelto. Follow up with cardiology pending for 6/16.  Follow up here in 3 months.      Relevant Orders   EKG 12-Lead (Completed)       No Follow-up on file.  Ronette Deter, MD [facility]

## 2016-06-02 ENCOUNTER — Encounter: Payer: Self-pay | Admitting: Podiatry

## 2016-06-02 ENCOUNTER — Ambulatory Visit (INDEPENDENT_AMBULATORY_CARE_PROVIDER_SITE_OTHER): Payer: Medicare Other | Admitting: Podiatry

## 2016-06-02 DIAGNOSIS — B351 Tinea unguium: Secondary | ICD-10-CM

## 2016-06-02 DIAGNOSIS — Q828 Other specified congenital malformations of skin: Secondary | ICD-10-CM

## 2016-06-02 DIAGNOSIS — M79676 Pain in unspecified toe(s): Secondary | ICD-10-CM | POA: Diagnosis not present

## 2016-06-02 NOTE — Progress Notes (Signed)
She presents today for follow-up of her painful elongated toenails and calluses.  Objective: No signs are stable she is alert and oriented 3. No erythema edema cellulitis drainage or odor her toenails are thick yellow dystrophic onychomycotic and painful on palpation she has multiple reactive hyperkeratosis plantar aspect of bilateral foot.  Assessment: Pain limb secondary onychomycosis.  Plan: Debridement of toenails in all reactive hyperkeratosis bilateral. Follow up with her in 3 months.

## 2016-06-09 ENCOUNTER — Emergency Department: Payer: Medicare Other

## 2016-06-09 ENCOUNTER — Encounter: Payer: Self-pay | Admitting: *Deleted

## 2016-06-09 ENCOUNTER — Emergency Department
Admission: EM | Admit: 2016-06-09 | Discharge: 2016-06-09 | Disposition: A | Discharge status: Home / Self Care | Payer: Medicare Other | Attending: Emergency Medicine | Admitting: Emergency Medicine

## 2016-06-09 DIAGNOSIS — Z79899 Other long term (current) drug therapy: Secondary | ICD-10-CM | POA: Insufficient documentation

## 2016-06-09 DIAGNOSIS — I4891 Unspecified atrial fibrillation: Secondary | ICD-10-CM | POA: Insufficient documentation

## 2016-06-09 DIAGNOSIS — Z792 Long term (current) use of antibiotics: Secondary | ICD-10-CM | POA: Diagnosis not present

## 2016-06-09 DIAGNOSIS — E785 Hyperlipidemia, unspecified: Secondary | ICD-10-CM | POA: Diagnosis not present

## 2016-06-09 DIAGNOSIS — R0602 Shortness of breath: Secondary | ICD-10-CM | POA: Diagnosis not present

## 2016-06-09 DIAGNOSIS — Z7982 Long term (current) use of aspirin: Secondary | ICD-10-CM | POA: Diagnosis not present

## 2016-06-09 DIAGNOSIS — M810 Age-related osteoporosis without current pathological fracture: Secondary | ICD-10-CM | POA: Diagnosis not present

## 2016-06-09 DIAGNOSIS — R9431 Abnormal electrocardiogram [ECG] [EKG]: Secondary | ICD-10-CM | POA: Diagnosis not present

## 2016-06-09 DIAGNOSIS — M1990 Unspecified osteoarthritis, unspecified site: Secondary | ICD-10-CM | POA: Diagnosis not present

## 2016-06-09 DIAGNOSIS — I10 Essential (primary) hypertension: Secondary | ICD-10-CM | POA: Insufficient documentation

## 2016-06-09 LAB — BASIC METABOLIC PANEL
Anion gap: 10 (ref 5–15)
BUN: 13 mg/dL (ref 6–20)
CALCIUM: 9 mg/dL (ref 8.9–10.3)
CO2: 28 mmol/L (ref 22–32)
CREATININE: 0.47 mg/dL (ref 0.44–1.00)
Chloride: 103 mmol/L (ref 101–111)
GFR calc Af Amer: >60 mL/min (ref >60)
GFR calc non Af Amer: >60 mL/min (ref >60)
GLUCOSE: 161 mg/dL — AB (ref 65–99)
Potassium: 3 mmol/L — ABNORMAL LOW (ref 3.5–5.1)
Sodium: 141 mmol/L (ref 135–145)

## 2016-06-09 LAB — CBC WITH DIFFERENTIAL/PLATELET
Basophils Absolute: 0 10*3/uL (ref 0–0.1)
Basophils Relative: 1 %
EOS PCT: 2 %
Eosinophils Absolute: 0.2 10*3/uL (ref 0–0.7)
HEMATOCRIT: 36.5 % (ref 35.0–47.0)
Hemoglobin: 12.3 g/dL (ref 12.0–16.0)
LYMPHS PCT: 10 %
Lymphs Abs: 1 10*3/uL (ref 1.0–3.6)
MCH: 27.6 pg (ref 26.0–34.0)
MCHC: 33.8 g/dL (ref 32.0–36.0)
MCV: 81.5 fL (ref 80.0–100.0)
MONO ABS: 0.6 10*3/uL (ref 0.2–0.9)
MONOS PCT: 6 %
NEUTROS ABS: 7.5 10*3/uL — AB (ref 1.4–6.5)
Neutrophils Relative %: 81 %
Platelets: 208 10*3/uL (ref 150–440)
RBC: 4.48 MIL/uL (ref 3.80–5.20)
RDW: 14.6 % — AB (ref 11.5–14.5)
WBC: 9.2 10*3/uL (ref 3.6–11.0)

## 2016-06-09 LAB — BRAIN NATRIURETIC PEPTIDE: B Natriuretic Peptide: 251 pg/mL — ABNORMAL HIGH (ref 0.0–100.0)

## 2016-06-09 LAB — TROPONIN I: Troponin I: <0.03 ng/mL (ref <0.031)

## 2016-06-09 MED ORDER — FUROSEMIDE 10 MG/ML IJ SOLN
40.0000 mg | Freq: Once | INTRAMUSCULAR | Status: AC
  Administered 2016-06-09: 40 mg via INTRAVENOUS
  Filled 2016-06-09: qty 4

## 2016-06-09 MED ORDER — FUROSEMIDE 40 MG PO TABS: 40.0000 mg | ORAL_TABLET | Freq: Two times a day (BID) | ORAL | Status: DC

## 2016-06-09 MED ORDER — ACETAMINOPHEN 325 MG PO TABS
ORAL_TABLET | ORAL | Status: AC
  Administered 2016-06-09: 650 mg via ORAL
  Filled 2016-06-09: qty 2

## 2016-06-09 MED ORDER — ACETAMINOPHEN 325 MG PO TABS
650.0000 mg | ORAL_TABLET | Freq: Once | ORAL | Status: AC
  Administered 2016-06-09: 650 mg via ORAL

## 2016-06-09 NOTE — ED Notes (Signed)
REDS VEST READING= 36 CHEST RULER=10.5 VEST FITTING TASKS: POSTURE=sitting HEIGHT MARKER=S CENTER STRIP=A  COMMENTS:approved by [employer]

## 2016-06-09 NOTE — ED Notes (Signed)
Pt reports waking up this morning shortness of breath, pt denies any other symptoms

## 2016-06-09 NOTE — Discharge Instructions (Signed)
Please seek medical attention for any high fevers, chest pain, shortness of breath, change in behavior, persistent vomiting, bloody stool or any other new or concerning symptoms. ° ° °Shortness of Breath °Shortness of breath means you have trouble breathing. Shortness of breath needs medical care right away. °HOME CARE  °· Do not smoke. °· Avoid being around chemicals or things (paint fumes, dust) that may bother your breathing. °· Rest as needed. Slowly begin your normal activities. °· Only take medicines as told by your doctor. °· Keep all doctor visits as told. °GET HELP RIGHT AWAY IF:  °· Your shortness of breath gets worse. °· You feel lightheaded, pass out (faint), or have a cough that is not helped by medicine. °· You cough up blood. °· You have pain with breathing. °· You have pain in your chest, arms, shoulders, or belly (abdomen). °· You have a fever. °· You cannot walk up stairs or exercise the way you normally do. °· You do not get better in the time expected. °· You have a hard time doing normal activities even with rest. °· You have problems with your medicines. °· You have any new symptoms. °MAKE SURE YOU: °· Understand these instructions. °· Will watch your condition. °· Will get help right away if you are not doing well or get worse. °  °This information is not intended to replace advice given to you by your health care provider. Make sure you discuss any questions you have with your health care provider. °  °Document Released: 05/31/2008 Document Revised: 12/18/2013 Document Reviewed: 02/28/2012 °Elsevier Interactive Patient Education ©2016 Elsevier Inc. ° °

## 2016-06-09 NOTE — ED Provider Notes (Signed)
[facility] Emergency Department Provider Note   ____________________________________________  Time seen: ~0910  I have reviewed the triage vital signs and the nursing notes.   HISTORY  Chief Complaint Shortness of Breath   History limited by: Not Limited   HPI Tina Howard is a 82 y.o. female who presents to the emergency department today because of concerns for shortness of breath. It is breath got worse this morning however is been coming off the past couple of days. The shortness of breath is worse with exertion. Patient states yesterday she did notice some swelling around her ankles as well. She did have to sit up more in bed last night to go to bed. Patient is currently not on any diuretics. She denies any chest pain. No fevers.   Past Medical History  Diagnosis Date  . Allergic rhinitis   . Hypercholesteremia   . History of pelvic mass     s/p resection, benign  . Hypertension   . Varicella zoster 2007  . Osteoporosis     per pt report on Medical Screening Form     Patient Active Problem List   Diagnosis Date Noted  . Atrial fibrillation ([place]) 05/26/2016  . HTN (hypertension) 05/26/2016  . Dyslipidemia 05/26/2016  . Hypokalemia 05/26/2016  . Seasonal allergies 05/06/2016  . Obesity (BMI 30-39.9) 11/13/2014  . Back pain, chronic 05/08/2014  . Elevated blood sugar 05/08/2014  . Medicare annual wellness visit, subsequent 06/27/2012  . Hypertension 08/20/2011  . Hyperlipidemia 08/20/2011  . Osteoarthritis 08/20/2011    Past Surgical History  Procedure Laterality Date  . Total knee arthroplasty      left  . Vaginal hysterectomy      benign reasons  . Knee arthroscopy      right  . Cataract extraction Bilateral 2014    Dr. Sandra Cockayne  . Foot surgery      Current Outpatient Rx  Name  Route  Sig  Dispense  Refill  . aspirin 81 MG tablet   Oral   Take 81 mg by mouth daily.           Mariane Baumgarten Calcium (STOOL SOFTENER  PO)   Oral   Take 1 capsule by mouth 2 (two) times daily as needed. Take by mouth as directed         . losartan (COZAAR) 50 MG tablet   Oral   Take 1 tablet (50 mg total) by mouth daily.   90 tablet   3   . metoprolol tartrate (LOPRESSOR) 25 MG tablet   Oral   Take 1 tablet (25 mg total) by mouth 2 (two) times daily.   30 tablet   0   . rivaroxaban (XARELTO) 20 MG TABS tablet   Oral   Take 1 tablet (20 mg total) by mouth daily with supper.   30 tablet   0   . simvastatin (ZOCOR) 20 MG tablet      Take 1 tablet by mouth  daily   90 tablet   3   . timolol (TIMOPTIC) 0.5 % ophthalmic solution   Both Eyes   Place 1 drop into both eyes daily.            Allergies Erythromycin  Family History  Problem Relation Age of Onset  . Heart disease Father     Social History Social History  Substance Use Topics  . Smoking status: Never Smoker   . Smokeless tobacco: Never Used  . Alcohol Use: No  Review of Systems  Constitutional: Negative for fever. Cardiovascular: Negative for chest pain. Respiratory: Positive for shortness of breath. Gastrointestinal: Negative for abdominal pain, vomiting and diarrhea. Neurological: Negative for headaches, focal weakness or numbness.   10-point ROS otherwise negative.  ____________________________________________   PHYSICAL EXAM:  VITAL SIGNS: ED Triage Vitals  Enc Vitals Group     BP 06/09/16 0752 219/98 mmHg     Pulse Rate 06/09/16 0752 93     Resp 06/09/16 0752 20     Temp 06/09/16 0752 97.9 F (36.6 C)     Temp Source 06/09/16 0752 Oral     SpO2 06/09/16 0752 95 %     Weight 06/09/16 0752 200 lb (90.719 kg)     Height 06/09/16 0752 5\' 1"  (1.549 m)   Constitutional: Alert and oriented. Increased respiratory effort.  Eyes: Conjunctivae are normal. PERRL. Normal extraocular movements. ENT   Head: Normocephalic and atraumatic.   Nose: No congestion/rhinnorhea.   Mouth/Throat: Mucous membranes are  moist.   Neck: No stridor. Hematological/Lymphatic/Immunilogical: No cervical lymphadenopathy. Cardiovascular: Normal rate, regular rhythm.  No murmurs, rubs, or gallops. Respiratory: Increased respiratory effort. Crackles in bilateral bases.  Gastrointestinal: Soft and nontender. No distention. There is no CVA tenderness. Genitourinary: Deferred Musculoskeletal: Normal range of motion in all extremities. No joint effusions.  No lower extremity tenderness nor edema. Neurologic:  Normal speech and language. No gross focal neurologic deficits are appreciated.  Skin:  Skin is warm, dry and intact. No rash noted. Psychiatric: Mood and affect are normal. Speech and behavior are normal. Patient exhibits appropriate insight and judgment.  ____________________________________________    LABS (pertinent positives/negatives)  Labs Reviewed  CBC WITH DIFFERENTIAL/PLATELET - Abnormal; Notable for the following:    RDW 14.6 (*)    Neutro Abs 7.5 (*)    All other components within normal limits  BASIC METABOLIC PANEL - Abnormal; Notable for the following:    Potassium 3.0 (*)    Glucose, Bld 161 (*)    All other components within normal limits  BRAIN NATRIURETIC PEPTIDE - Abnormal; Notable for the following:    B Natriuretic Peptide 251.0 (*)    All other components within normal limits  TROPONIN I     ____________________________________________   EKG  I, Nance Pear, attending physician, personally viewed and interpreted this EKG  EKG Time: 0741 Rate: 92 Rhythm: atrial fibrillation Axis: normal Intervals: qtc 472 QRS: narrow ST changes: no st elevation Impression: abnormal ekg   ____________________________________________    RADIOLOGY  CXR IMPRESSION: Congestive heart failure. No airspace consolidation.   ____________________________________________   PROCEDURES  Procedure(s) performed: None  Critical Care performed:  No  ____________________________________________   INITIAL IMPRESSION / ASSESSMENT AND PLAN / ED COURSE  Pertinent labs & imaging results that were available during my care of the patient were reviewed by me and considered in my medical decision making (see chart for details).  Patient presented to the emergency department today because of concerns for shortness of breath. The patient did have some crackles in the bases of her lungs. Additionally chest x-ray did show some pulmonary edema patient had a mildly elevated BNP. Patient additionally had some pitting in the lower extremities consistent with heart failure exacerbation. Patient states that at one point she was on diuretics however has not been on diuretics quite some time. Patient was given IV Lasix here and observed for a number of hours. She did have frequent urination and felt better. He did get the patient up and  walked her and she stated she felt much improved from when she came in. Her oxygen dropped as low as 90 however no further desaturations. I did have a discussion with the patient and offered admission for CHF exacerbation. However I also stated that I thought it was reasonable to trial her at home. She does have an appointment already scheduled for cardiology tomorrow morning. Comfortable going home at this point. States she lives close to the hospital. Will discharge home with Lasix and instruct patient to take another dose this evening. Patient will follow up with cardiology tomorrow. Additionally while patient follow-up with congestive heart failure clinic.  ____________________________________________   FINAL CLINICAL IMPRESSION(S) / ED DIAGNOSES  Final diagnoses:  Shortness of breath     Note: This dictation was prepared with Dragon dictation. Any transcriptional errors that result from this process are unintentional    Nance Pear, MD 06/09/16 1446

## 2016-06-09 NOTE — ED Notes (Signed)
Pt walked around ED at a slow pace, with SpO2 of 90-94%.  Pt states "my breathing feels better, but I still feel slightly short of breath and tight".

## 2016-06-10 DIAGNOSIS — I4891 Unspecified atrial fibrillation: Secondary | ICD-10-CM | POA: Diagnosis not present

## 2016-06-15 ENCOUNTER — Encounter: Payer: Self-pay | Admitting: *Deleted

## 2016-06-15 ENCOUNTER — Encounter
Admission: RE | Disposition: A | Discharge status: Home / Self Care | Payer: Self-pay | Source: Ambulatory Visit | Attending: Internal Medicine

## 2016-06-15 ENCOUNTER — Ambulatory Visit
Admission: RE | Admit: 2016-06-15 | Discharge: 2016-06-15 | Disposition: A | Discharge status: Home / Self Care | Payer: Medicare Other | Source: Ambulatory Visit | Attending: Internal Medicine | Admitting: Internal Medicine

## 2016-06-15 ENCOUNTER — Ambulatory Visit: Payer: Medicare Other | Admitting: Anesthesiology

## 2016-06-15 DIAGNOSIS — M810 Age-related osteoporosis without current pathological fracture: Secondary | ICD-10-CM | POA: Diagnosis not present

## 2016-06-15 DIAGNOSIS — I4891 Unspecified atrial fibrillation: Secondary | ICD-10-CM | POA: Insufficient documentation

## 2016-06-15 DIAGNOSIS — I10 Essential (primary) hypertension: Secondary | ICD-10-CM | POA: Diagnosis not present

## 2016-06-15 DIAGNOSIS — E7800 Pure hypercholesterolemia, unspecified: Secondary | ICD-10-CM | POA: Diagnosis not present

## 2016-06-15 DIAGNOSIS — I480 Paroxysmal atrial fibrillation: Secondary | ICD-10-CM | POA: Diagnosis not present

## 2016-06-15 DIAGNOSIS — K219 Gastro-esophageal reflux disease without esophagitis: Secondary | ICD-10-CM | POA: Insufficient documentation

## 2016-06-15 DIAGNOSIS — R0602 Shortness of breath: Secondary | ICD-10-CM | POA: Diagnosis not present

## 2016-06-15 HISTORY — PX: ELECTROPHYSIOLOGIC STUDY: SHX172A

## 2016-06-15 HISTORY — DX: Reserved for inherently not codable concepts without codable children: IMO0001

## 2016-06-15 SURGERY — CARDIOVERSION (CATH LAB)
Anesthesia: General

## 2016-06-15 MED ORDER — PROPOFOL 10 MG/ML IV BOLUS
INTRAVENOUS | Status: DC | PRN
  Administered 2016-06-15: 50 mg via INTRAVENOUS

## 2016-06-15 MED ORDER — FLECAINIDE ACETATE 50 MG PO TABS: 50.0000 mg | ORAL_TABLET | Freq: Two times a day (BID) | ORAL | Status: DC

## 2016-06-15 NOTE — Transfer of Care (Signed)
Immediate Anesthesia Transfer of Care Note  Patient: Tina Howard  Procedure(s) Performed: Procedure(s): CARDIOVERSION (N/A)  Patient Location: Cath Lab  Anesthesia Type:General  Level of Consciousness: awake, alert  and oriented  Airway & Oxygen Therapy: Patient Spontanous Breathing and Patient connected to nasal cannula oxygen  Post-op Assessment: Report given to RN and Post -op Vital signs reviewed and stable  Post vital signs: Reviewed and stable  Last Vitals: There were no vitals filed for this visit.  Last Pain: There were no vitals filed for this visit.       Complications: No apparent anesthesia complications

## 2016-06-15 NOTE — CV Procedure (Signed)
Electrical Cardioversion Procedure Note Tina Howard [id number] 16-Aug-1933  Procedure: Electrical Cardioversion Indications:  Atrial Fibrillation  Procedure Details Consent: Risks of procedure as well as the alternatives and risks of each were explained to the (patient/caregiver).  Consent for procedure obtained. Time Out: Verified patient identification, verified procedure, site/side was marked, verified correct patient position, special equipment/implants available, medications/allergies/relevent history reviewed, required imaging and test results available.  Performed  Patient placed on cardiac monitor, pulse oximetry, supplemental oxygen as necessary.  Sedation given: Benzodiazepines and Short-acting barbiturates Pacer pads placed anterior and posterior chest.  Cardioverted 1 time(s).  Cardioverted at 120J.  Evaluation Findings: Post procedure EKG shows: NSR Complications: None Patient did tolerate procedure well.   Tina Howard 06/15/2016, 7:48 AM

## 2016-06-15 NOTE — Anesthesia Postprocedure Evaluation (Signed)
Anesthesia Post Note  Patient: KARYNN ZAJIC  Procedure(s) Performed: Procedure(s) (LRB): CARDIOVERSION (N/A)  Patient location during evaluation: Cath Lab Anesthesia Type: General Level of consciousness: awake and alert Pain management: pain level controlled Vital Signs Assessment: post-procedure vital signs reviewed and stable Respiratory status: spontaneous breathing, nonlabored ventilation, respiratory function stable and patient connected to nasal cannula oxygen Cardiovascular status: blood pressure returned to baseline and stable Postop Assessment: no signs of nausea or vomiting Anesthetic complications: no    Last Vitals:  Filed Vitals:   06/15/16 0830 06/15/16 0835  BP: 111/60   Pulse: 61 64  Resp: 14 15    Last Pain: There were no vitals filed for this visit.               Martha Clan

## 2016-06-15 NOTE — Anesthesia Preprocedure Evaluation (Signed)
Anesthesia Evaluation  Patient identified by MRN, date of birth, ID band Patient awake    Reviewed: Allergy & Precautions, H&P , NPO status , Patient's Chart, lab work & pertinent test results, reviewed documented beta blocker date and time   History of Anesthesia Complications (+) PONV and history of anesthetic complications  Airway Mallampati: II  TM Distance: >3 FB Neck ROM: full    Dental no notable dental hx. (+) Teeth Intact   Pulmonary shortness of breath (with atrial fibrillation), neg sleep apnea, neg COPD, neg recent URI,    Pulmonary exam normal breath sounds clear to auscultation       Cardiovascular Exercise Tolerance: Good hypertension, (-) angina(-) CAD, (-) Past MI, (-) Cardiac Stents and (-) CABG Normal cardiovascular exam(-) dysrhythmias Atrial Fibrillation + Valvular Problems/Murmurs  Rhythm:regular Rate:Normal     Neuro/Psych negative neurological ROS  negative psych ROS   GI/Hepatic Neg liver ROS, GERD  Controlled,  Endo/Other  negative endocrine ROS  Renal/GU negative Renal ROS  negative genitourinary   Musculoskeletal   Abdominal   Peds  Hematology negative hematology ROS (+)   Anesthesia Other Findings Past Medical History:   Allergic rhinitis                                            Hypercholesteremia                                           History of pelvic mass                                         Comment:s/p resection, benign   Hypertension                                                 Varicella zoster                                2007         Osteoporosis                                                   Comment:per pt report on Medical Screening Form    Shortness of breath dyspnea                                  Reproductive/Obstetrics negative OB ROS                             Anesthesia Physical Anesthesia Plan  ASA: II  Anesthesia Plan:  General   Post-op Pain Management:    Induction:   Airway Management Planned:   Additional Equipment:   Intra-op Plan:   Post-operative Plan:   Informed Consent:  I have reviewed the patients History and Physical, chart, labs and discussed the procedure including the risks, benefits and alternatives for the proposed anesthesia with the patient or authorized representative who has indicated his/her understanding and acceptance.   Dental Advisory Given  Plan Discussed with: Anesthesiologist, CRNA and Surgeon  Anesthesia Plan Comments:         Anesthesia Quick Evaluation

## 2016-06-15 NOTE — Discharge Instructions (Signed)
Electrical Cardioversion, Care After °Refer to this sheet in the next few weeks. These instructions provide you with information on caring for yourself after your procedure. Your health care provider may also give you more specific instructions. Your treatment has been planned according to current medical practices, but problems sometimes occur. Call your health care provider if you have any problems or questions after your procedure. °WHAT TO EXPECT AFTER THE PROCEDURE °After your procedure, it is typical to have the following sensations: °· Some redness on the skin where the shocks were delivered. If this is tender, a sunburn lotion or hydrocortisone cream may help. °· Possible return of an abnormal heart rhythm within hours or days after the procedure. °HOME CARE INSTRUCTIONS °· Take medicines only as directed by your health care provider. Be sure you understand how and when to take your medicine. °· Learn how to feel your pulse and check it often. °· Limit your activity for 48 hours after the procedure or as directed by your health care provider. °· Avoid or minimize caffeine and other stimulants as directed by your health care provider. °SEEK MEDICAL CARE IF: °· You feel like your heart is beating too fast or your pulse is not regular. °· You have any questions about your medicines. °· You have bleeding that will not stop. °SEEK IMMEDIATE MEDICAL CARE IF: °· You are dizzy or feel faint. °· It is hard to breathe or you feel short of breath. °· There is a change in discomfort in your chest. °· Your speech is slurred or you have trouble moving an arm or leg on one side of your body. °· You get a serious muscle cramp that does not go away. °· Your fingers or toes turn cold or blue. °  °This information is not intended to replace advice given to you by your health care provider. Make sure you discuss any questions you have with your health care provider. °  °Document Released: 10/03/2013 Document Revised: 01/03/2015  Document Reviewed: 10/03/2013 °Elsevier Interactive Patient Education ©2016 [employer] ° °

## 2016-06-24 DIAGNOSIS — I4891 Unspecified atrial fibrillation: Secondary | ICD-10-CM | POA: Diagnosis not present

## 2016-06-24 DIAGNOSIS — I480 Paroxysmal atrial fibrillation: Secondary | ICD-10-CM | POA: Diagnosis not present

## 2016-07-26 DIAGNOSIS — I10 Essential (primary) hypertension: Secondary | ICD-10-CM | POA: Diagnosis not present

## 2016-07-26 DIAGNOSIS — I480 Paroxysmal atrial fibrillation: Secondary | ICD-10-CM | POA: Diagnosis not present

## 2016-08-04 ENCOUNTER — Ambulatory Visit (INDEPENDENT_AMBULATORY_CARE_PROVIDER_SITE_OTHER): Payer: Medicare Other | Admitting: Podiatry

## 2016-08-04 ENCOUNTER — Encounter: Payer: Self-pay | Admitting: Podiatry

## 2016-08-04 DIAGNOSIS — B351 Tinea unguium: Secondary | ICD-10-CM

## 2016-08-04 DIAGNOSIS — M79676 Pain in unspecified toe(s): Secondary | ICD-10-CM | POA: Diagnosis not present

## 2016-08-04 DIAGNOSIS — Q828 Other specified congenital malformations of skin: Secondary | ICD-10-CM | POA: Diagnosis not present

## 2016-08-04 NOTE — Progress Notes (Signed)
She presents today chief complaint of painful elongated toenails.  Objective: Pulses remain palpable. Toenails are thick yellow dystrophic onychomycotic.  Assessment: Pain limb secondary onychomycosis.  Plan: Debridement of toenails 1 through 5 bilateral.

## 2016-08-06 ENCOUNTER — Ambulatory Visit (INDEPENDENT_AMBULATORY_CARE_PROVIDER_SITE_OTHER): Payer: Medicare Other | Admitting: Family Medicine

## 2016-08-06 ENCOUNTER — Encounter: Payer: Self-pay | Admitting: Family Medicine

## 2016-08-06 ENCOUNTER — Ambulatory Visit: Payer: Medicare Other | Admitting: Internal Medicine

## 2016-08-06 DIAGNOSIS — I4891 Unspecified atrial fibrillation: Secondary | ICD-10-CM | POA: Diagnosis not present

## 2016-08-06 DIAGNOSIS — I10 Essential (primary) hypertension: Secondary | ICD-10-CM

## 2016-08-06 DIAGNOSIS — E785 Hyperlipidemia, unspecified: Secondary | ICD-10-CM | POA: Diagnosis not present

## 2016-08-06 NOTE — Progress Notes (Signed)
Subjective:  Patient ID: Tina Howard, female    DOB: April 03, 1933  Age: 82 y.o. MRN: [id number]  CC: Follow up  HPI:  82 year old female with a past medical history of hypertension, hyperlipidemia, prediabetes, A. fib presents for follow-up.  Atrial fibrillation  Stable at this time.  Is being managed by cardiology.  Tolerating diltiazem, flecainide, lasix and Eliquis.  HTN  Has been well controlled/at goal.  Compliant with losartan, dilt, lasix.  HLD  Stable.  No meds at this time.  Social Hx   Social History   Social History  . Marital status: Single    Spouse name: N/A  . Number of children: N/A  . Years of education: N/A   Social History Main Topics  . Smoking status: Never Smoker  . Smokeless tobacco: Never Used  . Alcohol use No  . Drug use: No  . Sexual activity: Not Asked   Other Topics Concern  . None   Social History Narrative  . None   Review of Systems  Constitutional: Negative.   Respiratory: Negative for shortness of breath.   Cardiovascular: Negative for chest pain.   Objective:  BP (!) 148/80 (BP Location: Right Arm, Patient Position: Sitting, Cuff Size: Large)   Pulse 64   Temp 98 F (36.7 C) (Oral)   Wt 195 lb 2 oz (88.5 kg)   SpO2 97%   BMI 36.87 kg/m   BP/Weight 08/06/2016 06/15/2016 [id number]  Systolic BP [id number]  Diastolic BP 80 63 60  Wt. (Lbs) 195.13 - -  BMI 36.87 - -   Physical Exam  Constitutional: She is oriented to person, place, and time. She appears well-developed. No distress.  Cardiovascular: Normal rate and regular rhythm.   2/6 systolic murmur.   Pulmonary/Chest: Effort normal. She has no wheezes. She has no rales.  Neurological: She is alert and oriented to person, place, and time.  Psychiatric: She has a normal mood and affect.  Vitals reviewed.  Lab Results  Component Value Date   WBC 9.2 06/09/2016   HGB 12.3 06/09/2016   HCT 36.5 06/09/2016   PLT 208 06/09/2016   GLUCOSE 161 (H)  06/09/2016   CHOL 186 04/01/2016   TRIG 147.0 04/01/2016   HDL 62.20 04/01/2016   LDLDIRECT 101.5 06/27/2012   LDLCALC 95 04/01/2016   ALT 14 04/01/2016   AST 17 04/01/2016   NA 141 06/09/2016   K 3.0 (L) 06/09/2016   CL 103 06/09/2016   CREATININE 0.47 06/09/2016   BUN 13 06/09/2016   CO2 28 06/09/2016   TSH 2.713 05/26/2016   INR 1.0 03/21/2013   HGBA1C 6.0 04/01/2016   MICROALBUR 1.1 04/01/2016    Assessment & Plan:   Problem List Items Addressed This Visit    Atrial fibrillation ([place])    Stable and doing well at this time. No evidence of volume overload on exam. Patient is to continue Dilt, Flecainide, Lasix, Eliquis.      Hyperlipidemia    Stable.  Could consider statin given comorbidities but unclear benefit at her age. Will continue to monitor.      Hypertension    At goal. Continue Losartan, Diltiazem, Lasix.       Other Visit Diagnoses   None.    Meds ordered this encounter  Medications  . Acetaminophen (TYLENOL 8 HOUR PO)    Sig: Take by mouth as needed.  . AMOXICILLIN PO    Sig: Take 125 mg by mouth. Take four tablets before dentist  appointment.   Follow-up: 6 months.  [place]

## 2016-08-06 NOTE — Assessment & Plan Note (Addendum)
Stable and doing well at this time. No evidence of volume overload on exam. Patient is to continue Dilt, Flecainide, Lasix, Eliquis.

## 2016-08-06 NOTE — Assessment & Plan Note (Signed)
Stable.  Could consider statin given comorbidities but unclear benefit at her age. Will continue to monitor.

## 2016-08-06 NOTE — Assessment & Plan Note (Signed)
At goal. Continue Losartan, Diltiazem, Lasix.

## 2016-08-06 NOTE — Patient Instructions (Signed)
You're doing well.  Continue your current medication.  Keep an eye on your BP. Goal is <150/90.  Follow up in 6 months.  Take care  Dr. Lacinda Axon

## 2016-08-06 NOTE — Progress Notes (Signed)
Pre visit review using our clinic review tool, if applicable. No additional management support is needed unless otherwise documented below in the visit note. 

## 2016-10-04 ENCOUNTER — Ambulatory Visit (INDEPENDENT_AMBULATORY_CARE_PROVIDER_SITE_OTHER): Payer: Medicare Other | Admitting: Podiatry

## 2016-10-04 ENCOUNTER — Encounter: Payer: Self-pay | Admitting: Podiatry

## 2016-10-04 DIAGNOSIS — M79676 Pain in unspecified toe(s): Secondary | ICD-10-CM

## 2016-10-04 DIAGNOSIS — B351 Tinea unguium: Secondary | ICD-10-CM

## 2016-10-04 DIAGNOSIS — L84 Corns and callosities: Secondary | ICD-10-CM | POA: Diagnosis not present

## 2016-10-04 DIAGNOSIS — Q828 Other specified congenital malformations of skin: Secondary | ICD-10-CM

## 2016-10-05 NOTE — Progress Notes (Signed)
She presents today with chief complaint of painful corns and calluses as well as painful elongated toenails.  Objective: Pulses remain palpable bilateral. Her toenails are thick yellow dystrophic with mycotic and painful palpation. Multiple calluses plantar aspect bilateral foot. Multiple porokeratotic lesions. Noninfected.  Assessment: Pain limb secondary to onychomycosis 1 through 5 bilateral. Multiple porokeratotic lesions bilateral.  Plan: Debridement of all reactive hyperkeratotic tissue and debridement of toenails 1 through 5 bilateral.

## 2016-10-06 DIAGNOSIS — Z23 Encounter for immunization: Secondary | ICD-10-CM | POA: Diagnosis not present

## 2016-10-25 ENCOUNTER — Ambulatory Visit (INDEPENDENT_AMBULATORY_CARE_PROVIDER_SITE_OTHER): Payer: Medicare Other

## 2016-10-25 ENCOUNTER — Telehealth: Payer: Self-pay | Admitting: Family Medicine

## 2016-10-25 VITALS — BP 140/72 | HR 77 | Temp 98.2°F | Resp 14 | Ht 61.0 in | Wt 196.0 lb

## 2016-10-25 DIAGNOSIS — Z0000 Encounter for general adult medical examination without abnormal findings: Secondary | ICD-10-CM | POA: Diagnosis not present

## 2016-10-25 NOTE — Patient Instructions (Addendum)
[name] , Thank you for taking time to come for your Medicare Wellness Visit. I appreciate your ongoing commitment to your health goals. Please review the following plan we discussed and let me know if I can assist you in the future.  FOLLOW UP WITH DR. COOK AS NEEDED.   These are the goals we discussed: Goals    . Increase physical activity          Continue walking for exercise.  Add chair exercises 10 minutes daily, as demonstrated. Educational material provided.  Increase activity as tolerated.         This is a list of the screening recommended for you and due dates:  Health Maintenance  Topic Date Due  . Shingles Vaccine  10/25/2017*  . Tetanus Vaccine  06/27/2022  . Flu Shot  Completed  . DEXA scan (bone density measurement)  Completed  . Pneumonia vaccines  Completed  *Topic was postponed. The date shown is not the original due date.    Health Maintenance, Female Adopting a healthy lifestyle and getting preventive care can go a long way to promote health and wellness. Talk with your health care provider about what schedule of regular examinations is right for you. This is a good chance for you to check in with your provider about disease prevention and staying healthy. In between checkups, there are plenty of things you can do on your own. Experts have done a lot of research about which lifestyle changes and preventive measures are most likely to keep you healthy. Ask your health care provider for more information. WEIGHT AND DIET  Eat a healthy diet  Be sure to include plenty of vegetables, fruits, low-fat dairy products, and lean protein.  Do not eat a lot of foods high in solid fats, added sugars, or salt.  Get regular exercise. This is one of the most important things you can do for your health.  Most adults should exercise for at least 150 minutes each week. The exercise should increase your heart rate and make you sweat (moderate-intensity exercise).  Most  adults should also do strengthening exercises at least twice a week. This is in addition to the moderate-intensity exercise.  Maintain a healthy weight  Body mass index (BMI) is a measurement that can be used to identify possible weight problems. It estimates body fat based on height and weight. Your health care provider can help determine your BMI and help you achieve or maintain a healthy weight.  For females 74 years of age and older:   A BMI below 18.5 is considered underweight.  A BMI of 18.5 to 24.9 is normal.  A BMI of 25 to 29.9 is considered overweight.  A BMI of 30 and above is considered obese.  Watch levels of cholesterol and blood lipids  You should start having your blood tested for lipids and cholesterol at 80 years of age, then have this test every 5 years.  You may need to have your cholesterol levels checked more often if:  Your lipid or cholesterol levels are high.  You are older than 80 years of age.  You are at high risk for heart disease.  CANCER SCREENING   Lung Cancer  Lung cancer screening is recommended for adults 23-62 years old who are at high risk for lung cancer because of a history of smoking.  A yearly low-dose CT scan of the lungs is recommended for people who:  Currently smoke.  Have quit within the past 15  years.  Have at least a 30-pack-year history of smoking. A pack year is smoking an average of one pack of cigarettes a day for 1 year.  Yearly screening should continue until it has been 15 years since you quit.  Yearly screening should stop if you develop a health problem that would prevent you from having lung cancer treatment.  Breast Cancer  Practice breast self-awareness. This means understanding how your breasts normally appear and feel.  It also means doing regular breast self-exams. Let your health care provider know about any changes, no matter how small.  If you are in your 20s or 30s, you should have a clinical  breast exam (CBE) by a health care provider every 1-3 years as part of a regular health exam.  If you are 58 or older, have a CBE every year. Also consider having a breast X-ray (mammogram) every year.  If you have a family history of breast cancer, talk to your health care provider about genetic screening.  If you are at high risk for breast cancer, talk to your health care provider about having an MRI and a mammogram every year.  Breast cancer gene (BRCA) assessment is recommended for women who have family members with BRCA-related cancers. BRCA-related cancers include:  Breast.  Ovarian.  Tubal.  Peritoneal cancers.  Results of the assessment will determine the need for genetic counseling and BRCA1 and BRCA2 testing. Cervical Cancer Your health care provider may recommend that you be screened regularly for cancer of the pelvic organs (ovaries, uterus, and vagina). This screening involves a pelvic examination, including checking for microscopic changes to the surface of your cervix (Pap test). You may be encouraged to have this screening done every 3 years, beginning at age 42.  For women ages 30-65, health care providers may recommend pelvic exams and Pap testing every 3 years, or they may recommend the Pap and pelvic exam, combined with testing for human papilloma virus (HPV), every 5 years. Some types of HPV increase your risk of cervical cancer. Testing for HPV may also be done on women of any age with unclear Pap test results.  Other health care providers may not recommend any screening for nonpregnant women who are considered low risk for pelvic cancer and who do not have symptoms. Ask your health care provider if a screening pelvic exam is right for you.  If you have had past treatment for cervical cancer or a condition that could lead to cancer, you need Pap tests and screening for cancer for at least 20 years after your treatment. If Pap tests have been discontinued, your risk  factors (such as having a new sexual partner) need to be reassessed to determine if screening should resume. Some women have medical problems that increase the chance of getting cervical cancer. In these cases, your health care provider may recommend more frequent screening and Pap tests. Colorectal Cancer  This type of cancer can be detected and often prevented.  Routine colorectal cancer screening usually begins at 80 years of age and continues through 80 years of age.  Your health care provider may recommend screening at an earlier age if you have risk factors for colon cancer.  Your health care provider may also recommend using home test kits to check for hidden blood in the stool.  A small camera at the end of a tube can be used to examine your colon directly (sigmoidoscopy or colonoscopy). This is done to check for the earliest forms of colorectal  cancer.  Routine screening usually begins at age 63.  Direct examination of the colon should be repeated every 5-10 years through 80 years of age. However, you may need to be screened more often if early forms of precancerous polyps or small growths are found. Skin Cancer  Check your skin from head to toe regularly.  Tell your health care provider about any new moles or changes in moles, especially if there is a change in a mole's shape or color.  Also tell your health care provider if you have a mole that is larger than the size of a pencil eraser.  Always use sunscreen. Apply sunscreen liberally and repeatedly throughout the day.  Protect yourself by wearing long sleeves, pants, a wide-brimmed hat, and sunglasses whenever you are outside. HEART DISEASE, DIABETES, AND HIGH BLOOD PRESSURE   High blood pressure causes heart disease and increases the risk of stroke. High blood pressure is more likely to develop in:  People who have blood pressure in the high end of the normal range (130-139/85-89 mm Hg).  People who are overweight or  obese.  People who are African American.  If you are 67-40 years of age, have your blood pressure checked every 3-5 years. If you are 63 years of age or older, have your blood pressure checked every year. You should have your blood pressure measured twice--once when you are at a hospital or clinic, and once when you are not at a hospital or clinic. Record the average of the two measurements. To check your blood pressure when you are not at a hospital or clinic, you can use:  An automated blood pressure machine at a pharmacy.  A home blood pressure monitor.  If you are between 73 years and 56 years old, ask your health care provider if you should take aspirin to prevent strokes.  Have regular diabetes screenings. This involves taking a blood sample to check your fasting blood sugar level.  If you are at a normal weight and have a low risk for diabetes, have this test once every three years after 80 years of age.  If you are overweight and have a high risk for diabetes, consider being tested at a younger age or more often. PREVENTING INFECTION  Hepatitis B  If you have a higher risk for hepatitis B, you should be screened for this virus. You are considered at high risk for hepatitis B if:  You were born in a country where hepatitis B is common. Ask your health care provider which countries are considered high risk.  Your parents were born in a high-risk country, and you have not been immunized against hepatitis B (hepatitis B vaccine).  You have HIV or AIDS.  You use needles to inject street drugs.  You live with someone who has hepatitis B.  You have had sex with someone who has hepatitis B.  You get hemodialysis treatment.  You take certain medicines for conditions, including cancer, organ transplantation, and autoimmune conditions. Hepatitis C  Blood testing is recommended for:  Everyone born from [birth year] through 1963.  Anyone with known risk factors for hepatitis  C. Sexually transmitted infections (STIs)  You should be screened for sexually transmitted infections (STIs) including gonorrhea and chlamydia if:  You are sexually active and are younger than 80 years of age.  You are older than 80 years of age and your health care provider tells you that you are at risk for this type of infection.  Your sexual activity  has changed since you were last screened and you are at an increased risk for chlamydia or gonorrhea. Ask your health care provider if you are at risk.  If you do not have HIV, but are at risk, it may be recommended that you take a prescription medicine daily to prevent HIV infection. This is called pre-exposure prophylaxis (PrEP). You are considered at risk if:  You are sexually active and do not regularly use condoms or know the HIV status of your partner(s).  You take drugs by injection.  You are sexually active with a partner who has HIV. Talk with your health care provider about whether you are at high risk of being infected with HIV. If you choose to begin PrEP, you should first be tested for HIV. You should then be tested every 3 months for as long as you are taking PrEP.  PREGNANCY   If you are premenopausal and you may become pregnant, ask your health care provider about preconception counseling.  If you may become pregnant, take 400 to 800 micrograms (mcg) of folic acid every day.  If you want to prevent pregnancy, talk to your health care provider about birth control (contraception). OSTEOPOROSIS AND MENOPAUSE   Osteoporosis is a disease in which the bones lose minerals and strength with aging. This can result in serious bone fractures. Your risk for osteoporosis can be identified using a bone density scan.  If you are 83 years of age or older, or if you are at risk for osteoporosis and fractures, ask your health care provider if you should be screened.  Ask your health care provider whether you should take a calcium or  vitamin D supplement to lower your risk for osteoporosis.  Menopause may have certain physical symptoms and risks.  Hormone replacement therapy may reduce some of these symptoms and risks. Talk to your health care provider about whether hormone replacement therapy is right for you.  HOME CARE INSTRUCTIONS   Schedule regular health, dental, and eye exams.  Stay current with your immunizations.   Do not use any tobacco products including cigarettes, chewing tobacco, or electronic cigarettes.  If you are pregnant, do not drink alcohol.  If you are breastfeeding, limit how much and how often you drink alcohol.  Limit alcohol intake to no more than 1 drink per day for nonpregnant women. One drink equals 12 ounces of beer, 5 ounces of wine, or 1 ounces of hard liquor.  Do not use street drugs.  Do not share needles.  Ask your health care provider for help if you need support or information about quitting drugs.  Tell your health care provider if you often feel depressed.  Tell your health care provider if you have ever been abused or do not feel safe at home.   This information is not intended to replace advice given to you by your health care provider. Make sure you discuss any questions you have with your health care provider.   Document Released: 06/28/2011 Document Revised: 01/03/2015 Document Reviewed: 11/14/2013 Elsevier Interactive Patient Education [employer].

## 2016-10-25 NOTE — Progress Notes (Signed)
Subjective:   Tina Howard is a 82 y.o. female who presents for Medicare Annual (Subsequent) preventive examination.  Review of Systems:  No ROS.  Medicare Wellness Visit.  Cardiac Risk Factors include: advanced age (>47men, >62 women);hypertension;obesity (BMI >30kg/m2)     Objective:     Vitals: BP 140/72 (BP Location: Right Arm, Patient Position: Sitting, Cuff Size: Normal)   Pulse 77   Temp 98.2 F (36.8 C) (Oral)   Resp 14   Ht 5\' 1"  (1.549 m)   Wt 196 lb (88.9 kg)   SpO2 97%   BMI 37.03 kg/m   Body mass index is 37.03 kg/m.   Tobacco History  Smoking Status  . Never Smoker  Smokeless Tobacco  . Never Used     Counseling given: Not Answered   Past Medical History:  Diagnosis Date  . Allergic rhinitis   . History of pelvic mass    s/p resection, benign  . Hypercholesteremia   . Hypertension   . Osteoporosis    per pt report on Medical Screening Form   . Shortness of breath dyspnea   . Varicella zoster 2007   Past Surgical History:  Procedure Laterality Date  . CATARACT EXTRACTION Bilateral 2014   Dr. Sandra Cockayne  . ELECTROPHYSIOLOGIC STUDY N/A 06/15/2016   Procedure: CARDIOVERSION;  Surgeon: [name], MD;  Location: [facility] ORS;  Service: Cardiovascular;  Laterality: N/A;  . FOOT SURGERY    . KNEE ARTHROSCOPY     right  . TOTAL KNEE ARTHROPLASTY     left  . VAGINAL HYSTERECTOMY     benign reasons   Family History  Problem Relation Age of Onset  . Heart disease Father    History  Sexual Activity  . Sexual activity: No    Outpatient Encounter Prescriptions as of 10/25/2016  Medication Sig  . Acetaminophen (TYLENOL 8 HOUR PO) Take by mouth as needed.  . AMOXICILLIN PO Take 125 mg by mouth. Take four tablets before dentist appointment.  Marland Kitchen apixaban (ELIQUIS) 5 MG TABS tablet Take by mouth.  . Cholecalciferol (VITAMIN D) 2000 units CAPS Take 1 capsule by mouth daily.  Marland Kitchen diltiazem (TIAZAC) 120 MG 24 hr capsule Take by mouth.  .  flecainide (TAMBOCOR) 50 MG tablet Take 1 tablet (50 mg total) by mouth 2 (two) times daily.  . furosemide (LASIX) 40 MG tablet Take 1 tablet (40 mg total) by mouth 2 (two) times daily.  Marland Kitchen losartan (COZAAR) 50 MG tablet Take 1 tablet (50 mg total) by mouth daily.  . timolol (TIMOPTIC) 0.5 % ophthalmic solution Place 1 drop into both eyes every evening.    No facility-administered encounter medications on file as of 10/25/2016.     Activities of Daily Living In your present state of health, do you have any difficulty performing the following activities: 10/25/2016 05/26/2016  Hearing? Y N  Vision? N N  Difficulty concentrating or making decisions? N N  Walking or climbing stairs? Y N  Dressing or bathing? N N  Doing errands, shopping? N N  Preparing Food and eating ? N -  Using the Toilet? N -  In the past six months, have you accidently leaked urine? N -  Do you have problems with loss of bowel control? N -  Managing your Medications? N -  Managing your Finances? N -  Housekeeping or managing your Housekeeping? N -  Some recent data might be hidden    Patient Care Team: Coral Spikes, DO as  PCP - General (Family Medicine)    Assessment:    This is a routine wellness examination for Tina Howard. The goal of the wellness visit is to assist the patient how to close the gaps in care and create a preventative care plan for the patient.   Taking calcium VIT D as appropriate/Osteoporosis risk reviewed.  Medications reviewed; taking without issues or barriers.  Safety issues reviewed; lives with her son.  Son does smoke in home.  Smoke detectors in the home. No firearms in the home. Wears seatbelts when driving or riding with others. No violence in the home.  No identified risk were noted; The patient was oriented x 3; appropriate in dress and manner and no objective failures at ADL's or IADL's.   Body mass index; discussed the importance of a healthy diet, water intake and  exercise.Pre diabetes; she tries to have a healthy with adequate water intake and exercise.    ZOSTAVAX vaccine postponed per patient request.   Health maintenance gaps; closed.  Patient Concerns: None at this time. Follow up with PCP as needed.  Exercise Activities and Dietary recommendations Current Exercise Habits: Home exercise routine, Type of exercise: walking, Time (Minutes): 30, Frequency (Times/Week): 2, Weekly Exercise (Minutes/Week): 60, Intensity: Mild  Goals    . Increase physical activity          Continue walking for exercise.  Add chair exercises 10 minutes daily, as demonstrated. Educational material provided.  Increase activity as tolerated.        Fall Risk Fall Risk  10/25/2016 05/31/2016 11/13/2015 11/13/2014 10/30/2013  Falls in the past year? No No Yes No Yes  Number falls in past yr: - - 1 - 1  Injury with Fall? - - No - No   Depression Screen PHQ 2/9 Scores 10/25/2016 05/31/2016 11/13/2015 11/13/2014  PHQ - 2 Score 0 0 0 0     Cognitive Function MMSE - Mini Mental State Exam 10/25/2016  Orientation to time 5  Orientation to Place 5  Registration 3  Attention/ Calculation 5  Recall 3  Language- name 2 objects 2  Language- repeat 1  Language- follow 3 step command 3  Language- read & follow direction 1  Write a sentence 1  Copy design 1  Total score 30        Immunization History  Administered Date(s) Administered  . Influenza Split 09/24/2011, 10/16/2012  . Influenza,inj,Quad PF,36+ Mos 10/30/2013, 11/13/2014, 11/13/2015  . Influenza-Unspecified 10/06/2016  . Pneumococcal Conjugate-13 05/08/2014  . Pneumococcal Polysaccharide-23 09/24/2011  . Tdap 06/27/2012   Screening Tests Health Maintenance  Topic Date Due  . ZOSTAVAX  10/25/2017 (Originally 05/22/1995)  . TETANUS/TDAP  06/27/2022  . INFLUENZA VACCINE  Completed  . DEXA SCAN  Completed  . PNA vac Low Risk Adult  Completed      Plan:    End of life planning; Advance aging;  Advanced directives discussed. Copy of current HCPOA/Living Will requested.  Medicare Attestation I have personally reviewed: The patient's medical and social history Their use of alcohol, tobacco or illicit drugs Their current medications and supplements The patient's functional ability including ADLs,fall risks, home safety risks, cognitive, and hearing and visual impairment Diet and physical activities Evidence for depression   The patient's weight, height, BMI, and visual acuity have been recorded in the chart.  I have made referrals and provided education to the patient based on review of the above and I have provided the patient with a written personalized care plan for preventive  services.    During the course of the visit the patient was educated and counseled about the following appropriate screening and preventive services:   Vaccines to include Pneumoccal, Influenza, Hepatitis B, Td, Zostavax, HCV  Electrocardiogram  Cardiovascular Disease  Colorectal cancer screening  Bone density screening  Diabetes screening  Glaucoma screening  Mammography/PAP  Nutrition counseling   Patient Instructions (the written plan) was given to the patient.   OBrien-Blaney, Debera Sterba L, LPN  [id number]   Reviewed above.  Agree with plan.   Dr Nicki Reaper

## 2016-10-25 NOTE — Telephone Encounter (Signed)
I called pt and left a vm to call office to schedule AWV. Thank you!

## 2016-11-01 ENCOUNTER — Ambulatory Visit (INDEPENDENT_AMBULATORY_CARE_PROVIDER_SITE_OTHER): Payer: Medicare Other | Admitting: Podiatry

## 2016-11-01 ENCOUNTER — Ambulatory Visit (INDEPENDENT_AMBULATORY_CARE_PROVIDER_SITE_OTHER): Payer: Medicare Other

## 2016-11-01 DIAGNOSIS — M7752 Other enthesopathy of left foot: Secondary | ICD-10-CM | POA: Diagnosis not present

## 2016-11-01 DIAGNOSIS — M778 Other enthesopathies, not elsewhere classified: Secondary | ICD-10-CM

## 2016-11-01 DIAGNOSIS — M19072 Primary osteoarthritis, left ankle and foot: Secondary | ICD-10-CM

## 2016-11-01 DIAGNOSIS — M79672 Pain in left foot: Secondary | ICD-10-CM | POA: Diagnosis not present

## 2016-11-01 DIAGNOSIS — M779 Enthesopathy, unspecified: Secondary | ICD-10-CM

## 2016-11-01 NOTE — Progress Notes (Signed)
She presents today for pain across the top of her left foot. She states more than may have a stress fracture.  Objective: Vital signs are stable she is alert and oriented 3 no major changes from last evaluation. She still has considerable spurring across the dorsum of the foot and tenderness along Lisfranc's joints. Radiographs taken today 3 views of the left foot demonstrates severe osteoarthritis of the midfoot. No fractures are identified. Pain on palpation dorsal aspect of the foot.  Assessment: Osteoarthritis capsulitis dorsal aspect left foot.  Plan: I injected the area today with Kenalog and local anesthetic. Follow up with her as needed.

## 2016-11-09 DIAGNOSIS — H401132 Primary open-angle glaucoma, bilateral, moderate stage: Secondary | ICD-10-CM | POA: Diagnosis not present

## 2016-11-23 ENCOUNTER — Telehealth: Payer: Self-pay | Admitting: Family Medicine

## 2016-11-23 NOTE — Telephone Encounter (Signed)
Pt states not ready to resch AWV appt. Thank you!

## 2016-11-25 ENCOUNTER — Telehealth: Payer: Self-pay | Admitting: *Deleted

## 2016-11-25 NOTE — Telephone Encounter (Signed)
PT came in today wanting to become a new patient because she prefers a female provider. Please let me know if this is okay with you both so I can call the patient to get her scheduled. Thanks!

## 2016-11-25 NOTE — Telephone Encounter (Signed)
Fine with me. Thanks

## 2016-11-25 NOTE — Telephone Encounter (Signed)
Fine with me

## 2016-11-26 NOTE — Telephone Encounter (Signed)
TC to pt to schedule her a new patient appointment at [facility]. If pt calls please schedule her.

## 2016-11-29 DIAGNOSIS — R0782 Intercostal pain: Secondary | ICD-10-CM | POA: Diagnosis not present

## 2016-11-29 DIAGNOSIS — I10 Essential (primary) hypertension: Secondary | ICD-10-CM | POA: Diagnosis not present

## 2016-11-29 DIAGNOSIS — I480 Paroxysmal atrial fibrillation: Secondary | ICD-10-CM | POA: Diagnosis not present

## 2016-12-08 ENCOUNTER — Ambulatory Visit (INDEPENDENT_AMBULATORY_CARE_PROVIDER_SITE_OTHER): Payer: Medicare Other | Admitting: Primary Care

## 2016-12-08 ENCOUNTER — Encounter: Payer: Self-pay | Admitting: Primary Care

## 2016-12-08 DIAGNOSIS — M545 Low back pain: Secondary | ICD-10-CM

## 2016-12-08 DIAGNOSIS — M19011 Primary osteoarthritis, right shoulder: Secondary | ICD-10-CM

## 2016-12-08 DIAGNOSIS — I480 Paroxysmal atrial fibrillation: Secondary | ICD-10-CM

## 2016-12-08 DIAGNOSIS — M19012 Primary osteoarthritis, left shoulder: Secondary | ICD-10-CM

## 2016-12-08 DIAGNOSIS — I10 Essential (primary) hypertension: Secondary | ICD-10-CM | POA: Diagnosis not present

## 2016-12-08 DIAGNOSIS — G8929 Other chronic pain: Secondary | ICD-10-CM

## 2016-12-08 NOTE — Patient Instructions (Addendum)
Follow up in October 2018 for your annual wellness visit.  Try taking Tylenol Arthritis for your back pain.  It was a pleasure to meet you today! Please don't hesitate to call me with any questions. Welcome to [employer]!

## 2016-12-08 NOTE — Assessment & Plan Note (Signed)
Discussed use of Tylenol Arthritis, ROM exercises. Will continue to monitor.

## 2016-12-08 NOTE — Assessment & Plan Note (Signed)
Stable in the office today, continue current regimen. 

## 2016-12-08 NOTE — Progress Notes (Signed)
Subjective:    Patient ID: Tina Howard, female    DOB: 1933/04/13, 82 y.o.   MRN: [id number]  HPI  Tina Howard is an 82 year old female who presents today to transfer care from [facility]. She has no complaints today. She completed her [place] in October 2017.  1) Essential Hypertension: Currently managed on Losartan 50 mg, Diltiazem, lasix. She denies chest pain, shortness of breath, lower extremity edema. Her BP in the office today is 116/76.   2) Atrial Fibrillation: Diagnosed in May 2017. Currently managed on Flecanide, Diltazem, Lasix, and Eliquis. She currently follows with Dr. Nehemiah Massed with cardiology. She underwent cardioversion in late June 2017. She denies fatigue, chest pain, palpitations.   3) Congestive Heart Failure: Diagnosed in June 2017. Currently managed on Lasix BID. Feels well managed at this dose.  4) Osteoarthritis: History of back surgery in 1984 secondary to herniated disc to L5 with nerve impingement. She has taken Tylenol without much improvement. Historically she's taken ibuprofen in the past but cannot take due to Eliquis. Her back bothers her several days of the week. She denies numbness/tingling, radiculopathy.   Review of Systems  Constitutional: Negative for fatigue.  Respiratory: Negative for shortness of breath.   Cardiovascular: Negative for chest pain, palpitations and leg swelling.  Musculoskeletal: Positive for arthralgias and back pain.  Neurological: Negative for dizziness, weakness and headaches.       Past Medical History:  Diagnosis Date  . Allergic rhinitis   . History of pelvic mass    s/p resection, benign  . Hypercholesteremia   . Hypertension   . Osteoporosis    per pt report on Medical Screening Form   . Shortness of breath dyspnea   . Varicella zoster 2007     Social History   Social History  . Marital status: Single    Spouse name: N/A  . Number of children: N/A  . Years of education: N/A   Occupational History    . Not on file.   Social History Main Topics  . Smoking status: Never Smoker  . Smokeless tobacco: Never Used  . Alcohol use 1.2 oz/week    2 Glasses of wine per week     Comment: daily  . Drug use: No  . Sexual activity: No   Other Topics Concern  . Not on file   Social History Narrative   Widowed.   Very active within her community.   Enjoys singing, being active in her church, spending time with family.    Past Surgical History:  Procedure Laterality Date  . BACK SURGERY  1984  . CATARACT EXTRACTION Bilateral 2014   Dr. Sandra Cockayne  . ELECTROPHYSIOLOGIC STUDY N/A 06/15/2016   Procedure: CARDIOVERSION;  Surgeon: [name], MD;  Location: [facility] ORS;  Service: Cardiovascular;  Laterality: N/A;  . FOOT SURGERY    . HERNIA REPAIR  0000000   Umbillical  . KNEE ARTHROSCOPY     right  . TOTAL KNEE ARTHROPLASTY     left  . VAGINAL HYSTERECTOMY     benign reasons    Family History  Problem Relation Age of Onset  . Heart disease Father     Allergies  Allergen Reactions  . Erythromycin Nausea And Vomiting    Current Outpatient Prescriptions on File Prior to Visit  Medication Sig Dispense Refill  . Acetaminophen (TYLENOL 8 HOUR PO) Take by mouth as needed.    . AMOXICILLIN PO Take 125 mg by mouth. Take four tablets  before dentist appointment.    Marland Kitchen apixaban (ELIQUIS) 5 MG TABS tablet Take by mouth.    . Cholecalciferol (VITAMIN D) 2000 units CAPS Take 1 capsule by mouth daily.    Marland Kitchen diltiazem (TIAZAC) 120 MG 24 hr capsule Take by mouth.    . flecainide (TAMBOCOR) 50 MG tablet Take 1 tablet (50 mg total) by mouth 2 (two) times daily. 60 tablet 4  . furosemide (LASIX) 40 MG tablet Take 1 tablet (40 mg total) by mouth 2 (two) times daily. 60 tablet 0  . losartan (COZAAR) 50 MG tablet Take 1 tablet (50 mg total) by mouth daily. 90 tablet 3  . timolol (TIMOPTIC) 0.5 % ophthalmic solution Place 1 drop into both eyes every evening.      No current facility-administered  medications on file prior to visit.     BP 116/76 (BP Location: Left Arm, Patient Position: Sitting, Cuff Size: Normal)   Pulse 64   Temp 98.1 F (36.7 C) (Oral)   Ht 5\' 1"  (1.549 m)   Wt 194 lb (88 kg)   SpO2 99%   BMI 36.66 kg/m    Objective:   Physical Exam  Constitutional: She appears well-nourished.  Neck: Neck supple.  Cardiovascular: Normal rate and regular rhythm.   Pulmonary/Chest: Effort normal and breath sounds normal.  Skin: Skin is warm and dry.  Psychiatric: She has a normal mood and affect.          Assessment & Plan:

## 2016-12-08 NOTE — Assessment & Plan Note (Signed)
Regular rate and rhythm. S/P ablation in June 2017. Continue Flecanide, Diltiazem, Eliquis.

## 2016-12-08 NOTE — Assessment & Plan Note (Signed)
Discussed use of Tylenol Arthritis. May require PT in the future, will continue to monitor.

## 2016-12-08 NOTE — Progress Notes (Signed)
Pre visit review using our clinic review tool, if applicable. No additional management support is needed unless otherwise documented below in the visit note. 

## 2016-12-13 ENCOUNTER — Ambulatory Visit: Payer: Medicare Other | Admitting: Podiatry

## 2016-12-14 ENCOUNTER — Ambulatory Visit (INDEPENDENT_AMBULATORY_CARE_PROVIDER_SITE_OTHER): Payer: Medicare Other | Admitting: Podiatry

## 2016-12-14 VITALS — BP 178/94 | HR 59

## 2016-12-14 DIAGNOSIS — M79609 Pain in unspecified limb: Secondary | ICD-10-CM | POA: Diagnosis not present

## 2016-12-14 DIAGNOSIS — L851 Acquired keratosis [keratoderma] palmaris et plantaris: Secondary | ICD-10-CM | POA: Diagnosis not present

## 2016-12-14 DIAGNOSIS — B351 Tinea unguium: Secondary | ICD-10-CM

## 2016-12-14 DIAGNOSIS — M79672 Pain in left foot: Secondary | ICD-10-CM | POA: Diagnosis not present

## 2016-12-14 DIAGNOSIS — M779 Enthesopathy, unspecified: Secondary | ICD-10-CM

## 2016-12-14 DIAGNOSIS — L84 Corns and callosities: Secondary | ICD-10-CM

## 2016-12-14 DIAGNOSIS — M7752 Other enthesopathy of left foot: Secondary | ICD-10-CM | POA: Diagnosis not present

## 2016-12-14 DIAGNOSIS — M79671 Pain in right foot: Secondary | ICD-10-CM | POA: Diagnosis not present

## 2016-12-14 DIAGNOSIS — M19072 Primary osteoarthritis, left ankle and foot: Secondary | ICD-10-CM

## 2016-12-14 DIAGNOSIS — L603 Nail dystrophy: Secondary | ICD-10-CM

## 2016-12-14 DIAGNOSIS — L608 Other nail disorders: Secondary | ICD-10-CM

## 2016-12-14 DIAGNOSIS — M778 Other enthesopathies, not elsewhere classified: Secondary | ICD-10-CM

## 2016-12-23 MED ORDER — BETAMETHASONE SOD PHOS & ACET 6 (3-3) MG/ML IJ SUSP: 3.0000 mg | Freq: Once | INTRAMUSCULAR | Status: DC

## 2016-12-23 NOTE — Progress Notes (Signed)
SUBJECTIVE Patient  presents to office today complaining of elongated, thickened nails. Pain while ambulating in shoes. Patient is unable to trim their own nails.  Patient also has a new complaint today of pain and tenderness regarding the left midfoot. Patient has been seen in the past by Dr. Milinda Pointer. Patient states that injections in the past have helped. Patient states the pain is been ongoing for several months. Patient presents today for further treatment and evaluation  OBJECTIVE General Patient is awake, alert, and oriented x 3 and in no acute distress. Derm painful hyperkeratotic skin lesion noted to the left plantar forefoot. Skin is dry and supple bilateral. Negative open lesions or macerations. Remaining integument unremarkable. Nails are tender, long, thickened and dystrophic with subungual debris, consistent with onychomycosis, 1-5 bilateral. No signs of infection noted. Vasc  DP and PT pedal pulses palpable bilaterally. Temperature gradient within normal limits.  Neuro Epicritic and protective threshold sensation diminished bilaterally.  Musculoskeletal Exam pain on palpation noted to the left midfoot at the level of the Lisfranc joint. No symptomatic pedal deformities noted bilateral. Muscular strength within normal limits.  ASSESSMENT 1. Onychodystrophic nails 1-5 bilateral with hyperkeratosis of nails.  2. Onychomycosis of nail due to dermatophyte bilateral 3. Pain in foot bilateral 4. Painful hyperkeratotic lesion left plantar forefoot 5. Arthritis left midfoot, Lisfranc joint  PLAN OF CARE 1. Patient evaluated today.  2. Instructed to maintain good pedal hygiene and foot care.  3. Mechanical debridement of nails 1-5 bilaterally performed using a nail nipper. Filed with dremel without incident.  4. Injection of 0.5 mL Celestone Soluspan injected in the patient's left midfoot, Lisfranc joint  5. Excisional debridement of painful callus lesion was performed to the left plantar  forefoot using a chisel blade without incident or bleeding  6. Return to clinic in 3 mos.    Edrick Kins, DPM [facility]  Dr. Edrick Kins, [address]                                        [place], [place] [address]                Office [phone]  Fax [phone]

## 2017-02-07 ENCOUNTER — Ambulatory Visit: Payer: Medicare Other | Admitting: Family Medicine

## 2017-02-14 ENCOUNTER — Ambulatory Visit: Payer: Medicare Other | Admitting: Podiatry

## 2017-02-14 DIAGNOSIS — H401132 Primary open-angle glaucoma, bilateral, moderate stage: Secondary | ICD-10-CM | POA: Diagnosis not present

## 2017-02-15 DIAGNOSIS — M1611 Unilateral primary osteoarthritis, right hip: Secondary | ICD-10-CM | POA: Diagnosis not present

## 2017-02-15 DIAGNOSIS — M5440 Lumbago with sciatica, unspecified side: Secondary | ICD-10-CM | POA: Diagnosis not present

## 2017-02-15 DIAGNOSIS — M25551 Pain in right hip: Secondary | ICD-10-CM | POA: Diagnosis not present

## 2017-02-17 ENCOUNTER — Other Ambulatory Visit: Payer: Self-pay | Admitting: Ophthalmology

## 2017-02-17 DIAGNOSIS — H401132 Primary open-angle glaucoma, bilateral, moderate stage: Secondary | ICD-10-CM | POA: Diagnosis not present

## 2017-02-17 DIAGNOSIS — H35033 Hypertensive retinopathy, bilateral: Secondary | ICD-10-CM

## 2017-02-17 DIAGNOSIS — H538 Other visual disturbances: Secondary | ICD-10-CM

## 2017-02-21 ENCOUNTER — Ambulatory Visit
Admission: RE | Admit: 2017-02-21 | Discharge: 2017-02-21 | Disposition: A | Discharge status: Home / Self Care | Payer: Medicare Other | Source: Ambulatory Visit | Attending: Ophthalmology | Admitting: Ophthalmology

## 2017-02-21 ENCOUNTER — Ambulatory Visit (INDEPENDENT_AMBULATORY_CARE_PROVIDER_SITE_OTHER): Payer: Medicare Other | Admitting: Podiatry

## 2017-02-21 ENCOUNTER — Encounter: Payer: Self-pay | Admitting: Podiatry

## 2017-02-21 DIAGNOSIS — I6523 Occlusion and stenosis of bilateral carotid arteries: Secondary | ICD-10-CM | POA: Diagnosis not present

## 2017-02-21 DIAGNOSIS — H538 Other visual disturbances: Secondary | ICD-10-CM | POA: Diagnosis not present

## 2017-02-21 DIAGNOSIS — H35033 Hypertensive retinopathy, bilateral: Secondary | ICD-10-CM | POA: Diagnosis not present

## 2017-02-21 DIAGNOSIS — M79609 Pain in unspecified limb: Principal | ICD-10-CM

## 2017-02-21 DIAGNOSIS — M79676 Pain in unspecified toe(s): Secondary | ICD-10-CM | POA: Diagnosis not present

## 2017-02-21 DIAGNOSIS — B351 Tinea unguium: Secondary | ICD-10-CM | POA: Diagnosis not present

## 2017-02-21 DIAGNOSIS — L851 Acquired keratosis [keratoderma] palmaris et plantaris: Secondary | ICD-10-CM

## 2017-02-21 NOTE — Progress Notes (Signed)
Complaint:  Visit Type: Patient returns to my office for continued preventative foot care services. Complaint: Patient states" my nails have grown long and thick and become painful to walk and wear shoes" Patient has history of previous foot surgery. The patient presents for preventative foot care services. No changes to ROS  Podiatric Exam: Vascular: dorsalis pedis and posterior tibial pulses are palpable bilateral. Capillary return is immediate. Temperature gradient is WNL. Skin turgor WNL  Sensorium: Diminished  Semmes Weinstein monofilament test. Normal tactile sensation bilaterally. Nail Exam: Pt has thick disfigured discolored nails with subungual debris noted bilateral entire nail hallux through fifth toenails Ulcer Exam: There is no evidence of ulcer or pre-ulcerative changes or infection. Orthopedic Exam: Muscle tone and strength are WNL. No limitations in general ROM. No crepitus or effusions noted. Foot type and digits show no abnormalities. Bony prominences are unremarkable. Skin:  Porokeratosis sub 4 left foot. No infection or ulcers  Diagnosis:  Onychomycosis, , Pain in right toe, pain in left toes,  Debride porokeratosis   Treatment & Plan Procedures and Treatment: Consent by patient was obtained for treatment procedures. The patient understood the discussion of treatment and procedures well. All questions were answered thoroughly reviewed. Debridement of mycotic and hypertrophic toenails, 1 through 5 bilateral and clearing of subungual debris. No ulceration, no infection noted. Debride porokeratosis Return Visit-Office Procedure: Patient instructed to return to the office for a follow up visit 3 months for continued evaluation and treatment.    Gardiner Barefoot DPM

## 2017-03-23 ENCOUNTER — Other Ambulatory Visit: Payer: Self-pay

## 2017-03-23 MED ORDER — LOSARTAN POTASSIUM 50 MG PO TABS: 50.0000 mg | ORAL_TABLET | Freq: Every day | ORAL | 2 refills | Status: DC

## 2017-03-23 NOTE — Telephone Encounter (Signed)
Pt left v/m requesting refill [facility]artan [facility]. Pt has CPX scheduled 10/2017. Refill done per protocol and per DPR left v/m refill done.

## 2017-04-04 DIAGNOSIS — R5383 Other fatigue: Secondary | ICD-10-CM | POA: Diagnosis not present

## 2017-04-04 DIAGNOSIS — I10 Essential (primary) hypertension: Secondary | ICD-10-CM | POA: Diagnosis not present

## 2017-04-04 DIAGNOSIS — E784 Other hyperlipidemia: Secondary | ICD-10-CM | POA: Diagnosis not present

## 2017-04-04 DIAGNOSIS — I480 Paroxysmal atrial fibrillation: Secondary | ICD-10-CM | POA: Diagnosis not present

## 2017-04-05 ENCOUNTER — Telehealth: Payer: Self-pay

## 2017-04-05 DIAGNOSIS — M533 Sacrococcygeal disorders, not elsewhere classified: Secondary | ICD-10-CM | POA: Diagnosis not present

## 2017-04-05 NOTE — Telephone Encounter (Signed)
Pt left vm having pressure in pelvic area but no problem urinating. Pt wants to leave a u/a. Left v/m for pt to call [facility] and will need to schedule appt to be seen for eval.

## 2017-04-06 ENCOUNTER — Ambulatory Visit (INDEPENDENT_AMBULATORY_CARE_PROVIDER_SITE_OTHER): Payer: Medicare Other | Admitting: Family Medicine

## 2017-04-06 ENCOUNTER — Encounter: Payer: Self-pay | Admitting: Family Medicine

## 2017-04-06 VITALS — BP 154/82 | HR 73 | Temp 98.3°F | Wt 201.0 lb

## 2017-04-06 DIAGNOSIS — R1030 Lower abdominal pain, unspecified: Secondary | ICD-10-CM | POA: Diagnosis not present

## 2017-04-06 LAB — POC URINALSYSI DIPSTICK (AUTOMATED)
Bilirubin, UA: NEGATIVE
Blood, UA: NEGATIVE
GLUCOSE UA: NEGATIVE
Ketones, UA: NEGATIVE
LEUKOCYTES UA: NEGATIVE
Nitrite, UA: NEGATIVE
Protein, UA: NEGATIVE
Spec Grav, UA: 1.02 (ref 1.010–1.025)
UROBILINOGEN UA: 0.2 U/dL
pH, UA: 6.5 (ref 5.0–8.0)

## 2017-04-06 NOTE — Telephone Encounter (Signed)
Pt called and scheduled appt with D Gessner FNP 04/06/17 at 1pm.

## 2017-04-06 NOTE — Patient Instructions (Signed)
Please stop at the front desk to schedule your ultrasound. I will notify you of results when they are available.  If you symptoms change or worsen, please let me or Allie Bossier know.  Have a great trip!

## 2017-04-06 NOTE — Progress Notes (Signed)
Pre visit review using our clinic review tool, if applicable. No additional management support is needed unless otherwise documented below in the visit note. 

## 2017-04-06 NOTE — Telephone Encounter (Signed)
Unable to reach pt by phone and spoke with son (DPR signed and he will try to reach pt to have her call for appt.

## 2017-04-06 NOTE — Progress Notes (Signed)
Subjective:    Patient ID: Tina Howard, female    DOB: 05/12/1933, 83 y.o.   MRN: [id number]  HPI This is an 83 yo female who presents today with 1 month of lower pelvic pressure. Has all the time. Has urinary frequency that seems related to lasix. No dysuria. History of hysterectomy. Had pelvic mass with surgery in 2002. Benign mass that was picked up on Dexa scan. Had a bladder suspension at time of hysterectomy (1970s). Has umbilical hernia that has been unchanged for years. She is concerned that she may have some bladder dropping.  No fever/chills, no urinary/bowel incontinence, no nausea/vomiting.   Past Medical History:  Diagnosis Date  . Allergic rhinitis   . History of pelvic mass    s/p resection, benign  . Hypercholesteremia   . Hypertension   . Osteoporosis    per pt report on Medical Screening Form   . Shortness of breath dyspnea   . Varicella zoster 2007   Past Surgical History:  Procedure Laterality Date  . BACK SURGERY  1984  . CATARACT EXTRACTION Bilateral 2014   Dr. Sandra Cockayne  . ELECTROPHYSIOLOGIC STUDY N/A 06/15/2016   Procedure: CARDIOVERSION;  Surgeon: [name], MD;  Location: [facility] ORS;  Service: Cardiovascular;  Laterality: N/A;  . FOOT SURGERY    . HERNIA REPAIR  2633   Umbillical  . KNEE ARTHROSCOPY     right  . TOTAL KNEE ARTHROPLASTY     left  . VAGINAL HYSTERECTOMY     benign reasons   Family History  Problem Relation Age of Onset  . Heart disease Father    Social History  Substance Use Topics  . Smoking status: Never Smoker  . Smokeless tobacco: Never Used  . Alcohol use 1.2 oz/week    2 Glasses of wine per week     Comment: daily       Review of Systems Per HPI    Objective:   Physical Exam  Constitutional: She is oriented to person, place, and time. She appears well-developed and well-nourished.  HENT:  Head: Normocephalic and atraumatic.  Cardiovascular: Normal rate.   Pulmonary/Chest: Effort normal.  Abdominal:  Soft. She exhibits no distension. There is no rebound and no guarding. A hernia (left mid abdomen, firm, more pronounced with abdominal flexion. Non tender) is present.  Genitourinary: Vagina normal. Rectal exam shows no mass, no tenderness and anal tone normal. Pelvic exam was performed with patient supine. No labial fusion. There is no rash, tenderness, lesion or injury on the right labia. There is no rash, tenderness, lesion or injury on the left labia.  Genitourinary Comments: Palpable protrusion at end of vaginal. Some discomfort with bimanual exam at low, midline of abdomen. No rectocele or cystocele visualized with speculum exam or palpated on bimanual/rectal exam.   Neurological: She is alert and oriented to person, place, and time.  Skin: Skin is warm and dry.  Psychiatric: She has a normal mood and affect.  Vitals reviewed.     BP (!) 154/82 (BP Location: Left Arm, Patient Position: Sitting, Cuff Size: Normal)   Pulse 73   Temp 98.3 F (36.8 C) (Oral)   Wt 201 lb (91.2 kg)   SpO2 98%   BMI 37.98 kg/m  Wt Readings from Last 3 Encounters:  04/06/17 201 lb (91.2 kg)  12/08/16 194 lb (88 kg)  10/25/16 196 lb (88.9 kg)   BP Readings from Last 3 Encounters:  04/06/17 (!) 154/82  12/14/16 (!) 178/94  12/08/16 116/76   Results for orders placed or performed in visit on 04/06/17  POCT Urinalysis Dipstick (Automated)  Result Value Ref Range   Color, UA yellow    Clarity, UA clear    Glucose, UA neg    Bilirubin, UA neg    Ketones, UA neg    Spec Grav, UA 1.020 1.010 - 1.025   Blood, UA neg    pH, UA 6.5 5.0 - 8.0   Protein, UA neg    Urobilinogen, UA 0.2 0.2 or 1.0 E.U./dL   Nitrite, UA neg    Leukocytes, UA Negative        Assessment & Plan:  1. Lower abdominal pain - UA negative, will check abd/transvaginal US due to r/o mass - POCT Urinalysis Dipstick (Automated) - US Abdomen Complete; Future - US Transvaginal Non-OB; Future  Clarene Reamer, FNP-BC  [place]  [facility]  04/06/2017 2:58 PM

## 2017-04-07 ENCOUNTER — Other Ambulatory Visit: Payer: Self-pay | Admitting: Family Medicine

## 2017-04-07 DIAGNOSIS — R102 Pelvic and perineal pain: Secondary | ICD-10-CM

## 2017-04-08 ENCOUNTER — Other Ambulatory Visit: Payer: Self-pay | Admitting: Family Medicine

## 2017-04-08 DIAGNOSIS — R102 Pelvic and perineal pain: Secondary | ICD-10-CM

## 2017-04-08 DIAGNOSIS — M461 Sacroiliitis, not elsewhere classified: Secondary | ICD-10-CM | POA: Diagnosis not present

## 2017-04-13 ENCOUNTER — Ambulatory Visit: Payer: Medicare Other | Admitting: Family Medicine

## 2017-04-26 ENCOUNTER — Ambulatory Visit
Admission: RE | Admit: 2017-04-26 | Discharge: 2017-04-26 | Disposition: A | Discharge status: Home / Self Care | Payer: Medicare Other | Source: Ambulatory Visit | Attending: Family Medicine | Admitting: Family Medicine

## 2017-04-26 ENCOUNTER — Ambulatory Visit: Payer: Medicare Other

## 2017-04-26 DIAGNOSIS — Z90710 Acquired absence of both cervix and uterus: Secondary | ICD-10-CM | POA: Insufficient documentation

## 2017-04-26 DIAGNOSIS — R102 Pelvic and perineal pain: Secondary | ICD-10-CM | POA: Diagnosis not present

## 2017-04-27 ENCOUNTER — Telehealth: Payer: Self-pay | Admitting: Family Medicine

## 2017-04-27 NOTE — Telephone Encounter (Signed)
Patient called in reference to a voice mail she received after going to [facility] yesterday. Patient said she could hardly understand the voicemail but this is the phone number they gave her to call back at. Patient said she will not be home to answer the phone until 1:30p.m. Today. But it is ok to leave a message on her home phone [phone]. Please call patient and advise.

## 2017-04-28 NOTE — Telephone Encounter (Signed)
Called and spoke with patient informing her of her results. Understanding verbalized nothing further needed at this time.

## 2017-05-02 ENCOUNTER — Ambulatory Visit (INDEPENDENT_AMBULATORY_CARE_PROVIDER_SITE_OTHER): Payer: Medicare Other | Admitting: Podiatry

## 2017-05-02 DIAGNOSIS — L851 Acquired keratosis [keratoderma] palmaris et plantaris: Secondary | ICD-10-CM

## 2017-05-02 DIAGNOSIS — B351 Tinea unguium: Secondary | ICD-10-CM

## 2017-05-02 DIAGNOSIS — L84 Corns and callosities: Secondary | ICD-10-CM | POA: Diagnosis not present

## 2017-05-02 DIAGNOSIS — M79609 Pain in unspecified limb: Secondary | ICD-10-CM | POA: Diagnosis not present

## 2017-05-02 NOTE — Progress Notes (Signed)
Complaint:  Visit Type: Patient returns to my office for continued preventative foot care services. Complaint: Patient states" my nails have grown long and thick and become painful to walk and wear shoes" Patient has history of previous foot surgery. The patient presents for preventative foot care services.   Podiatric Exam: Vascular: dorsalis pedis and posterior tibial pulses are palpable bilateral. Capillary return is immediate. Temperature gradient is WNL. Skin turgor WNL  Sensorium: Diminished  Semmes Weinstein monofilament test. Normal tactile sensation bilaterally. Nail Exam: Pt has thick disfigured discolored nails with subungual debris noted bilateral entire nail hallux through fifth toenails Ulcer Exam: There is no evidence of ulcer or pre-ulcerative changes or infection. Orthopedic Exam: Muscle tone and strength are WNL. No limitations in general ROM. No crepitus or effusions noted. Foot type and digits show no abnormalities. Bony prominences are unremarkable. Skin:  Porokeratosis sub 4 left foot. No infection or ulcers  Diagnosis:  Onychomycosis, , Pain in right toe, pain in left toes,  Debride porokeratosis   Treatment & Plan Procedures and Treatment: Consent by patient was obtained for treatment procedures. The patient understood the discussion of treatment and procedures well. All questions were answered thoroughly reviewed. Debridement of mycotic and hypertrophic toenails, 1 through 5 bilateral and clearing of subungual debris. No ulceration, no infection noted. Debride porokeratosis Return Visit-Office Procedure: Patient instructed to return to the office for a follow up visit 3 months for continued evaluation and treatment.    Gardiner Barefoot DPM

## 2017-05-06 DIAGNOSIS — M1611 Unilateral primary osteoarthritis, right hip: Secondary | ICD-10-CM | POA: Diagnosis not present

## 2017-05-25 ENCOUNTER — Telehealth: Payer: Self-pay | Admitting: Primary Care

## 2017-05-25 NOTE — Telephone Encounter (Signed)
Pt stopped in to see if it's okay for her to transfer back to [facility]. She's not sure what provider she wants to see but wanted to check first to see if she could change.

## 2017-05-25 NOTE — Telephone Encounter (Signed)
Fine with me, thanks. 

## 2017-05-26 NOTE — Telephone Encounter (Signed)
Please advise 

## 2017-05-26 NOTE — Telephone Encounter (Signed)
Pt wanted to know if she could est care with Arnett.. Please advise

## 2017-05-26 NOTE — Telephone Encounter (Signed)
FYI

## 2017-05-26 NOTE — Telephone Encounter (Signed)
okay

## 2017-06-03 DIAGNOSIS — M533 Sacrococcygeal disorders, not elsewhere classified: Secondary | ICD-10-CM | POA: Diagnosis not present

## 2017-06-06 ENCOUNTER — Ambulatory Visit (INDEPENDENT_AMBULATORY_CARE_PROVIDER_SITE_OTHER): Payer: Medicare Other | Admitting: Family

## 2017-06-06 ENCOUNTER — Encounter: Payer: Self-pay | Admitting: Family

## 2017-06-06 VITALS — BP 150/80 | HR 69 | Temp 98.5°F | Ht 61.0 in | Wt 200.2 lb

## 2017-06-06 DIAGNOSIS — Z1239 Encounter for other screening for malignant neoplasm of breast: Secondary | ICD-10-CM

## 2017-06-06 DIAGNOSIS — I480 Paroxysmal atrial fibrillation: Secondary | ICD-10-CM | POA: Diagnosis not present

## 2017-06-06 DIAGNOSIS — M25559 Pain in unspecified hip: Secondary | ICD-10-CM | POA: Diagnosis not present

## 2017-06-06 DIAGNOSIS — I10 Essential (primary) hypertension: Secondary | ICD-10-CM

## 2017-06-06 NOTE — Assessment & Plan Note (Signed)
Symptomatically stable. Follows with cardiology

## 2017-06-06 NOTE — Progress Notes (Signed)
Subjective:    Patient ID: OLA RAAP, female    DOB: December 13, 1933, 83 y.o.   MRN: [id number]  CC: Tina Howard is a 83 y.o. female who presents today for follow up.   HPI: Transfer from [facility] well today.   HTN- compliant with lasix, cozaar. Slightly elevated today however  Thinks from hip pain.  Denies exertional chest pain or pressure, numbness or tingling radiating to left arm or jaw, palpitations, dizziness, frequent headaches, changes in vision, or shortness of breath.   CHF- diagnosed June 2017. Lasix bid  Afib- on eliquis, diatiazem, flecainide; follows with Nehemiah Massed.  cardioversion June 2017 and no longer in a fib.   Follows with DPM- Prudence Davidson; Derrill Memo  Stress test 2017 normal per patient.   No longer does colonoscopy; will continue with mammogram         HISTORY:  Past Medical History:  Diagnosis Date  . Allergic rhinitis   . History of pelvic mass    s/p resection, benign  . Hypercholesteremia   . Hypertension   . Osteoporosis    per pt report on Medical Screening Form   . Shortness of breath dyspnea   . Varicella zoster 2007   Past Surgical History:  Procedure Laterality Date  . BACK SURGERY  1984  . CATARACT EXTRACTION Bilateral 2014   Dr. Sandra Cockayne  . ELECTROPHYSIOLOGIC STUDY N/A 06/15/2016   Procedure: CARDIOVERSION;  Surgeon: [name], MD;  Location: [facility] ORS;  Service: Cardiovascular;  Laterality: N/A;  . FOOT SURGERY    . HERNIA REPAIR  8413   Umbillical  . KNEE ARTHROSCOPY     right  . TOTAL KNEE ARTHROPLASTY     left  . VAGINAL HYSTERECTOMY     benign reasons   Family History  Problem Relation Age of Onset  . Heart disease Father     Allergies: Erythromycin Current Outpatient Prescriptions on File Prior to Visit  Medication Sig Dispense Refill  . Acetaminophen (TYLENOL 8 HOUR PO) Take by mouth as needed.    Marland Kitchen apixaban (ELIQUIS) 5 MG TABS tablet Take by mouth.    . Cholecalciferol (VITAMIN D)  2000 units CAPS Take 1 capsule by mouth daily.    . diclofenac sodium (VOLTAREN) 1 % GEL Apply topically.    Marland Kitchen diltiazem (TIAZAC) 120 MG 24 hr capsule Take by mouth.    . docusate calcium (SURFAK) 240 MG capsule Take by mouth.    . flecainide (TAMBOCOR) 50 MG tablet Take 1 tablet (50 mg total) by mouth 2 (two) times daily. 60 tablet 4  . furosemide (LASIX) 40 MG tablet Take 1 tablet (40 mg total) by mouth 2 (two) times daily. 60 tablet 0  . losartan (COZAAR) 50 MG tablet Take 1 tablet (50 mg total) by mouth daily. 90 tablet 2  . timolol (TIMOPTIC) 0.5 % ophthalmic solution Place 1 drop into both eyes every evening.      No current facility-administered medications on file prior to visit.     Social History  Substance Use Topics  . Smoking status: Never Smoker  . Smokeless tobacco: Never Used  . Alcohol use 1.2 oz/week    2 Glasses of wine per week     Comment: daily    Review of Systems  Constitutional: Negative for chills and fever.  Respiratory: Negative for cough.   Cardiovascular: Negative for chest pain and palpitations.  Gastrointestinal: Negative for nausea and vomiting.      Objective:  BP (!) 150/80   Pulse 69   Temp 98.5 F (36.9 C) (Oral)   Ht 5\' 1"  (1.549 m)   Wt 200 lb 3.2 oz (90.8 kg)   SpO2 96%   BMI 37.83 kg/m  BP Readings from Last 3 Encounters:  06/07/17 (!) 150/80  04/06/17 (!) 154/82  12/14/16 (!) 178/94   Wt Readings from Last 3 Encounters:  06/06/17 200 lb 3.2 oz (90.8 kg)  04/06/17 201 lb (91.2 kg)  12/08/16 194 lb (88 kg)    Physical Exam  Constitutional: She appears well-developed and well-nourished.  Eyes: Conjunctivae are normal.  Cardiovascular: Normal rate, regular rhythm, normal heart sounds and normal pulses.   Pulmonary/Chest: Effort normal and breath sounds normal. She has no wheezes. She has no rhonchi. She has no rales.  Neurological: She is alert.  Skin: Skin is warm and dry.  Psychiatric: She has a normal mood and affect.  Her speech is normal and behavior is normal. Thought content normal.  Vitals reviewed.      Assessment & Plan:   Problem List Items Addressed This Visit      Cardiovascular and Mediastinum   Paroxysmal A-fib ([place])    Symptomatically stable. Follows with cardiology      Essential hypertension - Primary    Elevated. Suspect pain contributing. Patient will keep log and call with values. Pending bmp.       Relevant Orders   Basic metabolic panel     Other   Screening for breast cancer    Ordered; patient will schedule.       Relevant Orders   MM SCREENING BREAST TOMO BILATERAL       I have discontinued Tina Howard AMOXICILLIN PO. I am also having her maintain her timolol, Vitamin D, furosemide, flecainide, apixaban, diltiazem, Acetaminophen (TYLENOL 8 HOUR PO), losartan, diclofenac sodium, docusate calcium, celecoxib, and cyclobenzaprine.   Meds ordered this encounter  Medications  . celecoxib (CELEBREX) 200 MG capsule    Sig: Take by mouth.  . cyclobenzaprine (FLEXERIL) 5 MG tablet    Sig: Take by mouth.    Return precautions given.   Risks, benefits, and alternatives of the medications and treatment plan prescribed today were discussed, and patient expressed understanding.   Education regarding symptom management and diagnosis given to patient on AVS.  Continue to follow with Burnard Hawthorne, FNP for routine health maintenance.   Illene Labrador and I agreed with plan.   Mable Paris, FNP

## 2017-06-06 NOTE — Assessment & Plan Note (Addendum)
Elevated. Suspect pain contributing. Patient will keep log and call with values. Pending bmp.

## 2017-06-06 NOTE — Patient Instructions (Addendum)
My pleasure seeing you.   BP daily and call me with results.   Labs   Follow up 3 months  We placed a referral. Mammogram this year. I asked that you call one the below locations and schedule this when it is convenient for you.   If you have dense breasts, you may ask for 3D mammogram over the traditional 2D mammogram as new evidence suggest 3D is superior. Please note that NOT all insurance companies cover 3D and you may have to pay a higher copay. You may call your insurance company to further clarify your benefits.   Options for [place]  [place], [place]  * [facility] [place] * Note if you ask for 3D mammogram at this location, you must request [place], [place] location*

## 2017-06-06 NOTE — Progress Notes (Signed)
Pre visit review using our clinic review tool, if applicable. No additional management support is needed unless otherwise documented below in the visit note. 

## 2017-06-07 LAB — BASIC METABOLIC PANEL
BUN: 12 mg/dL (ref 6–23)
CALCIUM: 9.6 mg/dL (ref 8.4–10.5)
CO2: 36 mEq/L — ABNORMAL HIGH (ref 19–32)
CREATININE: 0.57 mg/dL (ref 0.40–1.20)
Chloride: 102 mEq/L (ref 96–112)
GFR: 107.92 mL/min (ref >60.00)
Glucose, Bld: 90 mg/dL (ref 70–99)
Potassium: 3.7 mEq/L (ref 3.5–5.1)
SODIUM: 143 meq/L (ref 135–145)

## 2017-06-07 NOTE — Assessment & Plan Note (Signed)
Ordered; patient will schedule.

## 2017-06-09 ENCOUNTER — Telehealth: Payer: Self-pay | Admitting: Family

## 2017-06-09 ENCOUNTER — Other Ambulatory Visit: Payer: Self-pay | Admitting: Family

## 2017-06-09 ENCOUNTER — Other Ambulatory Visit (INDEPENDENT_AMBULATORY_CARE_PROVIDER_SITE_OTHER): Payer: Medicare Other

## 2017-06-09 DIAGNOSIS — R899 Unspecified abnormal finding in specimens from other organs, systems and tissues: Secondary | ICD-10-CM

## 2017-06-09 MED ORDER — LOSARTAN POTASSIUM 100 MG PO TABS: 100.0000 mg | ORAL_TABLET | Freq: Every day | ORAL | 2 refills | Status: DC

## 2017-06-09 NOTE — Telephone Encounter (Signed)
That is correct. Losartan 100 mg sent to pharmacy

## 2017-06-09 NOTE — Telephone Encounter (Signed)
Patient came into office with home BP cuff stating her BP was very high, had patient come into nurses room and rest for a few minutes, patient explained she saw NP on Monday and BP was elevated at 150/80 and NP had her to monitor pressure. Check patient vitals pulse 70 BP in left arm 190/94 02 sat @ 98 on room air. NP out of Office spoke with Dr. Derrel Nip was advised to have patient increase losartan to 100 mg and to re-check BP in one week with nurse visit. Patient stated she would take an extra 50 mg losartan when she gets home and continue every day.

## 2017-06-10 LAB — BASIC METABOLIC PANEL
BUN: 14 mg/dL (ref 6–23)
CO2: 31 mEq/L (ref 19–32)
Calcium: 9.1 mg/dL (ref 8.4–10.5)
Chloride: 99 mEq/L (ref 96–112)
Creatinine, Ser: 0.59 mg/dL (ref 0.40–1.20)
GFR: 103.7 mL/min (ref >60.00)
GLUCOSE: 97 mg/dL (ref 70–99)
POTASSIUM: 3.2 meq/L — AB (ref 3.5–5.1)
Sodium: 139 mEq/L (ref 135–145)

## 2017-06-13 ENCOUNTER — Telehealth: Payer: Self-pay

## 2017-06-13 ENCOUNTER — Telehealth: Payer: Self-pay | Admitting: Family

## 2017-06-13 ENCOUNTER — Other Ambulatory Visit: Payer: Self-pay | Admitting: Family

## 2017-06-13 DIAGNOSIS — G8929 Other chronic pain: Secondary | ICD-10-CM | POA: Diagnosis not present

## 2017-06-13 DIAGNOSIS — M545 Low back pain: Secondary | ICD-10-CM | POA: Diagnosis not present

## 2017-06-13 NOTE — Telephone Encounter (Signed)
Patient would like provider to put in RX of potassium so there is no confusion. Please advise.

## 2017-06-13 NOTE — Telephone Encounter (Signed)
Pt called back returning your call. Please advise, thank you!  Call pt @ [phone]

## 2017-06-14 NOTE — Telephone Encounter (Signed)
As we discussed -  Please call pt and clarify that she does NOT need potassium supplement of any kind. These can be dangerous and potassium too high OR low can cause deadly heart arrhythmias.  Lasix is causing her potassium to drop and by stopping the medication , her potassium will normalize on its own.

## 2017-06-14 NOTE — Telephone Encounter (Signed)
See previous message

## 2017-06-14 NOTE — Telephone Encounter (Signed)
Spoke with pt and informed her of Margaret's message below. The pt gave a verbal understanding.

## 2017-06-16 ENCOUNTER — Ambulatory Visit (INDEPENDENT_AMBULATORY_CARE_PROVIDER_SITE_OTHER): Payer: Medicare Other

## 2017-06-16 VITALS — BP 150/88 | HR 80 | Resp 18

## 2017-06-16 DIAGNOSIS — I10 Essential (primary) hypertension: Secondary | ICD-10-CM

## 2017-06-16 DIAGNOSIS — M545 Low back pain: Secondary | ICD-10-CM | POA: Diagnosis not present

## 2017-06-16 DIAGNOSIS — G8929 Other chronic pain: Secondary | ICD-10-CM | POA: Diagnosis not present

## 2017-06-16 NOTE — Progress Notes (Addendum)
Patient came in for a BP check.  Had a nurse visit on 06/09/17, BP was elevated, so advised to increase her Losartan to 100mg  a day.  She currently takes diltiazem and Eliqius for her Afib. Denies SOB and chest pain.  Does have pain in left shoulder region, but contributes it to her shoulder tear from years ago.  She did state that she is very tired again, similar to how she felt a year ago when all her heart things started.  She seems little stressed too, had a flat tire last week, she is active with going to church and bible studies and chorus practice.  Takes her losartan in the AM has been checking her BP but it is currently stressing her more.  Bought her home cuff and I had her demonstrate how she checks her BP and she did great. Reading was 194/104 HR 84. The cuff is difficult for her to put on, its a upper arm cuff and I am not sure that it is very accurate for her.  She asked about a wrist cuff, thoughts?  She does have a follow up with Cardiology on August 7th.  Doesn't know if her BP is high due to her stress or her back discomfort.   Please advise, thanks  Agree with wrist cuff as may more accurate based on body type. Think august is too far off for [facility]. Please ask her to call and make SOONER appointment.  Back pain certainly can increase blood pressure and taking repeatedly as well.  150/88 is not at goal however I would feel more comfortable with cardiology adjusting her meds.  If she cannot get an appt with them in the next week, please let me know.  Joycelyn Schmid

## 2017-06-16 NOTE — Progress Notes (Signed)
Patient has been notified. She stated she will comply with margarets directions.

## 2017-06-20 ENCOUNTER — Telehealth: Payer: Self-pay | Admitting: Family

## 2017-06-20 DIAGNOSIS — G8929 Other chronic pain: Secondary | ICD-10-CM | POA: Diagnosis not present

## 2017-06-20 DIAGNOSIS — M545 Low back pain: Secondary | ICD-10-CM | POA: Diagnosis not present

## 2017-06-20 NOTE — Telephone Encounter (Signed)
FYI

## 2017-06-20 NOTE — Telephone Encounter (Signed)
Pt received a phone call this morning from [facility]. She has the following appts with Tye Maryland, PT on 06/22/2017, 06/23/17 and, on 08/02/2017 with Dr. Nehemiah Massed. Pt wanted Arnett to know this info.

## 2017-06-20 NOTE — Telephone Encounter (Signed)
Spoke to patient. Patient went to heart care and got an appointment for this Wednesday.  Patient did say she would comply with directions on if she has cardiac SX she would got to ED/Urgent care.

## 2017-06-20 NOTE — Telephone Encounter (Signed)
Call pt  She was here last week to see tanya for BP check  She mentioned she feels tired 'similar to how she felt with heart things started.'  I advised a SOONER appt with Nehemiah Massed.    August is TOO far off and likely he may need to adjust her antiarrythmic meds which I am NOT comfortable doing for her afib.   Adivse her to call his office and get on cancelation list.  If she has any cardiac symptoms, she needs urgent care OR ed.

## 2017-06-22 DIAGNOSIS — I10 Essential (primary) hypertension: Secondary | ICD-10-CM | POA: Diagnosis not present

## 2017-06-22 DIAGNOSIS — R5383 Other fatigue: Secondary | ICD-10-CM | POA: Diagnosis not present

## 2017-06-22 DIAGNOSIS — I480 Paroxysmal atrial fibrillation: Secondary | ICD-10-CM | POA: Diagnosis not present

## 2017-06-22 DIAGNOSIS — R0782 Intercostal pain: Secondary | ICD-10-CM | POA: Diagnosis not present

## 2017-06-23 DIAGNOSIS — G8929 Other chronic pain: Secondary | ICD-10-CM | POA: Diagnosis not present

## 2017-06-23 DIAGNOSIS — M545 Low back pain: Secondary | ICD-10-CM | POA: Diagnosis not present

## 2017-06-28 DIAGNOSIS — G8929 Other chronic pain: Secondary | ICD-10-CM | POA: Diagnosis not present

## 2017-06-28 DIAGNOSIS — M545 Low back pain: Secondary | ICD-10-CM | POA: Diagnosis not present

## 2017-06-30 DIAGNOSIS — M545 Low back pain: Secondary | ICD-10-CM | POA: Diagnosis not present

## 2017-06-30 DIAGNOSIS — G8929 Other chronic pain: Secondary | ICD-10-CM | POA: Diagnosis not present

## 2017-07-06 DIAGNOSIS — M545 Low back pain: Secondary | ICD-10-CM | POA: Diagnosis not present

## 2017-07-06 DIAGNOSIS — G8929 Other chronic pain: Secondary | ICD-10-CM | POA: Diagnosis not present

## 2017-07-08 DIAGNOSIS — G8929 Other chronic pain: Secondary | ICD-10-CM | POA: Diagnosis not present

## 2017-07-08 DIAGNOSIS — M545 Low back pain: Secondary | ICD-10-CM | POA: Diagnosis not present

## 2017-07-11 DIAGNOSIS — M545 Low back pain: Secondary | ICD-10-CM | POA: Diagnosis not present

## 2017-07-11 DIAGNOSIS — G8929 Other chronic pain: Secondary | ICD-10-CM | POA: Diagnosis not present

## 2017-07-13 DIAGNOSIS — G8929 Other chronic pain: Secondary | ICD-10-CM | POA: Diagnosis not present

## 2017-07-13 DIAGNOSIS — M545 Low back pain: Secondary | ICD-10-CM | POA: Diagnosis not present

## 2017-07-19 ENCOUNTER — Ambulatory Visit
Admission: RE | Admit: 2017-07-19 | Discharge: 2017-07-19 | Disposition: A | Discharge status: Home / Self Care | Payer: Medicare Other | Source: Ambulatory Visit | Attending: Family | Admitting: Family

## 2017-07-19 DIAGNOSIS — Z1231 Encounter for screening mammogram for malignant neoplasm of breast: Secondary | ICD-10-CM | POA: Diagnosis not present

## 2017-07-19 DIAGNOSIS — Z1239 Encounter for other screening for malignant neoplasm of breast: Secondary | ICD-10-CM

## 2017-07-21 DIAGNOSIS — I480 Paroxysmal atrial fibrillation: Secondary | ICD-10-CM | POA: Diagnosis not present

## 2017-07-21 DIAGNOSIS — R5383 Other fatigue: Secondary | ICD-10-CM | POA: Diagnosis not present

## 2017-07-21 DIAGNOSIS — I10 Essential (primary) hypertension: Secondary | ICD-10-CM | POA: Diagnosis not present

## 2017-07-27 DIAGNOSIS — M545 Low back pain: Secondary | ICD-10-CM | POA: Diagnosis not present

## 2017-07-27 DIAGNOSIS — G8929 Other chronic pain: Secondary | ICD-10-CM | POA: Diagnosis not present

## 2017-07-28 DIAGNOSIS — I480 Paroxysmal atrial fibrillation: Secondary | ICD-10-CM | POA: Diagnosis not present

## 2017-08-01 ENCOUNTER — Ambulatory Visit (INDEPENDENT_AMBULATORY_CARE_PROVIDER_SITE_OTHER): Payer: Medicare Other | Admitting: Podiatry

## 2017-08-01 DIAGNOSIS — M778 Other enthesopathies, not elsewhere classified: Secondary | ICD-10-CM

## 2017-08-01 DIAGNOSIS — M7752 Other enthesopathy of left foot: Secondary | ICD-10-CM

## 2017-08-01 DIAGNOSIS — M79609 Pain in unspecified limb: Secondary | ICD-10-CM

## 2017-08-01 DIAGNOSIS — M779 Enthesopathy, unspecified: Secondary | ICD-10-CM

## 2017-08-01 DIAGNOSIS — B351 Tinea unguium: Secondary | ICD-10-CM | POA: Diagnosis not present

## 2017-08-01 DIAGNOSIS — L851 Acquired keratosis [keratoderma] palmaris et plantaris: Secondary | ICD-10-CM | POA: Diagnosis not present

## 2017-08-01 NOTE — Progress Notes (Signed)
To PCP

## 2017-08-01 NOTE — Progress Notes (Signed)
Complaint:  Visit Type: Patient returns to my office for continued preventative foot care services. Complaint: Patient states" my nails have grown long and thick and become painful to walk and wear shoes" Patient has history of previous foot surgery. The patient presents for preventative foot care services.   Podiatric Exam: Vascular: dorsalis pedis and posterior tibial pulses are palpable bilateral. Capillary return is immediate. Temperature gradient is WNL. Skin turgor WNL  Sensorium: Diminished  Semmes Weinstein monofilament test. Normal tactile sensation bilaterally. Nail Exam: Pt has thick disfigured discolored nails with subungual debris noted bilateral entire nail hallux through fifth toenails Ulcer Exam: There is no evidence of ulcer or pre-ulcerative changes or infection. Orthopedic Exam: Muscle tone and strength are WNL. No limitations in general ROM. No crepitus or effusions noted. Foot type and digits show no abnormalities. Bony prominences are unremarkable. Pain sub 4th metatarsal left foot. Skin:  Porokeratosis sub 4 left foot. No infection or ulcers  Diagnosis:  Onychomycosis, , Pain in right toe, pain in left toes,  Debride porokeratosis   Treatment & Plan Procedures and Treatment: Consent by patient was obtained for treatment procedures. The patient understood the discussion of treatment and procedures well. All questions were answered thoroughly reviewed. Debridement of mycotic and hypertrophic toenails, 1 through 5 bilateral and clearing of subungual debris. No ulceration, no infection noted. Debride porokeratosis Return Visit-Office Procedure: Patient instructed to return to the office for a follow up visit 3 months for continued evaluation and treatment.    Gardiner Barefoot DPM

## 2017-08-02 DIAGNOSIS — W19XXXD Unspecified fall, subsequent encounter: Secondary | ICD-10-CM | POA: Insufficient documentation

## 2017-08-02 DIAGNOSIS — I440 Atrioventricular block, first degree: Secondary | ICD-10-CM | POA: Insufficient documentation

## 2017-08-02 DIAGNOSIS — Y92009 Unspecified place in unspecified non-institutional (private) residence as the place of occurrence of the external cause: Secondary | ICD-10-CM

## 2017-08-02 DIAGNOSIS — I480 Paroxysmal atrial fibrillation: Secondary | ICD-10-CM | POA: Diagnosis not present

## 2017-08-02 DIAGNOSIS — Z5181 Encounter for therapeutic drug level monitoring: Secondary | ICD-10-CM | POA: Diagnosis not present

## 2017-08-02 DIAGNOSIS — Z79899 Other long term (current) drug therapy: Secondary | ICD-10-CM | POA: Diagnosis not present

## 2017-08-02 DIAGNOSIS — I10 Essential (primary) hypertension: Secondary | ICD-10-CM | POA: Diagnosis not present

## 2017-08-02 HISTORY — DX: Unspecified place in unspecified non-institutional (private) residence as the place of occurrence of the external cause: W19.XXXD

## 2017-08-03 DIAGNOSIS — H903 Sensorineural hearing loss, bilateral: Secondary | ICD-10-CM | POA: Diagnosis not present

## 2017-08-10 DIAGNOSIS — M545 Low back pain: Secondary | ICD-10-CM | POA: Diagnosis not present

## 2017-08-10 DIAGNOSIS — G8929 Other chronic pain: Secondary | ICD-10-CM | POA: Diagnosis not present

## 2017-08-16 DIAGNOSIS — H401132 Primary open-angle glaucoma, bilateral, moderate stage: Secondary | ICD-10-CM | POA: Diagnosis not present

## 2017-08-23 DIAGNOSIS — H401132 Primary open-angle glaucoma, bilateral, moderate stage: Secondary | ICD-10-CM | POA: Diagnosis not present

## 2017-09-06 DIAGNOSIS — H35033 Hypertensive retinopathy, bilateral: Secondary | ICD-10-CM | POA: Diagnosis not present

## 2017-09-08 ENCOUNTER — Encounter: Payer: Self-pay | Admitting: Family

## 2017-09-08 ENCOUNTER — Ambulatory Visit (INDEPENDENT_AMBULATORY_CARE_PROVIDER_SITE_OTHER): Payer: Medicare Other | Admitting: Family

## 2017-09-08 VITALS — BP 136/64 | HR 61 | Temp 98.0°F | Ht 61.0 in | Wt 194.0 lb

## 2017-09-08 DIAGNOSIS — R7303 Prediabetes: Secondary | ICD-10-CM | POA: Diagnosis not present

## 2017-09-08 DIAGNOSIS — I10 Essential (primary) hypertension: Secondary | ICD-10-CM

## 2017-09-08 LAB — BASIC METABOLIC PANEL
BUN: 16 mg/dL (ref 6–23)
CALCIUM: 9 mg/dL (ref 8.4–10.5)
CHLORIDE: 102 meq/L (ref 96–112)
CO2: 32 meq/L (ref 19–32)
Creatinine, Ser: 0.6 mg/dL (ref 0.40–1.20)
GFR: 101.65 mL/min (ref >60.00)
Glucose, Bld: 113 mg/dL — ABNORMAL HIGH (ref 70–99)
Potassium: 3.8 mEq/L (ref 3.5–5.1)
SODIUM: 141 meq/L (ref 135–145)

## 2017-09-08 LAB — HEMOGLOBIN A1C: Hgb A1c MFr Bld: 5.8 % (ref 4.6–6.5)

## 2017-09-08 NOTE — Patient Instructions (Signed)
Blood pressure looks great!  Labs today  Follow up 3-6 months

## 2017-09-08 NOTE — Progress Notes (Signed)
Subjective:    Patient ID: Tina Howard, female    DOB: [birth year], 83 y.o.   MRN: [id number]  CC: Tina Howard is a 83 y.o. female who presents today for follow up.   HPI: HTN- elevated at last visit. Feeling well. No complaints.  Compliant with medications.  Denies exertional chest pain or pressure, numbness or tingling radiating to left arm or jaw, palpitations, dizziness, frequent headaches, changes in vision, or shortness of breath.   Chronic LE swelling- has been stable since decreased dose of lasix. No sob.    Cardiology- Linna Caprice; has changed from elliquis from [place].  On flecainade. Now taking one lasix per day, down  From BID, and started potassium. Also started hydralazine BID.  HISTORY:  Past Medical History:  Diagnosis Date  . Allergic rhinitis   . History of pelvic mass    s/p resection, benign  . Hypercholesteremia   . Hypertension   . Osteoporosis    per pt report on Medical Screening Form   . Shortness of breath dyspnea   . Varicella zoster 2007   Past Surgical History:  Procedure Laterality Date  . BACK SURGERY  1984  . BREAST CYST EXCISION Left    1990s  . CATARACT EXTRACTION Bilateral 2014   Dr. Sandra Cockayne  . ELECTROPHYSIOLOGIC STUDY N/A 06/15/2016   Procedure: CARDIOVERSION;  Surgeon: [name], MD;  Location: [facility] ORS;  Service: Cardiovascular;  Laterality: N/A;  . FOOT SURGERY    . HERNIA REPAIR  9937   Umbillical  . KNEE ARTHROSCOPY     right  . TOTAL KNEE ARTHROPLASTY     left  . VAGINAL HYSTERECTOMY     benign reasons   Family History  Problem Relation Age of Onset  . Heart disease Father   . Breast cancer Maternal Aunt 80    Allergies: Amlodipine and Erythromycin Current Outpatient Prescriptions on File Prior to Visit  Medication Sig Dispense Refill  . Acetaminophen (TYLENOL 8 HOUR PO) Take by mouth as needed.    . Cholecalciferol (VITAMIN D) 2000 units CAPS Take 1 capsule by mouth daily.    . cyclobenzaprine  (FLEXERIL) 5 MG tablet Take by mouth.    . docusate calcium (SURFAK) 240 MG capsule Take by mouth.    . flecainide (TAMBOCOR) 50 MG tablet Take 1 tablet (50 mg total) by mouth 2 (two) times daily. 60 tablet 4  . furosemide (LASIX) 40 MG tablet     . hydrALAZINE (APRESOLINE) 25 MG tablet Take by mouth.    . potassium chloride (K-DUR,KLOR-CON) 10 MEQ tablet Take by mouth.    . timolol (TIMOPTIC) 0.5 % ophthalmic solution Place 1 drop into both eyes every evening.     . diltiazem (TIAZAC) 120 MG 24 hr capsule Take by mouth.     No current facility-administered medications on file prior to visit.     Social History  Substance Use Topics  . Smoking status: Never Smoker  . Smokeless tobacco: Never Used  . Alcohol use 1.2 oz/week    2 Glasses of wine per week     Comment: daily    Review of Systems  Constitutional: Negative for chills and fever.  Respiratory: Negative for cough and shortness of breath.   Cardiovascular: Negative for chest pain, palpitations and leg swelling.  Gastrointestinal: Negative for nausea and vomiting.  Neurological: Negative for headaches.      Objective:    BP 136/64   Pulse 61   Temp 98  F (36.7 C) (Oral)   Ht 5\' 1"  (1.549 m)   Wt 194 lb (88 kg)   SpO2 95%   BMI 36.66 kg/m  BP Readings from Last 3 Encounters:  09/08/17 136/64  06/16/17 (!) 150/88  06/07/17 (!) 150/80   Wt Readings from Last 3 Encounters:  09/08/17 194 lb (88 kg)  06/06/17 200 lb 3.2 oz (90.8 kg)  04/06/17 201 lb (91.2 kg)    Physical Exam  Constitutional: She appears well-developed and well-nourished.  Eyes: Conjunctivae are normal.  Cardiovascular: Normal rate, regular rhythm, normal heart sounds and normal pulses.   Pulmonary/Chest: Effort normal and breath sounds normal. She has no wheezes. She has no rhonchi. She has no rales.  Neurological: She is alert.  Skin: Skin is warm and dry.  Psychiatric: She has a normal mood and affect. Her speech is normal and behavior is  normal. Thought content normal.  Vitals reviewed.      Assessment & Plan:   Problem List Items Addressed This Visit      Cardiovascular and Mediastinum   Essential hypertension - Primary    Controlled. Continue current regimen.       Relevant Medications   rivaroxaban (XARELTO) 20 MG TABS tablet   Other Relevant Orders   Basic metabolic panel     Other   Prediabetes    History of. Pending a1c      Relevant Orders   Hemoglobin A1c       I have discontinued Ms. Mavity's apixaban, diclofenac sodium, losartan, XARELTO, and losartan. I am also having her maintain her timolol, Vitamin D, flecainide, diltiazem, Acetaminophen (TYLENOL 8 HOUR PO), docusate calcium, cyclobenzaprine, furosemide, hydrALAZINE, potassium chloride, and rivaroxaban.   Meds ordered this encounter  Medications  . rivaroxaban (XARELTO) 20 MG TABS tablet    Sig: Take by mouth.    Return precautions given.   Risks, benefits, and alternatives of the medications and treatment plan prescribed today were discussed, and patient expressed understanding.   Education regarding symptom management and diagnosis given to patient on AVS.  Continue to follow with Burnard Hawthorne, FNP for routine health maintenance.   Illene Labrador and I agreed with plan.   Mable Paris, FNP

## 2017-09-08 NOTE — Assessment & Plan Note (Signed)
Controlled. Continue current regimen. 

## 2017-09-08 NOTE — Assessment & Plan Note (Signed)
History of. Pending a1c

## 2017-10-18 ENCOUNTER — Telehealth: Payer: Self-pay | Admitting: Family

## 2017-10-18 NOTE — Telephone Encounter (Signed)
Per M. Arnett; she ok's patient to transfer care to Dr. Olivia Mackie Mclean-Scocuzza.  Pt's insurance wants her to have a MD.  kp

## 2017-10-20 ENCOUNTER — Ambulatory Visit (INDEPENDENT_AMBULATORY_CARE_PROVIDER_SITE_OTHER): Payer: Medicare Other | Admitting: *Deleted

## 2017-10-20 DIAGNOSIS — Z23 Encounter for immunization: Secondary | ICD-10-CM

## 2017-10-28 ENCOUNTER — Ambulatory Visit: Payer: Medicare Other

## 2017-10-31 ENCOUNTER — Ambulatory Visit (INDEPENDENT_AMBULATORY_CARE_PROVIDER_SITE_OTHER): Payer: Medicare Other | Admitting: Podiatry

## 2017-10-31 ENCOUNTER — Encounter: Payer: Self-pay | Admitting: Podiatry

## 2017-10-31 DIAGNOSIS — L851 Acquired keratosis [keratoderma] palmaris et plantaris: Secondary | ICD-10-CM

## 2017-10-31 DIAGNOSIS — B351 Tinea unguium: Secondary | ICD-10-CM | POA: Diagnosis not present

## 2017-10-31 DIAGNOSIS — M79609 Pain in unspecified limb: Secondary | ICD-10-CM

## 2017-10-31 NOTE — Progress Notes (Addendum)
Complaint:  Visit Type: Patient returns to my office for continued preventative foot care services. Complaint: Patient states" my nails have grown long and thick and become painful to walk and wear shoes" Patient has history of previous foot surgery. The patient presents for preventative foot care services. Patient taking xarelto.  Podiatric Exam: Vascular: dorsalis pedis and posterior tibial pulses are palpable bilateral. Capillary return is immediate. Temperature gradient is WNL. Skin turgor WNL  Sensorium: Diminished  Semmes Weinstein monofilament test. Normal tactile sensation bilaterally. Nail Exam: Pt has thick disfigured discolored nails with subungual debris noted bilateral entire nail hallux through fifth toenails Ulcer Exam: There is no evidence of ulcer or pre-ulcerative changes or infection. Orthopedic Exam: Muscle tone and strength are WNL. No limitations in general ROM. No crepitus or effusions noted. Foot type and digits show no abnormalities. Bony prominences are unremarkable. Pain sub 4th metatarsal left foot. Skin:  Porokeratosis sub 4 left foot. No infection or ulcers  Diagnosis:  Onychomycosis, , Pain in right toe, pain in left toes,  Debride porokeratosis   Treatment & Plan Procedures and Treatment: Consent by patient was obtained for treatment procedures. The patient understood the discussion of treatment and procedures well. All questions were answered thoroughly reviewed. Debridement of mycotic and hypertrophic toenails, 1 through 5 bilateral and clearing of subungual debris. No ulceration, no infection noted. Debride porokeratosis.  ABN signed for 2018. Return Visit-Office Procedure: Patient instructed to return to the office for a follow up visit 3 months for continued evaluation and treatment.    Gardiner Barefoot DPM

## 2017-11-03 ENCOUNTER — Ambulatory Visit (INDEPENDENT_AMBULATORY_CARE_PROVIDER_SITE_OTHER): Payer: Medicare Other | Admitting: Family

## 2017-11-03 ENCOUNTER — Encounter: Payer: Self-pay | Admitting: Family

## 2017-11-03 VITALS — BP 138/74 | HR 67 | Temp 98.4°F | Ht 61.0 in

## 2017-11-03 DIAGNOSIS — L0232 Furuncle of buttock: Secondary | ICD-10-CM | POA: Diagnosis not present

## 2017-11-03 MED ORDER — CEPHALEXIN 500 MG PO CAPS: 500.0000 mg | ORAL_CAPSULE | Freq: Four times a day (QID) | ORAL | 0 refills | Status: DC

## 2017-11-03 NOTE — Progress Notes (Signed)
Pre visit review using our clinic review tool, if applicable. No additional management support is needed unless otherwise documented below in the visit note. 

## 2017-11-03 NOTE — Progress Notes (Signed)
Subjective:    Patient ID: Tina Howard, female    DOB: January 20, 1933, 83 y.o.   MRN: [id number]  CC: Tina Howard is a 83 y.o. female who presents today for an acute visit.    HPI: Noticed a little 'lump' on outside of vagina on right buttocks, unchanged. Heading to [facility] and 'wanted to get it looked at'. Patient notes for the first time, she shaved her legs, and between vagina this week. No fever, chills, dysuria. No drainage. Some pain if sitting in certain positions. Noted 'tiny dot' of blood on underwear yesterday, no purulent discharge. hasnt tried any medications.   No h/o boils, mrsa.   No changes in lotions    HISTORY:  Past Medical History:  Diagnosis Date  . Allergic rhinitis   . History of pelvic mass    s/p resection, benign  . Hypercholesteremia   . Hypertension   . Osteoporosis    per pt report on Medical Screening Form   . Shortness of breath dyspnea   . Varicella zoster 2007   Past Surgical History:  Procedure Laterality Date  . BACK SURGERY  1984  . BREAST CYST EXCISION Left    1990s  . CATARACT EXTRACTION Bilateral 2014   Dr. Sandra Cockayne  . FOOT SURGERY    . HERNIA REPAIR  [id number]   Umbillical  . KNEE ARTHROSCOPY     right  . TOTAL KNEE ARTHROPLASTY     left  . VAGINAL HYSTERECTOMY     benign reasons   Family History  Problem Relation Age of Onset  . Heart disease Father   . Breast cancer Maternal Aunt 80    Allergies: Amlodipine and Erythromycin Current Outpatient Medications on File Prior to Visit  Medication Sig Dispense Refill  . acetaminophen (TYLENOL) 650 MG CR tablet Take by mouth.    . Cholecalciferol (VITAMIN D3) 2000 units capsule Take by mouth.    . cyclobenzaprine (FLEXERIL) 5 MG tablet Take by mouth.    . diclofenac sodium (VOLTAREN) 1 % GEL Apply topically.    Marland Kitchen diltiazem (TIAZAC) 120 MG 24 hr capsule TAKE 1 CAPSULE BY MOUTH  ONCE DAILY    . docusate calcium (SURFAK) 240 MG capsule Take by mouth.    . flecainide (TAMBOCOR) 50  MG tablet Take 1 tablet (50 mg total) by mouth 2 (two) times daily. 60 tablet 4  . flecainide (TAMBOCOR) 50 MG tablet TAKE 1 TABLET BY MOUTH TWO  TIMES DAILY    . furosemide (LASIX) 40 MG tablet Take by mouth.    . hydrALAZINE (APRESOLINE) 25 MG tablet Take by mouth.    . losartan (COZAAR) 100 MG tablet Take by mouth.    . potassium chloride (K-DUR,KLOR-CON) 10 MEQ tablet Take by mouth.    . rivaroxaban (XARELTO) 20 MG TABS tablet Take by mouth.    . timolol (TIMOPTIC) 0.5 % ophthalmic solution Apply to eye.     No current facility-administered medications on file prior to visit.     Social History   Tobacco Use  . Smoking status: Never Smoker  . Smokeless tobacco: Never Used  Substance Use Topics  . Alcohol use: Yes    Alcohol/week: 1.2 oz    Types: 2 Glasses of wine per week    Comment: daily  . Drug use: No    Review of Systems  Constitutional: Negative for chills and fever.  Respiratory: Negative for cough.   Cardiovascular: Negative for chest pain and palpitations.  Gastrointestinal: Negative  for nausea and vomiting.  Genitourinary: Negative for dysuria, flank pain, frequency, vaginal bleeding and vaginal discharge.  Skin: Positive for wound.      Objective:    BP 138/74   Pulse 67   Temp 98.4 F (36.9 C) (Oral)   Ht 5\' 1"  (1.549 m)   SpO2 97%   BMI 36.66 kg/m    Physical Exam  Constitutional: She appears well-developed and well-nourished.  Eyes: Conjunctivae are normal.  Cardiovascular: Normal rate, regular rhythm, normal heart sounds and normal pulses.  Pulmonary/Chest: Effort normal and breath sounds normal. She has no wheezes. She has no rhonchi. She has no rales.  Genitourinary:     Genitourinary Comments: Approximately 2 cm localized area swelling,  erythema noted right buttocks as noted on diagram. Nonfluctuant. No purulent discharge. Slightly tender.  Neurological: She is alert.  Skin: Skin is warm and dry.  Psychiatric: She has a normal mood and  affect. Her speech is normal and behavior is normal. Thought content normal.  Vitals reviewed.      Assessment & Plan:   1. Boil of buttock Afebrile. Early Boil/ folliculitis which I suspect from shaving. Discussed with patient based on size of boil, recommend oral antibiotics and warm compresses at this time. It does not respond, patient will return to our clinic for  further evaluation, likely incision and drainage. Return precautions given.  - cephALEXin (KEFLEX) 500 MG capsule; Take 1 capsule (500 mg total) every 6 (six) hours by mouth.  Dispense: 28 capsule; Refill: 0    I am having Tina Howard start on cephALEXin. I am also having her maintain her flecainide, rivaroxaban, diclofenac sodium, losartan, acetaminophen, Vitamin D3, cyclobenzaprine, diltiazem, docusate calcium, flecainide, furosemide, hydrALAZINE, potassium chloride, and timolol.   Meds ordered this encounter  Medications  . cephALEXin (KEFLEX) 500 MG capsule    Sig: Take 1 capsule (500 mg total) every 6 (six) hours by mouth.    Dispense:  28 capsule    Refill:  0    Order Specific Question:   Supervising Provider    Answer:   Crecencio Mc [[id number]]    Return precautions given.   Risks, benefits, and alternatives of the medications and treatment plan prescribed today were discussed, and patient expressed understanding.   Education regarding symptom management and diagnosis given to patient on AVS.  Continue to follow with Tina Hawthorne, FNP for routine health maintenance.   Tina Howard and I agreed with plan.   Mable Paris, FNP

## 2017-11-03 NOTE — Patient Instructions (Signed)
Start Keflex for early signs of boil  Warm compresses Please let me know if not responding to antibiotic  Ensure to take probiotics while on antibiotics and also for 2 weeks after completion. It is important to re-colonize the gut with good bacteria and also to prevent any diarrheal infections associated with antibiotic use.

## 2017-11-04 ENCOUNTER — Encounter: Payer: Medicare Other | Admitting: Primary Care

## 2017-11-08 ENCOUNTER — Ambulatory Visit (INDEPENDENT_AMBULATORY_CARE_PROVIDER_SITE_OTHER): Payer: Medicare Other

## 2017-11-08 VITALS — BP 140/72 | HR 65 | Temp 98.4°F | Resp 14 | Ht 61.0 in | Wt 179.8 lb

## 2017-11-08 DIAGNOSIS — Z1331 Encounter for screening for depression: Secondary | ICD-10-CM

## 2017-11-08 DIAGNOSIS — Z0000 Encounter for general adult medical examination without abnormal findings: Secondary | ICD-10-CM

## 2017-11-08 NOTE — Progress Notes (Signed)
Subjective:   Tina Howard is a 83 y.o. female who presents for Medicare Annual (Subsequent) preventive examination.  Review of Systems:  No ROS.  Medicare Wellness Visit. Additional risk factors are reflected in the social history. Cardiac Risk Factors include: advanced age (>60men, >56 women);hypertension;obesity (BMI >30kg/m2)     Objective:     Vitals: BP 140/72 (BP Location: Left Arm, Patient Position: Sitting, Cuff Size: Normal)   Pulse 65   Temp 98.4 F (36.9 C) (Oral)   Resp 14   Ht 5\' 1"  (1.549 m)   Wt 179 lb 12.8 oz (81.6 kg)   SpO2 97%   BMI 33.97 kg/m   Body mass index is 33.97 kg/m.   Tobacco Social History   Tobacco Use  Smoking Status Never Smoker  Smokeless Tobacco Never Used     Counseling given: Not Answered   Past Medical History:  Diagnosis Date  . Allergic rhinitis   . History of pelvic mass    s/p resection, benign  . Hypercholesteremia   . Hypertension   . Osteoporosis    per pt report on Medical Screening Form   . Shortness of breath dyspnea   . Varicella zoster 2007   Past Surgical History:  Procedure Laterality Date  . BACK SURGERY  1984  . BREAST CYST EXCISION Left    1990s  . CATARACT EXTRACTION Bilateral 2014   Dr. Sandra Cockayne  . FOOT SURGERY    . HERNIA REPAIR  [id number]   Umbillical  . KNEE ARTHROSCOPY     right  . TOTAL KNEE ARTHROPLASTY     left  . VAGINAL HYSTERECTOMY     benign reasons   Family History  Problem Relation Age of Onset  . Emphysema Mother   . Heart disease Father   . Breast cancer Maternal Aunt 80   Social History   Substance and Sexual Activity  Sexual Activity No    Outpatient Encounter Medications as of 11/08/2017  Medication Sig  . acetaminophen (TYLENOL) 650 MG CR tablet Take by mouth.   . cephALEXin (KEFLEX) 500 MG capsule Take 1 capsule (500 mg total) every 6 (six) hours by mouth.  . Cholecalciferol (VITAMIN D3) 2000 units capsule Take by mouth.  . cyclobenzaprine (FLEXERIL) 5 MG  tablet Take by mouth.  . diclofenac sodium (VOLTAREN) 1 % GEL Apply topically.  Marland Kitchen diltiazem (TIAZAC) 120 MG 24 hr capsule TAKE 1 CAPSULE BY MOUTH  ONCE DAILY  . docusate calcium (SURFAK) 240 MG capsule Take by mouth.  . flecainide (TAMBOCOR) 50 MG tablet Take 1 tablet (50 mg total) by mouth 2 (two) times daily.  . furosemide (LASIX) 40 MG tablet Take by mouth.  . hydrALAZINE (APRESOLINE) 25 MG tablet Take by mouth.  . losartan (COZAAR) 100 MG tablet Take by mouth.  . potassium chloride (K-DUR,KLOR-CON) 10 MEQ tablet Take by mouth.  . rivaroxaban (XARELTO) 20 MG TABS tablet Take by mouth.  . timolol (TIMOPTIC) 0.5 % ophthalmic solution Apply to eye.  . [DISCONTINUED] flecainide (TAMBOCOR) 50 MG tablet TAKE 1 TABLET BY MOUTH TWO  TIMES DAILY   No facility-administered encounter medications on file as of 11/08/2017.     Activities of Daily Living In your present state of health, do you have any difficulty performing the following activities: 11/08/2017  Hearing? Y  Comment HOH, hearing aids  Vision? N  Difficulty concentrating or making decisions? Y  Comment Memory delay   Walking or climbing stairs? Y  Dressing or bathing? N  Doing errands, shopping? N  Preparing Food and eating ? N  Using the Toilet? N  In the past six months, have you accidently leaked urine? N  Do you have problems with loss of bowel control? N  Managing your Medications? N  Managing your Finances? N  Housekeeping or managing your Housekeeping? N  Some recent data might be hidden    Patient Care Team: Burnard Hawthorne, FNP as PCP - General (Family Medicine)    Assessment:    This is a routine wellness examination for Tina Howard. The goal of the wellness visit is to assist the patient how to close the gaps in care and create a preventative care plan for the patient.   The roster of all physicians providing medical care to patient is listed in the Snapshot section of the chart.  Taking calcium VIT D as  appropriate/Osteoporosis risk reviewed.    Safety issues reviewed; lives with son, Tina Howard. Smoke and carbon monoxide detectors in the home. No firearms in the home.  Wears seatbelts when driving or riding with others. Patient does wear sunscreen or protective clothing when in direct sunlight. No violence in the home.  Depression- PHQ 2 &9 complete.  No signs/symptoms or verbal communication regarding little pleasure in doing things, feeling down, depressed or hopeless. No changes in sleeping, energy, eating, concentrating.  No thoughts of self harm or harm towards others.  Time spent on this topic is 10 minutes.   Patient is alert, normal appearance, oriented to person/place/and time. Correctly identified the president of the [place], recall of 3/3 words, and performing simple calculations. Displays appropriate judgement and can read correct time from watch face.   No new identified risk were noted.  No failures at ADL's or IADL's.    BMI- discussed the importance of a healthy diet, water intake and the benefits of aerobic exercise. Educational material provided.   24 hour diet recall: Low carb foods  Daily fluid intake: 0 cups of caffeine, 6-8 cups of water  Dental- every 6 months.  Eye- Visual acuity not assessed per patient preference since they have regular follow up with the ophthalmologist.  Wears corrective lenses.  Sleep patterns- Sleeps 6-8 hours at night.  Wakes feeling rested. CPAP in use.  Goals:  Increase water intake, increase physical activity with water aerobics.  Health maintenance gaps- closed.  Patient Concerns: None at this time. Follow up with PCP as needed.  Exercise Activities and Dietary recommendations Current Exercise Habits: Home exercise routine, Type of exercise: stretching(chair and standing exercises), Intensity: Mild  Fall Risk Fall Risk  11/08/2017 09/08/2017 10/25/2016 05/31/2016 11/13/2015  Falls in the past year? Yes No No No Yes  Number falls in  past yr: 1 - - - 1  Injury with Fall? No - - - No  Follow up Education provided;Falls prevention discussed - - - -   Depression Screen PHQ 2/9 Scores 11/08/2017 09/08/2017 10/25/2016 05/31/2016  PHQ - 2 Score 0 0 0 0  PHQ- 9 Score 0 - - -     Cognitive Function MMSE - Mini Mental State Exam 10/25/2016  Orientation to time 5  Orientation to Place 5  Registration 3  Attention/ Calculation 5  Recall 3  Language- name 2 objects 2  Language- repeat 1  Language- follow 3 step command 3  Language- read & follow direction 1  Write a sentence 1  Copy design 1  Total score 30     6CIT Screen 11/08/2017  What Year? 0  points  What month? 0 points  What time? 0 points  Count back from 20 0 points  Months in reverse 0 points  Repeat phrase 0 points  Total Score 0    Immunization History  Administered Date(s) Administered  . Influenza Split 09/24/2011, 10/16/2012  . Influenza, High Dose Seasonal PF 10/20/2017  . Influenza,inj,Quad PF,6+ Mos 10/30/2013, 11/13/2014, 11/13/2015  . Influenza-Unspecified 10/06/2016  . Pneumococcal Conjugate-13 05/08/2014  . Pneumococcal Polysaccharide-23 09/24/2011  . Tdap 06/27/2012   Screening Tests Health Maintenance  Topic Date Due  . TETANUS/TDAP  06/27/2022  . INFLUENZA VACCINE  Completed  . DEXA SCAN  Completed  . PNA vac Low Risk Adult  Completed      Plan:    End of life planning; Advance aging; Advanced directives discussed. Copy of current HCPOA/Living Will requested.    I have personally reviewed and noted the following in the patient's chart:   . Medical and social history . Use of alcohol, tobacco or illicit drugs  . Current medications and supplements . Functional ability and status . Nutritional status . Physical activity . Advanced directives . List of other physicians . Hospitalizations, surgeries, and ER visits in previous 12 months . Vitals . Screenings to include cognitive, depression, and falls . Referrals and  appointments  In addition, I have reviewed and discussed with patient certain preventive protocols, quality metrics, and best practice recommendations. A written personalized care plan for preventive services as well as general preventive health recommendations were provided to patient.     Varney Biles, LPN  [id number]   Agree with plan. Mable Paris, NP

## 2017-11-08 NOTE — Patient Instructions (Addendum)
  Tina Howard , Thank you for taking time to come for your Medicare Wellness Visit. I appreciate your ongoing commitment to your health goals. Please review the following plan we discussed and let me know if I can assist you in the future.   Bring a copy of your [place] and/or Living Will to be scanned into chart.  Have a great day!  These are the goals we discussed: Water aerobics, increase water intake   This is a list of the screening recommended for you and due dates:  Health Maintenance  Topic Date Due  . Tetanus Vaccine  06/27/2022  . Flu Shot  Completed  . DEXA scan (bone density measurement)  Completed  . Pneumonia vaccines  Completed

## 2017-12-01 ENCOUNTER — Encounter: Payer: Self-pay | Admitting: Internal Medicine

## 2017-12-01 ENCOUNTER — Ambulatory Visit (INDEPENDENT_AMBULATORY_CARE_PROVIDER_SITE_OTHER): Payer: Medicare Other | Admitting: Internal Medicine

## 2017-12-01 VITALS — BP 146/84 | HR 81 | Temp 98.4°F | Ht 61.0 in | Wt 194.1 lb

## 2017-12-01 DIAGNOSIS — E559 Vitamin D deficiency, unspecified: Secondary | ICD-10-CM | POA: Diagnosis not present

## 2017-12-01 DIAGNOSIS — I10 Essential (primary) hypertension: Secondary | ICD-10-CM

## 2017-12-01 DIAGNOSIS — M545 Low back pain: Secondary | ICD-10-CM | POA: Diagnosis not present

## 2017-12-01 DIAGNOSIS — N898 Other specified noninflammatory disorders of vagina: Secondary | ICD-10-CM

## 2017-12-01 DIAGNOSIS — E669 Obesity, unspecified: Secondary | ICD-10-CM

## 2017-12-01 DIAGNOSIS — M8580 Other specified disorders of bone density and structure, unspecified site: Secondary | ICD-10-CM | POA: Diagnosis not present

## 2017-12-01 DIAGNOSIS — I480 Paroxysmal atrial fibrillation: Secondary | ICD-10-CM | POA: Diagnosis not present

## 2017-12-01 DIAGNOSIS — Z1329 Encounter for screening for other suspected endocrine disorder: Secondary | ICD-10-CM

## 2017-12-01 DIAGNOSIS — Z13220 Encounter for screening for lipoid disorders: Secondary | ICD-10-CM | POA: Diagnosis not present

## 2017-12-01 DIAGNOSIS — E876 Hypokalemia: Secondary | ICD-10-CM

## 2017-12-01 DIAGNOSIS — G8929 Other chronic pain: Secondary | ICD-10-CM

## 2017-12-01 LAB — CBC
HEMATOCRIT: 38.9 % (ref 36.0–46.0)
HEMOGLOBIN: 12.9 g/dL (ref 12.0–15.0)
MCHC: 33.1 g/dL (ref 30.0–36.0)
MCV: 83.7 fl (ref 78.0–100.0)
PLATELETS: 258 10*3/uL (ref 150.0–400.0)
RBC: 4.65 Mil/uL (ref 3.87–5.11)
RDW: 14.3 % (ref 11.5–15.5)
WBC: 6.7 10*3/uL (ref 4.0–10.5)

## 2017-12-01 LAB — LIPID PANEL
CHOL/HDL RATIO: 4
Cholesterol: 217 mg/dL — ABNORMAL HIGH (ref 0–200)
HDL: 61.6 mg/dL (ref >39.00)
LDL Cholesterol: 128 mg/dL — ABNORMAL HIGH (ref 0–99)
NONHDL: 155.24
Triglycerides: 136 mg/dL (ref 0.0–149.0)
VLDL: 27.2 mg/dL (ref 0.0–40.0)

## 2017-12-01 LAB — COMPREHENSIVE METABOLIC PANEL
ALT: 14 U/L (ref 0–35)
AST: 14 U/L (ref 0–37)
Albumin: 4.2 g/dL (ref 3.5–5.2)
Alkaline Phosphatase: 69 U/L (ref 39–117)
BUN: 16 mg/dL (ref 6–23)
CHLORIDE: 102 meq/L (ref 96–112)
CO2: 31 meq/L (ref 19–32)
Calcium: 8.9 mg/dL (ref 8.4–10.5)
Creatinine, Ser: 0.52 mg/dL (ref 0.40–1.20)
GFR: 119.83 mL/min (ref >60.00)
GLUCOSE: 108 mg/dL — AB (ref 70–99)
POTASSIUM: 4 meq/L (ref 3.5–5.1)
Sodium: 140 mEq/L (ref 135–145)
Total Bilirubin: 0.6 mg/dL (ref 0.2–1.2)
Total Protein: 6.7 g/dL (ref 6.0–8.3)

## 2017-12-01 LAB — TSH: TSH: 1.99 u[IU]/mL (ref 0.35–4.50)

## 2017-12-01 LAB — VITAMIN D 25 HYDROXY (VIT D DEFICIENCY, FRACTURES): VITD: 43.17 ng/mL (ref 30.00–100.00)

## 2017-12-01 MED ORDER — HYDRALAZINE HCL 25 MG PO TABS: 25.0000 mg | ORAL_TABLET | Freq: Three times a day (TID) | ORAL | 1 refills | Status: DC

## 2017-12-01 NOTE — Progress Notes (Signed)
Chief Complaint  Patient presents with  . Follow-up   Follow up  1. HTN BP 148/86 today on Dilt XL 120, Lasix 40 mg qd, Hydralazine 25, Losartan 100 mg qd will recheck  2. F/u inflamed cyst to right lower perineal region took keflex x 7 days and can no longer feel the area.   3. Chronic low back pain s/p surgery taking Tylenol arthritis at least 1x per day and helping     Review of Systems  Eyes:       +left lower eyelid lesion   Respiratory: Negative for shortness of breath.   Cardiovascular: Negative for chest pain.  Gastrointestinal: Negative for abdominal pain.  Musculoskeletal: Positive for back pain.  Skin:       +skin boil pt thinks improved    Past Medical History:  Diagnosis Date  . Allergic rhinitis   . History of pelvic mass    s/p resection, benign  . Hypercholesteremia   . Hypertension   . Osteoporosis    per pt report on Medical Screening Form   . Shortness of breath dyspnea   . Varicella zoster 2007   Past Surgical History:  Procedure Laterality Date  . BACK SURGERY  1984  . BREAST CYST EXCISION Left    1990s  . CATARACT EXTRACTION Bilateral 2014   Dr. Sandra Cockayne  . ELECTROPHYSIOLOGIC STUDY N/A 06/15/2016   Procedure: CARDIOVERSION;  Surgeon: [name], MD;  Location: [facility] ORS;  Service: Cardiovascular;  Laterality: N/A;  . EYE SURGERY     cataract  . FOOT SURGERY    . HERNIA REPAIR  5053   Umbillical  . KNEE ARTHROSCOPY     right  . TOTAL KNEE ARTHROPLASTY     left  . VAGINAL HYSTERECTOMY     benign reasons   Family History  Problem Relation Age of Onset  . Emphysema Mother   . Heart disease Father   . Breast cancer Maternal Aunt 80   Social History   Socioeconomic History  . Marital status: Single    Spouse name: Not on file  . Number of children: Not on file  . Years of education: Not on file  . Highest education level: Not on file  Social Needs  . Financial resource strain: Not on file  . Food insecurity - worry: Not on  file  . Food insecurity - inability: Not on file  . Transportation needs - medical: Not on file  . Transportation needs - non-medical: Not on file  Occupational History  . Not on file  Tobacco Use  . Smoking status: Never Smoker  . Smokeless tobacco: Never Used  Substance and Sexual Activity  . Alcohol use: Yes    Alcohol/week: 1.2 oz    Types: 2 Glasses of wine per week    Comment: daily  . Drug use: No  . Sexual activity: No  Other Topics Concern  . Not on file  Social History Narrative   Widowed.   Very active within her community.   Enjoys singing, being active in her church, spending time with family.   Current Meds  Medication Sig  . amoxicillin (AMOXIL) 500 MG tablet Take 500 mg by mouth once.  . Cholecalciferol (VITAMIN D3) 2000 units capsule Take by mouth.  . cyclobenzaprine (FLEXERIL) 5 MG tablet Take by mouth.  . diclofenac sodium (VOLTAREN) 1 % GEL Apply topically.  Marland Kitchen diltiazem (TIAZAC) 120 MG 24 hr capsule TAKE 1 CAPSULE BY MOUTH  ONCE DAILY  .  docusate calcium (SURFAK) 240 MG capsule Take by mouth.  . flecainide (TAMBOCOR) 50 MG tablet Take 1 tablet (50 mg total) by mouth 2 (two) times daily.  . furosemide (LASIX) 40 MG tablet Take by mouth.  . hydrALAZINE (APRESOLINE) 25 MG tablet Take by mouth.  . losartan (COZAAR) 100 MG tablet Take by mouth.  . potassium chloride (K-DUR,KLOR-CON) 10 MEQ tablet Take by mouth.  . rivaroxaban (XARELTO) 20 MG TABS tablet Take by mouth.  . timolol (TIMOPTIC) 0.5 % ophthalmic solution Apply to eye.   Allergies  Allergen Reactions  . Amlodipine Swelling  . Erythromycin Nausea And Vomiting   Recent Results (from the past 2160 hour(s))  Basic metabolic panel     Status: Abnormal   Collection Time: 09/08/17 10:04 AM  Result Value Ref Range   Sodium 141 135 - 145 mEq/L   Potassium 3.8 3.5 - 5.1 mEq/L   Chloride 102 96 - 112 mEq/L   CO2 32 19 - 32 mEq/L   Glucose, Bld 113 (H) 70 - 99 mg/dL   BUN 16 6 - 23 mg/dL    Creatinine, Ser 0.60 0.40 - 1.20 mg/dL   Calcium 9.0 8.4 - 10.5 mg/dL   GFR 101.65 >60.00 mL/min  Hemoglobin A1c     Status: None   Collection Time: 09/08/17 10:04 AM  Result Value Ref Range   Hgb A1c MFr Bld 5.8 4.6 - 6.5 %    Comment: Glycemic Control Guidelines for People with Diabetes:Non Diabetic:  <6%Goal of Therapy: <7%Additional Action Suggested:  >8%    Objective  Body mass index is 36.68 kg/m. Wt Readings from Last 3 Encounters:  12/01/17 194 lb 2 oz (88.1 kg)  11/08/17 179 lb 12.8 oz (81.6 kg)  09/08/17 194 lb (88 kg)   Temp Readings from Last 3 Encounters:  12/01/17 98.4 F (36.9 C) (Oral)  11/08/17 98.4 F (36.9 C) (Oral)  11/03/17 98.4 F (36.9 C) (Oral)   BP Readings from Last 3 Encounters:  12/01/17 (!) 148/86  11/08/17 140/72  11/03/17 138/74   Pulse Readings from Last 3 Encounters:  12/01/17 81  11/08/17 65  11/03/17 67   Pulse oximetry on room air is 146/84  Physical Exam  Constitutional: She is oriented to person, place, and time and well-developed, well-nourished, and in no distress.  HENT:  Head: Normocephalic and atraumatic.  Mouth/Throat: Oropharynx is clear and moist and mucous membranes are normal.  Eyes: Conjunctivae are normal. Pupils are equal, round, and reactive to light.    Cardiovascular: Normal rate, regular rhythm and normal heart sounds.  Neg leg edema b/l  Appears in NSR today   Pulmonary/Chest: Effort normal and breath sounds normal.  Abdominal: Soft. Bowel sounds are normal.  Neurological: She is alert and oriented to person, place, and time. She has normal motor skills. Gait normal. Gait normal.  Skin: Skin is warm and dry.     Psychiatric: Mood, memory, affect and judgment normal.  Nursing note and vitals reviewed.  Assessment   1. HTN uncontrolled 148/86 repeat 146/84  2. Vaginal cyst, right healing  3. H/o osteopenia  4. H/o hypokalemia  5.HM Plan  1.  Cont other meds  Increase hydralazine 25 bid to tid  F/u  in 1 month  Labs today CMET, CBC, lipid, vti D, TSH UA had 04/06/17  Pt pt Dr. Erin Fulling cards took her off calcium and cholesterol med  2. Cont warm compress check at f/u  No need for abx currently  3. Check  vit D level  4. Labs today  5.  UTD vaccines  Disc shingrix in future   Sees Dermatology Dr. Nehemiah Massed  colonoscpy had 11/2013 IH, angioectasia not bleeding  mammo neg 07/19/17  No need for pap  DEXA had 05/29/15 osteopenia on vit D not currently on calcium consider repeat DEXA 05/2018   Provider: Dr. Olivia Mackie McLean-Scocuzza

## 2017-12-01 NOTE — Patient Instructions (Addendum)
Please follow up in 1 month to recheck Blood pressure sooner if needed  Increased your hydralazine to 25 mg 3x per day  Happy Holidays  Try a warm compress to the right lower private area 1-2 x per day x 2 more weeks   Hypertension Hypertension, commonly called high blood pressure, is when the force of blood pumping through the arteries is too strong. The arteries are the blood vessels that carry blood from the heart throughout the body. Hypertension forces the heart to work harder to pump blood and may cause arteries to become narrow or stiff. Having untreated or uncontrolled hypertension can cause heart attacks, strokes, kidney disease, and other problems. A blood pressure reading consists of a higher number over a lower number. Ideally, your blood pressure should be below 120/80. The first ("top") number is called the systolic pressure. It is a measure of the pressure in your arteries as your heart beats. The second ("bottom") number is called the diastolic pressure. It is a measure of the pressure in your arteries as the heart relaxes. What are the causes? The cause of this condition is not known. What increases the risk? Some risk factors for high blood pressure are under your control. Others are not. Factors you can change  Smoking.  Having type 2 diabetes mellitus, high cholesterol, or both.  Not getting enough exercise or physical activity.  Being overweight.  Having too much fat, sugar, calories, or salt (sodium) in your diet.  Drinking too much alcohol. Factors that are difficult or impossible to change  Having chronic kidney disease.  Having a family history of high blood pressure.  Age. Risk increases with age.  Race. You may be at higher risk if you are African-American.  Gender. Men are at higher risk than women before age 24. After age 94, women are at higher risk than men.  Having obstructive sleep apnea.  Stress. What are the signs or symptoms? Extremely  high blood pressure (hypertensive crisis) may cause:  Headache.  Anxiety.  Shortness of breath.  Nosebleed.  Nausea and vomiting.  Severe chest pain.  Jerky movements you cannot control (seizures).  How is this diagnosed? This condition is diagnosed by measuring your blood pressure while you are seated, with your arm resting on a surface. The cuff of the blood pressure monitor will be placed directly against the skin of your upper arm at the level of your heart. It should be measured at least twice using the same arm. Certain conditions can cause a difference in blood pressure between your right and left arms. Certain factors can cause blood pressure readings to be lower or higher than normal (elevated) for a short period of time:  When your blood pressure is higher when you are in a health care provider's office than when you are at home, this is called white coat hypertension. Most people with this condition do not need medicines.  When your blood pressure is higher at home than when you are in a health care provider's office, this is called masked hypertension. Most people with this condition may need medicines to control blood pressure.  If you have a high blood pressure reading during one visit or you have normal blood pressure with other risk factors:  You may be asked to return on a different day to have your blood pressure checked again.  You may be asked to monitor your blood pressure at home for 1 week or longer.  If you are diagnosed with hypertension,  you may have other blood or imaging tests to help your health care provider understand your overall risk for other conditions. How is this treated? This condition is treated by making healthy lifestyle changes, such as eating healthy foods, exercising more, and reducing your alcohol intake. Your health care provider may prescribe medicine if lifestyle changes are not enough to get your blood pressure under control, and  if:  Your systolic blood pressure is above 130.  Your diastolic blood pressure is above 80.  Your personal target blood pressure may vary depending on your medical conditions, your age, and other factors. Follow these instructions at home: Eating and drinking  Eat a diet that is high in fiber and potassium, and low in sodium, added sugar, and fat. An example eating plan is called the DASH (Dietary Approaches to Stop Hypertension) diet. To eat this way: ? Eat plenty of fresh fruits and vegetables. Try to fill half of your plate at each meal with fruits and vegetables. ? Eat whole grains, such as whole wheat pasta, brown rice, or whole grain bread. Fill about one quarter of your plate with whole grains. ? Eat or drink low-fat dairy products, such as skim milk or low-fat yogurt. ? Avoid fatty cuts of meat, processed or cured meats, and poultry with skin. Fill about one quarter of your plate with lean proteins, such as fish, chicken without skin, beans, eggs, and tofu. ? Avoid premade and processed foods. These tend to be higher in sodium, added sugar, and fat.  Reduce your daily sodium intake. Most people with hypertension should eat less than 1,500 mg of sodium a day.  Limit alcohol intake to no more than 1 drink a day for nonpregnant women and 2 drinks a day for men. One drink equals 12 oz of beer, 5 oz of wine, or 1 oz of hard liquor. Lifestyle  Work with your health care provider to maintain a healthy body weight or to lose weight. Ask what an ideal weight is for you.  Get at least 30 minutes of exercise that causes your heart to beat faster (aerobic exercise) most days of the week. Activities may include walking, swimming, or biking.  Include exercise to strengthen your muscles (resistance exercise), such as pilates or lifting weights, as part of your weekly exercise routine. Try to do these types of exercises for 30 minutes at least 3 days a week.  Do not use any products that contain  nicotine or tobacco, such as cigarettes and e-cigarettes. If you need help quitting, ask your health care provider.  Monitor your blood pressure at home as told by your health care provider.  Keep all follow-up visits as told by your health care provider. This is important. Medicines  Take over-the-counter and prescription medicines only as told by your health care provider. Follow directions carefully. Blood pressure medicines must be taken as prescribed.  Do not skip doses of blood pressure medicine. Doing this puts you at risk for problems and can make the medicine less effective.  Ask your health care provider about side effects or reactions to medicines that you should watch for. Contact a health care provider if:  You think you are having a reaction to a medicine you are taking.  You have headaches that keep coming back (recurring).  You feel dizzy.  You have swelling in your ankles.  You have trouble with your vision. Get help right away if:  You develop a severe headache or confusion.  You have unusual  weakness or numbness.  You feel faint.  You have severe pain in your chest or abdomen.  You vomit repeatedly.  You have trouble breathing. Summary  Hypertension is when the force of blood pumping through your arteries is too strong. If this condition is not controlled, it may put you at risk for serious complications.  Your personal target blood pressure may vary depending on your medical conditions, your age, and other factors. For most people, a normal blood pressure is less than 120/80.  Hypertension is treated with lifestyle changes, medicines, or a combination of both. Lifestyle changes include weight loss, eating a healthy, low-sodium diet, exercising more, and limiting alcohol. This information is not intended to replace advice given to you by your health care provider. Make sure you discuss any questions you have with your health care provider. Document  Released: 12/13/2005 Document Revised: 11/10/2016 Document Reviewed: 11/10/2016 Elsevier Interactive Patient Education  2018 [employer]  Folliculitis Folliculitis is inflammation of the hair follicles. Folliculitis most commonly occurs on the scalp, thighs, legs, back, and buttocks. However, it can occur anywhere on the body. What are the causes? This condition may be caused by:  A bacterial infection (common).  A fungal infection.  A viral infection.  Coming into contact with certain chemicals, especially oils and tars.  Shaving or waxing.  Applying greasy ointments or creams to your skin often.  Long-lasting folliculitis and folliculitis that keeps coming back can be caused by bacteria that live in the nostrils. What increases the risk? This condition is more likely to develop in people with:  A weakened immune system.  Diabetes.  Obesity.  What are the signs or symptoms? Symptoms of this condition include:  Redness.  Soreness.  Swelling.  Itching.  Small white or yellow, pus-filled, itchy spots (pustules) that appear over a reddened area. If there is an infection that goes deep into the follicle, these may develop into a boil (furuncle).  A group of closely packed boils (carbuncle). These tend to form in hairy, sweaty areas of the body.  How is this diagnosed? This condition is diagnosed with a skin exam. To find what is causing the condition, your health care provider may take a sample of one of the pustules or boils for testing. How is this treated? This condition may be treated by:  Applying warm compresses to the affected areas.  Taking an antibiotic medicine or applying an antibiotic medicine to the skin.  Applying or bathing with an antiseptic solution.  Taking an over-the-counter medicine to help with itching.  Having a procedure to drain any pustules or boils. This may be done if a pustule or boil contains a lot of pus or fluid.  Laser hair  removal. This may be done to treat long-lasting folliculitis.  Follow these instructions at home:  If directed, apply heat to the affected area as often as told by your health care provider. Use the heat source that your health care provider recommends, such as a moist heat pack or a heating pad. ? Place a towel between your skin and the heat source. ? Leave the heat on for 20-30 minutes. ? Remove the heat if your skin turns bright red. This is especially important if you are unable to feel pain, heat, or cold. You may have a greater risk of getting burned.  If you were prescribed an antibiotic medicine, use it as told by your health care provider. Do not stop using the antibiotic even if you start to  feel better.  Take over-the-counter and prescription medicines only as told by your health care provider.  Do not shave irritated skin.  Keep all follow-up visits as told by your health care provider. This is important. Get help right away if:  You have more redness, swelling, or pain in the affected area.  Red streaks are spreading from the affected area.  You have a fever. This information is not intended to replace advice given to you by your health care provider. Make sure you discuss any questions you have with your health care provider. Document Released: 02/21/2002 Document Revised: 07/02/2016 Document Reviewed: 10/03/2015 Elsevier Interactive Patient Education  2018 [employer].

## 2017-12-02 ENCOUNTER — Telehealth: Payer: Self-pay | Admitting: Family

## 2017-12-02 NOTE — Telephone Encounter (Signed)
Copied from [place] [id number]. Topic: Quick Communication - Lab Results >> Dec 02, 2017  5:22 PM Cecelia Byars, NT wrote: Patient called for lab results to given  ,patient she will go to my chart for results

## 2017-12-08 ENCOUNTER — Other Ambulatory Visit: Payer: Self-pay | Admitting: Internal Medicine

## 2017-12-08 DIAGNOSIS — I10 Essential (primary) hypertension: Secondary | ICD-10-CM

## 2017-12-08 DIAGNOSIS — E785 Hyperlipidemia, unspecified: Secondary | ICD-10-CM

## 2017-12-08 MED ORDER — PRAVASTATIN SODIUM 20 MG PO TABS: 20.0000 mg | ORAL_TABLET | Freq: Every day | ORAL | 3 refills | Status: DC

## 2017-12-08 MED ORDER — HYDRALAZINE HCL 25 MG PO TABS: 25.0000 mg | ORAL_TABLET | Freq: Three times a day (TID) | ORAL | 1 refills | Status: DC

## 2017-12-10 DIAGNOSIS — J029 Acute pharyngitis, unspecified: Secondary | ICD-10-CM | POA: Diagnosis not present

## 2017-12-12 ENCOUNTER — Ambulatory Visit: Payer: Medicare Other | Admitting: Family

## 2017-12-29 ENCOUNTER — Ambulatory Visit (INDEPENDENT_AMBULATORY_CARE_PROVIDER_SITE_OTHER): Payer: Medicare Other | Admitting: Internal Medicine

## 2017-12-29 ENCOUNTER — Encounter: Payer: Self-pay | Admitting: Internal Medicine

## 2017-12-29 VITALS — BP 128/78 | HR 72 | Temp 97.7°F | Wt 196.0 lb

## 2017-12-29 DIAGNOSIS — R011 Cardiac murmur, unspecified: Secondary | ICD-10-CM | POA: Diagnosis not present

## 2017-12-29 DIAGNOSIS — I480 Paroxysmal atrial fibrillation: Secondary | ICD-10-CM | POA: Diagnosis not present

## 2017-12-29 DIAGNOSIS — R0602 Shortness of breath: Secondary | ICD-10-CM | POA: Insufficient documentation

## 2017-12-29 DIAGNOSIS — R339 Retention of urine, unspecified: Secondary | ICD-10-CM

## 2017-12-29 DIAGNOSIS — E785 Hyperlipidemia, unspecified: Secondary | ICD-10-CM

## 2017-12-29 DIAGNOSIS — I340 Nonrheumatic mitral (valve) insufficiency: Secondary | ICD-10-CM

## 2017-12-29 DIAGNOSIS — I10 Essential (primary) hypertension: Secondary | ICD-10-CM | POA: Diagnosis not present

## 2017-12-29 NOTE — Patient Instructions (Addendum)
Please follow up in 2-3 months sooner if needed  We will order echo and Chest Xray at [facility] white/unscented soap in private area  Use Talc free sprays and powders in private area  Try Zeasorb AF Powder in your skin folds Consider shingrix vaccine for shingles  Acute Urinary Retention, Female Urinary retention means you are unable to pee completely or at all (empty your bladder). Follow these instructions at home:  Drink enough fluids to keep your pee (urine) clear or pale yellow.  If you are sent home with a tube that drains the bladder (catheter), there will be a drainage bag attached to it. There are two types of bags. One is big that you can wear at night without having to empty it. One is smaller and needs to be emptied more often. ? Keep the drainage bag emptied. ? Keep the drainage bag lower than the tube.  Only take medicine as told by your doctor. Contact a doctor if:  You have a low-grade fever.  You have spasms or you are leaking pee when you have spasms. Get help right away if:  You have chills or a fever.  Your catheter stops draining pee.  Your catheter falls out.  You have increased bleeding that does not stop after you have rested and increased the amount of fluids you had been drinking. This information is not intended to replace advice given to you by your health care provider. Make sure you discuss any questions you have with your health care provider. Document Released: 05/31/2008 Document Revised: 05/20/2016 Document Reviewed: 05/24/2013 Elsevier Interactive Patient Education  2017 [employer].

## 2017-12-29 NOTE — Progress Notes (Addendum)
Chief Complaint  Patient presents with  . Follow-up   F/u  1. HTN improved  2. Right vaginal cyst resolved  3. C/o bladder retention and frequency and at times not totally emptying bladder, denies dysuria. She reports h/o bladder prolapse s/p suspension procedure and she has had 4 kids. She also has h/o pyelonephritis  4. She reports h/o hoarse voice 12/10/17 and she went to urgent care and was given prednisone x 12 days. Voice is still hoarse. 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  5. C/o SOB with walking and talking echo 05/25/16 with mild to moderate MR. Will repeat echo to f/u. She also has h/o Afib f/u with Dr. Nehemiah Massed rate controlled today.  6. She asks about grapefruit juice advised not to take due to interaction with multiple medications  7. She asks about soap to use in private area advised Dove unscented or white she already uses [place]. She asks about feminine soap this will be okay as well  8. Intertrigo using [employer] rec she use Zeasorb AF in skin folds but not directly in private area.    Review of Systems  Constitutional: Negative for weight loss.  HENT: Negative for hearing loss.   Eyes:       No vision problems   Respiratory: Positive for shortness of breath.        With exertion or walking   Cardiovascular: Negative for chest pain and palpitations.  Gastrointestinal: Negative for abdominal pain.  Genitourinary: Positive for frequency and urgency. Negative for dysuria.  Musculoskeletal: Positive for back pain.  Skin: Negative for rash.  Psychiatric/Behavioral: Positive for memory loss.   Past Medical History:  Diagnosis Date  . Allergic rhinitis   . History of pelvic mass    s/p resection, benign  . Hypercholesteremia   . Hypertension   . Mitral regurgitation    noted echo 04/2016   . Osteoporosis    per pt report on Medical Screening Form   . Shortness of breath dyspnea   . Varicella zoster 2007   Past Surgical History:   Procedure Laterality Date  . BACK SURGERY  1984  . BREAST CYST EXCISION Left    1990s  . CATARACT EXTRACTION Bilateral 2014   Dr. Sandra Cockayne  . ELECTROPHYSIOLOGIC STUDY N/A 06/15/2016   Procedure: CARDIOVERSION;  Surgeon: [name], MD;  Location: [facility] ORS;  Service: Cardiovascular;  Laterality: N/A;  . EYE SURGERY     cataract  . FOOT SURGERY    . HERNIA REPAIR  3614   Umbillical  . KNEE ARTHROSCOPY     right  . TOTAL KNEE ARTHROPLASTY     left  . VAGINAL HYSTERECTOMY     benign reasons   Family History  Problem Relation Age of Onset  . Emphysema Mother   . Heart disease Father   . Breast cancer Maternal Aunt 80   Social History   Socioeconomic History  . Marital status: Single    Spouse name: Not on file  . Number of children: Not on file  . Years of education: Not on file  . Highest education level: Not on file  Social Needs  . Financial resource strain: Not on file  . Food insecurity - worry: Not on file  . Food insecurity - inability: Not on file  . Transportation needs - medical: Not on file  . Transportation needs - non-medical: Not on file  Occupational History  . Not on file  Tobacco  Use  . Smoking status: Never Smoker  . Smokeless tobacco: Never Used  Substance and Sexual Activity  . Alcohol use: Yes    Alcohol/week: 1.2 oz    Types: 2 Glasses of wine per week    Comment: daily  . Drug use: No  . Sexual activity: No  Other Topics Concern  . Not on file  Social History Narrative   Widowed.   Very active within her community.   Enjoys singing, being active in her church, spending time with family.   Current Meds  Medication Sig  . amoxicillin (AMOXIL) 500 MG tablet Take 500 mg by mouth once.  . Cholecalciferol (VITAMIN D3) 2000 units capsule Take by mouth.  . diclofenac sodium (VOLTAREN) 1 % GEL Apply topically.  Marland Kitchen diltiazem (TIAZAC) 120 MG 24 hr capsule TAKE 1 CAPSULE BY MOUTH  ONCE DAILY  . flecainide (TAMBOCOR) 50 MG tablet Take 1  tablet (50 mg total) by mouth 2 (two) times daily.  . furosemide (LASIX) 40 MG tablet Take by mouth.  . hydrALAZINE (APRESOLINE) 25 MG tablet Take 1 tablet (25 mg total) by mouth 3 (three) times daily.  Marland Kitchen losartan (COZAAR) 100 MG tablet Take by mouth.  . potassium chloride (K-DUR,KLOR-CON) 10 MEQ tablet Take by mouth.  . pravastatin (PRAVACHOL) 20 MG tablet Take 1 tablet (20 mg total) by mouth daily.  . rivaroxaban (XARELTO) 20 MG TABS tablet Take by mouth.  . timolol (TIMOPTIC) 0.5 % ophthalmic solution Apply to eye.   Allergies  Allergen Reactions  . Amlodipine Swelling  . Erythromycin Nausea And Vomiting   Recent Results (from the past 2160 hour(s))  Comprehensive metabolic panel     Status: Abnormal   Collection Time: 12/01/17 10:56 AM  Result Value Ref Range   Sodium 140 135 - 145 mEq/L   Potassium 4.0 3.5 - 5.1 mEq/L   Chloride 102 96 - 112 mEq/L   CO2 31 19 - 32 mEq/L   Glucose, Bld 108 (H) 70 - 99 mg/dL   BUN 16 6 - 23 mg/dL   Creatinine, Ser 0.52 0.40 - 1.20 mg/dL   Total Bilirubin 0.6 0.2 - 1.2 mg/dL   Alkaline Phosphatase 69 39 - 117 U/L   AST 14 0 - 37 U/L   ALT 14 0 - 35 U/L   Total Protein 6.7 6.0 - 8.3 g/dL   Albumin 4.2 3.5 - 5.2 g/dL   Calcium 8.9 8.4 - 10.5 mg/dL   GFR 119.83 >60.00 mL/min  CBC     Status: None   Collection Time: 12/01/17 10:56 AM  Result Value Ref Range   WBC 6.7 4.0 - 10.5 K/uL   RBC 4.65 3.87 - 5.11 Mil/uL   Platelets 258.0 150.0 - 400.0 K/uL   Hemoglobin 12.9 12.0 - 15.0 g/dL   HCT 38.9 36.0 - 46.0 %   MCV 83.7 78.0 - 100.0 fl   MCHC 33.1 30.0 - 36.0 g/dL   RDW 14.3 11.5 - 15.5 %  Lipid panel     Status: Abnormal   Collection Time: 12/01/17 10:56 AM  Result Value Ref Range   Cholesterol 217 (H) 0 - 200 mg/dL    Comment: ATP III Classification       Desirable:  < 200 mg/dL               Borderline High:  200 - 239 mg/dL          High:  > = 240 mg/dL   Triglycerides 136.0 0.0 -  149.0 mg/dL    Comment: Normal:  <150 mg/dLBorderline  High:  150 - 199 mg/dL   HDL 61.60 >39.00 mg/dL   VLDL 27.2 0.0 - 40.0 mg/dL   LDL Cholesterol 128 (H) 0 - 99 mg/dL   Total CHOL/HDL Ratio 4     Comment:                Men          Women1/2 Average Risk     3.4          3.3Average Risk          5.0          4.42X Average Risk          9.6          7.13X Average Risk          15.0          11.0                       NonHDL 155.24     Comment: NOTE:  Non-HDL goal should be 30 mg/dL higher than patient's LDL goal (i.e. LDL goal of < 70 mg/dL, would have non-HDL goal of < 100 mg/dL)  TSH     Status: None   Collection Time: 12/01/17 10:56 AM  Result Value Ref Range   TSH 1.99 0.35 - 4.50 uIU/mL  VITAMIN D 25 Hydroxy (Vit-D Deficiency, Fractures)     Status: None   Collection Time: 12/01/17 10:56 AM  Result Value Ref Range   VITD 43.17 30.00 - 100.00 ng/mL   Objective  Body mass index is 37.03 kg/m. Wt Readings from Last 3 Encounters:  12/29/17 196 lb (88.9 kg)  12/01/17 194 lb 2 oz (88.1 kg)  11/08/17 179 lb 12.8 oz (81.6 kg)   Temp Readings from Last 3 Encounters:  12/29/17 97.7 F (36.5 C) (Oral)  12/01/17 98.4 F (36.9 C) (Oral)  11/08/17 98.4 F (36.9 C) (Oral)   BP Readings from Last 3 Encounters:  12/29/17 128/78  12/01/17 (!) 146/84  11/08/17 140/72   Pulse Readings from Last 3 Encounters:  12/29/17 72  12/01/17 81  11/08/17 65   O2 sat room air 98% Physical Exam  Constitutional: She is oriented to person, place, and time and well-developed, well-nourished, and in no distress.  HENT:  Head: Normocephalic and atraumatic.  Eyes: Conjunctivae are normal. Pupils are equal, round, and reactive to light.  Cardiovascular: Normal rate and regular rhythm.  Murmur heard. Pulmonary/Chest: Effort normal and breath sounds normal.  Neurological: She is alert and oriented to person, place, and time. Gait normal.  Skin: Skin is warm and dry.  Psychiatric: Mood, memory, affect and judgment normal.  Nursing note and vitals  reviewed.   Assessment   1. HTN controlled/HLD 2. Vaginal cyst resolved  3. Bladder retention, frequency, urgency r/o UTI  4. Laryngitis  5. SOB with walking, talking with h/o mild to mod MR with w/u cardiac/pulm etiology, h/o heart murmur, h/o Afib today in NSR 6. Intertrigo  7. Hm  Plan  1. Cont meds  rec statin qhs   2. NTD  3. UA and urine culture pt to bring back  If neg refer to urogyn  4. Given pt info about laryngitis if not better refer to ENT Voice rest  Tea with honey and lemon  Reassess at f/u  5. Order echo and CXR [facility] F/u cardiology if abnormal  6.  rec Zeasorb AF powder  7.  UTD vaccines  rec shingrix vaccine  will disc repeat pna 23 vaccine in future has been 5 years   Colonoscopy had 12/24/13 consider cologuard if candidate in future  mammo had 07/19/17  Out of pap window  DEXA osteopenia on vit D nl vit D will rec calcium 600 mg bid in future   Reviewed Dr. Roland Rack notes 06/05/18 right shoulder pain and stiffness rec surgery c/w rotator cuff etiology rec right total shoulder arthroplasty   Provider: Dr. Olivia Mackie McLean-Scocuzza-Internal Medicine

## 2017-12-30 ENCOUNTER — Other Ambulatory Visit (INDEPENDENT_AMBULATORY_CARE_PROVIDER_SITE_OTHER): Payer: Medicare Other

## 2017-12-30 DIAGNOSIS — R339 Retention of urine, unspecified: Secondary | ICD-10-CM

## 2017-12-30 LAB — URINALYSIS, ROUTINE W REFLEX MICROSCOPIC
Bilirubin Urine: NEGATIVE
HGB URINE DIPSTICK: NEGATIVE
Ketones, ur: NEGATIVE
NITRITE: NEGATIVE
RBC / HPF: NONE SEEN (ref 0–?)
Specific Gravity, Urine: 1.02 (ref 1.000–1.030)
Total Protein, Urine: NEGATIVE
Urine Glucose: NEGATIVE
Urobilinogen, UA: 0.2 (ref 0.0–1.0)
pH: 6 (ref 5.0–8.0)

## 2017-12-31 LAB — URINE CULTURE
MICRO NUMBER: 90014851
SPECIMEN QUALITY:: ADEQUATE

## 2018-01-02 NOTE — Telephone Encounter (Signed)
Pt. called for results of urinalysis. No result note was forwarded to [facility] NT. Given results per review of Dr. Aundra Dubin.  Verb. understanding.  Denies any urinary symptoms.

## 2018-01-11 ENCOUNTER — Ambulatory Visit
Admission: RE | Admit: 2018-01-11 | Discharge: 2018-01-11 | Disposition: A | Discharge status: Home / Self Care | Payer: Medicare Other | Source: Ambulatory Visit | Attending: Internal Medicine | Admitting: Internal Medicine

## 2018-01-11 DIAGNOSIS — R0602 Shortness of breath: Secondary | ICD-10-CM

## 2018-01-11 DIAGNOSIS — I340 Nonrheumatic mitral (valve) insufficiency: Secondary | ICD-10-CM

## 2018-01-11 DIAGNOSIS — I420 Dilated cardiomyopathy: Secondary | ICD-10-CM | POA: Insufficient documentation

## 2018-01-11 DIAGNOSIS — I051 Rheumatic mitral insufficiency: Secondary | ICD-10-CM | POA: Diagnosis not present

## 2018-01-11 NOTE — Progress Notes (Signed)
*  PRELIMINARY RESULTS* Echocardiogram 2D Echocardiogram has been performed.  Sherrie Sport 01/11/2018, 12:24 PM

## 2018-02-06 ENCOUNTER — Encounter: Payer: Self-pay | Admitting: Podiatry

## 2018-02-06 ENCOUNTER — Ambulatory Visit (INDEPENDENT_AMBULATORY_CARE_PROVIDER_SITE_OTHER): Payer: Medicare Other | Admitting: Podiatry

## 2018-02-06 DIAGNOSIS — M79609 Pain in unspecified limb: Secondary | ICD-10-CM

## 2018-02-06 DIAGNOSIS — L851 Acquired keratosis [keratoderma] palmaris et plantaris: Secondary | ICD-10-CM | POA: Diagnosis not present

## 2018-02-06 DIAGNOSIS — B351 Tinea unguium: Secondary | ICD-10-CM

## 2018-02-06 NOTE — Progress Notes (Signed)
Complaint:  Visit Type: Patient returns to my office for continued preventative foot care services. Complaint: Patient states" my nails have grown long and thick and become painful to walk and wear shoes" Patient has history of previous foot surgery. The patient presents for preventative foot care services. Patient taking xarelto. Patient has painful callus left foot.  Podiatric Exam: Vascular: dorsalis pedis and posterior tibial pulses are palpable bilateral. Capillary return is immediate. Temperature gradient is WNL. Skin turgor WNL  Sensorium: Diminished  Semmes Weinstein monofilament test. Normal tactile sensation bilaterally. Nail Exam: Pt has thick disfigured discolored nails with subungual debris noted bilateral entire nail hallux through fifth toenails Ulcer Exam: There is no evidence of ulcer or pre-ulcerative changes or infection. Orthopedic Exam: Muscle tone and strength are WNL. No limitations in general ROM. No crepitus or effusions noted. Foot type and digits show no abnormalities. Midfoot DJD  B/L.Marland Kitchen Pain sub 4th metatarsal left foot. Skin:  Porokeratosis sub 4 left foot. No infection or ulcers  Diagnosis:  Onychomycosis, , Pain in right toe, pain in left toes,  Debride porokeratosis   Treatment & Plan Procedures and Treatment: Consent by patient was obtained for treatment procedures. The patient understood the discussion of treatment and procedures well. All questions were answered thoroughly reviewed. Debridement of mycotic and hypertrophic toenails, 1 through 5 bilateral and clearing of subungual debris. No ulceration, no infection noted. Debride porokeratosis.  ABN signed for 2019. Return Visit-Office Procedure: Patient instructed to return to the office for a follow up visit 3 months for continued evaluation and treatment.    Gardiner Barefoot DPM

## 2018-02-09 DIAGNOSIS — E7849 Other hyperlipidemia: Secondary | ICD-10-CM | POA: Diagnosis not present

## 2018-02-09 DIAGNOSIS — I10 Essential (primary) hypertension: Secondary | ICD-10-CM | POA: Diagnosis not present

## 2018-02-09 DIAGNOSIS — I480 Paroxysmal atrial fibrillation: Secondary | ICD-10-CM | POA: Diagnosis not present

## 2018-02-09 DIAGNOSIS — I440 Atrioventricular block, first degree: Secondary | ICD-10-CM | POA: Diagnosis not present

## 2018-02-09 DIAGNOSIS — Z5181 Encounter for therapeutic drug level monitoring: Secondary | ICD-10-CM | POA: Diagnosis not present

## 2018-02-09 DIAGNOSIS — Z79899 Other long term (current) drug therapy: Secondary | ICD-10-CM | POA: Diagnosis not present

## 2018-02-27 DIAGNOSIS — H401132 Primary open-angle glaucoma, bilateral, moderate stage: Secondary | ICD-10-CM | POA: Diagnosis not present

## 2018-03-02 DIAGNOSIS — M169 Osteoarthritis of hip, unspecified: Secondary | ICD-10-CM | POA: Insufficient documentation

## 2018-03-02 DIAGNOSIS — Z96659 Presence of unspecified artificial knee joint: Secondary | ICD-10-CM | POA: Diagnosis not present

## 2018-03-02 DIAGNOSIS — M25562 Pain in left knee: Secondary | ICD-10-CM | POA: Diagnosis not present

## 2018-03-02 DIAGNOSIS — M1612 Unilateral primary osteoarthritis, left hip: Secondary | ICD-10-CM | POA: Diagnosis not present

## 2018-03-06 ENCOUNTER — Encounter: Payer: Self-pay | Admitting: Family

## 2018-03-06 ENCOUNTER — Ambulatory Visit (INDEPENDENT_AMBULATORY_CARE_PROVIDER_SITE_OTHER): Payer: Medicare Other | Admitting: Family

## 2018-03-06 VITALS — BP 130/78 | HR 69 | Temp 97.9°F | Resp 16 | Wt 196.0 lb

## 2018-03-06 DIAGNOSIS — R490 Dysphonia: Secondary | ICD-10-CM

## 2018-03-06 DIAGNOSIS — I480 Paroxysmal atrial fibrillation: Secondary | ICD-10-CM

## 2018-03-06 DIAGNOSIS — I10 Essential (primary) hypertension: Secondary | ICD-10-CM

## 2018-03-06 MED ORDER — RIVAROXABAN 20 MG PO TABS: 20.0000 mg | ORAL_TABLET | Freq: Every day | ORAL | 0 refills | Status: AC

## 2018-03-06 NOTE — Assessment & Plan Note (Signed)
Controlled.  Continue current regimen.  Patient continues to follow with cardiology

## 2018-03-06 NOTE — Progress Notes (Signed)
Subjective:    Patient ID: Tina Howard, female    DOB: 03/13/1933, 84 y.o.   MRN: [id number]  CC: Tina Howard is a 84 y.o. female who presents today for follow up.   HPI: Afib- concerned at [place] has not come in the mail, has been on phone with [facility], she has been 4 days without. Has been taking 325mg  asa every day. No bleeding, chest pain, sob.   HLD- on Pravachol    hoarseness- suspects related to age and when talks too much. Practicing voice rest per pcp.    Since her last visit, she notes that she has been seen by [facility] for left leg pain. On flexeril TID and tramadol BID with some relief. Pain improves with rest.  Per patient, had negative xrays ( unable to see). Describes hurting left leg while ascending stairs. She has an appt in April with Tina Howard as well.    Echo 12/2017 EF 45-50%. LVH.   [facility] 12/2017- reviewed note  Cardiology 01/2018- dyspnea Continue antihypertensive regimen afib- rivaroxaban , flecainde, diltiazem     Mammogram UTD HISTORY:  Past Medical History:  Diagnosis Date  . Allergic rhinitis   . History of pelvic mass    s/p resection, benign  . Hypercholesteremia   . Hypertension   . Mitral regurgitation    noted echo 04/2016   . Osteoporosis    per pt report on Medical Screening Form   . Pyelonephritis    remote hx   . Shortness of breath dyspnea   . Varicella zoster 2007   Past Surgical History:  Procedure Laterality Date  . BACK SURGERY  1984  . BREAST CYST EXCISION Left    1990s  . CATARACT EXTRACTION Bilateral 2014   Tina. Sandra Howard  . ELECTROPHYSIOLOGIC STUDY N/A 06/15/2016   Procedure: CARDIOVERSION;  Surgeon: Tina Skains, MD;  Location: [facility] ORS;  Service: Cardiovascular;  Laterality: N/A;  . EYE SURGERY     cataract  . FOOT SURGERY    . HERNIA REPAIR  2440   Umbillical  . KNEE ARTHROSCOPY     right  . TOTAL KNEE ARTHROPLASTY     left  . VAGINAL HYSTERECTOMY     benign reasons   Family  History  Problem Relation Age of Onset  . Emphysema Mother   . Heart disease Father   . Breast cancer Maternal Aunt 80    Allergies: Amlodipine and Erythromycin Current Outpatient Medications on File Prior to Visit  Medication Sig Dispense Refill  . Cholecalciferol (VITAMIN D3) 2000 units capsule Take by mouth.    . cyclobenzaprine (FLEXERIL) 5 MG tablet Take 5 mg by mouth 3 (three) times daily.  0  . diclofenac sodium (VOLTAREN) 1 % GEL Apply topically.    Marland Kitchen diltiazem (TIAZAC) 120 MG 24 hr capsule TAKE 1 CAPSULE BY MOUTH  ONCE DAILY    . docusate calcium (SURFAK) 240 MG capsule Take by mouth.    . flecainide (TAMBOCOR) 50 MG tablet Take 1 tablet (50 mg total) by mouth 2 (two) times daily. 60 tablet 4  . furosemide (LASIX) 40 MG tablet Take by mouth.    . hydrALAZINE (APRESOLINE) 25 MG tablet Take 1 tablet (25 mg total) by mouth 3 (three) times daily. 180 tablet 1  . losartan (COZAAR) 100 MG tablet Take by mouth.    . potassium chloride (K-DUR,KLOR-CON) 10 MEQ tablet Take by mouth.    . pravastatin (PRAVACHOL) 20 MG tablet Take  1 tablet (20 mg total) by mouth daily. 90 tablet 3  . timolol (TIMOPTIC) 0.5 % ophthalmic solution Apply to eye.    . traMADol (ULTRAM) 50 MG tablet Take 50 mg by mouth every 6 (six) hours.  0   No current facility-administered medications on file prior to visit.     Social History   Tobacco Use  . Smoking status: Never Smoker  . Smokeless tobacco: Never Used  Substance Use Topics  . Alcohol use: Yes    Alcohol/week: 1.2 oz    Types: 2 Glasses of wine per week    Comment: daily  . Drug use: No    Review of Systems  Constitutional: Negative for chills and fever.  Respiratory: Negative for cough and shortness of breath.   Cardiovascular: Negative for chest pain, palpitations and leg swelling.  Gastrointestinal: Negative for nausea and vomiting.      Objective:    BP 130/78 (BP Location: Left Arm, Patient Position: Sitting, Cuff Size: Normal)    Pulse 69   Temp 97.9 F (36.6 C) (Oral)   Resp 16   Wt 196 lb (88.9 kg)   SpO2 97%   BMI 37.03 kg/m  BP Readings from Last 3 Encounters:  03/06/18 130/78  12/29/17 128/78  12/01/17 (!) 146/84   Wt Readings from Last 3 Encounters:  03/06/18 196 lb (88.9 kg)  12/29/17 196 lb (88.9 kg)  12/01/17 194 lb 2 oz (88.1 kg)    Physical Exam  Constitutional: She appears well-developed and well-nourished.  Eyes: Conjunctivae are normal.  Cardiovascular: Normal rate, regular rhythm, normal heart sounds and normal pulses.  Pulmonary/Chest: Effort normal and breath sounds normal. She has no wheezes. She has no rhonchi. She has no rales.  Neurological: She is alert.  Skin: Skin is warm and dry.  Psychiatric: She has a normal mood and affect. Her speech is normal and behavior is normal. Thought content normal.  Vitals reviewed.      Assessment & Plan:   Problem List Items Addressed This Visit      Cardiovascular and Mediastinum   Paroxysmal A-fib ([name]) - Primary    She has been without Xarelto for 4 days.  She has been taking 325 g of aspirin.  I written her prescription this evening and advised to go directly to [facility] and take her Xarelto dose today.  She also will follow-up with her cardiologist, so he is aware of missed doses.       Relevant Medications   rivaroxaban (XARELTO) 20 MG TABS tablet   Essential hypertension    Controlled.  Continue current regimen.  Patient continues to follow with cardiology      Relevant Medications   rivaroxaban (XARELTO) 20 MG TABS tablet     Other   Hoarseness    Unchanged.  Patient declines referral to ear nose and throat at this time.  She will continue to practice voice rest and let us know if at any point we can place referral for her.          I have discontinued Tina Rickerson. Howard's amoxicillin. I have also changed her rivaroxaban. Additionally, I am having her maintain her flecainide, diclofenac sodium, losartan, Vitamin D3, diltiazem,  docusate calcium, furosemide, potassium chloride, timolol, hydrALAZINE, pravastatin, cyclobenzaprine, and traMADol.   Meds ordered this encounter  Medications  . rivaroxaban (XARELTO) 20 MG TABS tablet    Sig: Take 1 tablet (20 mg total) by mouth daily with supper.    Dispense:  10  tablet    Refill:  0    Order Specific Question:   Supervising Provider    Answer:   Crecencio Mc [[id number]]    Return precautions given.   Risks, benefits, and alternatives of the medications and treatment plan prescribed today were discussed, and patient expressed understanding.   Education regarding symptom management and diagnosis given to patient on AVS.  Continue to follow with McLean-Scocuzza, Nino Glow, MD for routine health maintenance.   Illene Labrador and I agreed with plan.   Mable Paris, FNP

## 2018-03-06 NOTE — Assessment & Plan Note (Signed)
She has been without Xarelto for 4 days.  She has been taking 325 g of aspirin.  I written her prescription this evening and advised to [facility] and take her Xarelto dose today.  She also will follow-up with her cardiologist, so he is aware of missed doses.

## 2018-03-06 NOTE — Assessment & Plan Note (Signed)
Unchanged.  Patient declines referral to ear nose and throat at this time.  She will continue to practice voice rest and let us know if at any point we can place referral for her.

## 2018-03-06 NOTE — Patient Instructions (Addendum)
Please start xarelto tonight.  Please go to pharmacy directly from our office.    Happy you are with Dr Aundra Dubin and wish you my absolutely the very best.

## 2018-03-20 ENCOUNTER — Telehealth: Payer: Self-pay | Admitting: Family

## 2018-03-20 NOTE — Telephone Encounter (Signed)
Call pt   Wanted to circle back after she had been off the xarelto - she had a problem with mail order  Please ensure she has medication now

## 2018-03-22 ENCOUNTER — Telehealth: Payer: Self-pay | Admitting: Family

## 2018-03-22 NOTE — Telephone Encounter (Signed)
Left voice mail to call back ok for [facility] to speak to patient 

## 2018-03-22 NOTE — Telephone Encounter (Unsigned)
Copied from [place] [phone]. Topic: Quick Communication - Office Called Patient >> Mar 22, 2018  4:46 PM Bare, Patriciaann Clan, CMA wrote: Reason for CRM: see telephone encounter

## 2018-03-23 NOTE — Telephone Encounter (Signed)
Left voice mail to call back ok for [facility] to speak to patient 

## 2018-03-30 DIAGNOSIS — M79605 Pain in left leg: Secondary | ICD-10-CM | POA: Diagnosis not present

## 2018-03-30 DIAGNOSIS — M545 Low back pain: Secondary | ICD-10-CM | POA: Diagnosis not present

## 2018-03-30 DIAGNOSIS — G8929 Other chronic pain: Secondary | ICD-10-CM | POA: Diagnosis not present

## 2018-03-30 DIAGNOSIS — Z96653 Presence of artificial knee joint, bilateral: Secondary | ICD-10-CM | POA: Diagnosis not present

## 2018-03-30 DIAGNOSIS — M25552 Pain in left hip: Secondary | ICD-10-CM | POA: Diagnosis not present

## 2018-04-18 IMAGING — US US CAROTID DUPLEX BILAT
1 series · 13 of 24 positions shown · non-contrast
Comparison: MRI 11/08/2013 .

CLINICAL DATA: Blurred vision.

EXAM:
BILATERAL CAROTID DUPLEX ULTRASOUND
TECHNIQUE: Gray scale imaging, color Doppler and duplex ultrasound were
performed of bilateral carotid and vertebral arteries in the neck.

[Series 1: us carotid duplex bilat · 13 of 56 slices shown]
[im 1/56]
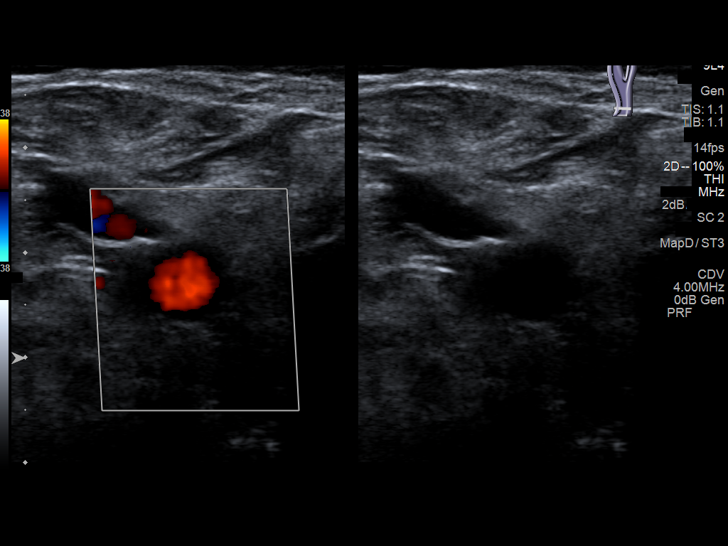
[im 5/56]
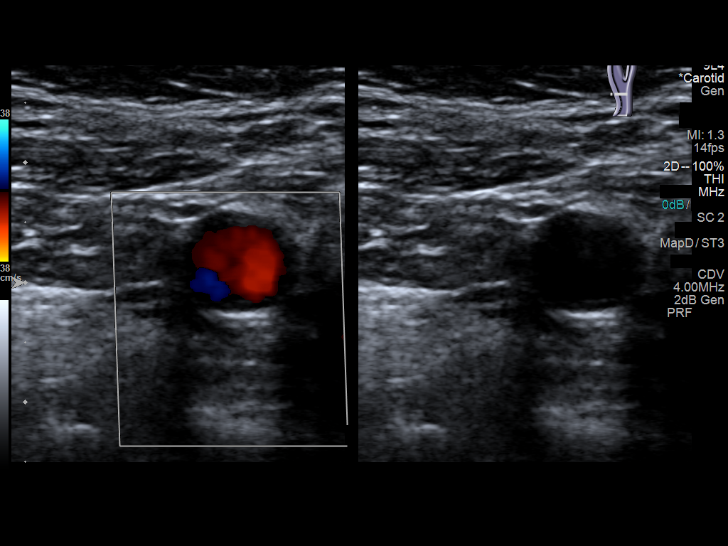
[im 10/56]
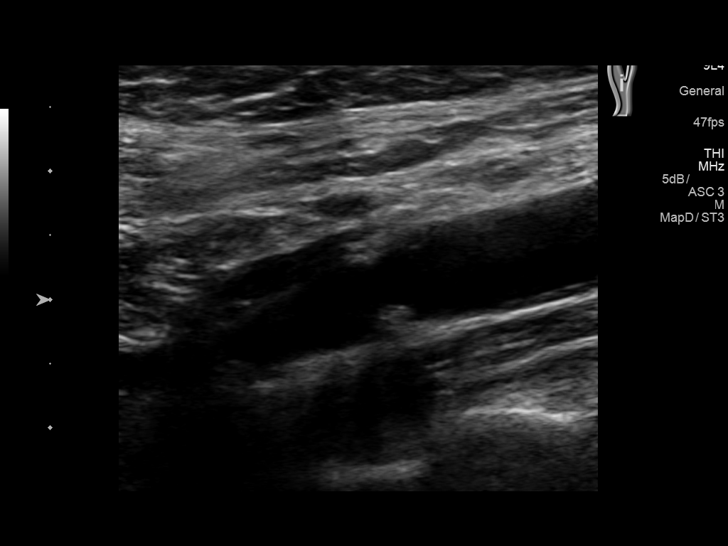
[im 15/56]
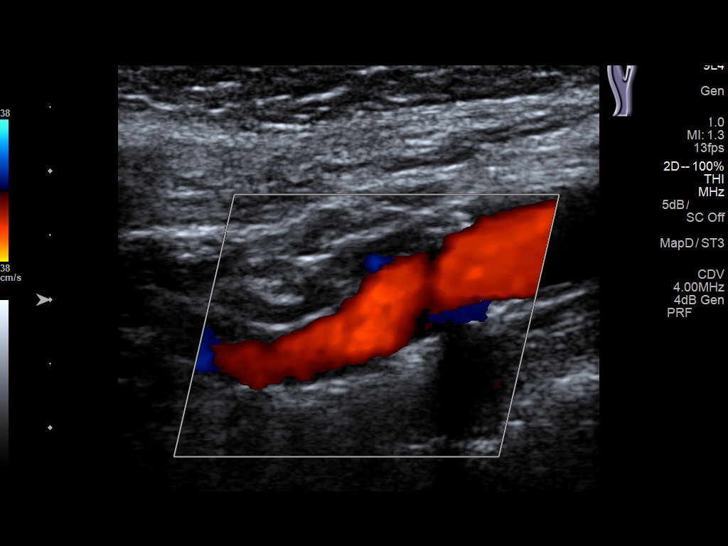
[im 20/56]
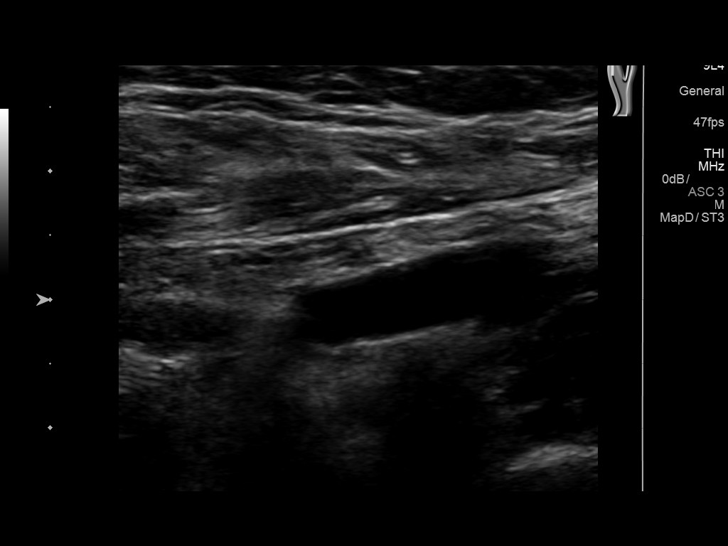
[im 24/56]
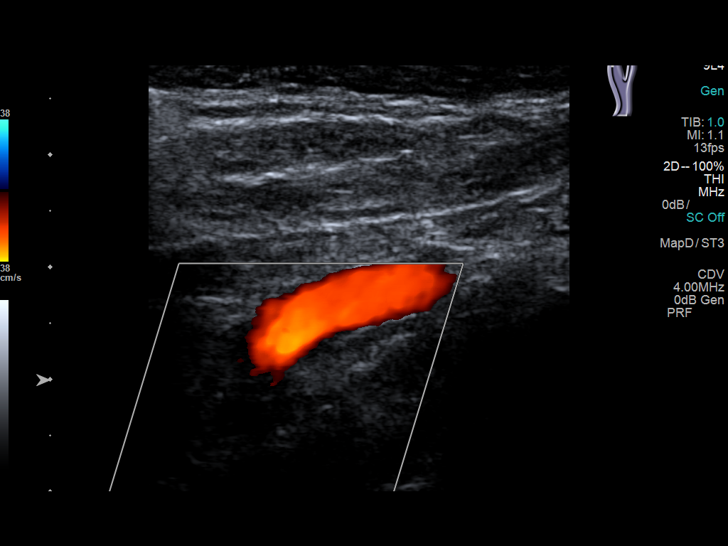
[im 29/56]
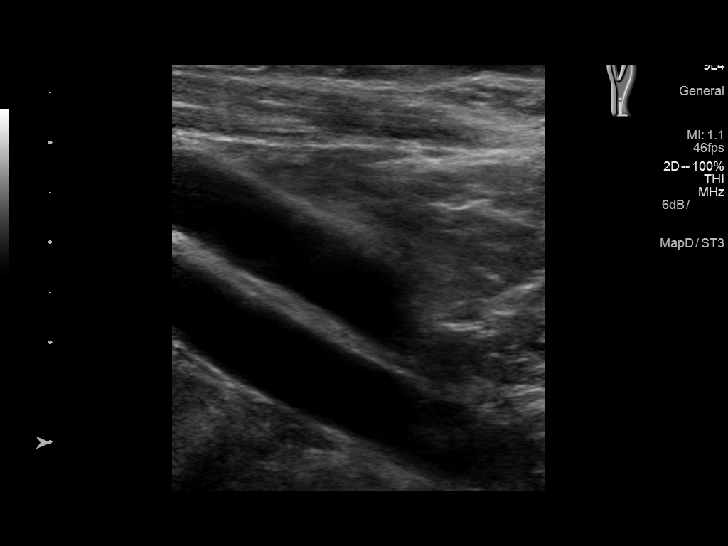
[im 32/56]
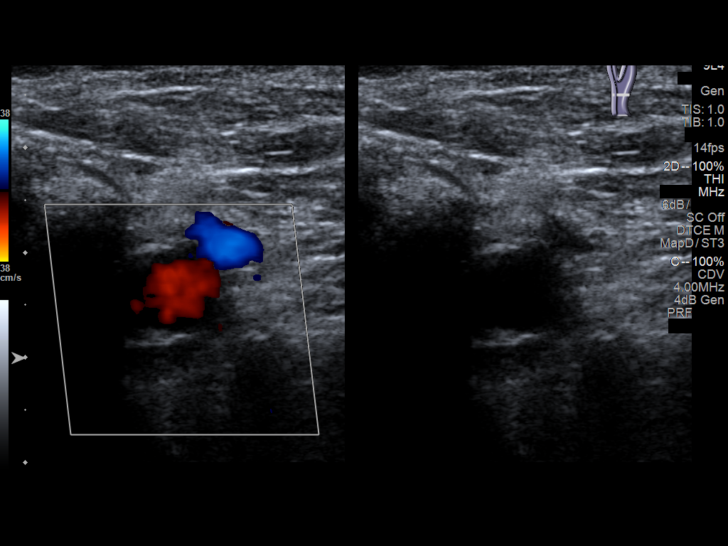
[im 36/56]
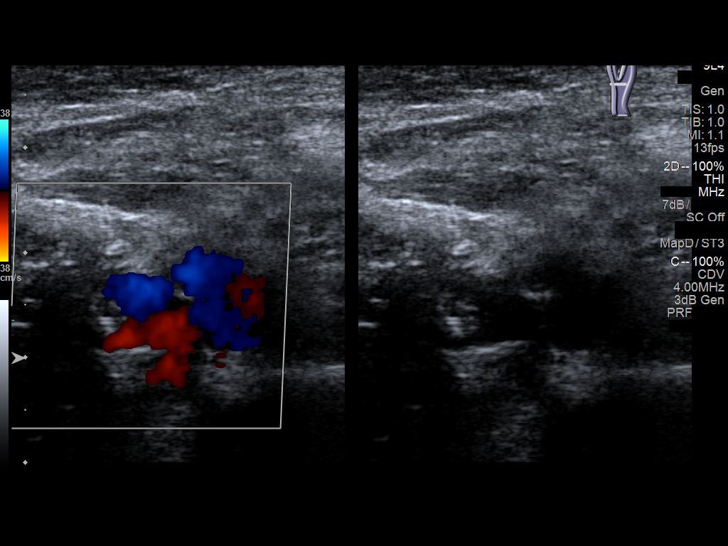
[im 41/56]
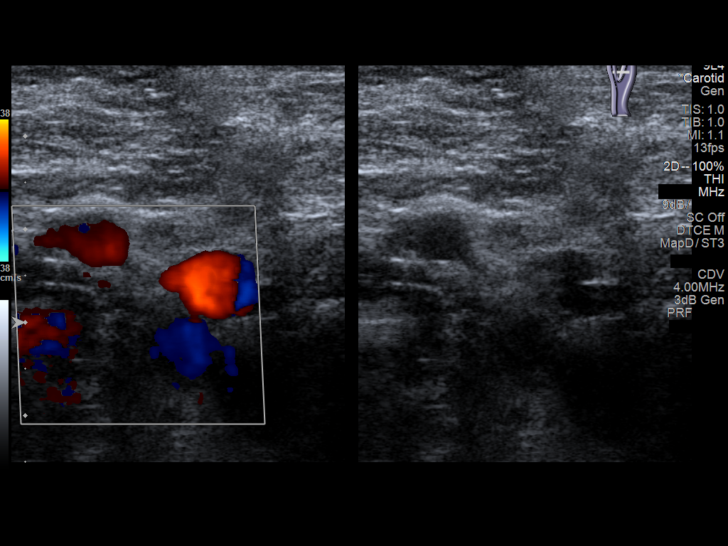
[im 46/56]
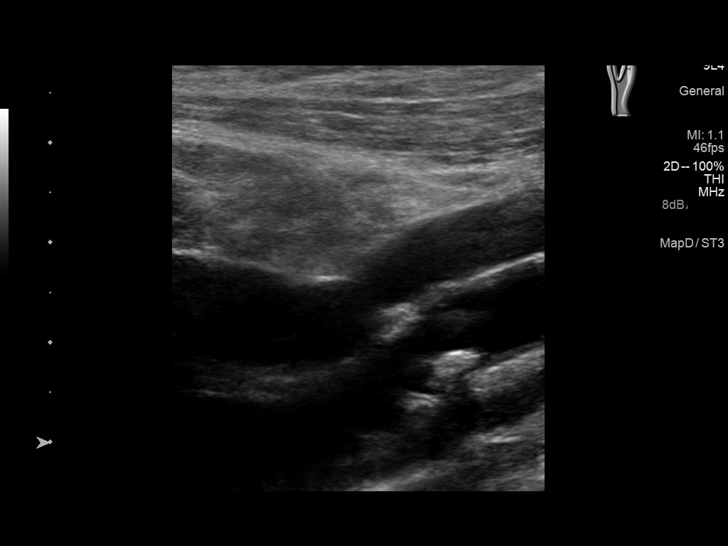
[im 51/56]
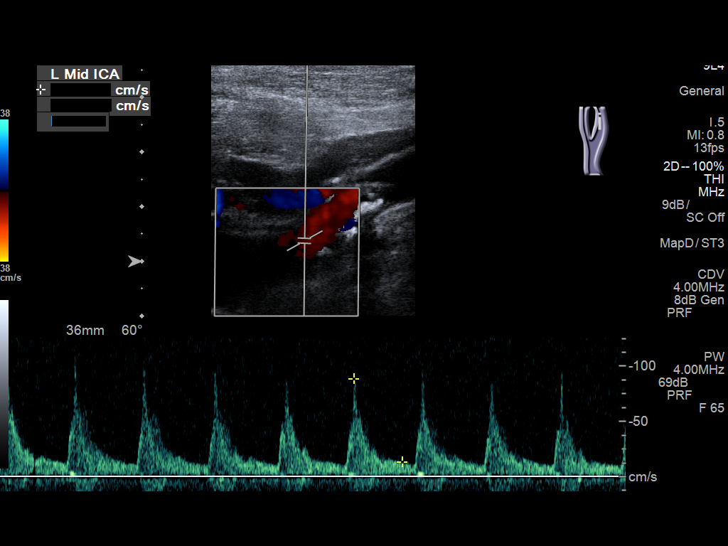
[im 56/56]
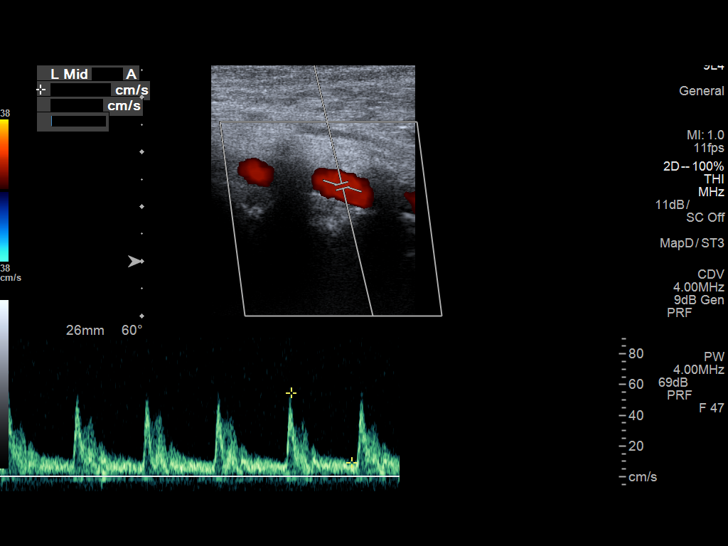

[13 of 24 positions shown; findings below may reference images not displayed]

FINDINGS: Criteria: Quantification of carotid stenosis is based on velocity
parameters that correlate the residual internal carotid diameter
with NASCET-based stenosis levels, using the diameter of the distal
internal carotid lumen as the denominator for stenosis measurement.

The following velocity measurements were obtained:

RIGHT

ICA:  119/22 cm/sec

CCA:  94/13 cm/sec

SYSTOLIC ICA/CCA RATIO:

DIASTOLIC ICA/CCA RATIO:

ECA:  103 cm/sec

LEFT

ICA:  89/14 cm/sec

CCA:  88/9 cm/sec

SYSTOLIC ICA/CCA RATIO:

DIASTOLIC ICA/CCA RATIO:

ECA:  107 cm/sec

RIGHT CAROTID ARTERY: Mild right carotid bifurcation atherosclerotic
vascular plaque. No flow limiting stenosis.

RIGHT VERTEBRAL ARTERY:  Patent with antegrade flow.

LEFT CAROTID ARTERY: Mild left carotid bifurcation and proximal ICA
atherosclerotic vascular plaque. No flow limiting stenosis.

LEFT VERTEBRAL ARTERY:  Patent with antegrade flow.
IMPRESSION: 1. Mild right carotid bifurcation atherosclerotic vascular plaque.
No flow limiting stenosis. Degree of stenosis less than 50%.

2. Mild left carotid bifurcation proximal ICA atherosclerotic
vascular plaque. No flow limiting stenosis. Degree of stenosis less
than 50%.

3. Vertebral arteries are patent antegrade flow.

## 2018-04-18 NOTE — Telephone Encounter (Signed)
Patient never returned call  

## 2018-05-09 ENCOUNTER — Ambulatory Visit: Payer: Medicare Other | Admitting: Podiatry

## 2018-05-19 ENCOUNTER — Ambulatory Visit (INDEPENDENT_AMBULATORY_CARE_PROVIDER_SITE_OTHER): Payer: Medicare Other | Admitting: Podiatry

## 2018-05-19 DIAGNOSIS — M79676 Pain in unspecified toe(s): Secondary | ICD-10-CM

## 2018-05-19 DIAGNOSIS — B351 Tinea unguium: Secondary | ICD-10-CM | POA: Diagnosis not present

## 2018-05-19 DIAGNOSIS — M19011 Primary osteoarthritis, right shoulder: Secondary | ICD-10-CM | POA: Diagnosis not present

## 2018-05-19 DIAGNOSIS — M25511 Pain in right shoulder: Secondary | ICD-10-CM | POA: Diagnosis not present

## 2018-05-19 DIAGNOSIS — L989 Disorder of the skin and subcutaneous tissue, unspecified: Secondary | ICD-10-CM | POA: Diagnosis not present

## 2018-05-19 DIAGNOSIS — M79609 Pain in unspecified limb: Principal | ICD-10-CM

## 2018-05-22 NOTE — Progress Notes (Signed)
    Subjective: Patient is a 84 y.o. female presenting to the office today with a chief complaint of a painful callus lesion to the left forefoot that has been present for several weeks. Bearing weight increases the pain. She has not done anything for treatment.   Patient also complains of elongated, thickened nails that cause pain while ambulating in shoes. She is unable to trim her own nails. Patient presents today for further treatment and evaluation.  Past Medical History:  Diagnosis Date  . Allergic rhinitis   . History of pelvic mass    s/p resection, benign  . Hypercholesteremia   . Hypertension   . Mitral regurgitation    noted echo 04/2016   . Osteoporosis    per pt report on Medical Screening Form   . Pyelonephritis    remote hx   . Shortness of breath dyspnea   . Varicella zoster 2007    Objective:  Physical Exam General: Alert and oriented x3 in no acute distress  Dermatology: Hyperkeratotic lesion present on the left forefoot. Pain on palpation with a central nucleated core noted. Skin is warm, dry and supple bilateral lower extremities. Negative for open lesions or macerations. Nails are tender, long, thickened and dystrophic with subungual debris, consistent with onychomycosis, 1-5 bilateral. No signs of infection noted.  Vascular: Palpable pedal pulses bilaterally. No edema or erythema noted. Capillary refill within normal limits.  Neurological: Epicritic and protective threshold grossly intact bilaterally.   Musculoskeletal Exam: Pain on palpation at the keratotic lesion noted. Range of motion within normal limits bilateral. Muscle strength 5/5 in all groups bilateral.  Assessment: 1. Onychodystrophic nails 1-5 bilateral with hyperkeratosis of nails.  2. Onychomycosis of nail due to dermatophyte bilateral 3. Pre-ulcerative callus lesion noted to the left forefoot   Plan of Care:  1. Patient evaluated. 2. Excisional debridement of keratoic lesion using a  chisel blade was performed without incident.  3. Dressed with light dressing. 4. Mechanical debridement of nails 1-5 bilaterally performed using a nail nipper. Filed with dremel without incident.  5. Patient is to return to the clinic in 3 months.   Edrick Kins, DPM [facility]  Dr. Edrick Kins, [address]                                        [place], [place] [address]                Office [phone]  Fax [phone]

## 2018-06-05 DIAGNOSIS — M7581 Other shoulder lesions, right shoulder: Secondary | ICD-10-CM | POA: Diagnosis not present

## 2018-06-05 DIAGNOSIS — M19011 Primary osteoarthritis, right shoulder: Secondary | ICD-10-CM | POA: Diagnosis not present

## 2018-06-05 DIAGNOSIS — M12811 Other specific arthropathies, not elsewhere classified, right shoulder: Secondary | ICD-10-CM | POA: Diagnosis not present

## 2018-06-08 ENCOUNTER — Encounter: Payer: Self-pay | Admitting: Internal Medicine

## 2018-06-12 ENCOUNTER — Other Ambulatory Visit: Payer: Self-pay | Admitting: Surgery

## 2018-06-12 DIAGNOSIS — M19011 Primary osteoarthritis, right shoulder: Secondary | ICD-10-CM

## 2018-06-15 ENCOUNTER — Other Ambulatory Visit: Payer: Self-pay | Admitting: Surgery

## 2018-06-15 DIAGNOSIS — M12811 Other specific arthropathies, not elsewhere classified, right shoulder: Secondary | ICD-10-CM

## 2018-06-15 DIAGNOSIS — I440 Atrioventricular block, first degree: Secondary | ICD-10-CM | POA: Diagnosis not present

## 2018-06-15 DIAGNOSIS — I10 Essential (primary) hypertension: Secondary | ICD-10-CM | POA: Diagnosis not present

## 2018-06-15 DIAGNOSIS — R7303 Prediabetes: Secondary | ICD-10-CM | POA: Diagnosis not present

## 2018-06-15 DIAGNOSIS — Z01818 Encounter for other preprocedural examination: Secondary | ICD-10-CM | POA: Diagnosis not present

## 2018-06-15 DIAGNOSIS — R0602 Shortness of breath: Secondary | ICD-10-CM | POA: Diagnosis not present

## 2018-06-15 DIAGNOSIS — I480 Paroxysmal atrial fibrillation: Secondary | ICD-10-CM | POA: Diagnosis not present

## 2018-06-15 DIAGNOSIS — M7581 Other shoulder lesions, right shoulder: Secondary | ICD-10-CM

## 2018-06-15 DIAGNOSIS — E7849 Other hyperlipidemia: Secondary | ICD-10-CM | POA: Diagnosis not present

## 2018-06-15 DIAGNOSIS — M19011 Primary osteoarthritis, right shoulder: Secondary | ICD-10-CM

## 2018-06-15 DIAGNOSIS — E876 Hypokalemia: Secondary | ICD-10-CM | POA: Diagnosis not present

## 2018-06-19 ENCOUNTER — Ambulatory Visit
Admission: RE | Admit: 2018-06-19 | Discharge: 2018-06-19 | Disposition: A | Discharge status: Home / Self Care | Payer: Medicare Other | Source: Ambulatory Visit | Attending: Surgery | Admitting: Surgery

## 2018-06-19 ENCOUNTER — Ambulatory Visit (INDEPENDENT_AMBULATORY_CARE_PROVIDER_SITE_OTHER): Payer: Medicare Other | Admitting: Internal Medicine

## 2018-06-19 ENCOUNTER — Encounter: Payer: Self-pay | Admitting: Internal Medicine

## 2018-06-19 VITALS — BP 140/80 | HR 61 | Temp 98.4°F | Ht 61.0 in | Wt 195.2 lb

## 2018-06-19 DIAGNOSIS — M19011 Primary osteoarthritis, right shoulder: Secondary | ICD-10-CM | POA: Diagnosis not present

## 2018-06-19 DIAGNOSIS — M12811 Other specific arthropathies, not elsewhere classified, right shoulder: Secondary | ICD-10-CM

## 2018-06-19 DIAGNOSIS — M65811 Other synovitis and tenosynovitis, right shoulder: Secondary | ICD-10-CM | POA: Diagnosis not present

## 2018-06-19 DIAGNOSIS — X58XXXA Exposure to other specified factors, initial encounter: Secondary | ICD-10-CM | POA: Diagnosis not present

## 2018-06-19 DIAGNOSIS — M7581 Other shoulder lesions, right shoulder: Secondary | ICD-10-CM | POA: Diagnosis not present

## 2018-06-19 DIAGNOSIS — M25411 Effusion, right shoulder: Secondary | ICD-10-CM | POA: Insufficient documentation

## 2018-06-19 DIAGNOSIS — N3000 Acute cystitis without hematuria: Secondary | ICD-10-CM

## 2018-06-19 DIAGNOSIS — S46811A Strain of other muscles, fascia and tendons at shoulder and upper arm level, right arm, initial encounter: Secondary | ICD-10-CM | POA: Diagnosis not present

## 2018-06-19 DIAGNOSIS — M25511 Pain in right shoulder: Secondary | ICD-10-CM | POA: Diagnosis not present

## 2018-06-19 DIAGNOSIS — R109 Unspecified abdominal pain: Secondary | ICD-10-CM | POA: Diagnosis not present

## 2018-06-19 MED ORDER — NITROFURANTOIN MONOHYD MACRO 100 MG PO CAPS: 100.0000 mg | ORAL_CAPSULE | Freq: Two times a day (BID) | ORAL | 0 refills | Status: DC

## 2018-06-19 NOTE — Progress Notes (Signed)
Chief Complaint  Patient presents with  . Urinary Tract Infection   Acute visit  1. Feels like having UTI does not feel like emptying bladder having lower pelvic pressure/ab pressure radiating to her back x 1 month and dark yellow urine. Pt reports hip to hip and lower pelvic front to back pain worse Sunday she reports h/o vaginal hysterectomy in 30s with bladder prolapse and rectocele and in 2002 had both ovaries removed  2. Upcoming ortho surgery coming up reverse shoulder with Dr. Roland Rack  3. Afib f/u with Dr. Erin Fulling  Review of Systems  Constitutional: Negative for weight loss.  HENT: Negative for hearing loss.   Eyes: Negative for blurred vision.  Respiratory: Negative for shortness of breath.   Cardiovascular: Negative for chest pain.  Gastrointestinal: Positive for abdominal pain.  Genitourinary:       Not emptying bladder  Urine color changed   Musculoskeletal: Positive for joint pain. Negative for falls.  Skin: Negative for rash.  Psychiatric/Behavioral: Negative for depression.   Past Medical History:  Diagnosis Date  . Allergic rhinitis   . History of pelvic mass    s/p resection, benign  . Hypercholesteremia   . Hypertension   . Mitral regurgitation    noted echo 04/2016   . Osteoporosis    per pt report on Medical Screening Form   . Pyelonephritis    remote hx   . Shortness of breath dyspnea   . Varicella zoster 2007   Past Surgical History:  Procedure Laterality Date  . BACK SURGERY  1984  . BREAST CYST EXCISION Left    1990s  . CATARACT EXTRACTION Bilateral 2014   Dr. Sandra Cockayne  . ELECTROPHYSIOLOGIC STUDY N/A 06/15/2016   Procedure: CARDIOVERSION;  Surgeon: [name], MD;  Location: [facility] ORS;  Service: Cardiovascular;  Laterality: N/A;  . EYE SURGERY     cataract  . FOOT SURGERY    . HERNIA REPAIR  8185   Umbillical  . KNEE ARTHROSCOPY     right  . TOTAL KNEE ARTHROPLASTY     left  . VAGINAL HYSTERECTOMY     benign reasons   Family  History  Problem Relation Age of Onset  . Emphysema Mother   . Heart disease Father   . Breast cancer Maternal Aunt 80   Social History   Socioeconomic History  . Marital status: Single    Spouse name: Not on file  . Number of children: Not on file  . Years of education: Not on file  . Highest education level: Not on file  Occupational History  . Not on file  Social Needs  . Financial resource strain: Not on file  . Food insecurity:    Worry: Not on file    Inability: Not on file  . Transportation needs:    Medical: Not on file    Non-medical: Not on file  Tobacco Use  . Smoking status: Never Smoker  . Smokeless tobacco: Never Used  Substance and Sexual Activity  . Alcohol use: Yes    Alcohol/week: 1.2 oz    Types: 2 Glasses of wine per week    Comment: daily  . Drug use: No  . Sexual activity: Never  Lifestyle  . Physical activity:    Days per week: Not on file    Minutes per session: Not on file  . Stress: Not on file  Relationships  . Social connections:    Talks on phone: Not on file    Gets  together: Not on file    Attends religious service: Not on file    Active member of club or organization: Not on file    Attends meetings of clubs or organizations: Not on file    Relationship status: Not on file  . Intimate partner violence:    Fear of current or ex partner: Not on file    Emotionally abused: Not on file    Physically abused: Not on file    Forced sexual activity: Not on file  Other Topics Concern  . Not on file  Social History Narrative   Widowed.   Very active within her community.   Enjoys singing, being active in her church, spending time with family.   Current Meds  Medication Sig  . acetaminophen (TYLENOL) 500 MG tablet Take 500-1,000 mg by mouth 2 (two) times daily as needed for moderate pain or headache.  Marland Kitchen amoxicillin (AMOXIL) 500 MG capsule Take 2,000 mg by mouth See admin instructions. Take 2000 mg by mouth 1 hour prior to dental work   . calcium carbonate (TUMS - DOSED IN MG ELEMENTAL CALCIUM) 500 MG chewable tablet Chew 1 tablet by mouth daily as needed for indigestion or heartburn.  . Cholecalciferol (VITAMIN D3) 2000 units capsule Take 2,000 Units by mouth daily.   . cyclobenzaprine (FLEXERIL) 5 MG tablet Take 5 mg by mouth 2 (two) times daily.  . diclofenac sodium (VOLTAREN) 1 % GEL Apply 1 application topically as needed (foot pain).   Marland Kitchen diltiazem (TIAZAC) 120 MG 24 hr capsule Take 120 mg by mouth once daily at night  . docusate calcium (SURFAK) 240 MG capsule Take 240 mg by mouth at bedtime.   . flecainide (TAMBOCOR) 50 MG tablet Take 1 tablet (50 mg total) by mouth 2 (two) times daily.  . furosemide (LASIX) 40 MG tablet Take 40 mg by mouth daily.   . hydrALAZINE (APRESOLINE) 25 MG tablet Take 1 tablet (25 mg total) by mouth 3 (three) times daily. (Patient taking differently: Take 25 mg by mouth 2 (two) times daily. )  . loratadine (CLARITIN) 10 MG tablet Take 10 mg by mouth daily as needed for allergies.  Marland Kitchen losartan (COZAAR) 100 MG tablet Take 100 mg by mouth daily.   . potassium chloride (K-DUR,KLOR-CON) 10 MEQ tablet Take 20 mEq by mouth daily.   . pravastatin (PRAVACHOL) 20 MG tablet Take 1 tablet (20 mg total) by mouth daily. (Patient taking differently: Take 20 mg by mouth every evening. )  . rivaroxaban (XARELTO) 20 MG TABS tablet Take 1 tablet (20 mg total) by mouth daily with supper.  . timolol (TIMOPTIC) 0.5 % ophthalmic solution Place 1 drop into both eyes daily.    Allergies  Allergen Reactions  . Amlodipine Swelling  . Erythromycin Nausea And Vomiting   No results found for this or any previous visit (from the past 2160 hour(s)). Objective  Body mass index is 36.88 kg/m. Wt Readings from Last 3 Encounters:  06/19/18 195 lb 3.2 oz (88.5 kg)  03/06/18 196 lb (88.9 kg)  12/29/17 196 lb (88.9 kg)   Temp Readings from Last 3 Encounters:  06/19/18 98.4 F (36.9 C) (Oral)  03/06/18 97.9 F (36.6 C)  (Oral)  12/29/17 97.7 F (36.5 C) (Oral)   BP Readings from Last 3 Encounters:  06/19/18 140/80  03/06/18 130/78  12/29/17 128/78   Pulse Readings from Last 3 Encounters:  06/19/18 61  03/06/18 69  12/29/17 72    Physical Exam  Constitutional: She  is oriented to person, place, and time. Vital signs are normal. She appears well-developed and well-nourished. She is cooperative.  HENT:  Head: Normocephalic and atraumatic.  Mouth/Throat: Oropharynx is clear and moist and mucous membranes are normal.  Eyes: Pupils are equal, round, and reactive to light. Conjunctivae are normal.  Cardiovascular: Normal rate. An irregularly irregular rhythm present.  Murmur heard. In Afib  Pulmonary/Chest: Effort normal and breath sounds normal.  Abdominal: There is tenderness in the right lower quadrant, suprapubic area and left lower quadrant.  Neurological: She is alert and oriented to person, place, and time. Gait normal.  Skin: Skin is warm, dry and intact.  Psychiatric: She has a normal mood and affect. Her speech is normal and behavior is normal. Judgment and thought content normal. Cognition and memory are normal.  Nursing note and vitals reviewed.   Assessment   1.Possibly urinary retention with c/w UTI. Lower pelvic ab pain.pressure  2. HM Plan  1. UA and culture today  Trial of macrobid Consider ab/pelvic CT and urogyn/urology referral in future  2.  UTD vaccines  rec shingrix vaccine  will disc repeat pna 23 vaccine in future has been 5 years   Colonoscopy had 12/24/13 consider cologuard if candidate in future  mammo had 07/19/17 neg  Out of pap window s/p hysterectomy then ovaries removed in 2002 DEXA 05/29/15 osteopenia on vit D nl vit D will rec calcium 600 mg bid in future   Pending right shoulder surgery Dr. Rae Mar rec surgery c/w rotator cuff etiology rec right total shoulder arthroplasty    Provider: Dr. Olivia Mackie McLean-Scocuzza-Internal Medicine

## 2018-06-19 NOTE — Patient Instructions (Addendum)
Try Macrobid 2x per day with food x 5 days  Consider pelvic CT  Consider urogynecology in [place]  Call back when ready please    Urinary Tract Infection, Adult A urinary tract infection (UTI) is an infection of any part of the urinary tract, which includes the kidneys, ureters, bladder, and urethra. These organs make, store, and get rid of urine in the body. UTI can be a bladder infection (cystitis) or kidney infection (pyelonephritis). What are the causes? This infection may be caused by fungi, viruses, or bacteria. Bacteria are the most common cause of UTIs. This condition can also be caused by repeated incomplete emptying of the bladder during urination. What increases the risk? This condition is more likely to develop if:  You ignore your need to urinate or hold urine for long periods of time.  You do not empty your bladder completely during urination.  You wipe back to front after urinating or having a bowel movement, if you are female.  You are uncircumcised, if you are female.  You are constipated.  You have a urinary catheter that stays in place (indwelling).  You have a weak defense (immune) system.  You have a medical condition that affects your bowels, kidneys, or bladder.  You have diabetes.  You take antibiotic medicines frequently or for long periods of time, and the antibiotics no longer work well against certain types of infections (antibiotic resistance).  You take medicines that irritate your urinary tract.  You are exposed to chemicals that irritate your urinary tract.  You are female.  What are the signs or symptoms? Symptoms of this condition include:  Fever.  Frequent urination or passing small amounts of urine frequently.  Needing to urinate urgently.  Pain or burning with urination.  Urine that smells bad or unusual.  Cloudy urine.  Pain in the lower abdomen or back.  Trouble urinating.  Blood in the urine.  Vomiting or being  less hungry than normal.  Diarrhea or abdominal pain.  Vaginal discharge, if you are female.  How is this diagnosed? This condition is diagnosed with a medical history and physical exam. You will also need to provide a urine sample to test your urine. Other tests may be done, including:  Blood tests.  Sexually transmitted disease (STD) testing.  If you have had more than one UTI, a cystoscopy or imaging studies may be done to determine the cause of the infections. How is this treated? Treatment for this condition often includes a combination of two or more of the following:  Antibiotic medicine.  Other medicines to treat less common causes of UTI.  Over-the-counter medicines to treat pain.  Drinking enough water to stay hydrated.  Follow these instructions at home:  Take over-the-counter and prescription medicines only as told by your health care provider.  If you were prescribed an antibiotic, take it as told by your health care provider. Do not stop taking the antibiotic even if you start to feel better.  Avoid alcohol, caffeine, tea, and carbonated beverages. They can irritate your bladder.  Drink enough fluid to keep your urine clear or pale yellow.  Keep all follow-up visits as told by your health care provider. This is important.  Make sure to: ? Empty your bladder often and completely. Do not hold urine for long periods of time. ? Empty your bladder before and after sex. ? Wipe from front to back after a bowel movement if you are female. Use each tissue one time when you  wipe. Contact a health care provider if:  You have back pain.  You have a fever.  You feel nauseous or vomit.  Your symptoms do not get better after 3 days.  Your symptoms go away and then return. Get help right away if:  You have severe back pain or lower abdominal pain.  You are vomiting and cannot keep down any medicines or water. This information is not intended to replace advice  given to you by your health care provider. Make sure you discuss any questions you have with your health care provider. Document Released: 09/22/2005 Document Revised: 05/26/2016 Document Reviewed: 11/03/2015 Elsevier Interactive Patient Education  2018 [employer].    Urinary Frequency, Adult Urinary frequency means urinating more often than usual. People with urinary frequency urinate at least 8 times in 24 hours, even if they drink a normal amount of fluid. Although they urinate more often than normal, the total amount of urine produced in a day may be normal. Urinary frequency is also called pollakiuria. What are the causes? This condition may be caused by:  A urinary tract infection.  Obesity.  Bladder problems, such as bladder stones.  Caffeine or alcohol.  Eating food or drinking fluids that irritate the bladder. These include coffee, tea, soda, artificial sweeteners, citrus, tomato-based foods, and chocolate.  Certain medicines, such as medicines that help the body get rid of extra fluid (diuretics).  Muscle or nerve weakness.  Overactive bladder.  Chronic diabetes.  Interstitial cystitis.  In men, problems with the prostate, such as an enlarged prostate.  In women, pregnancy.  In some cases, the cause may not be known. What increases the risk? This condition is more likely to develop in:  Women who have gone through menopause.  Men with prostate problems.  People with a disease or injury that affects the nerves or spinal cord.  People who have or have had a condition that affects the brain, such as a stroke.  What are the signs or symptoms? Symptoms of this condition include:  Feeling an urgent need to urinate often. The stress and anxiety of needing to find a bathroom quickly can make this urge worse.  Urinating 8 or more times in 24 hours.  Urinating as often as every 1 to 2 hours.  How is this diagnosed? This condition is diagnosed based on your  symptoms, your medical history, and a physical exam. You may have tests, such as:  Blood tests.  Urine tests.  Imaging tests, such as X-rays or ultrasounds.  A bladder test.  A test of your neurological system. This is the body system that senses the need to urinate.  A test to check for problems in the urethra and bladder called cystoscopy.  You may also be asked to keep a bladder diary. A bladder diary is a record of what you eat and drink, how often you urinate, and how much you urinate. You may need to see a health care provider who specializes in conditions of the urinary tract (urologist) or kidneys (nephrologist). How is this treated? Treatment for this condition depends on the cause. Sometimes the condition goes away on its own and treatment is not necessary. If treatment is needed, it may include:  Taking medicine.  Learning exercises that strengthen the muscles that help control urination.  Following a bladder training program. This may include: ? Learning to delay going to the bathroom. ? Double urinating (voiding). This helps if you are not completely emptying your bladder. ? Scheduled  voiding.  Making diet changes, such as: ? Avoiding caffeine. ? Drinking fewer fluids, especially alcohol. ? Not drinking in the evening. ? Not having foods or drinks that may irritate the bladder. ? Eating foods that help prevent or ease constipation. Constipation can make this condition worse.  Having the nerves in your bladder stimulated. There are two options for stimulating the nerves to your bladder: ? Outpatient electrical nerve stimulation. This is done by your health care provider. ? Surgery to implant a bladder pacemaker. The pacemaker helps to control the urge to urinate.  Follow these instructions at home:  Keep a bladder diary if told to by your health care provider.  Take over-the-counter and prescription medicines only as told by your health care provider.  Do any  exercises as told by your health care provider.  Follow a bladder training program as told by your health care provider.  Make any recommended diet changes.  Keep all follow-up visits as told by your health care provider. This is important. Contact a health care provider if:  You start urinating more often.  You feel pain or irritation when you urinate.  You notice blood in your urine.  Your urine looks cloudy.  You develop a fever.  You begin vomiting. Get help right away if:  You are unable to urinate. This information is not intended to replace advice given to you by your health care provider. Make sure you discuss any questions you have with your health care provider. Document Released: 10/09/2009 Document Revised: 01/14/2016 Document Reviewed: 07/09/2015 Elsevier Interactive Patient Education  [employer].

## 2018-06-19 NOTE — Progress Notes (Signed)
Pre visit review using our clinic review tool, if applicable. No additional management support is needed unless otherwise documented below in the visit note. 

## 2018-06-20 ENCOUNTER — Other Ambulatory Visit: Payer: Self-pay

## 2018-06-20 ENCOUNTER — Ambulatory Visit
Admission: RE | Admit: 2018-06-20 | Discharge: 2018-06-20 | Disposition: A | Discharge status: Home / Self Care | Payer: Medicare Other | Source: Ambulatory Visit | Attending: Surgery | Admitting: Surgery

## 2018-06-20 DIAGNOSIS — Z90710 Acquired absence of both cervix and uterus: Secondary | ICD-10-CM | POA: Insufficient documentation

## 2018-06-20 DIAGNOSIS — R339 Retention of urine, unspecified: Secondary | ICD-10-CM | POA: Insufficient documentation

## 2018-06-20 DIAGNOSIS — Z825 Family history of asthma and other chronic lower respiratory diseases: Secondary | ICD-10-CM | POA: Insufficient documentation

## 2018-06-20 DIAGNOSIS — M25811 Other specified joint disorders, right shoulder: Secondary | ICD-10-CM | POA: Insufficient documentation

## 2018-06-20 DIAGNOSIS — I340 Nonrheumatic mitral (valve) insufficiency: Secondary | ICD-10-CM | POA: Insufficient documentation

## 2018-06-20 DIAGNOSIS — Z881 Allergy status to other antibiotic agents status: Secondary | ICD-10-CM | POA: Insufficient documentation

## 2018-06-20 DIAGNOSIS — E7800 Pure hypercholesterolemia, unspecified: Secondary | ICD-10-CM | POA: Diagnosis not present

## 2018-06-20 DIAGNOSIS — I4891 Unspecified atrial fibrillation: Secondary | ICD-10-CM | POA: Diagnosis not present

## 2018-06-20 DIAGNOSIS — M810 Age-related osteoporosis without current pathological fracture: Secondary | ICD-10-CM | POA: Diagnosis not present

## 2018-06-20 DIAGNOSIS — Z96652 Presence of left artificial knee joint: Secondary | ICD-10-CM | POA: Insufficient documentation

## 2018-06-20 DIAGNOSIS — R102 Pelvic and perineal pain: Secondary | ICD-10-CM | POA: Diagnosis not present

## 2018-06-20 DIAGNOSIS — Z9842 Cataract extraction status, left eye: Secondary | ICD-10-CM | POA: Diagnosis not present

## 2018-06-20 DIAGNOSIS — Z803 Family history of malignant neoplasm of breast: Secondary | ICD-10-CM | POA: Diagnosis not present

## 2018-06-20 DIAGNOSIS — Z01812 Encounter for preprocedural laboratory examination: Secondary | ICD-10-CM | POA: Diagnosis not present

## 2018-06-20 DIAGNOSIS — Z888 Allergy status to other drugs, medicaments and biological substances status: Secondary | ICD-10-CM | POA: Diagnosis not present

## 2018-06-20 DIAGNOSIS — Z8249 Family history of ischemic heart disease and other diseases of the circulatory system: Secondary | ICD-10-CM | POA: Diagnosis not present

## 2018-06-20 DIAGNOSIS — I10 Essential (primary) hypertension: Secondary | ICD-10-CM | POA: Diagnosis not present

## 2018-06-20 DIAGNOSIS — Z9841 Cataract extraction status, right eye: Secondary | ICD-10-CM | POA: Diagnosis not present

## 2018-06-20 HISTORY — DX: Cardiac arrhythmia, unspecified: I49.9

## 2018-06-20 HISTORY — DX: Heart failure, unspecified: I50.9

## 2018-06-20 HISTORY — DX: Unspecified osteoarthritis, unspecified site: M19.90

## 2018-06-20 LAB — CBC
HCT: 37.9 % (ref 35.0–47.0)
HEMOGLOBIN: 13 g/dL (ref 12.0–16.0)
MCH: 28.2 pg (ref 26.0–34.0)
MCHC: 34.2 g/dL (ref 32.0–36.0)
MCV: 82.5 fL (ref 80.0–100.0)
PLATELETS: 261 10*3/uL (ref 150–440)
RBC: 4.6 MIL/uL (ref 3.80–5.20)
RDW: 14.3 % (ref 11.5–14.5)
WBC: 7.1 10*3/uL (ref 3.6–11.0)

## 2018-06-20 LAB — BASIC METABOLIC PANEL
ANION GAP: 10 (ref 5–15)
BUN: 15 mg/dL (ref 8–23)
CHLORIDE: 104 mmol/L (ref 98–111)
CO2: 26 mmol/L (ref 22–32)
Calcium: 8.8 mg/dL — ABNORMAL LOW (ref 8.9–10.3)
Creatinine, Ser: 0.3 mg/dL — ABNORMAL LOW (ref 0.44–1.00)
GFR calc Af Amer: >60 mL/min (ref >60)
Glucose, Bld: 102 mg/dL — ABNORMAL HIGH (ref 70–99)
Potassium: 3.4 mmol/L — ABNORMAL LOW (ref 3.5–5.1)
SODIUM: 140 mmol/L (ref 135–145)

## 2018-06-20 LAB — URINALYSIS, ROUTINE W REFLEX MICROSCOPIC
BILIRUBIN UA: NEGATIVE
BILIRUBIN URINE: NEGATIVE
GLUCOSE, UA: NEGATIVE
Glucose, UA: NEGATIVE mg/dL
HGB URINE DIPSTICK: NEGATIVE
KETONES UA: NEGATIVE
KETONES UR: NEGATIVE mg/dL
LEUKOCYTES UA: NEGATIVE
Leukocytes, UA: NEGATIVE
NITRITE UA: NEGATIVE
NITRITE: NEGATIVE
PH UA: 5.5 (ref 5.0–7.5)
PROTEIN: NEGATIVE mg/dL
Protein, UA: NEGATIVE
RBC, UA: NEGATIVE
Specific Gravity, UA: 1.019 (ref 1.005–1.030)
Specific Gravity, Urine: 1.008 (ref 1.005–1.030)
UUROB: 0.2 mg/dL (ref 0.2–1.0)
pH: 5 (ref 5.0–8.0)

## 2018-06-20 LAB — SURGICAL PCR SCREEN
MRSA, PCR: NEGATIVE
Staphylococcus aureus: NEGATIVE

## 2018-06-20 LAB — TYPE AND SCREEN
ABO/RH(D): AB POS
Antibody Screen: NEGATIVE

## 2018-06-20 LAB — PROTIME-INR
INR: 1.18
PROTHROMBIN TIME: 14.9 s (ref 11.4–15.2)

## 2018-06-20 LAB — APTT: APTT: 36 s (ref 24–36)

## 2018-06-20 NOTE — Patient Instructions (Signed)
Your procedure is scheduled on: Tues. 06/27/18 Report to Day Surgery. To find out your arrival time please call [phone] between 1PM - 3PM on Mon 06/26/18.  Remember: Instructions that are not followed completely may result in serious medical risk,  up to and including death, or upon the discretion of your surgeon and anesthesiologist your  surgery may need to be rescheduled.     _X__ 1. Do not eat food after midnight the night before your procedure.                 No gum chewing or hard candies. You may drink clear liquids up to 2 hours                 before you are scheduled to arrive for your surgery- DO not drink clear                 liquids within 2 hours of the start of your surgery.                 Clear Liquids include:  water, apple juice without pulp, clear carbohydrate                 drink such as Clearfast of Gartorade, Black Coffee or Tea (Do not add                 anything to coffee or tea).  __X__2.  On the morning of surgery brush your teeth with toothpaste and water, you                may rinse your mouth with mouthwash if you wish.  Do not swallow any toothpaste of mouthwash.     _X__ 3.  No Alcohol for 24 hours before or after surgery.   _X__ 4.  Do Not Smoke or use e-cigarettes For 24 Hours Prior to Your Surgery.                 Do not use any chewable tobacco products for at least 6 hours prior to                 surgery.  ____  5.  Bring all medications with you on the day of surgery if instructed.   __x__  6.  Notify your doctor if there is any change in your medical condition      (cold, fever, infections).     Do not wear jewelry, make-up, hairpins, clips or nail polish. Do not wear lotions, powders, or perfumes. You may wear deodorant. Do not shave 48 hours prior to surgery. Men may shave face and neck. Do not bring valuables to the hospital.    [facility] is not responsible for any belongings or valuables.  Contacts,  dentures or bridgework may not be worn into surgery. Leave your suitcase in the car. After surgery it may be brought to your room. For patients admitted to the hospital, discharge time is determined by your treatment team.   Patients discharged the day of surgery will not be allowed to drive home.   Please read over the following fact sheets that you were given:    __x__ Take these medicines the morning of surgery with A SIP OF WATER:    1. acetaminophen (TYLENOL) 500 MG tablet as needed  2. flecainide (TAMBOCOR) 50 MG tablet  3. hydrALAZINE (APRESOLINE) 25 MG tablet  4.timolol (TIMOPTIC) 0.5 % ophthalmic solution  5.  6.  ____ [employer]  Enema (as directed)   _x___ Use CHG Soap as directed  ____ Use inhalers on the day of surgery  ____ Stop metformin 2 days prior to surgery    ____ Take 1/2 of usual insulin dose the night before surgery. No insulin the morning          of surgery.   __x__ Stop  rivaroxaban (XARELTO) 20 MG TABS tablet Last dose on Friday 6/28  _x___ Stop Anti-inflammatories diclofenac sodium (VOLTAREN) 1 % GEL  On Friday 6/28   ____ Stop supplements until after surgery.    ____ Bring C-Pap to the hospital.

## 2018-06-21 LAB — URINE CULTURE

## 2018-06-22 ENCOUNTER — Encounter: Payer: Self-pay | Admitting: Internal Medicine

## 2018-06-26 MED ORDER — CEFAZOLIN SODIUM-DEXTROSE 2-4 GM/100ML-% IV SOLN
2.0000 g | Freq: Once | INTRAVENOUS | Status: AC
  Administered 2018-06-27: 2 g via INTRAVENOUS

## 2018-06-27 ENCOUNTER — Inpatient Hospital Stay: Payer: Medicare Other | Admitting: Certified Registered"

## 2018-06-27 ENCOUNTER — Encounter: Payer: Self-pay | Admitting: *Deleted

## 2018-06-27 ENCOUNTER — Encounter
Admission: RE | Disposition: A | Discharge status: Skilled Nursing Facility | Payer: Self-pay | Source: Home / Self Care | Attending: Surgery

## 2018-06-27 ENCOUNTER — Other Ambulatory Visit: Payer: Self-pay

## 2018-06-27 ENCOUNTER — Inpatient Hospital Stay
Admission: RE | Admit: 2018-06-27 | Discharge: 2018-06-30 | DRG: 483 | Disposition: A | Discharge status: Skilled Nursing Facility | Payer: Medicare Other | Attending: Surgery | Admitting: Surgery

## 2018-06-27 ENCOUNTER — Inpatient Hospital Stay: Payer: Medicare Other

## 2018-06-27 DIAGNOSIS — Z9181 History of falling: Secondary | ICD-10-CM | POA: Diagnosis not present

## 2018-06-27 DIAGNOSIS — R262 Difficulty in walking, not elsewhere classified: Secondary | ICD-10-CM | POA: Diagnosis not present

## 2018-06-27 DIAGNOSIS — M810 Age-related osteoporosis without current pathological fracture: Secondary | ICD-10-CM | POA: Diagnosis present

## 2018-06-27 DIAGNOSIS — I440 Atrioventricular block, first degree: Secondary | ICD-10-CM | POA: Diagnosis present

## 2018-06-27 DIAGNOSIS — M25511 Pain in right shoulder: Secondary | ICD-10-CM | POA: Diagnosis not present

## 2018-06-27 DIAGNOSIS — H35033 Hypertensive retinopathy, bilateral: Secondary | ICD-10-CM | POA: Diagnosis not present

## 2018-06-27 DIAGNOSIS — M6281 Muscle weakness (generalized): Secondary | ICD-10-CM | POA: Diagnosis not present

## 2018-06-27 DIAGNOSIS — Z7401 Bed confinement status: Secondary | ICD-10-CM | POA: Diagnosis not present

## 2018-06-27 DIAGNOSIS — Z888 Allergy status to other drugs, medicaments and biological substances status: Secondary | ICD-10-CM

## 2018-06-27 DIAGNOSIS — Z6836 Body mass index (BMI) 36.0-36.9, adult: Secondary | ICD-10-CM | POA: Diagnosis not present

## 2018-06-27 DIAGNOSIS — I110 Hypertensive heart disease with heart failure: Secondary | ICD-10-CM | POA: Diagnosis present

## 2018-06-27 DIAGNOSIS — R110 Nausea: Secondary | ICD-10-CM | POA: Diagnosis not present

## 2018-06-27 DIAGNOSIS — G8918 Other acute postprocedural pain: Secondary | ICD-10-CM | POA: Diagnosis not present

## 2018-06-27 DIAGNOSIS — Z79899 Other long term (current) drug therapy: Secondary | ICD-10-CM | POA: Diagnosis not present

## 2018-06-27 DIAGNOSIS — Z7901 Long term (current) use of anticoagulants: Secondary | ICD-10-CM

## 2018-06-27 DIAGNOSIS — K219 Gastro-esophageal reflux disease without esophagitis: Secondary | ICD-10-CM | POA: Diagnosis not present

## 2018-06-27 DIAGNOSIS — I509 Heart failure, unspecified: Secondary | ICD-10-CM | POA: Diagnosis present

## 2018-06-27 DIAGNOSIS — Z96653 Presence of artificial knee joint, bilateral: Secondary | ICD-10-CM | POA: Diagnosis present

## 2018-06-27 DIAGNOSIS — E669 Obesity, unspecified: Secondary | ICD-10-CM | POA: Diagnosis present

## 2018-06-27 DIAGNOSIS — Z8379 Family history of other diseases of the digestive system: Secondary | ICD-10-CM | POA: Diagnosis not present

## 2018-06-27 DIAGNOSIS — M19011 Primary osteoarthritis, right shoulder: Secondary | ICD-10-CM | POA: Diagnosis not present

## 2018-06-27 DIAGNOSIS — Z8249 Family history of ischemic heart disease and other diseases of the circulatory system: Secondary | ICD-10-CM

## 2018-06-27 DIAGNOSIS — M5441 Lumbago with sciatica, right side: Secondary | ICD-10-CM | POA: Diagnosis not present

## 2018-06-27 DIAGNOSIS — M12811 Other specific arthropathies, not elsewhere classified, right shoulder: Secondary | ICD-10-CM | POA: Diagnosis not present

## 2018-06-27 DIAGNOSIS — M7581 Other shoulder lesions, right shoulder: Secondary | ICD-10-CM | POA: Diagnosis not present

## 2018-06-27 DIAGNOSIS — M75121 Complete rotator cuff tear or rupture of right shoulder, not specified as traumatic: Secondary | ICD-10-CM | POA: Diagnosis present

## 2018-06-27 DIAGNOSIS — Z825 Family history of asthma and other chronic lower respiratory diseases: Secondary | ICD-10-CM

## 2018-06-27 DIAGNOSIS — I340 Nonrheumatic mitral (valve) insufficiency: Secondary | ICD-10-CM | POA: Diagnosis present

## 2018-06-27 DIAGNOSIS — E876 Hypokalemia: Secondary | ICD-10-CM | POA: Diagnosis present

## 2018-06-27 DIAGNOSIS — Z881 Allergy status to other antibiotic agents status: Secondary | ICD-10-CM | POA: Diagnosis not present

## 2018-06-27 DIAGNOSIS — M75101 Unspecified rotator cuff tear or rupture of right shoulder, not specified as traumatic: Secondary | ICD-10-CM | POA: Diagnosis not present

## 2018-06-27 DIAGNOSIS — I480 Paroxysmal atrial fibrillation: Secondary | ICD-10-CM | POA: Diagnosis present

## 2018-06-27 DIAGNOSIS — Z90710 Acquired absence of both cervix and uterus: Secondary | ICD-10-CM

## 2018-06-27 DIAGNOSIS — E7800 Pure hypercholesterolemia, unspecified: Secondary | ICD-10-CM | POA: Diagnosis not present

## 2018-06-27 DIAGNOSIS — R7303 Prediabetes: Secondary | ICD-10-CM | POA: Diagnosis not present

## 2018-06-27 DIAGNOSIS — Z471 Aftercare following joint replacement surgery: Secondary | ICD-10-CM | POA: Diagnosis not present

## 2018-06-27 DIAGNOSIS — G8929 Other chronic pain: Secondary | ICD-10-CM | POA: Diagnosis not present

## 2018-06-27 DIAGNOSIS — E7849 Other hyperlipidemia: Secondary | ICD-10-CM | POA: Diagnosis not present

## 2018-06-27 DIAGNOSIS — Z96611 Presence of right artificial shoulder joint: Secondary | ICD-10-CM | POA: Diagnosis not present

## 2018-06-27 HISTORY — DX: Gastro-esophageal reflux disease without esophagitis: K21.9

## 2018-06-27 HISTORY — DX: Adverse effect of unspecified anesthetic, initial encounter: T41.45XA

## 2018-06-27 HISTORY — DX: Other specified postprocedural states: R11.2

## 2018-06-27 HISTORY — DX: Other complications of anesthesia, initial encounter: T88.59XA

## 2018-06-27 HISTORY — PX: REVERSE SHOULDER ARTHROPLASTY: SHX5054

## 2018-06-27 HISTORY — DX: Other specified postprocedural states: Z98.890

## 2018-06-27 LAB — ABO/RH: ABO/RH(D): AB POS

## 2018-06-27 SURGERY — ARTHROPLASTY, SHOULDER, TOTAL, REVERSE
Anesthesia: General | Site: Shoulder | Laterality: Right | Wound class: Clean

## 2018-06-27 MED ORDER — SEVOFLURANE IN SOLN
RESPIRATORY_TRACT | Status: AC
  Filled 2018-06-27: qty 250

## 2018-06-27 MED ORDER — RIVAROXABAN 20 MG PO TABS
20.0000 mg | ORAL_TABLET | Freq: Every day | ORAL | Status: DC
  Administered 2018-06-28 – 2018-06-29 (×2): 20 mg via ORAL
  Filled 2018-06-27 (×2): qty 1

## 2018-06-27 MED ORDER — TIMOLOL MALEATE 0.5 % OP SOLN
1.0000 [drp] | Freq: Every day | OPHTHALMIC | Status: DC
  Administered 2018-06-28 – 2018-06-30 (×3): 1 [drp] via OPHTHALMIC
  Filled 2018-06-27: qty 5

## 2018-06-27 MED ORDER — ROCURONIUM BROMIDE 100 MG/10ML IV SOLN
INTRAVENOUS | Status: DC | PRN
  Administered 2018-06-27: 40 mg via INTRAVENOUS
  Administered 2018-06-27 (×2): 10 mg via INTRAVENOUS

## 2018-06-27 MED ORDER — BUPIVACAINE LIPOSOME 1.3 % IJ SUSP
INTRAMUSCULAR | Status: AC
  Filled 2018-06-27: qty 20

## 2018-06-27 MED ORDER — LORATADINE 10 MG PO TABS: 10.0000 mg | ORAL_TABLET | Freq: Every day | ORAL | Status: DC | PRN

## 2018-06-27 MED ORDER — MAGNESIUM HYDROXIDE 400 MG/5ML PO SUSP: 30.0000 mL | Freq: Every day | ORAL | Status: DC | PRN

## 2018-06-27 MED ORDER — BUPIVACAINE LIPOSOME 1.3 % IJ SUSP
INTRAMUSCULAR | Status: DC | PRN
  Administered 2018-06-27: 20 mL via PERINEURAL

## 2018-06-27 MED ORDER — BUPIVACAINE-EPINEPHRINE (PF) 0.5% -1:200000 IJ SOLN
INTRAMUSCULAR | Status: AC
  Filled 2018-06-27: qty 30

## 2018-06-27 MED ORDER — ONDANSETRON HCL 4 MG/2ML IJ SOLN: 4.0000 mg | Freq: Once | INTRAMUSCULAR | Status: DC | PRN

## 2018-06-27 MED ORDER — PROPOFOL 10 MG/ML IV BOLUS
INTRAVENOUS | Status: AC
  Filled 2018-06-27: qty 20

## 2018-06-27 MED ORDER — EPHEDRINE SULFATE 50 MG/ML IJ SOLN
INTRAMUSCULAR | Status: AC
  Filled 2018-06-27: qty 1

## 2018-06-27 MED ORDER — ONDANSETRON HCL 4 MG/2ML IJ SOLN
INTRAMUSCULAR | Status: DC | PRN
  Administered 2018-06-27: 4 mg via INTRAVENOUS

## 2018-06-27 MED ORDER — METOCLOPRAMIDE HCL 5 MG/ML IJ SOLN
5.0000 mg | Freq: Three times a day (TID) | INTRAMUSCULAR | Status: DC | PRN
  Administered 2018-06-27: 10 mg via INTRAVENOUS
  Filled 2018-06-27: qty 2

## 2018-06-27 MED ORDER — FENTANYL CITRATE (PF) 100 MCG/2ML IJ SOLN: 25.0000 ug | INTRAMUSCULAR | Status: DC | PRN

## 2018-06-27 MED ORDER — PROPOFOL 500 MG/50ML IV EMUL
INTRAVENOUS | Status: AC
  Filled 2018-06-27: qty 50

## 2018-06-27 MED ORDER — FLEET ENEMA 7-19 GM/118ML RE ENEM: 1.0000 | ENEMA | Freq: Once | RECTAL | Status: DC | PRN

## 2018-06-27 MED ORDER — BUPIVACAINE HCL (PF) 0.5 % IJ SOLN
INTRAMUSCULAR | Status: AC
  Filled 2018-06-27: qty 10

## 2018-06-27 MED ORDER — FENTANYL CITRATE (PF) 100 MCG/2ML IJ SOLN
50.0000 ug | Freq: Once | INTRAMUSCULAR | Status: AC
  Administered 2018-06-27: 50 ug via INTRAVENOUS

## 2018-06-27 MED ORDER — SUGAMMADEX SODIUM 200 MG/2ML IV SOLN
INTRAVENOUS | Status: AC
  Filled 2018-06-27: qty 2

## 2018-06-27 MED ORDER — ACETAMINOPHEN 325 MG PO TABS
325.0000 mg | ORAL_TABLET | Freq: Four times a day (QID) | ORAL | Status: DC | PRN
  Administered 2018-06-29 – 2018-06-30 (×2): 650 mg via ORAL
  Filled 2018-06-27 (×2): qty 2

## 2018-06-27 MED ORDER — BUPIVACAINE-EPINEPHRINE (PF) 0.5% -1:200000 IJ SOLN
INTRAMUSCULAR | Status: DC | PRN
  Administered 2018-06-27: 30 mL via PERINEURAL

## 2018-06-27 MED ORDER — FAMOTIDINE 20 MG PO TABS
20.0000 mg | ORAL_TABLET | Freq: Once | ORAL | Status: AC
  Administered 2018-06-27: 20 mg via ORAL

## 2018-06-27 MED ORDER — LOSARTAN POTASSIUM 50 MG PO TABS
100.0000 mg | ORAL_TABLET | Freq: Every day | ORAL | Status: DC
  Administered 2018-06-27 – 2018-06-30 (×4): 100 mg via ORAL
  Filled 2018-06-27 (×4): qty 2

## 2018-06-27 MED ORDER — PRAVASTATIN SODIUM 20 MG PO TABS
20.0000 mg | ORAL_TABLET | Freq: Every evening | ORAL | Status: DC
  Administered 2018-06-29: 20 mg via ORAL
  Filled 2018-06-27 (×2): qty 1

## 2018-06-27 MED ORDER — FENTANYL CITRATE (PF) 100 MCG/2ML IJ SOLN
INTRAMUSCULAR | Status: AC
  Administered 2018-06-27: 50 ug via INTRAVENOUS
  Filled 2018-06-27: qty 2

## 2018-06-27 MED ORDER — CALCIUM CARBONATE ANTACID 500 MG PO CHEW: 1.0000 | CHEWABLE_TABLET | Freq: Every day | ORAL | Status: DC | PRN

## 2018-06-27 MED ORDER — DEXAMETHASONE SODIUM PHOSPHATE 10 MG/ML IJ SOLN
INTRAMUSCULAR | Status: AC
  Filled 2018-06-27: qty 1

## 2018-06-27 MED ORDER — HYDRALAZINE HCL 25 MG PO TABS
25.0000 mg | ORAL_TABLET | Freq: Two times a day (BID) | ORAL | Status: DC
  Administered 2018-06-27 – 2018-06-30 (×6): 25 mg via ORAL
  Filled 2018-06-27 (×6): qty 1

## 2018-06-27 MED ORDER — SUGAMMADEX SODIUM 200 MG/2ML IV SOLN
INTRAVENOUS | Status: DC | PRN
  Administered 2018-06-27: 180 mg via INTRAVENOUS

## 2018-06-27 MED ORDER — ONDANSETRON HCL 4 MG PO TABS: 4.0000 mg | ORAL_TABLET | Freq: Four times a day (QID) | ORAL | Status: DC | PRN

## 2018-06-27 MED ORDER — POTASSIUM CHLORIDE CRYS ER 20 MEQ PO TBCR
20.0000 meq | EXTENDED_RELEASE_TABLET | Freq: Every day | ORAL | Status: DC
  Administered 2018-06-27 – 2018-06-29 (×3): 20 meq via ORAL
  Filled 2018-06-27 (×3): qty 1

## 2018-06-27 MED ORDER — ONDANSETRON HCL 4 MG/2ML IJ SOLN
INTRAMUSCULAR | Status: AC
  Filled 2018-06-27: qty 2

## 2018-06-27 MED ORDER — TRANEXAMIC ACID 1000 MG/10ML IV SOLN
INTRAVENOUS | Status: DC | PRN
  Administered 2018-06-27: 1000 mg via INTRAVENOUS

## 2018-06-27 MED ORDER — CEFAZOLIN SODIUM-DEXTROSE 2-4 GM/100ML-% IV SOLN
INTRAVENOUS | Status: AC
  Filled 2018-06-27: qty 100

## 2018-06-27 MED ORDER — TRANEXAMIC ACID 1000 MG/10ML IV SOLN
INTRAVENOUS | Status: AC
  Filled 2018-06-27: qty 10

## 2018-06-27 MED ORDER — FAMOTIDINE 20 MG PO TABS
ORAL_TABLET | ORAL | Status: AC
  Administered 2018-06-27: 20 mg via ORAL
  Filled 2018-06-27: qty 1

## 2018-06-27 MED ORDER — LIDOCAINE HCL (PF) 1 % IJ SOLN
INTRAMUSCULAR | Status: DC | PRN
  Administered 2018-06-27: 5 mL via SUBCUTANEOUS

## 2018-06-27 MED ORDER — NEOMYCIN-POLYMYXIN B GU 40-200000 IR SOLN
Status: AC
  Filled 2018-06-27: qty 20

## 2018-06-27 MED ORDER — DEXAMETHASONE SODIUM PHOSPHATE 10 MG/ML IJ SOLN
INTRAMUSCULAR | Status: DC | PRN
  Administered 2018-06-27: 5 mg via INTRAVENOUS

## 2018-06-27 MED ORDER — ONDANSETRON HCL 4 MG/2ML IJ SOLN
4.0000 mg | Freq: Four times a day (QID) | INTRAMUSCULAR | Status: DC | PRN
  Administered 2018-06-27: 4 mg via INTRAVENOUS
  Filled 2018-06-27: qty 2

## 2018-06-27 MED ORDER — CEFAZOLIN SODIUM-DEXTROSE 2-4 GM/100ML-% IV SOLN
2.0000 g | Freq: Four times a day (QID) | INTRAVENOUS | Status: AC
Start: 2018-06-27 — End: 2018-06-28
  Administered 2018-06-27 – 2018-06-28 (×3): 2 g via INTRAVENOUS
  Filled 2018-06-27 (×3): qty 100

## 2018-06-27 MED ORDER — DIPHENHYDRAMINE HCL 12.5 MG/5ML PO ELIX: 12.5000 mg | ORAL_SOLUTION | ORAL | Status: DC | PRN

## 2018-06-27 MED ORDER — BUPIVACAINE HCL (PF) 0.5 % IJ SOLN
INTRAMUSCULAR | Status: DC | PRN
  Administered 2018-06-27: 10 mL via PERINEURAL

## 2018-06-27 MED ORDER — OXYCODONE HCL 5 MG PO TABS
5.0000 mg | ORAL_TABLET | ORAL | Status: DC | PRN
  Administered 2018-06-29: 5 mg via ORAL
  Filled 2018-06-27: qty 1

## 2018-06-27 MED ORDER — LIDOCAINE HCL (CARDIAC) PF 100 MG/5ML IV SOSY
PREFILLED_SYRINGE | INTRAVENOUS | Status: DC | PRN
  Administered 2018-06-27: 80 mg via INTRAVENOUS

## 2018-06-27 MED ORDER — PANTOPRAZOLE SODIUM 40 MG PO TBEC
40.0000 mg | DELAYED_RELEASE_TABLET | Freq: Every day | ORAL | Status: DC
  Administered 2018-06-27 – 2018-06-30 (×3): 40 mg via ORAL
  Filled 2018-06-27 (×4): qty 1

## 2018-06-27 MED ORDER — LIDOCAINE HCL (PF) 1 % IJ SOLN
INTRAMUSCULAR | Status: AC
  Filled 2018-06-27: qty 5

## 2018-06-27 MED ORDER — LIDOCAINE HCL (PF) 2 % IJ SOLN
INTRAMUSCULAR | Status: AC
  Filled 2018-06-27: qty 10

## 2018-06-27 MED ORDER — MIDAZOLAM HCL 2 MG/2ML IJ SOLN: 1.0000 mg | Freq: Once | INTRAMUSCULAR | Status: DC

## 2018-06-27 MED ORDER — METOCLOPRAMIDE HCL 10 MG PO TABS: 5.0000 mg | ORAL_TABLET | Freq: Three times a day (TID) | ORAL | Status: DC | PRN

## 2018-06-27 MED ORDER — PROPOFOL 10 MG/ML IV BOLUS
INTRAVENOUS | Status: DC | PRN
  Administered 2018-06-27: 160 mg via INTRAVENOUS

## 2018-06-27 MED ORDER — BISACODYL 10 MG RE SUPP
10.0000 mg | Freq: Every day | RECTAL | Status: DC | PRN
Start: 2018-06-27 — End: 2018-06-30

## 2018-06-27 MED ORDER — PROPOFOL 10 MG/ML IV BOLUS
INTRAVENOUS | Status: AC
  Filled 2018-06-27: qty 60

## 2018-06-27 MED ORDER — SUCCINYLCHOLINE CHLORIDE 20 MG/ML IJ SOLN
INTRAMUSCULAR | Status: DC | PRN
  Administered 2018-06-27: 120 mg via INTRAVENOUS

## 2018-06-27 MED ORDER — DILTIAZEM HCL ER COATED BEADS 120 MG PO CP24
120.0000 mg | ORAL_CAPSULE | Freq: Every day | ORAL | Status: DC
  Administered 2018-06-27 – 2018-06-29 (×3): 120 mg via ORAL
  Filled 2018-06-27 (×4): qty 1

## 2018-06-27 MED ORDER — VITAMIN D 1000 UNITS PO TABS
1000.0000 [IU] | ORAL_TABLET | Freq: Every day | ORAL | Status: DC
  Administered 2018-06-27 – 2018-06-30 (×3): 1000 [IU] via ORAL
  Filled 2018-06-27 (×3): qty 1

## 2018-06-27 MED ORDER — TRAMADOL HCL 50 MG PO TABS: 50.0000 mg | ORAL_TABLET | Freq: Four times a day (QID) | ORAL | Status: DC | PRN

## 2018-06-27 MED ORDER — FUROSEMIDE 40 MG PO TABS
40.0000 mg | ORAL_TABLET | Freq: Every day | ORAL | Status: DC
  Administered 2018-06-27 – 2018-06-30 (×3): 40 mg via ORAL
  Filled 2018-06-27 (×4): qty 1

## 2018-06-27 MED ORDER — PHENYLEPHRINE HCL 10 MG/ML IJ SOLN
INTRAMUSCULAR | Status: DC | PRN
  Administered 2018-06-27: 50 ug via INTRAVENOUS
  Administered 2018-06-27: 100 ug via INTRAVENOUS
  Administered 2018-06-27: 50 ug via INTRAVENOUS
  Administered 2018-06-27: 100 ug via INTRAVENOUS

## 2018-06-27 MED ORDER — SODIUM CHLORIDE 0.9 % IV SOLN
INTRAVENOUS | Status: DC | PRN
  Administered 2018-06-27: 20 ug/min via INTRAVENOUS

## 2018-06-27 MED ORDER — FENTANYL CITRATE (PF) 100 MCG/2ML IJ SOLN
INTRAMUSCULAR | Status: DC | PRN
  Administered 2018-06-27: 100 ug via INTRAVENOUS

## 2018-06-27 MED ORDER — MIDAZOLAM HCL 2 MG/2ML IJ SOLN
INTRAMUSCULAR | Status: AC
  Filled 2018-06-27: qty 2

## 2018-06-27 MED ORDER — SODIUM CHLORIDE 0.9 % IV SOLN
INTRAVENOUS | Status: DC
  Administered 2018-06-27: 13:00:00 via INTRAVENOUS

## 2018-06-27 MED ORDER — NEOMYCIN-POLYMYXIN B GU 40-200000 IR SOLN
Status: DC | PRN
  Administered 2018-06-27: 14 mL

## 2018-06-27 MED ORDER — FENTANYL CITRATE (PF) 100 MCG/2ML IJ SOLN
INTRAMUSCULAR | Status: AC
  Filled 2018-06-27: qty 2

## 2018-06-27 MED ORDER — ROCURONIUM BROMIDE 50 MG/5ML IV SOLN
INTRAVENOUS | Status: AC
  Filled 2018-06-27: qty 1

## 2018-06-27 MED ORDER — ACETAMINOPHEN 500 MG PO TABS
1000.0000 mg | ORAL_TABLET | Freq: Four times a day (QID) | ORAL | Status: AC
  Administered 2018-06-27 (×2): 1000 mg via ORAL
  Filled 2018-06-27 (×3): qty 2

## 2018-06-27 MED ORDER — DOCUSATE SODIUM 100 MG PO CAPS
100.0000 mg | ORAL_CAPSULE | Freq: Two times a day (BID) | ORAL | Status: DC
  Administered 2018-06-28 – 2018-06-30 (×4): 100 mg via ORAL
  Filled 2018-06-27 (×4): qty 1

## 2018-06-27 MED ORDER — HYDROMORPHONE HCL 1 MG/ML IJ SOLN: 0.5000 mg | INTRAMUSCULAR | Status: DC | PRN

## 2018-06-27 MED ORDER — ENOXAPARIN SODIUM 40 MG/0.4ML ~~LOC~~ SOLN: 40.0000 mg | SUBCUTANEOUS | Status: DC

## 2018-06-27 MED ORDER — HYDRALAZINE HCL 20 MG/ML IJ SOLN
10.0000 mg | INTRAMUSCULAR | Status: DC | PRN
  Administered 2018-06-27: 10 mg via INTRAVENOUS
  Filled 2018-06-27: qty 1

## 2018-06-27 MED ORDER — SUCCINYLCHOLINE CHLORIDE 20 MG/ML IJ SOLN
INTRAMUSCULAR | Status: AC
  Filled 2018-06-27: qty 1

## 2018-06-27 MED ORDER — FLECAINIDE ACETATE 50 MG PO TABS
50.0000 mg | ORAL_TABLET | Freq: Two times a day (BID) | ORAL | Status: DC
  Administered 2018-06-27 – 2018-06-30 (×6): 50 mg via ORAL
  Filled 2018-06-27 (×8): qty 1

## 2018-06-27 MED ORDER — LACTATED RINGERS IV SOLN
INTRAVENOUS | Status: DC
  Administered 2018-06-27: 50 mL/h via INTRAVENOUS

## 2018-06-27 MED ORDER — PROPOFOL 500 MG/50ML IV EMUL
INTRAVENOUS | Status: DC | PRN
  Administered 2018-06-27: 175 ug/kg/min via INTRAVENOUS

## 2018-06-27 SURGICAL SUPPLY — 64 items
BIT DRILL 2.5 (BIT) ×1
BIT DRILL 2.5X4.5XSCR (BIT) ×1 IMPLANT
BIT DRL 2.5X4.5XSCR (BIT) ×1
BLADE SAGITTAL WIDE XTHICK NO (BLADE) ×3 IMPLANT
CANISTER SUCT 1200ML W/VALVE (MISCELLANEOUS) ×3 IMPLANT
CANISTER SUCT 3000ML PPV (MISCELLANEOUS) ×6 IMPLANT
CHLORAPREP W/TINT 26ML (MISCELLANEOUS) ×3 IMPLANT
COOLER POLAR GLACIER W/PUMP (MISCELLANEOUS) ×3 IMPLANT
CRADLE LAMINECT ARM (MISCELLANEOUS) ×3 IMPLANT
DRAPE IMP U-DRAPE 54X76 (DRAPES) ×6 IMPLANT
DRAPE INCISE IOBAN 66X45 STRL (DRAPES) ×6 IMPLANT
DRAPE INCISE IOBAN 66X60 STRL (DRAPES) ×3 IMPLANT
DRAPE SHEET LG 3/4 BI-LAMINATE (DRAPES) ×6 IMPLANT
DRAPE TABLE BACK 80X90 (DRAPES) ×3 IMPLANT
DRILL 2.5MM (BIT) ×1
DRSG OPSITE POSTOP 4X8 (GAUZE/BANDAGES/DRESSINGS) ×3 IMPLANT
ELECT BLADE 6.5 EXT (BLADE) ×3 IMPLANT
ELECT CAUTERY BLADE 6.4 (BLADE) ×3 IMPLANT
GLENOSPHERE RSS 2 CONCENTRIC (Shoulder) ×3 IMPLANT
GLOVE BIO SURGEON STRL SZ7.5 (GLOVE) ×12 IMPLANT
GLOVE BIO SURGEON STRL SZ8 (GLOVE) ×12 IMPLANT
GLOVE BIOGEL PI IND STRL 8 (GLOVE) ×1 IMPLANT
GLOVE BIOGEL PI INDICATOR 8 (GLOVE) ×2
GLOVE INDICATOR 8.0 STRL GRN (GLOVE) ×3 IMPLANT
GOWN STRL REUS W/ TWL LRG LVL3 (GOWN DISPOSABLE) ×1 IMPLANT
GOWN STRL REUS W/ TWL XL LVL3 (GOWN DISPOSABLE) ×1 IMPLANT
GOWN STRL REUS W/TWL LRG LVL3 (GOWN DISPOSABLE) ×2
GOWN STRL REUS W/TWL XL LVL3 (GOWN DISPOSABLE) ×2
GUIDE PIN 2.0 S150MM (PIN) ×3 IMPLANT
HOOD PEEL AWAY FLYTE STAYCOOL (MISCELLANEOUS) ×9 IMPLANT
KIT STABILIZATION SHOULDER (MISCELLANEOUS) ×3 IMPLANT
KIT TURNOVER KIT A (KITS) ×3 IMPLANT
LINER SHOULDER STD 3 (Liner) ×3 IMPLANT
MASK FACE SPIDER DISP (MASK) ×3 IMPLANT
NDL SAFETY ECLIPSE 18X1.5 (NEEDLE) ×1 IMPLANT
NEEDLE HYPO 18GX1.5 SHARP (NEEDLE) ×2
NEEDLE HYPO 22GX1.5 SAFETY (NEEDLE) ×3 IMPLANT
NEEDLE SPNL 20GX3.5 QUINCKE YW (NEEDLE) ×3 IMPLANT
NS IRRIG 500ML POUR BTL (IV SOLUTION) ×3 IMPLANT
PACK ARTHROSCOPY SHOULDER (MISCELLANEOUS) ×3 IMPLANT
PAD WRAPON POLAR SHDR UNIV (MISCELLANEOUS) ×1 IMPLANT
PLATE BASE REVERSE RSS S (Plate) ×3 IMPLANT
PULSAVAC PLUS IRRIG FAN TIP (DISPOSABLE) ×3
SCREW 4.5X15 RSS W CAP (Screw) ×9 IMPLANT
SCREW 4.5X20 RSS W CAP (Screw) ×3 IMPLANT
SCREW 4.5X35 RSS W CAP (Screw) ×3 IMPLANT
SCREW BODY REVERSE STD (Screw) ×3 IMPLANT
SLING ULTRA II M (MISCELLANEOUS) ×3 IMPLANT
SOL .9 NS 3000ML IRR  AL (IV SOLUTION) ×2
SOL .9 NS 3000ML IRR UROMATIC (IV SOLUTION) ×1 IMPLANT
SPONGE LAP 18X18 RF (DISPOSABLE) IMPLANT
STAPLER SKIN PROX 35W (STAPLE) ×3 IMPLANT
STEM HUM 11 (Stem) ×3 IMPLANT
SUT ETHIBOND 0 MO6 C/R (SUTURE) ×3 IMPLANT
SUT FIBERWIRE #2 38 BLUE 1/2 (SUTURE) ×12
SUT VIC AB 0 CT1 36 (SUTURE) ×6 IMPLANT
SUT VIC AB 2-0 CT1 27 (SUTURE) ×4
SUT VIC AB 2-0 CT1 TAPERPNT 27 (SUTURE) ×2 IMPLANT
SUTURE FIBERWR #2 38 BLUE 1/2 (SUTURE) ×4 IMPLANT
SYR 10ML LL (SYRINGE) ×3 IMPLANT
SYR 30ML LL (SYRINGE) ×6 IMPLANT
TIP FAN IRRIG PULSAVAC PLUS (DISPOSABLE) ×1 IMPLANT
TRAY FOLEY MTR SLVR 16FR STAT (SET/KITS/TRAYS/PACK) IMPLANT
WRAPON POLAR PAD SHDR UNIV (MISCELLANEOUS) ×3

## 2018-06-27 NOTE — Anesthesia Procedure Notes (Addendum)
Procedure Name: Intubation Date/Time: 06/27/2018 8:24 AM Performed by: Salomon Fick, RN Pre-anesthesia Checklist: Patient identified, Patient being monitored, Timeout performed, Emergency Drugs available and Suction available Patient Re-evaluated:Patient Re-evaluated prior to induction Oxygen Delivery Method: Circle system utilized Preoxygenation: Pre-oxygenation with 100% oxygen Induction Type: IV induction Ventilation: Mask ventilation without difficulty Laryngoscope Size: Mac and 3 Grade View: Grade I Tube type: Oral Tube size: 7.0 mm Number of attempts: 1 Airway Equipment and Method: Stylet Placement Confirmation: ETT inserted through vocal cords under direct vision,  positive ETCO2 and breath sounds checked- equal and bilateral Secured at: 22 cm Tube secured with: Tape Dental Injury: Teeth and Oropharynx as per pre-operative assessment

## 2018-06-27 NOTE — NC FL2 (Signed)
[place] LEVEL OF CARE SCREENING TOOL     IDENTIFICATION  Patient Name: Tina Howard Birthdate: September 10, 1933 Sex: female Admission Date (Current Location): 06/27/2018  [place] and [place] Number:  Engineering geologist and Address:  [facility], [address], [place], [place] [address]      Provider Number: [id number]  Attending Physician Name and Address:  Corky Mull, MD  Relative Name and Phone Number:       Current Level of Care: Hospital Recommended Level of Care: [place] Prior Approval Number:    Date Approved/Denied:   PASRR Number: [id number])  Discharge Plan: SNF    Current Diagnoses: Patient Active Problem List   Diagnosis Date Noted  . 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 Hoarseness 03/06/2018  . HLD (hyperlipidemia) 12/29/2017  . SOB (shortness of breath) 12/29/2017  . Cardiac murmur 12/29/2017  . Mitral regurgitation 12/29/2017  . Osteopenia 12/01/2017  . Fall at home, subsequent encounter 08/02/2017  . First degree AV block 08/02/2017  . Paroxysmal A-fib ([place]) 05/26/2016  . Essential hypertension 05/26/2016  . Hypokalemia 05/26/2016  . Seasonal allergies 05/06/2016  . Obesity (BMI 30-39.9) 11/13/2014  . Back pain, chronic 05/08/2014  . Prediabetes 05/08/2014  . Screening for breast cancer 06/27/2012  . 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 Osteoarthrosis 08/20/2011    Orientation RESPIRATION BLADDER Height & Weight     Self, Time, Situation, Place  Normal Continent Weight: 195 lb (88.5 kg) Height:  5\' 1"  (154.9 cm)  BEHAVIORAL SYMPTOMS/MOOD NEUROLOGICAL BOWEL NUTRITION STATUS      Continent Diet(Diet: Clear Liquid to be Advanced. )  AMBULATORY STATUS COMMUNICATION OF NEEDS Skin   Extensive Assist Verbally Surgical wounds(Incision: Right Shoulder )                       Personal Care Assistance Level of Assistance   Bathing, Feeding, Dressing Bathing Assistance: Limited assistance Feeding assistance: Independent Dressing Assistance: Limited assistance     Functional Limitations Info  Sight, Hearing, Speech Sight Info: Adequate Hearing Info: Adequate Speech Info: Adequate    SPECIAL CARE FACTORS FREQUENCY  PT (By licensed PT), OT (By licensed OT)     PT Frequency: (5) OT Frequency: (5)            Contractures      Additional Factors Info  Code Status, Allergies Code Status Info: (Full Code. ) Allergies Info: (Amlodipine, Azo Urinary Tract Support, Erythromycin)           Current Medications (06/27/2018):  This is the current hospital active medication list Current Facility-Administered Medications  Medication Dose Route Frequency Provider Last Rate Last Dose  . 0.9 %  sodium chloride infusion   Intravenous Continuous Poggi, Marshall Cork, MD 75 mL/hr at 06/27/18 1307    . acetaminophen (TYLENOL) tablet 1,000 mg  1,000 mg Oral Q6H Poggi, Marshall Cork, MD   1,000 mg at 06/27/18 1310  . [START ON 06/28/2018] acetaminophen (TYLENOL) tablet 325-650 mg  325-650 mg Oral Q6H PRN Poggi, Marshall Cork, MD      . bisacodyl (DULCOLAX) suppository 10 mg  10 mg Rectal Daily PRN Poggi, Marshall Cork, MD      . calcium carbonate (TUMS - dosed in mg elemental calcium) chewable tablet 200 mg of elemental calcium  1 tablet Oral Daily PRN Poggi, Marshall Cork, MD      . ceFAZolin (ANCEF) IVPB 2g/100 mL premix  2  g Intravenous Q6H Poggi, Marshall Cork, MD   Stopped at 06/27/18 1450  . cholecalciferol (VITAMIN D) tablet 1,000 Units  1,000 Units Oral Daily Poggi, Marshall Cork, MD   1,000 Units at 06/27/18 1309  . diltiazem (CARDIZEM CD) 24 hr capsule 120 mg  120 mg Oral Q2000 Poggi, Marshall Cork, MD      . diphenhydrAMINE (BENADRYL) 12.5 MG/5ML elixir 12.5-25 mg  12.5-25 mg Oral Q4H PRN Poggi, Marshall Cork, MD      . docusate sodium (COLACE) capsule 100 mg  100 mg Oral BID Poggi, Marshall Cork, MD      . Derrill Memo ON 06/28/2018] enoxaparin (LOVENOX) injection 40 mg  40 mg  Subcutaneous Q24H Poggi, Marshall Cork, MD      . flecainide (TAMBOCOR) tablet 50 mg  50 mg Oral BID Poggi, Marshall Cork, MD      . furosemide (LASIX) tablet 40 mg  40 mg Oral Daily Poggi, Marshall Cork, MD   40 mg at 06/27/18 1310  . hydrALAZINE (APRESOLINE) tablet 25 mg  25 mg Oral BID Poggi, Marshall Cork, MD      . HYDROmorphone (DILAUDID) injection 0.5-1 mg  0.5-1 mg Intravenous Q4H PRN Poggi, Marshall Cork, MD      . loratadine (CLARITIN) tablet 10 mg  10 mg Oral Daily PRN Poggi, Marshall Cork, MD      . losartan (COZAAR) tablet 100 mg  100 mg Oral Daily Poggi, Marshall Cork, MD   100 mg at 06/27/18 1310  . magnesium hydroxide (MILK OF MAGNESIA) suspension 30 mL  30 mL Oral Daily PRN Poggi, Marshall Cork, MD      . metoCLOPramide (REGLAN) tablet 5-10 mg  5-10 mg Oral Q8H PRN Poggi, Marshall Cork, MD       Or  . metoCLOPramide (REGLAN) injection 5-10 mg  5-10 mg Intravenous Q8H PRN Poggi, Marshall Cork, MD      . midazolam (VERSED) 2 MG/2ML injection           . ondansetron (ZOFRAN) tablet 4 mg  4 mg Oral Q6H PRN Poggi, Marshall Cork, MD       Or  . ondansetron [facility]) injection 4 mg  4 mg Intravenous Q6H PRN Poggi, Marshall Cork, MD   4 mg at 06/27/18 1359  . oxyCODONE (Oxy IR/ROXICODONE) immediate release tablet 5-10 mg  5-10 mg Oral Q4H PRN Poggi, Marshall Cork, MD      . pantoprazole (PROTONIX) EC tablet 40 mg  40 mg Oral Daily Poggi, Marshall Cork, MD   40 mg at 06/27/18 1311  . potassium chloride SA (K-DUR,KLOR-CON) CR tablet 20 mEq  20 mEq Oral Daily Poggi, Marshall Cork, MD   20 mEq at 06/27/18 1310  . pravastatin (PRAVACHOL) tablet 20 mg  20 mg Oral QPM Poggi, Marshall Cork, MD      . Derrill Memo ON 06/28/2018] rivaroxaban (XARELTO) tablet 20 mg  20 mg Oral Daily Poggi, Marshall Cork, MD      . sodium phosphate (FLEET) 7-19 GM/118ML enema 1 enema  1 enema Rectal Once PRN Poggi, Marshall Cork, MD      . Derrill Memo ON 06/28/2018] timolol (TIMOPTIC) 0.5 % ophthalmic solution 1 drop  1 drop Both Eyes Daily Poggi, Marshall Cork, MD      . traMADol Veatrice Bourbon) tablet 50 mg  50 mg Oral Q6H PRN Poggi, Marshall Cork, MD          Discharge Medications: Please see discharge summary for a list of discharge medications.  Relevant Imaging Results:  Relevant Lab Results:   Additional Information (SSN: [id number])  Aqua Denslow, Veronia Beets, LCSW

## 2018-06-27 NOTE — Anesthesia Post-op Follow-up Note (Signed)
Anesthesia QCDR form completed.        

## 2018-06-27 NOTE — Progress Notes (Signed)
PT Cancellation Note  Patient Details Name: Tina Howard MRN: [id number] DOB: 01/07/1933   Cancelled Treatment:    Reason Eval/Treat Not Completed: Other (comment).  PT consult received.  Chart reviewed.  Upon arrival to pt's room, pt laying in bed with blue emesis bag and reporting feeling nauseas.  Pt reporting she has been up to go to the bathroom with nursing assist but nausea gets really bad when she gets OOB and moves.  Discussed PT consult with pt and pt preferring to wait until tomorrow for PT session d/t nausea (nursing notified and reporting pt with recent nausea medications; elevated BP noted and discussed with nurse and MD Poggi).  Leitha Bleak, PT 06/27/18, 4:10 PM [phone]

## 2018-06-27 NOTE — Anesthesia Preprocedure Evaluation (Signed)
Anesthesia Evaluation  Patient identified by MRN, date of birth, ID band Patient awake    Reviewed: Allergy & Precautions, H&P , NPO status , Patient's Chart, lab work & pertinent test results, reviewed documented beta blocker date and time   History of Anesthesia Complications (+) PONV and history of anesthetic complications  Airway Mallampati: I  TM Distance: >3 FB Neck ROM: full    Dental  (+) Dental Advidsory Given, Teeth Intact, Caps, Implants   Pulmonary neg pulmonary ROS,           Cardiovascular Exercise Tolerance: Good hypertension, (-) angina+CHF  (-) CAD, (-) Past MI, (-) Cardiac Stents and (-) CABG + dysrhythmias Atrial Fibrillation + Valvular Problems/Murmurs MR      Neuro/Psych negative neurological ROS  negative psych ROS   GI/Hepatic Neg liver ROS, GERD  ,  Endo/Other  negative endocrine ROS  Renal/GU negative Renal ROS  negative genitourinary   Musculoskeletal   Abdominal   Peds  Hematology negative hematology ROS (+)   Anesthesia Other Findings Past Medical History: No date: Allergic rhinitis No date: Arthritis 04/2016: CHF (congestive heart failure) (HCC)     Comment:  developed 2 weeks after identifying AF No date: Complication of anesthesia 04/2016: Dysrhythmia     Comment:  a fib   No date: GERD (gastroesophageal reflux disease)     Comment:  occasionally has reflux/heartburn No date: History of pelvic mass     Comment:  s/p resection, benign No date: Hypercholesteremia No date: Hypertension No date: Mitral regurgitation     Comment:  noted echo 04/2016  No date: Osteoporosis     Comment:  per pt report on Medical Screening Form  No date: PONV (postoperative nausea and vomiting) No date: Pyelonephritis     Comment:  remote hx  No date: Shortness of breath dyspnea 2007: Varicella zoster   Reproductive/Obstetrics negative OB ROS                              Anesthesia Physical Anesthesia Plan  ASA: III  Anesthesia Plan: General   Post-op Pain Management:    Induction: Intravenous  PONV Risk Score and Plan: 3 and TIVA, Ondansetron and Dexamethasone  Airway Management Planned: Oral ETT  Additional Equipment:   Intra-op Plan:   Post-operative Plan:   Informed Consent: I have reviewed the patients History and Physical, chart, labs and discussed the procedure including the risks, benefits and alternatives for the proposed anesthesia with the patient or authorized representative who has indicated his/her understanding and acceptance.   Dental Advisory Given  Plan Discussed with: Anesthesiologist, CRNA and Surgeon  Anesthesia Plan Comments:         Anesthesia Quick Evaluation

## 2018-06-27 NOTE — Anesthesia Postprocedure Evaluation (Signed)
Anesthesia Post Note  Patient: EVONDA ENGE  Procedure(s) Performed: REVERSE SHOULDER ARTHROPLASTY (Right Shoulder)  Patient location during evaluation: PACU Anesthesia Type: General Level of consciousness: awake and alert Pain management: pain level controlled Vital Signs Assessment: post-procedure vital signs reviewed and stable Respiratory status: spontaneous breathing, nonlabored ventilation, respiratory function stable and patient connected to nasal cannula oxygen Cardiovascular status: blood pressure returned to baseline and stable Postop Assessment: no apparent nausea or vomiting Anesthetic complications: no     Last Vitals:  Vitals:   06/27/18 1154 06/27/18 1242  BP: (!) 179/87 (!) 204/81  Pulse: (!) 57 (!) 58  Resp: 18   Temp: 36.4 C 36.7 C  SpO2: 96% 98%    Last Pain:  Vitals:   06/27/18 1154  TempSrc: Oral  PainSc:                  Martha Clan

## 2018-06-27 NOTE — Anesthesia Procedure Notes (Signed)
Anesthesia Regional Block: Interscalene brachial plexus block   Pre-Anesthetic Checklist: ,, timeout performed, Correct Patient, Correct Site, Correct Laterality, Correct Procedure, Correct Position, site marked, Risks and benefits discussed,  Surgical consent,  Pre-op evaluation,  At surgeon's request and post-op pain management  Laterality: Right and Upper  Prep: chloraprep       Needles:  Injection technique: Single-shot  Needle Type: Stimiplex     Needle Length: 5cm  Needle Gauge: 22     Additional Needles:   Procedures:,,,, ultrasound used (permanent image in chart),,,,  Narrative:  Start time: 06/27/2018 8:01 AM End time: 06/27/2018 8:09 AM Injection made incrementally with aspirations every 5 mL.  Performed by: Personally  Anesthesiologist: Martha Clan, MD  Additional Notes: Functioning IV was confirmed and monitors were applied.  A 60mm 22ga Stimuplex needle was used. Sterile prep and drape,hand hygiene and sterile gloves were used.  Negative aspiration and negative test dose prior to incremental administration of local anesthetic. The patient tolerated the procedure well.

## 2018-06-27 NOTE — Transfer of Care (Signed)
Immediate Anesthesia Transfer of Care Note  Patient: Tina Howard  Procedure(s) Performed: REVERSE SHOULDER ARTHROPLASTY (Right Shoulder)  Patient Location: PACU  Anesthesia Type:GA combined with regional for post-op pain  Level of Consciousness: awake  Airway & Oxygen Therapy: Patient Spontanous Breathing and Patient connected to face mask oxygen  Post-op Assessment: Report given to RN and Post -op Vital signs reviewed and stable  Post vital signs: Reviewed  Last Vitals:  Vitals Value Taken Time  BP 158/87 06/27/2018 10:47 AM  Temp 36.2 C 06/27/2018 10:45 AM  Pulse 65 06/27/2018 10:47 AM  Resp 13 06/27/2018 10:47 AM  SpO2 100 % 06/27/2018 10:47 AM  Vitals shown include unvalidated device data.  Last Pain:  Vitals:   06/27/18 0812  TempSrc:   PainSc: 0-No pain      Patients Stated Pain Goal: 1 (41/71/27 8718)  Complications: No apparent anesthesia complications

## 2018-06-27 NOTE — Op Note (Signed)
06/27/2018  3:14 PM  Patient:   Tina Howard  Pre-Op Diagnosis:   Massive irreparable rotator cuff tear with cuff arthropathy, right shoulder.  Post-Op Diagnosis:   Same.  Procedure:   Reverse right total shoulder arthroplasty.  Surgeon:   Pascal Lux, MD  Assistant:   Cameron Proud, PA-C  Anesthesia:   General endotracheal with an interscalene block placed preoperatively by the anesthesiologist.  Findings:   As above.  Complications:   None  EBL:   250 cc  Fluids:   800 cc crystalloid  UOP:   None  TT:   None  Drains:   None  Closure:   Staples  Implants:   All press-fit Integra system with an 11 mm stem, a standard metaphyseal body, a +3 mm humeral platform, a mini baseplate, and a 38 mm concentric +2 mm laterally offset glenosphere.  Brief Clinical Note:   The patient is an 84 year old female with a long history of progressively worsening right shoulder pain. Her symptoms have progressed despite medications, activity modification, etc. Her history and examination are consistent with advanced degenerative joint disease with a large rotator cuff tear, consistent with massive cuff arthropathy. The patient presents at this time for a reverse right total shoulder arthroplasty.  Procedure:   The patient underwent placement of an interscalene block by the anesthesiologist in the preoperative holding area before being brought into the operating room and lain in the supine position. The patient then underwent general endotracheal intubation and anesthesia before the patient was repositioned in the beach chair position using the beach chair positioner. The right shoulder and upper extremity were prepped with ChloraPrep solution before being draped sterilely. Preoperative antibiotics were administered. A standard anterior approach to the shoulder was made through an approximately 4-5 inch incision. The incision was carried down through the subcutaneous tissues to expose the  deltopectoral fascia. The interval between the deltoid and pectoralis muscles was identified and this plane developed, retracting the cephalic vein laterally with the deltoid muscle. The conjoined tendon was identified. Its lateral margin was dissected and the Kolbel self-retraining retractor inserted. The "three sisters" were identified and cauterized. Bursal tissues were removed to improve visualization. The subscapularis tendon was released from its attachment to the lesser tuberosity 1 cm proximal to its insertion and several tagging sutures placed. The inferior capsule was released with care after identifying and protecting the axillary nerve. The proximal humeral cut was made at approximately 20 of retroversion using the extra-medullary guide.   Attention was redirected to the glenoid. The labrum was debrided circumferentially before the center of the glenoid was identified. The guidewire was drilled into the glenoid neck using the appropriate guide. After verifying its position, it was overreamed with the mini-baseplate reamer to create a flat surface before the stem reamer was utilized. The superior and inferior peg sites were reamed using the appropriate guide to complete the glenoid preparation. The permanent mini-baseplate was impacted into place. It was stabilized with a 15 x 4.5 mm central screw and four peripheral screws. Locking caps were placed over the superior and inferior screws. The permanent 38 mm concentric glenosphere with 2 mm of lateral offset was then impacted into place and its Morse taper locking mechanism verified using manual distraction.  Attention was directed to the humeral side. The humeral canal was prepared utilizing the tapered stem reamers sequentially beginning with the 7 mm stem and progressing to an 11 mm stem. This demonstrated a good tight fit. The metaphyseal region was  then prepared using the appropriate planar device. The trial body and small stem was put together  on the back table and a trial reduction performed using the +0 mm and +6 mm inserts.  This combination with the 6 mm insert still demonstrated mild laxity, so the 11 mm stem was re-trialed with the standard metaphyseal body. With the +0 mm insert, the arm demonstrated excellent range of motion as the hand could be brought across the chest to the opposite shoulder and brought to the top of the patient's head and to the patient's ear. The shoulder appeared stable throughout this range of motion. The joint was dislocated and the trial components removed. The permanent 11 mm stem with the standard body was impacted into place with care taken to maintain the appropriate version. After inserting the screw to connect the body with the stem, a repeat trial reduction with the +0 and +3 mm inserts. The +3 mm insert demonstrated excellent stability with the findings as described above. Therefore, the shoulder was re-dislocated and the permanent +3 mm insert impacted into place. After verifying its locking mechanism, the shoulder was relocated using two finger pressure and again placed through a range of motion with the findings as described above.  The wound was copiously irrigated with bacitracin saline solution using the jet lavage system before a total of 20 cc of Exparel diluted out to 60 cc with normal saline and 30 cc of 0.5% Sensorcaine with epinephrine was injected into the pericapsular and peri-incisional tissues to help with postoperative analgesia. The subscapularis tendon was reapproximated using #2 FiberWire interrupted sutures. The deltopectoral interval was closed using #0 Vicryl interrupted sutures before the subcutaneous tissues were closed using 2-0 Vicryl interrupted sutures. The skin was closed using staples. Prior to closing the skin, 1 g of transexemic acid in 10 cc of normal saline was injected intra-articularly to help with postoperative bleeding. A sterile occlusive dressing was applied to the wound  before the arm was placed into a shoulder immobilizer with an abduction pillow. A Polar Care system also was applied to the shoulder. The patient was then transferred back to a hospital bed before being awakened, extubated, and returned to the recovery room in satisfactory condition after tolerating the procedure well.

## 2018-06-27 NOTE — H&P (Signed)
Paper H&P to be scanned into permanent record. H&P reviewed and patient re-examined. No changes. 

## 2018-06-28 ENCOUNTER — Encounter
Admission: RE | Admit: 2018-06-28 | Discharge: 2018-06-28 | Disposition: A | Discharge status: Home / Self Care | Payer: Medicare Other | Source: Ambulatory Visit | Attending: Internal Medicine | Admitting: Internal Medicine

## 2018-06-28 ENCOUNTER — Encounter: Payer: Self-pay | Admitting: Surgery

## 2018-06-28 LAB — CBC WITH DIFFERENTIAL/PLATELET
Basophils Absolute: 0 10*3/uL (ref 0–0.1)
Basophils Relative: 0 %
Eosinophils Absolute: 0 10*3/uL (ref 0–0.7)
Eosinophils Relative: 0 %
HCT: 35.2 % (ref 35.0–47.0)
Hemoglobin: 11.8 g/dL — ABNORMAL LOW (ref 12.0–16.0)
Lymphocytes Relative: 9 %
Lymphs Abs: 1 10*3/uL (ref 1.0–3.6)
MCH: 27.6 pg (ref 26.0–34.0)
MCHC: 33.6 g/dL (ref 32.0–36.0)
MCV: 82 fL (ref 80.0–100.0)
Monocytes Absolute: 0.7 10*3/uL (ref 0.2–0.9)
Monocytes Relative: 6 %
Neutro Abs: 9.6 10*3/uL — ABNORMAL HIGH (ref 1.4–6.5)
Neutrophils Relative %: 85 %
Platelets: 226 10*3/uL (ref 150–440)
RBC: 4.28 MIL/uL (ref 3.80–5.20)
RDW: 14.3 % (ref 11.5–14.5)
WBC: 11.3 10*3/uL — ABNORMAL HIGH (ref 3.6–11.0)

## 2018-06-28 LAB — POTASSIUM: Potassium: 3.7 mmol/L (ref 3.5–5.1)

## 2018-06-28 LAB — BASIC METABOLIC PANEL WITH GFR
Anion gap: 10 (ref 5–15)
BUN: 14 mg/dL (ref 8–23)
CO2: 27 mmol/L (ref 22–32)
Calcium: 8.5 mg/dL — ABNORMAL LOW (ref 8.9–10.3)
Chloride: 100 mmol/L (ref 98–111)
Creatinine, Ser: 0.38 mg/dL — ABNORMAL LOW (ref 0.44–1.00)
GFR calc Af Amer: >60 mL/min
GFR calc non Af Amer: >60 mL/min
Glucose, Bld: 151 mg/dL — ABNORMAL HIGH (ref 70–99)
Potassium: 2.9 mmol/L — ABNORMAL LOW (ref 3.5–5.1)
Sodium: 137 mmol/L (ref 135–145)

## 2018-06-28 MED ORDER — SCOPOLAMINE 1 MG/3DAYS TD PT72
1.0000 | MEDICATED_PATCH | TRANSDERMAL | Status: DC
  Administered 2018-06-28: 1.5 mg via TRANSDERMAL
  Filled 2018-06-28: qty 1

## 2018-06-28 MED ORDER — PROMETHAZINE HCL 25 MG PO TABS
12.5000 mg | ORAL_TABLET | Freq: Four times a day (QID) | ORAL | Status: DC | PRN
  Filled 2018-06-28: qty 1

## 2018-06-28 MED ORDER — POTASSIUM CHLORIDE 20 MEQ PO PACK
40.0000 meq | PACK | ORAL | Status: AC
  Administered 2018-06-28 (×3): 40 meq via ORAL
  Filled 2018-06-28 (×3): qty 2

## 2018-06-28 NOTE — Clinical Social Work Note (Signed)
Clinical Social Work Assessment  Patient Details  Name: Tina Howard MRN: [id number] Date of Birth: 02/17/1933  Date of referral:  06/28/18               Reason for consult:  Facility Placement                Permission sought to share information with:  Chartered certified accountant granted to share information::  Yes, Verbal Permission Granted  Name::      [place]::   [place]   Relationship::     Contact Information:     Housing/Transportation Living arrangements for the past 2 months:  [place] of Information:  Patient Patient Interpreter Needed:  None Criminal Activity/Legal Involvement Pertinent to Current Situation/Hospitalization:  No - Comment as needed Significant Relationships:  Adult Children Lives with:  Self Do you feel safe going back to the place where you live?  Yes Need for family participation in patient care:  Yes (Comment)  Care giving concerns:  Patient lives alone in [place] and reported that her son Tina Howard stays with her occassionally.    Social Worker assessment / plan:  Holiday representative (CSW) received SNF consult. PT is recommending SNF. CSW met with patient alone at bedside to discuss D/C plan. Patient was alert and oriented X4 and was laying in the bed. CSW introduced self and explained role of CSW department. Patient reported that she lives alone however her son stays with her occasionally. CSW explained SNF process and that medicare requires a 3 night qualifying inpatient stay in a hospital in order to pay for SNF. Patient was admitted to inpatient on 06/27/18. Patient verbalized her understanding and is agreeable to SNF search in [place]. FL2 complete and faxed out.   CSW presented bed offers to patient and she chose [facility]. Plan is for patient to D/C to [facility] Friday 06/30/18 pending medical clearance. [facility] admissions coordinator at [facility] is aware of above. CSW  will continue to follow and assist as needed.   Employment status:  Retired Forensic scientist:  Medicare PT Recommendations:  [place] / Referral to community resources:  [place]  Patient/Family's Response to care:  Patient is agreeable to D/C to [facility].   Patient/Family's Understanding of and Emotional Response to Diagnosis, Current Treatment, and Prognosis:  Patient was very pleasant and thanked CSW for assistance.   Emotional Assessment Appearance:  Appears stated age Attitude/Demeanor/Rapport:    Affect (typically observed):  Accepting, Adaptable, Pleasant Orientation:  Oriented to Self, Oriented to Place, Oriented to  Time, Oriented to Situation Alcohol / Substance use:  Not Applicable Psych involvement (Current and /or in the community):  No (Comment)  Discharge Needs  Concerns to be addressed:  Discharge Planning Concerns Readmission within the last 30 days:  No Current discharge risk:  Dependent with Mobility Barriers to Discharge:  Continued Medical Work up   [employer], Veronia Beets, LCSW 06/28/2018, 1:44 PM

## 2018-06-28 NOTE — Evaluation (Signed)
Occupational Therapy Evaluation Patient Details Name: Tina Howard MRN: [id number] DOB: 12/08/1933 Today's Date: 06/28/2018    History of Present Illness Pt is an 84yo female POD#1 s/p R reverse TSA with a PMHx significant for CHF, A-fib, arthritis, GERD, HTN, mitral regurgitation, osteoporosis, hypercholesteremia, and hx PONV.   Clinical Impression   Patient was seen for an OT evaluation this date. Pt lives in a 1 story home with her son who works full time during the day. Pt reports she might have a neighbor who she could call to help a little if needed, but no one who could provided consistent/reliable support. Prior to surgery, pt was fairly active and independent, however, reports increasing difficulty with ADL tasks 2/2 RUE pain. Pt has orders for RUE to be immobilized and will be NWBing per MD. Patient presents with impaired strength/ROM, pain, and sensation to RUE with block not completely resolved yet. Pt also with significant nausea, limiting her ability to perform any mobility (only able to raise HOB approx 10 degrees and pt unable to tolerate more) or doff/don sling or polar care for training. These impairments result in a decreased ability to perform self care tasks requiring mod-max assist for UB/LB dressing and bathing and max assist for application of polar care, compression stockings, and sling/immobilizer. Pt instructed in polar care mgt, compression stockings mgt, sling/immobilizer mgt, elbow/wrist/hand AROM exercises for RUE, RUE precautions, adaptive strategies for bathing/dressing/toileting/grooming, positioning and considerations for sleep, and home/routines modifications to maximize falls prevention, safety, and independence. Handout provided. Pt resistant to OT removing sling/polar cre to ensure proper placement and skin integrity 2/2 nausea. Pt verbalized understanding of all education/training provided and will benefit from additional skilled OT services to address noted  impairments and functional deficits, provide additional instruction in supine PROM shoulder exercises, and training in AE, polar care, and sling mgt. Pt will benefit from skilled OT services to address these limitations and improve independence in daily tasks. Recommend STR services to continue therapy to maximize return to PLOF. Will continue to assess for appropriateness of HHOT services next session.     Follow Up Recommendations  SNF    Equipment Recommendations  3 in 1 bedside commode    Recommendations for Other Services       Precautions / Restrictions Precautions Precautions: Shoulder;Fall Shoulder Interventions: Shoulder sling/immobilizer;Shoulder abduction pillow;At all times;Off for dressing/bathing/exercises Precaution Booklet Issued: Yes (comment) Restrictions Weight Bearing Restrictions: Yes RUE Weight Bearing: Non weight bearing      Mobility Bed Mobility               General bed mobility comments: pt unable to tolerate bed mobility 2:2 nausea and despite encouragement. OT raised HOB approx 10 degrees and pt was unable to tolerate more despite extended break to adjust to increase upright positioning  Transfers                 General transfer comment: unable to assess    Balance                                           ADL either performed or assessed with clinical judgement   ADL Overall ADL's : Needs assistance/impaired Eating/Feeding: Modified independent;Bed level   Grooming: Modified independent;Bed level   Upper Body Bathing: Minimal assistance;Moderate assistance;Bed level Upper Body Bathing Details (indicate cue type and reason): pt instructed in grooming techniques for  underarm bathing with verbal instruction and visual demo, unable to tolerate sitting EOB to trial Lower Body Bathing: Moderate assistance;Bed level   Upper Body Dressing : Maximal assistance;Bed level Upper Body Dressing Details (indicate cue type  and reason): pt instructed in hemi techniques for UB dressing with verbal instruction and visual demo, unable to tolerate sitting EOB to trial Lower Body Dressing: Bed level;Moderate assistance Lower Body Dressing Details (indicate cue type and reason): pt instructed in AE for LB dressing with verbal instruction and visual demo, unable to tolerate sitting EOB to trial                     Vision Baseline Vision/History: Wears glasses Wears Glasses: At all times Patient Visual Report: No change from baseline       Perception     Praxis      Pertinent Vitals/Pain Pain Assessment: 0-10 Pain Score: 1  Pain Location: R shoulder; block still working Pain Descriptors / Indicators: Tingling;Numbness;Discomfort Pain Intervention(s): Limited activity within patient's tolerance;Monitored during session;Premedicated before session;Ice applied     Hand Dominance Right   Extremity/Trunk Assessment Upper Extremity Assessment Upper Extremity Assessment: RUE deficits/detail(LUE WFL) RUE Deficits / Details: limited assessment 2:2 pt unable to tolerate movement in bed or movement of sling/polar care 2:2 nausea. Fair grip strength, reports R thumb is still numb/tingly RUE: Unable to fully assess due to pain;Unable to fully assess due to immobilization RUE Sensation: decreased light touch RUE Coordination: decreased fine motor;decreased gross motor   Lower Extremity Assessment Lower Extremity Assessment: Defer to PT evaluation   Cervical / Trunk Assessment Cervical / Trunk Assessment: Normal   Communication Communication Communication: No difficulties   Cognition Arousal/Alertness: Awake/alert Behavior During Therapy: WFL for tasks assessed/performed Overall Cognitive Status: Within Functional Limits for tasks assessed                                     General Comments       Exercises Other Exercises Other Exercises: Pt instructed in compression stocking mgt and  polar care mgt with wear schedule and techniques for donning. Unable to trial 2:2 nausea. Other Exercises: Pt instructed in falls prevention strategies Other Exercises: Pt instructed in elbow, wrist, and hand ROM exercises. Will benefit from additional training.   Shoulder Instructions      Home Living Family/patient expects to be discharged to:: Private residence Living Arrangements: Children(son) Available Help at Discharge: Family;Neighbor;Available PRN/intermittently(son works during the day) Type of Home: House(townhouse) Home Access: Stairs to enter [employer] of Steps: 1   Home Layout: One level     Bathroom Shower/Tub: Occupational psychologist: Handicapped height     Home Equipment: Shower seat - built in;Cane - single point;Adaptive equipment Adaptive Equipment: Reacher Additional Comments: verbalizes plan to have [place] shower head installed soon      Prior Functioning/Environment Level of Independence: Independent        Comments: Pt indep with mobility, ADL, and driving, with no falls in past 12 months. However, pt increasingly limited by RUE pain and dysfunction        OT Problem List: Decreased strength;Decreased range of motion;Decreased knowledge of precautions;Decreased knowledge of use of DME or AE;Impaired UE functional use;Impaired sensation;Pain      OT Treatment/Interventions: Self-care/ADL training;Therapeutic exercise;Therapeutic activities;DME and/or AE instruction;Patient/family education    OT Goals(Current goals can be found in the care plan  section) Acute Rehab OT Goals Patient Stated Goal: have less nausea and recover to PLOF with less pain in R UE OT Goal Formulation: With patient Time For Goal Achievement: 07/12/18 Potential to Achieve Goals: Good ADL Goals Pt Will Perform Upper Body Bathing: sitting;with modified independence(under R arm) Pt Will Perform Lower Body Bathing: with modified independence;sit to/from  stand Pt Will Perform Upper Body Dressing: with caregiver independent in assisting;with modified independence;sitting(using learned hemi techniques, w/ R shoulder precautions t/o) Pt Will Perform Lower Body Dressing: with adaptive equipment;with supervision;sit to/from stand Pt Will Transfer to Toilet: with supervision;ambulating(comfort height toilet, LRAD for amb) Pt/caregiver will Perform Home Exercise Program: Right Upper extremity;Increased ROM;With written HEP provided(elbow/hand/wrist AROM, sup PROM shldr flex & ext rotation) Additional ADL Goal #1: Pt will independently instruct family/friend to don/doff sling and polar care and verbalize wear schedule and mgt of each without verbal cues. Additional ADL Goal #2: Pt will independently instruct family/friend to don/doff compression stockings and independently verbalize wear schedule.  OT Frequency: Min 1X/week   Barriers to D/C: Decreased caregiver support          Co-evaluation              AM-PAC PT "6 Clicks" Daily Activity     Outcome Measure Help from another person eating meals?: A Little Help from another person taking care of personal grooming?: A Little Help from another person toileting, which includes using toliet, bedpan, or urinal?: A Lot Help from another person bathing (including washing, rinsing, drying)?: A Lot Help from another person to put on and taking off regular upper body clothing?: A Lot Help from another person to put on and taking off regular lower body clothing?: A Lot 6 Click Score: 14   End of Session    Activity Tolerance: Treatment limited secondary to medical complications (Comment)(limited by nausea) Patient left: in bed;with call bell/phone within reach;with bed alarm set;with SCD's reapplied;Other (comment)(sling and polar care in place, PA in room to assess)  OT Visit Diagnosis: Other abnormalities of gait and mobility (R26.89);Pain Pain - Right/Left: Right Pain - part of body: Shoulder                 Time: 9450-3888 OT Time Calculation (min): 47 min Charges:  OT General Charges $OT Visit: 1 Visit OT Evaluation $OT Eval Moderate Complexity: 1 Mod OT Treatments $Self Care/Home Management : 23-37 mins  Jeni Salles, MPH, MS, OTR/L ascom [phone] 06/28/18, 10:22 AM

## 2018-06-28 NOTE — Evaluation (Signed)
Physical Therapy Evaluation Patient Details Name: Tina Howard MRN: [id number] DOB: [birth year] Today's Date: 06/28/2018   History of Present Illness  Pt is an 84 yo female POD#1 s/p R reverse TSA with a PMHx significant for CHF, A-fib, arthritis, GERD, HTN, mitral regurgitation, osteoporosis, hypercholesteremia, and hx PONV.  Clinical Impression  Prior to hospital admission, pt was independent; no h/o falls in past 12 months.  Pt lives with her son (who works full time) in 1 level town home with 1 step to enter (no railing).  Currently pt is SBA with supine to/from sit; CGA to min assist with transfers; and CGA to min assist ambulating short distance to bathroom and back (pt requiring L UE support on IV pole for balance d/t unsteadiness and pt trying to hold onto objects for balance otherwise).  1/10 R shoulder pain during session.  Limited session activities d/t nausea (nurse aware).  Pt would benefit from skilled PT to address noted impairments and functional limitations (see below for any additional details).  Upon hospital discharge, currently recommend pt discharge to STR.    Follow Up Recommendations SNF    Equipment Recommendations  Cane    Recommendations for Other Services OT consult     Precautions / Restrictions Precautions Precautions: Shoulder;Fall Shoulder Interventions: Shoulder sling/immobilizer;Shoulder abduction pillow;At all times;Off for dressing/bathing/exercises Precaution Booklet Issued: Yes (comment) Restrictions Weight Bearing Restrictions: Yes RUE Weight Bearing: Non weight bearing      Mobility  Bed Mobility Overal bed mobility: Needs Assistance Bed Mobility: Supine to Sit;Sit to Supine     Supine to sit: Supervision;HOB elevated Sit to supine: Supervision;HOB elevated   General bed mobility comments: increased effort and time to perform on own; 2 assist to boost pt up in bed  Transfers Overall transfer level: Needs assistance Equipment used:  None Transfers: Sit to/from [facility] Sit to Stand: Min guard;Min assist Stand pivot transfers: Min guard;Min assist       General transfer comment: assist to steady; pt reaching and holding onto objects with L UE for support  Ambulation/Gait Ambulation/Gait assistance: Min guard;Min assist Gait Distance (Feet): (10 feet x2 (to bathroom and back)) Assistive device: IV Pole   Gait velocity: decreased   General Gait Details: pt holding objects for balance so then pt held onto IV pole for support with L UE; decreased B step length/foot clearance/heelstrike; unsteady; increased B lateral sway  Stairs            Wheelchair Mobility    Modified Rankin (Stroke Patients Only)       Balance Overall balance assessment: Needs assistance Sitting-balance support: No upper extremity supported;Feet supported Sitting balance-Leahy Scale: Good Sitting balance - Comments: steady sitting reaching within BOS with L UE   Standing balance support: No upper extremity supported Standing balance-Leahy Scale: Good Standing balance comment: steady static standing reaching within BOS                             Pertinent Vitals/Pain Pain Assessment: 0-10 Pain Score: 1  Pain Location: R shoulder (pt with interscalene block) Pain Descriptors / Indicators: Tingling;Numbness;Discomfort Pain Intervention(s): Limited activity within patient's tolerance;Monitored during session;Repositioned;Premedicated before session;Other (comment)(polar care applied and activated)    Home Living Family/patient expects to be discharged to:: Private residence Living Arrangements: Children(Pt's son) Available Help at Discharge: Family;Neighbor;Available PRN/intermittently(Son works during the day) Type of Home: House(Townhouse) Home Access: Stairs to enter Entrance Stairs-Rails: None Entrance Stairs-Number of Steps:  1 Home Layout: One level Home Equipment: Shower seat - built  in;Cane - single point;Adaptive equipment Additional Comments: Verbalizes plan to have [place] shower head installed soon    Prior Function Level of Independence: Independent         Comments: Pt indep with mobility, ADL, and driving, with no falls in past 12 months. However, pt increasingly limited by RUE pain and dysfunction     Hand Dominance   Dominant Hand: Right    Extremity/Trunk Assessment   Upper Extremity Assessment Upper Extremity Assessment: Defer to OT evaluation (below information for UE assessment per OT eval today) RUE Deficits / Details: limited assessment 2:2 pt unable to tolerate movement in bed or movement of sling/polar care 2:2 nausea. Fair grip strength, reports R thumb is still numb/tingly RUE: Unable to fully assess due to pain;Unable to fully assess due to immobilization RUE Sensation: decreased light touch RUE Coordination: decreased fine motor;decreased gross motor    Lower Extremity Assessment Lower Extremity Assessment: Generalized weakness    Cervical / Trunk Assessment Cervical / Trunk Assessment: Normal  Communication   Communication: No difficulties  Cognition Arousal/Alertness: Awake/alert Behavior During Therapy: WFL for tasks assessed/performed Overall Cognitive Status: Within Functional Limits for tasks assessed                                        General Comments General comments (skin integrity, edema, etc.): R UE sling and polar care in place upon PT entry.  Nursing cleared pt for participation in physical therapy.  Pt agreeable to limited PT session.    Exercises    Assessment/Plan    PT Assessment Patient needs continued PT services  PT Problem List Decreased strength;Decreased range of motion;Decreased activity tolerance;Decreased balance;Decreased mobility;Decreased knowledge of use of DME;Decreased knowledge of precautions;Pain       PT Treatment Interventions DME instruction;Gait training;Stair  training;Functional mobility training;Therapeutic activities;Therapeutic exercise;Balance training;Patient/family education    PT Goals (Current goals can be found in the Care Plan section)  Acute Rehab PT Goals Patient Stated Goal: to have less nausea and be able to walk PT Goal Formulation: With patient Time For Goal Achievement: 07/12/18 Potential to Achieve Goals: Good    Frequency BID   Barriers to discharge Decreased caregiver support      Co-evaluation               AM-PAC PT "6 Clicks" Daily Activity  Outcome Measure Difficulty turning over in bed (including adjusting bedclothes, sheets and blankets)?: A Lot Difficulty moving from lying on back to sitting on the side of the bed? : A Lot Difficulty sitting down on and standing up from a chair with arms (e.g., wheelchair, bedside commode, etc,.)?: Unable Help needed moving to and from a bed to chair (including a wheelchair)?: A Little Help needed walking in hospital room?: A Little Help needed climbing 3-5 steps with a railing? : A Lot 6 Click Score: 13    End of Session Equipment Utilized During Treatment: Gait belt;Other (comment)(R UE sling) Activity Tolerance: Other (comment)(Limited d/t nausea) Patient left: in bed;with call bell/phone within reach;with bed alarm set;with SCD's reapplied;Other (comment)(B heels elevated via pillow; polar care in place and activated) Nurse Communication: Mobility status;Precautions;Weight bearing status;Other (comment)(Pt's nausea) PT Visit Diagnosis: Unsteadiness on feet (R26.81);Other abnormalities of gait and mobility (R26.89);Muscle weakness (generalized) (M62.81);Pain Pain - Right/Left: Right Pain - part of body: Shoulder  Time: 2527-1292 PT Time Calculation (min) (ACUTE ONLY): 28 min   Charges:   PT Evaluation $PT Eval Low Complexity: 1 Low PT Treatments $Therapeutic Activity: 8-22 mins   PT G CodesLeitha Bleak, PT 06/28/18, 10:50  AM [phone]

## 2018-06-28 NOTE — Progress Notes (Signed)
Physical Therapy Treatment Patient Details Name: Tina Howard MRN: [id number] DOB: 14-Jun-1933 Today's Date: 06/28/2018    History of Present Illness Pt is an 84 yo female POD#1 s/p R reverse TSA with a PMHx significant for CHF, A-fib, arthritis, GERD, HTN, mitral regurgitation, osteoporosis, hypercholesteremia, and hx PONV.    PT Comments    Pt able to progress to ambulating 120 feet with SPC CGA to min assist to steady (pt intermittently unsteady and requiring cueing for gait technique using SPC).  No R shoulder pain during session.  Activities limited d/t nausea.  Will continue to progress pt with strengthening, balance, and progressive functional mobility per pt tolerance.    Follow Up Recommendations  SNF     Equipment Recommendations  Cane    Recommendations for Other Services OT consult     Precautions / Restrictions Precautions Precautions: Shoulder;Fall Shoulder Interventions: Shoulder sling/immobilizer;Shoulder abduction pillow;At all times;Off for dressing/bathing/exercises Precaution Booklet Issued: Yes (comment) Restrictions Weight Bearing Restrictions: Yes RUE Weight Bearing: Non weight bearing    Mobility  Bed Mobility Overal bed mobility: Needs Assistance Bed Mobility: Supine to Sit;Sit to Supine     Supine to sit: Min assist;HOB elevated Sit to supine: Supervision;HOB elevated   General bed mobility comments: assist for trunk supine to sit; SBA for safety sit to supine  Transfers Overall transfer level: Needs assistance Equipment used: Straight cane Transfers: Sit to/from Stand Sit to Stand: Min guard;Min assist         General transfer comment: assist to steady  Ambulation/Gait Ambulation/Gait assistance: Min guard;Min assist Gait Distance (Feet): 120 Feet Assistive device: Straight cane   Gait velocity: decreased   General Gait Details: pt intermittently unsteady requiring min assist to steady (pt requiring cueing for gait technique  with SPC to improve balance); increased B lateral sway   Stairs             Wheelchair Mobility    Modified Rankin (Stroke Patients Only)       Balance Overall balance assessment: Needs assistance Sitting-balance support: No upper extremity supported;Feet supported Sitting balance-Leahy Scale: Good Sitting balance - Comments: steady sitting reaching within BOS with L UE   Standing balance support: No upper extremity supported Standing balance-Leahy Scale: Good Standing balance comment: steady static standing reaching within BOS                            Cognition Arousal/Alertness: Awake/alert Behavior During Therapy: WFL for tasks assessed/performed Overall Cognitive Status: Within Functional Limits for tasks assessed                                        Exercises      General Comments General comments (skin integrity, edema, etc.): R UE sling and polar care in place upon PT entry.  Pt agreeable to PT session.      Pertinent Vitals/Pain Pain Assessment: No/denies pain Pain Location: R shoulder (pt with interscalene block) Pain Intervention(s): Limited activity within patient's tolerance;Monitored during session;Repositioned;Other (comment)(polar care applied and activated)    Home Living                      Prior Function            PT Goals (current goals can now be found in the care plan section) Acute Rehab PT Goals Patient  Stated Goal: to have less nausea and be able to walk PT Goal Formulation: With patient Time For Goal Achievement: 07/12/18 Potential to Achieve Goals: Good Progress towards PT goals: Progressing toward goals    Frequency    BID      PT Plan Current plan remains appropriate    Co-evaluation              AM-PAC PT "6 Clicks" Daily Activity  Outcome Measure  Difficulty turning over in bed (including adjusting bedclothes, sheets and blankets)?: A Lot Difficulty moving from  lying on back to sitting on the side of the bed? : Unable Difficulty sitting down on and standing up from a chair with arms (e.g., wheelchair, bedside commode, etc,.)?: Unable Help needed moving to and from a bed to chair (including a wheelchair)?: A Little Help needed walking in hospital room?: A Little Help needed climbing 3-5 steps with a railing? : A Little 6 Click Score: 13    End of Session Equipment Utilized During Treatment: Gait belt;Other (comment)(R UE sling) Activity Tolerance: Other (comment)(Limited d/t nausea) Patient left: in bed;with call bell/phone within reach;with bed alarm set;with family/visitor present;with SCD's reapplied;Other (comment)(B heels elevated via pillow) Nurse Communication: Mobility status;Precautions;Weight bearing status;Other (comment)(Pt's nausea) PT Visit Diagnosis: Unsteadiness on feet (R26.81);Other abnormalities of gait and mobility (R26.89);Muscle weakness (generalized) (M62.81);Pain Pain - Right/Left: Right Pain - part of body: Shoulder     Time: 4695-0722 PT Time Calculation (min) (ACUTE ONLY): 31 min  Charges:  $Gait Training: 8-22 mins $Therapeutic Activity: 8-22 mins                    G CodesLeitha Bleak, PT 06/28/18, 3:16 PM [phone]

## 2018-06-28 NOTE — Progress Notes (Signed)
Subjective: 1 Day Post-Op Procedure(s) (LRB): REVERSE SHOULDER ARTHROPLASTY (Right) Patient reports pain as mild.   Patient is well, but has had some minor complaints of nausea/vomiting Plan is to go Skilled nursing facility after hospital stay. Negative for chest pain and shortness of breath Fever: no Gastrointestinal:Positive for nausea and vomiting  Objective: Vital signs in last 24 hours: Temp:  [97.1 F (36.2 C)-98 F (36.7 C)] 97.7 F (36.5 C) (07/03 0823) Pulse Rate:  [56-81] 71 (07/03 0823) Resp:  [10-23] 19 (07/03 0431) BP: (148-206)/(73-97) 154/73 (07/03 0823) SpO2:  [93 %-100 %] 96 % (07/03 0823)  Intake/Output from previous day:  Intake/Output Summary (Last 24 hours) at 06/28/2018 0948 Last data filed at 06/28/2018 0600 Gross per 24 hour  Intake 2586.25 ml  Output 250 ml  Net 2336.25 ml    Intake/Output this shift: No intake/output data recorded.  Labs: Recent Labs    06/28/18 0321  HGB 11.8*   Recent Labs    06/28/18 0321  WBC 11.3*  RBC 4.28  HCT 35.2  PLT 226   Recent Labs    06/28/18 0321  NA 137  K 2.9*  CL 100  CO2 27  BUN 14  CREATININE 0.38*  GLUCOSE 151*  CALCIUM 8.5*   No results for input(s): LABPT, INR in the last 72 hours.   EXAM General - Patient is Alert, Appropriate and Oriented Extremity - ABD soft Intact pulses distally Incision: dressing C/D/I No cellulitis present  Pt still having effects of nerve block, decreased sensation to light touch to the right arm. Dressing/Incision - clean, dry, no drainage Motor Function - intact, moving foot and toes well on exam.  Abdomen soft with normal BS.  Past Medical History:  Diagnosis Date  . Allergic rhinitis   . Arthritis   . CHF (congestive heart failure) ([place]) 04/2016   developed 2 weeks after identifying AF  . Complication of anesthesia   . Dysrhythmia 04/2016   a fib    . GERD (gastroesophageal reflux disease)    occasionally has reflux/heartburn  . History of  pelvic mass    s/p resection, benign  . Hypercholesteremia   . Hypertension   . Mitral regurgitation    noted echo 04/2016   . Osteoporosis    per pt report on Medical Screening Form   . PONV (postoperative nausea and vomiting)   . Pyelonephritis    remote hx   . Shortness of breath dyspnea   . Varicella zoster 2007    Assessment/Plan: 1 Day Post-Op Procedure(s) (LRB): REVERSE SHOULDER ARTHROPLASTY (Right) Active Problems:   Status post reverse total shoulder replacement, right  Estimated body mass index is 36.84 kg/m as calculated from the following:   Height as of this encounter: 5\' 1"  (1.549 m).   Weight as of this encounter: 88.5 kg (195 lb). Advance diet Up with therapy D/C IV fluids when tolerating po intake.  Labs reviewed this AM, K+ 2.9, currently receiving potassium supplements. Pt is nauseated this morning, continue Zofran.  If no improvement will add phenergan. Pt has worked with OT but not PT, plan will be for discharge to rehab when medically appropriate. Continue to monitor N/V today.  Up with therapy later, being working on a BM.  DVT Prophylaxis - Xarelto Non-weightbearing to the right upper extremity.  Raquel James, PA-C [facility] 06/28/2018, 9:48 AM

## 2018-06-28 NOTE — Clinical Social Work Placement (Signed)
   CLINICAL SOCIAL WORK PLACEMENT  NOTE  Date:  06/28/2018  Patient Details  Name: SHEMEKIA PATANE MRN: [id number] Date of Birth: [birth year]  Clinical Social Work is seeking post-discharge placement for this patient at the [place] level of care (*CSW will initial, date and re-position this form in  chart as items are completed):  Yes   Patient/family provided with [facility] Work Department's list of facilities offering this level of care within the geographic area requested by the patient (or if unable, by the patient's family).  Yes   Patient/family informed of their freedom to choose among providers that offer the needed level of care, that participate in Medicare, Medicaid or managed care program needed by the patient, have an available bed and are willing to accept the patient.  Yes   Patient/family informed of [facility]'s ownership interest in [facility] and [facility], as well as of the fact that they are under no obligation to receive care at these facilities.  PASRR submitted to EDS on 06/27/18     PASRR number received on 06/27/18     Existing PASRR number confirmed on       FL2 transmitted to all facilities in geographic area requested by pt/family on 06/27/18     FL2 transmitted to all facilities within larger geographic area on       Patient informed that his/her managed care company has contracts with or will negotiate with certain facilities, including the following:        Yes   Patient/family informed of bed offers received.  Patient chooses bed at [facility] )     Physician recommends and patient chooses bed at      Patient to be transferred to   on  .  Patient to be transferred to facility by       Patient family notified on   of transfer.  Name of family member notified:        PHYSICIAN       Additional Comment:    _______________________________________________ Surah Pelley, Veronia Beets, LCSW 06/28/2018,  1:43 PM

## 2018-06-29 LAB — CBC
HEMATOCRIT: 34.4 % — AB (ref 35.0–47.0)
HEMOGLOBIN: 11.8 g/dL — AB (ref 12.0–16.0)
MCH: 28.5 pg (ref 26.0–34.0)
MCHC: 34.4 g/dL (ref 32.0–36.0)
MCV: 82.9 fL (ref 80.0–100.0)
Platelets: 233 10*3/uL (ref 150–440)
RBC: 4.15 MIL/uL (ref 3.80–5.20)
RDW: 14.2 % (ref 11.5–14.5)
WBC: 11.5 10*3/uL — ABNORMAL HIGH (ref 3.6–11.0)

## 2018-06-29 LAB — BASIC METABOLIC PANEL
Anion gap: 5 (ref 5–15)
BUN: 16 mg/dL (ref 8–23)
CHLORIDE: 104 mmol/L (ref 98–111)
CO2: 30 mmol/L (ref 22–32)
Calcium: 8.4 mg/dL — ABNORMAL LOW (ref 8.9–10.3)
Creatinine, Ser: 0.44 mg/dL (ref 0.44–1.00)
GFR calc Af Amer: >60 mL/min (ref >60)
GLUCOSE: 147 mg/dL — AB (ref 70–99)
Potassium: 3.8 mmol/L (ref 3.5–5.1)
SODIUM: 139 mmol/L (ref 135–145)

## 2018-06-29 MED ORDER — TRAMADOL HCL 50 MG PO TABS: 50.0000 mg | ORAL_TABLET | Freq: Four times a day (QID) | ORAL | 0 refills | Status: DC | PRN

## 2018-06-29 MED ORDER — OXYCODONE HCL 5 MG PO TABS: 5.0000 mg | ORAL_TABLET | ORAL | 0 refills | Status: DC | PRN

## 2018-06-29 MED ORDER — SCOPOLAMINE 1 MG/3DAYS TD PT72: 1.0000 | MEDICATED_PATCH | TRANSDERMAL | 0 refills | Status: DC

## 2018-06-29 MED ORDER — RIVAROXABAN 20 MG PO TABS
20.0000 mg | ORAL_TABLET | Freq: Every day | ORAL | Status: DC
  Filled 2018-06-29: qty 1

## 2018-06-29 MED ORDER — ONDANSETRON HCL 4 MG PO TABS: 4.0000 mg | ORAL_TABLET | Freq: Four times a day (QID) | ORAL | 0 refills | Status: DC | PRN

## 2018-06-29 NOTE — Discharge Instructions (Signed)
Interscalene Nerve Block with Exparel  1.  For your surgery you have received an Interscalene Nerve Block with Exparel. 2. Nerve Blocks affect many types of nerves, including nerves that control movement, pain and normal sensation.  You may experience feelings such as numbness, tingling, heaviness, weakness or the inability to move your arm or the feeling or sensation that your arm has "fallen asleep". 3. A nerve block with Exparel can last up to 5 days.  Usually the weakness wears off first.  The tingling and heaviness usually wear off next.  Finally you may start to notice pain.  Keep in mind that this may occur in any order.  Once a nerve block starts to wear off it is usually completely gone within 60 minutes. 4. ISNB may cause mild shortness of breath, a hoarse voice, blurry vision, unequal pupils, or drooping of the face on the same side as the nerve block.  These symptoms will usually resolve with the numbness.  Very rarely the procedure itself can cause mild seizures. 5. If needed, your surgeon will give you a prescription for pain medication.  It will take about 60 minutes for the oral pain medication to become fully effective.  So, it is recommended that you start taking this medication before the nerve block first begins to wear off, or when you first begin to feel discomfort. 6. Take your pain medication only as prescribed.  Pain medication can cause sedation and decrease your breathing if you take more than you need for the level of pain that you have. 7. Nausea is a common side effect of many pain medications.  You may want to eat something before taking your pain medicine to prevent nausea. 8. After an Interscalene nerve block, you cannot feel pain, pressure or extremes in temperature in the effected arm.  Because your arm is numb it is at an increased risk for injury.  To decrease the possibility of injury, please practice the following:  a. While you are awake change the position of  your arm frequently to prevent too much pressure on any one area for prolonged periods of time. b.  If you have a cast or tight dressing, check the color or your fingers every couple of hours.  Call your surgeon with the appearance of any discoloration (white or blue). c. If you are given a sling to wear before you go home, please wear it  at all times until the block has completely worn off.  Do not get up at night without your sling. d. Please contact [facility] or your surgeon if you do not begin to regain sensation after 7 days from the surgery.  Anesthesia may be contacted by calling the Same Day Surgery Department, Mon. through Fri., 6 am to 4 pm at [phone].   e. If you experience any other problems or concerns, please contact your surgeon's office. f. If you experience severe or prolonged shortness of breath go to the nearest emergency department.   Diet: As you were doing prior to hospitalization   Shower:  May shower but keep the wounds dry, use an occlusive plastic wrap, NO SOAKING IN TUB.  If the bandage gets wet, change with a clean dry gauze.  Dressing:  You may change your dressing as needed. Change the dressing with sterile gauze dressing.    Activity:  Increase activity slowly as tolerated, but follow the weight bearing instructions below.  No lifting or driving for 6 weeks.  Weight Bearing:  Non-weightbearing to the right upper extremity.  To prevent constipation: you may use a stool softener such as -  Colace (over the counter) 100 mg by mouth twice a day  Drink plenty of fluids (prune juice may be helpful) and high fiber foods Miralax (over the counter) for constipation as needed.    Itching:  If you experience itching with your medications, try taking only a single pain pill, or even half a pain pill at a time.  You may take up to 10 pain pills per day, and you can also use benadryl over the counter for itching or also to help with sleep.   Precautions:  If  you experience chest pain or shortness of breath - call 911 immediately for transfer to the hospital emergency department!!  If you develop a fever greater that 101 F, purulent drainage from wound, increased redness or drainage from wound, or calf pain-Call [facility]                                              Follow- Up Appointment:  Please call for an appointment to be seen in 2 weeks at [facility]

## 2018-06-29 NOTE — Discharge Summary (Signed)
Physician Discharge Summary  Patient ID: Tina Howard MRN: [id number] DOB/AGE: 02/02/1933 84 y.o.  Admit date: 06/27/2018 Discharge date: 06/30/18.  Admission Diagnoses:  ROTATOR CUFF ARTHROPLATHY RIGHT, PRIMARY OSTEOARTHRITIS OF RIGHT SHOULDER  Discharge Diagnoses: Patient Active Problem List   Diagnosis Date Noted  . 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 Hoarseness 03/06/2018  . HLD (hyperlipidemia) 12/29/2017  . SOB (shortness of breath) 12/29/2017  . Cardiac murmur 12/29/2017  . Mitral regurgitation 12/29/2017  . Osteopenia 12/01/2017  . Fall at home, subsequent encounter 08/02/2017  . First degree AV block 08/02/2017  . Paroxysmal A-fib ([place]) 05/26/2016  . Essential hypertension 05/26/2016  . Hypokalemia 05/26/2016  . Seasonal allergies 05/06/2016  . Obesity (BMI 30-39.9) 11/13/2014  . Back pain, chronic 05/08/2014  . Prediabetes 05/08/2014  . Screening for breast cancer 06/27/2012  . 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 Osteoarthrosis 08/20/2011   Past Medical History:  Diagnosis Date  . Allergic rhinitis   . Arthritis   . CHF (congestive heart failure) ([place]) 04/2016   developed 2 weeks after identifying AF  . Complication of anesthesia   . Dysrhythmia 04/2016   a fib    . GERD (gastroesophageal reflux disease)    occasionally has reflux/heartburn  . History of pelvic mass    s/p resection, benign  . Hypercholesteremia   . Hypertension   . Mitral regurgitation    noted echo 04/2016   . Osteoporosis    per pt report on Medical Screening Form   . PONV (postoperative nausea and vomiting)   . Pyelonephritis    remote hx   . Shortness of breath dyspnea   . Varicella zoster 2007   Transfusion: None.   Consultants (if any):   Discharged Condition: Improved  Hospital Course: Tina Howard is an 84 y.o. female who was admitted 06/27/2018 with a diagnosis of a massive irreparable rotator cuff tear  with cuff arthropathy of the right shoulder and went to the operating room on 06/27/2018 and underwent the above named procedures.    Surgeries: Procedure(s): REVERSE SHOULDER ARTHROPLASTY on 06/27/2018 Patient tolerated the surgery well. Taken to PACU where she was stabilized and then transferred to the orthopedic floor.  Continued on Xarelto 20mg  daily. Foot pumps applied bilaterally at 80 mm. Heels elevated on bed with rolled towels. No evidence of DVT. Negative Homan. Physical therapy started on day #1 for gait training and transfer. OT started day #1 for ADL and assisted devices.  Patient's IV was removed on POD1.  Pt hypokalemic while in hospital, given K+ supplementation.  Implants: All press-fit Integra system with an 11 mm stem, a standard metaphyseal body, a +3 mm humeral platform, a mini baseplate, and a 38 mm concentric +2 mm laterally offset glenosphere.  She was given perioperative antibiotics:  Anti-infectives (From admission, onward)   Start     Dose/Rate Route Frequency Ordered Stop   06/27/18 1400  ceFAZolin (ANCEF) IVPB 2g/100 mL premix     2 g 200 mL/hr over 30 Minutes Intravenous Every 6 hours 06/27/18 1152 06/28/18 0355   06/27/18 0656  ceFAZolin (ANCEF) 2-4 GM/100ML-% IVPB    Note to Pharmacy:  Cleatis Polka   : cabinet override      06/27/18 0656 06/27/18 0833   06/26/18 2215  ceFAZolin (ANCEF) IVPB 2g/100 mL premix     2 g 200 mL/hr over 30 Minutes Intravenous  Once 06/26/18 2208 06/27/18 0843    .  She was given sequential  compression devices, early ambulation, and Xarelto for DVT prophylaxis.  She benefited maximally from the hospital stay and there were no complications.    Recent vital signs:  Vitals:   06/30/18 0005 06/30/18 0723  BP: 136/63 (!) 147/67  Pulse: 79 79  Resp:    Temp: 97.6 F (36.4 C) 98.1 F (36.7 C)  SpO2: 96% 99%    Recent laboratory studies:  Lab Results  Component Value Date   HGB 11.8 (L) 06/29/2018   HGB 11.8 (L)  06/28/2018   HGB 13.0 06/20/2018   Lab Results  Component Value Date   WBC 11.5 (H) 06/29/2018   PLT 233 06/29/2018   Lab Results  Component Value Date   INR 1.18 06/20/2018   Lab Results  Component Value Date   NA 140 06/30/2018   K 3.1 (L) 06/30/2018   CL 100 06/30/2018   CO2 30 06/30/2018   BUN 18 06/30/2018   CREATININE 0.35 (L) 06/30/2018   GLUCOSE 126 (H) 06/30/2018    Discharge Medications:   Allergies as of 06/30/2018      Reactions   Amlodipine Swelling   Ankle swelling   Azo Urinary Tract Support Other (See Comments)   Skin turned orange   Erythromycin Nausea And Vomiting      Medication List    TAKE these medications   acetaminophen 500 MG tablet Commonly known as:  TYLENOL Take 500-1,000 mg by mouth 2 (two) times daily as needed for moderate pain or headache.   amoxicillin 500 MG capsule Commonly known as:  AMOXIL Take 2,000 mg by mouth See admin instructions. Take 2000 mg by mouth 1 hour prior to dental work   calcium carbonate 500 MG chewable tablet Commonly known as:  TUMS - dosed in mg elemental calcium Chew 1 tablet by mouth daily as needed for indigestion or heartburn.   cyclobenzaprine 5 MG tablet Commonly known as:  FLEXERIL Take 5 mg by mouth 2 (two) times daily.   diclofenac sodium 1 % Gel Commonly known as:  VOLTAREN Apply 1 application topically as needed (foot pain).   diltiazem 120 MG 24 hr capsule Commonly known as:  TIAZAC Take 120 mg by mouth once daily at night   docusate calcium 240 MG capsule Commonly known as:  SURFAK Take 240 mg by mouth at bedtime.   flecainide 50 MG tablet Commonly known as:  TAMBOCOR Take 1 tablet (50 mg total) by mouth 2 (two) times daily.   furosemide 40 MG tablet Commonly known as:  LASIX Take 40 mg by mouth daily.   hydrALAZINE 25 MG tablet Commonly known as:  APRESOLINE Take 1 tablet (25 mg total) by mouth 3 (three) times daily. What changed:  when to take this   loratadine 10 MG  tablet Commonly known as:  CLARITIN Take 10 mg by mouth daily as needed for allergies.   losartan 100 MG tablet Commonly known as:  COZAAR Take 100 mg by mouth daily.   ondansetron 4 MG tablet Commonly known as:  ZOFRAN Take 1 tablet (4 mg total) by mouth every 6 (six) hours as needed for nausea.   oxyCODONE 5 MG immediate release tablet Commonly known as:  Oxy IR/ROXICODONE Take 1-2 tablets (5-10 mg total) by mouth every 4 (four) hours as needed for moderate pain.   potassium chloride 10 MEQ tablet Commonly known as:  K-DUR,KLOR-CON Take 2 tablets (20 mEq total) by mouth 2 (two) times daily for 5 days. Was taking 3 tablets every am x  4 days What changed:  when to take this   pravastatin 20 MG tablet Commonly known as:  PRAVACHOL Take 1 tablet (20 mg total) by mouth daily. What changed:  when to take this   rivaroxaban 20 MG Tabs tablet Commonly known as:  XARELTO Take 1 tablet (20 mg total) by mouth daily with supper.   scopolamine 1 MG/3DAYS Commonly known as:  TRANSDERM-SCOP Place 1 patch (1.5 mg total) onto the skin every 3 (three) days. Start taking on:  07/01/2018   timolol 0.5 % ophthalmic solution Commonly known as:  TIMOPTIC Place 1 drop into both eyes daily.   traMADol 50 MG tablet Commonly known as:  ULTRAM Take 1-2 tablets (50-100 mg total) by mouth every 6 (six) hours as needed for moderate pain.   Vitamin D3 2000 units capsule Take 1,000 Units by mouth daily.       Diagnostic Studies: Mr Shoulder Right Wo Contrast  Result Date: 06/20/2018 CLINICAL DATA:  Status post fall 6 years ago. Right shoulder pain anteriorly. EXAM: MRI OF THE RIGHT SHOULDER WITHOUT CONTRAST TECHNIQUE: Multiplanar, multisequence MR imaging of the shoulder was performed. No intravenous contrast was administered. COMPARISON:  None. FINDINGS: Rotator cuff: Moderate tendinosis of the supraspinatus tendon with severe thinning of peripheral supraspinatus tendon and a small high-grade  partial-thickness bursal surface tear with a possible small full-thickness component. Infraspinatus tendon is intact. Teres minor tendon is intact. Subscapularis tendon is intact. Muscles: Mild muscle atrophy of the supraspinatus and infraspinatus muscles. Biceps long head:  Intact. Acromioclavicular Joint: Moderate arthropathy of the acromioclavicular joint. Type I acromion. No subacromial/subdeltoid bursal fluid. Glenohumeral Joint: Trace joint effusion with mild synovitis. Extensive full-thickness cartilage loss of the glenohumeral joint. Large loose body along the superior aspect of the humeral head measuring 14 mm. Labrum:  Diffuse labral degeneration. Bones:  No acute osseous abnormality.  No aggressive osseous lesion. Other: No fluid collection or hematoma. IMPRESSION: 1. Severe advanced osteoarthritis of the right glenohumeral joint. Small joint effusion with synovitis. 2. Moderate tendinosis of the supraspinatus tendon with severe thinning of peripheral supraspinatus tendon and a small high-grade partial-thickness bursal surface tear with a possible small full-thickness component. Electronically Signed   By: Kathreen Devoid   On: 06/20/2018 09:43   Dg Shoulder Right Port  Result Date: 06/27/2018 CLINICAL DATA:  84 year old female post right shoulder replacement. Subsequent encounter. EXAM: PORTABLE RIGHT SHOULDER COMPARISON:  06/19/2018 preoperative right shoulder MR. FINDINGS: Post placement of reversed right shoulder arthroplasty which appears in satisfactory position without complication noted. Visualized lungs without acute abnormality. IMPRESSION: Post right shoulder replacement. Electronically Signed   By: Genia Del M.D.   On: 06/27/2018 11:17   Disposition: Plan will be for discharge to SNF on 06/30/18, will discharge on Klor-con 20 mEq for 5 days, recheck potassium at rehab facility.   Contact information for follow-up providers    Lattie Corns, PA-C Follow up in 14 day(s).    Specialty:  Physician Assistant Why:  Levert Feinstein Removal Contact information: [place] [address] [phone]            Contact information for after-discharge care    Destination    [facility] .   Service:  Skilled Nursing Contact information: [address] [place] [phone]                 Signed: Judson Roch PA-C 06/30/2018, 7:57 AM

## 2018-06-29 NOTE — Progress Notes (Signed)
Occupational Therapy Treatment Patient Details Name: Tina Howard MRN: [id number] DOB: 1933/04/21 Today's Date: 06/29/2018    History of present illness Pt is an 84 yo female POD#2 s/p R reverse TSA with a PMHx significant for CHF, A-fib, arthritis, GERD, HTN, mitral regurgitation, osteoporosis, hypercholesteremia, and hx PONV.   OT comments  Pt seen for OT tx this date. Pt with nurse tech who confirms pt was just up and got back to bed directly before OT's arrival. Pt declines to get out of bed again but agreeable to bed level tx session. Pt agreeable to OT adjusting polar care and sling to ensure proper positioning. Polar care noted to be directly in contact with skin 2/2 blue towel becoming displaced. OT readjusted to provide adequate coverage for skin to maximize safety and skin integrity and optimize positioning. Educated pt and nurse tech in optimal positioning, both verbalized understanding. Pt instructed in review of polar care mgt, sling mgt, UB/LB dressing techniques, underarm grooming and bathing techniques, falls prevention, home/routines modifications, and elbow/wrist/hand ROM exercises. Pt endorses intrascalene block still in place and has numbness/tingling and difficulty with bicep activation. Supine shoulder PROM exercises deferred. Will perform once block has resolved. Pt verbalized understanding and would benefit from additional training/education to maximize recall and carryover of learned techniques in order to maximize adherence to surgeon's rehab protocol, maximize safety during recovery, and maximize independence while minimizing falls risk and caregiver burden. STR recommendation remains most appropriate. Will continue to progress.     Follow Up Recommendations  SNF    Equipment Recommendations  3 in 1 bedside commode    Recommendations for Other Services      Precautions / Restrictions Precautions Precautions: Shoulder;Fall Shoulder Interventions: Shoulder  sling/immobilizer;Shoulder abduction pillow;At all times;Off for dressing/bathing/exercises Precaution Booklet Issued: Yes (comment) Restrictions Weight Bearing Restrictions: Yes RUE Weight Bearing: Non weight bearing       Mobility Bed Mobility   General bed mobility comments: pt reports having just gotten back to bed with nurse tech, who confirms. pt declines with OT  Transfers       General transfer comment: pt reports having just gotten back to bed with nurse tech, who confirms. pt declines with OT    Balance Overall balance assessment: Needs assistance                           ADL either performed or assessed with clinical judgement   ADL Overall ADL's : Needs assistance/impaired     Grooming: Sitting;Modified independent Grooming Details (indicate cue type and reason): instructed in RUE underarm grooming techniques to maintain precautions Upper Body Bathing: Sitting;Minimal assistance Upper Body Bathing Details (indicate cue type and reason): instructed in RUE underarm bathing techniques to maintain precautions, would require min assist for back and LUE Lower Body Bathing: Sit to/from stand;Minimal assistance;Moderate assistance   Upper Body Dressing : Sitting;Moderate assistance Upper Body Dressing Details (indicate cue type and reason): pt instructed in hemi techniques for UB dressing with verbal instruction and visual demo. Discussed clothing types and what pt has purchased in preparation for surgery and how to don/doff (button up vs overhead shirt) Lower Body Dressing: Sit to/from stand;Minimal assistance;Moderate assistance   Toilet Transfer: Min Geneticist, molecular Details (indicate cue type and reason): SPC used with LUE         Functional mobility during ADLs: Min guard;Cane       Vision Baseline Vision/History: Wears glasses Wears Glasses: At all times  Patient Visual Report: No change from baseline     Perception      Praxis      Cognition Arousal/Alertness: Awake/alert Behavior During Therapy: WFL for tasks assessed/performed Overall Cognitive Status: Within Functional Limits for tasks assessed                                          Exercises Other Exercises Other Exercises: Pt instructed in compression stocking mgt and polar care mgt with wear schedule and techniques for donning.  Other Exercises: Pt instructed in falls prevention strategies Other Exercises: Pt instructed in elbow, wrist, and hand ROM exercises. Will benefit from additional training.   Shoulder Instructions       General Comments polar care and sling checked. Polar care noted to be directly in contact with skin 2/2 blue towel being displaced. OT readjusted to provide adequate coverage for skin to maximize safety and skin integrity and optimize positioning.    Pertinent Vitals/ Pain       Pain Assessment: 0-10 Pain Score: 3  Pain Location: R shoulder (pt with interscalene block) Pain Descriptors / Indicators: Tingling;Numbness;Discomfort Pain Intervention(s): Limited activity within patient's tolerance;Monitored during session;Repositioned;Ice applied  Home Living                                          Prior Functioning/Environment              Frequency  Min 1X/week        Progress Toward Goals  OT Goals(current goals can now be found in the care plan section)  Progress towards OT goals: Progressing toward goals  Acute Rehab OT Goals Patient Stated Goal: get through rehab and return to PLOF  OT Goal Formulation: With patient Time For Goal Achievement: 07/12/18 Potential to Achieve Goals: Good  Plan Discharge plan remains appropriate;Frequency remains appropriate    Co-evaluation                 AM-PAC PT "6 Clicks" Daily Activity     Outcome Measure   Help from another person eating meals?: A Little Help from another person taking care of personal  grooming?: A Little Help from another person toileting, which includes using toliet, bedpan, or urinal?: A Little Help from another person bathing (including washing, rinsing, drying)?: A Lot Help from another person to put on and taking off regular upper body clothing?: A Lot Help from another person to put on and taking off regular lower body clothing?: A Lot 6 Click Score: 15    End of Session    OT Visit Diagnosis: Other abnormalities of gait and mobility (R26.89);Pain Pain - Right/Left: Right Pain - part of body: Shoulder   Activity Tolerance Patient tolerated treatment well   Patient Left in bed;with call bell/phone within reach;with bed alarm set;Other (comment)(polar care, sling in place)   Nurse Communication          Time: 9528-4132 OT Time Calculation (min): 29 min  Charges: OT General Charges $OT Visit: 1 Visit OT Treatments $Self Care/Home Management : 23-37 mins   Jeni Salles, MPH, MS, OTR/L ascom [phone] 06/29/18, 3:39 PM

## 2018-06-29 NOTE — Progress Notes (Signed)
Physical Therapy Treatment Patient Details Name: Tina Howard MRN: [id number] DOB: August 19, 1933 Today's Date: 06/29/2018    History of Present Illness Pt is an 84 yo female POD#1 s/p R reverse TSA with a PMHx significant for CHF, A-fib, arthritis, GERD, HTN, mitral regurgitation, osteoporosis, hypercholesteremia, and hx PONV.    PT Comments    Pt in recliner ready for session.  Pt was able to stand and ambulate 120' with SPC and min guard.  Pt with very slow gait with frequent standing rest breaks and requires min verbal cues for gait sequence.  While pt has not LOB or buckling, pt remains at increased risk for falls due to decreased step height and length with gait, shoulder immobilizer limiting normal arm swing movements and reliance on [facility] for balance.  SNF remains appropriate upon discharge despite gait distance.   Follow Up Recommendations  SNF     Equipment Recommendations  Cane    Recommendations for Other Services       Precautions / Restrictions Precautions Precautions: Shoulder;Fall Shoulder Interventions: Shoulder sling/immobilizer;Shoulder abduction pillow;At all times;Off for dressing/bathing/exercises Restrictions Weight Bearing Restrictions: Yes RUE Weight Bearing: Non weight bearing    Mobility  Bed Mobility Overal bed mobility: Needs Assistance Bed Mobility: Sit to Supine       Sit to supine: Mod assist   General bed mobility comments: assist for LE's  Transfers Overall transfer level: Needs assistance Equipment used: Straight cane Transfers: Sit to/from Stand Sit to Stand: Min guard;Min assist Stand pivot transfers: Min guard;Min assist          Ambulation/Gait Ambulation/Gait assistance: Min guard;Min assist Gait Distance (Feet): 120 Feet   Gait Pattern/deviations: Step-to pattern Gait velocity: decreased Gait velocity interpretation: <1.31 ft/sec, indicative of household ambulator General Gait Details: Generally steady today but very  slow guarded gait.  Verbal cues for gait sequence and can use.   Stairs             Wheelchair Mobility    Modified Rankin (Stroke Patients Only)       Balance Overall balance assessment: Needs assistance Sitting-balance support: No upper extremity supported;Feet supported Sitting balance-Leahy Scale: Good Sitting balance - Comments: steady sitting reaching within BOS with L UE   Standing balance support: No upper extremity supported Standing balance-Leahy Scale: Good Standing balance comment: steady static standing reaching within BOS                            Cognition Arousal/Alertness: Awake/alert Behavior During Therapy: WFL for tasks assessed/performed Overall Cognitive Status: Within Functional Limits for tasks assessed                                        Exercises      General Comments        Pertinent Vitals/Pain Pain Assessment: 0-10 Pain Score: 3  Pain Location: R shoulder (pt with interscalene block) Pain Descriptors / Indicators: Tingling;Numbness;Discomfort Pain Intervention(s): Limited activity within patient's tolerance;Monitored during session    Home Living                      Prior Function            PT Goals (current goals can now be found in the care plan section) Progress towards PT goals: Progressing toward goals    Frequency  BID      PT Plan Current plan remains appropriate    Co-evaluation              AM-PAC PT "6 Clicks" Daily Activity  Outcome Measure  Difficulty turning over in bed (including adjusting bedclothes, sheets and blankets)?: A Lot Difficulty moving from lying on back to sitting on the side of the bed? : Unable Difficulty sitting down on and standing up from a chair with arms (e.g., wheelchair, bedside commode, etc,.)?: Unable Help needed moving to and from a bed to chair (including a wheelchair)?: A Little Help needed walking in hospital room?: A  Little Help needed climbing 3-5 steps with a railing? : A Little 6 Click Score: 13    End of Session Equipment Utilized During Treatment: Gait belt;Other (comment) Activity Tolerance: Patient tolerated treatment well Patient left: in bed;with bed alarm set;with call bell/phone within reach Nurse Communication: Mobility status Pain - Right/Left: Right Pain - part of body: Shoulder     Time: 9357-0177 PT Time Calculation (min) (ACUTE ONLY): 25 min  Charges:  $Gait Training: 23-37 mins                    G Codes:       Chesley Noon, PTA 06/29/18, 1:13 PM

## 2018-06-29 NOTE — Progress Notes (Signed)
Subjective: 2 Days Post-Op Procedure(s) (LRB): REVERSE SHOULDER ARTHROPLASTY (Right) Patient reports pain as mild.   Patient is well, but has had some minor complaints of nausea/vomiting Plan is to go Skilled nursing facility after hospital stay. Negative for chest pain and shortness of breath Fever: no Gastrointestinal:Positive for nausea and vomiting  Objective: Vital signs in last 24 hours: Temp:  [97.7 F (36.5 C)-98.2 F (36.8 C)] 98.2 F (36.8 C) (07/04 0728) Pulse Rate:  [68-77] 77 (07/04 0728) Resp:  [18] 18 (07/04 0728) BP: (137-170)/(70-81) 166/77 (07/04 0728) SpO2:  [94 %-100 %] 94 % (07/04 0728)  Intake/Output from previous day: No intake or output data in the 24 hours ending 06/29/18 0911  Intake/Output this shift: No intake/output data recorded.  Labs: Recent Labs    06/28/18 0321 06/29/18 0449  HGB 11.8* 11.8*   Recent Labs    06/28/18 0321 06/29/18 0449  WBC 11.3* 11.5*  RBC 4.28 4.15  HCT 35.2 34.4*  PLT 226 233   Recent Labs    06/28/18 0321 06/28/18 1521 06/29/18 0449  NA 137  --  139  K 2.9* 3.7 3.8  CL 100  --  104  CO2 27  --  30  BUN 14  --  16  CREATININE 0.38*  --  0.44  GLUCOSE 151*  --  147*  CALCIUM 8.5*  --  8.4*   No results for input(s): LABPT, INR in the last 72 hours.   EXAM General - Patient is Alert, Appropriate and Oriented Extremity - ABD soft Intact pulses distally Incision: dressing C/D/I No cellulitis present  Pt still with decreased sensation to the right arm, improved in the axillary nerve distribution compared to yesterday.  Increased sensation to the forearm this morning. Dressing/Incision - clean, dry, no drainage Motor Function - intact, moving foot and toes well on exam.  Abdomen soft with normal BS.  Past Medical History:  Diagnosis Date  . Allergic rhinitis   . Arthritis   . CHF (congestive heart failure) ([place]) 04/2016   developed 2 weeks after identifying AF  . Complication of anesthesia   .  Dysrhythmia 04/2016   a fib    . GERD (gastroesophageal reflux disease)    occasionally has reflux/heartburn  . History of pelvic mass    s/p resection, benign  . Hypercholesteremia   . Hypertension   . Mitral regurgitation    noted echo 04/2016   . Osteoporosis    per pt report on Medical Screening Form   . PONV (postoperative nausea and vomiting)   . Pyelonephritis    remote hx   . Shortness of breath dyspnea   . Varicella zoster 2007   Assessment/Plan: 2 Days Post-Op Procedure(s) (LRB): REVERSE SHOULDER ARTHROPLASTY (Right) Active Problems:   Status post reverse total shoulder replacement, right  Estimated body mass index is 36.84 kg/m as calculated from the following:   Height as of this encounter: 5\' 1"  (1.549 m).   Weight as of this encounter: 88.5 kg (195 lb). Advance diet Up with therapy D/C IV fluids when tolerating po intake.  Labs reviewed this AM, hypokalemia resolved. Pt is nauseated this morning, continue Zofran, phenergan, added scopolamine patch. Pt should continue working with PT/OT today. Will monitor continued nausea and vomiting. Pt has had a BM. Based on progression of nausea will plan for discharge to SNF tomorrow.  DVT Prophylaxis - Xarelto, Foot Pumps and TED hose Non-weightbearing to the right upper extremity.  Raquel Shawnie Nicole, PA-C [facility]  Orthopaedic Surgery 06/29/2018, 9:11 AM

## 2018-06-30 DIAGNOSIS — Z9181 History of falling: Secondary | ICD-10-CM | POA: Diagnosis not present

## 2018-06-30 DIAGNOSIS — E7849 Other hyperlipidemia: Secondary | ICD-10-CM | POA: Diagnosis not present

## 2018-06-30 DIAGNOSIS — E669 Obesity, unspecified: Secondary | ICD-10-CM | POA: Diagnosis not present

## 2018-06-30 DIAGNOSIS — I440 Atrioventricular block, first degree: Secondary | ICD-10-CM | POA: Diagnosis not present

## 2018-06-30 DIAGNOSIS — R7303 Prediabetes: Secondary | ICD-10-CM | POA: Diagnosis not present

## 2018-06-30 DIAGNOSIS — I340 Nonrheumatic mitral (valve) insufficiency: Secondary | ICD-10-CM | POA: Diagnosis not present

## 2018-06-30 DIAGNOSIS — I480 Paroxysmal atrial fibrillation: Secondary | ICD-10-CM | POA: Diagnosis not present

## 2018-06-30 DIAGNOSIS — E871 Hypo-osmolality and hyponatremia: Secondary | ICD-10-CM | POA: Diagnosis not present

## 2018-06-30 DIAGNOSIS — I509 Heart failure, unspecified: Secondary | ICD-10-CM | POA: Diagnosis not present

## 2018-06-30 DIAGNOSIS — Z471 Aftercare following joint replacement surgery: Secondary | ICD-10-CM | POA: Diagnosis not present

## 2018-06-30 DIAGNOSIS — M25511 Pain in right shoulder: Secondary | ICD-10-CM | POA: Diagnosis not present

## 2018-06-30 DIAGNOSIS — R29898 Other symptoms and signs involving the musculoskeletal system: Secondary | ICD-10-CM | POA: Diagnosis not present

## 2018-06-30 DIAGNOSIS — H35033 Hypertensive retinopathy, bilateral: Secondary | ICD-10-CM | POA: Diagnosis not present

## 2018-06-30 DIAGNOSIS — R262 Difficulty in walking, not elsewhere classified: Secondary | ICD-10-CM | POA: Diagnosis not present

## 2018-06-30 DIAGNOSIS — M150 Primary generalized (osteo)arthritis: Secondary | ICD-10-CM | POA: Diagnosis not present

## 2018-06-30 DIAGNOSIS — M5441 Lumbago with sciatica, right side: Secondary | ICD-10-CM | POA: Diagnosis not present

## 2018-06-30 DIAGNOSIS — Z96611 Presence of right artificial shoulder joint: Secondary | ICD-10-CM | POA: Diagnosis not present

## 2018-06-30 DIAGNOSIS — I110 Hypertensive heart disease with heart failure: Secondary | ICD-10-CM | POA: Diagnosis not present

## 2018-06-30 DIAGNOSIS — M25611 Stiffness of right shoulder, not elsewhere classified: Secondary | ICD-10-CM | POA: Diagnosis not present

## 2018-06-30 DIAGNOSIS — R110 Nausea: Secondary | ICD-10-CM | POA: Diagnosis not present

## 2018-06-30 DIAGNOSIS — M6281 Muscle weakness (generalized): Secondary | ICD-10-CM | POA: Diagnosis not present

## 2018-06-30 DIAGNOSIS — G8929 Other chronic pain: Secondary | ICD-10-CM | POA: Diagnosis not present

## 2018-06-30 DIAGNOSIS — E876 Hypokalemia: Secondary | ICD-10-CM | POA: Diagnosis not present

## 2018-06-30 DIAGNOSIS — Z7401 Bed confinement status: Secondary | ICD-10-CM | POA: Diagnosis not present

## 2018-06-30 LAB — BASIC METABOLIC PANEL
Anion gap: 10 (ref 5–15)
BUN: 18 mg/dL (ref 8–23)
CALCIUM: 8.3 mg/dL — AB (ref 8.9–10.3)
CO2: 30 mmol/L (ref 22–32)
Chloride: 100 mmol/L (ref 98–111)
Creatinine, Ser: 0.35 mg/dL — ABNORMAL LOW (ref 0.44–1.00)
GFR calc non Af Amer: >60 mL/min (ref >60)
GLUCOSE: 126 mg/dL — AB (ref 70–99)
Potassium: 3.1 mmol/L — ABNORMAL LOW (ref 3.5–5.1)
Sodium: 140 mmol/L (ref 135–145)

## 2018-06-30 LAB — SURGICAL PATHOLOGY

## 2018-06-30 MED ORDER — POTASSIUM CHLORIDE CRYS ER 10 MEQ PO TBCR: 20.0000 meq | EXTENDED_RELEASE_TABLET | Freq: Two times a day (BID) | ORAL | 0 refills | Status: DC

## 2018-06-30 MED ORDER — POTASSIUM CHLORIDE CRYS ER 20 MEQ PO TBCR
40.0000 meq | EXTENDED_RELEASE_TABLET | ORAL | Status: AC
  Administered 2018-06-30 (×2): 40 meq via ORAL
  Filled 2018-06-30 (×2): qty 2

## 2018-06-30 MED ORDER — POTASSIUM CHLORIDE CRYS ER 20 MEQ PO TBCR: 40.0000 meq | EXTENDED_RELEASE_TABLET | Freq: Every day | ORAL | Status: DC

## 2018-06-30 NOTE — Progress Notes (Signed)
Patient is being discharged to [facility]. Report called to [facility]. NT prepared patient for transportation via EMS. IV removed, belongings packed. EMS called.

## 2018-06-30 NOTE — Progress Notes (Signed)
Occupational Therapy Treatment Patient Details Name: Tina Howard MRN: [id number] DOB: 02-04-1933 Today's Date: 06/30/2018    History of present illness Pt is an 84 yo female s/p R reverse TSA with a PMHx significant for CHF, A-fib, arthritis, GERD, HTN, mitral regurgitation, osteoporosis, hypercholesteremia, and hx PONV.   OT comments  Pt seen for OT tx this date. Pt eager to work with therapist and prepare for transfer by EMS to SNF this afternoon. Pt performed grooming tasks while standing at the sink with verbal cues and instruction to improve independence while maintaining RUE precautions. Pt instructed in functional mobility training with feet positioning and scooting towards EOB to improve bringing center of gravity over base of support (her feet) to improve safety and independence with transfer, with transfer improving from [place] [facility] to [place]. Pt required mod assist to doff and don clean gown and max assist for shoulder sling/immobilizer application. While out of the sling, pt instructed in elbow AROM, pt now with improved sensation and able to actively flex/ext elbow with active assist from OT. Shoulder PROM ex deferred 2:2 still with impaired sensation proximal of elbow. Also, EMS called already for transport to SNF. Pt expressed gratitude for assist and training this session and eager to continue therapy efforts at [place].    Follow Up Recommendations  SNF    Equipment Recommendations  3 in 1 bedside commode    Recommendations for Other Services      Precautions / Restrictions Precautions Precautions: Shoulder;Fall Shoulder Interventions: Shoulder sling/immobilizer;Shoulder abduction pillow;At all times;Off for dressing/bathing/exercises Precaution Booklet Issued: Yes (comment) Restrictions Weight Bearing Restrictions: Yes RUE Weight Bearing: Non weight bearing       Mobility Bed Mobility Overal bed mobility: Needs Assistance Bed Mobility: Supine to Sit;Sit to Supine      Supine to sit: Supervision;HOB elevated Sit to supine: Supervision;HOB elevated   General bed mobility comments: increased time/effort and use of bed rail for sup>sit EOB  Transfers Overall transfer level: Needs assistance Equipment used: Straight cane Transfers: Sit to/from Stand Sit to Stand: Min assist;Min guard Stand pivot transfers: Min guard;Min assist       General transfer comment: initial attempts requiring pt to lean back/front to gain momentum. Pt instructed to scoot closer to the EOB and bring B feet underneath her more, with improved transfer requiring CGA. Pt stated "Wow, that was a lot easier."    Balance Overall balance assessment: Needs assistance Sitting-balance support: No upper extremity supported;Feet supported Sitting balance-Leahy Scale: Good Sitting balance - Comments: steady sitting reaching within BOS with L UE   Standing balance support: No upper extremity supported Standing balance-Leahy Scale: Good Standing balance comment: steady static standing reaching within BOS                           ADL either performed or assessed with clinical judgement   ADL Overall ADL's : Needs assistance/impaired     Grooming: Standing;Min guard;Wash/dry face;Oral care Grooming Details (indicate cue type and reason): Stood at sink with SBA/CGA to brush teeth and wash face with non-dom L hand, using R hand to hold and squeeze toothpaste onto toothbrush. Pt instructed in standing positioning at sink to maintain RUE precautions and minimize risk of RUE in sling resting on counter          Upper Body Dressing : Sitting;Moderate assistance Upper Body Dressing Details (indicate cue type and reason): Pt required mod assist with doffing and donning clean gown  Toilet Transfer: Government social research officer Details (indicate cue type and reason): SPC used with LUE Toileting- Clothing Manipulation and Hygiene: Sit to/from stand;Maximal  assistance Toileting - Clothing Manipulation Details (indicate cue type and reason): pt states she requires assist 2/2 her arms being too short and difficult for her to perform hygiene with non-dom arm     Functional mobility during ADLs: Min guard;Cane       Vision Baseline Vision/History: Wears glasses Wears Glasses: At all times Patient Visual Report: No change from baseline     Perception     Praxis      Cognition Arousal/Alertness: Awake/alert Behavior During Therapy: WFL for tasks assessed/performed Overall Cognitive Status: Within Functional Limits for tasks assessed                                          Exercises Other Exercises Other Exercises: Pt instructed in elbow AROM, pt now with improved sensation and able to actively flex/ext elbow with active assist from OT. Shoulder PROM ex deferred 2:2 still with impaired sensation proximal of elbow. Also, EMS called already for transport to SNF.   Shoulder Instructions       General Comments Polar care and sling/immobilizer checked. In optimal positioning with proper skin coverage. RN removed IV just prior to OT session. IV site noted to be bleeding. Pressure applied. RN notified and promptly returned and addressed. IV site clean and dry for the remainder of the session.     Pertinent Vitals/ Pain       Pain Assessment: 0-10 Pain Score: 5  Pain Location: R shoulder after doffing donning gown and sling Pain Descriptors / Indicators: Aching;Sore;Discomfort Pain Intervention(s): Limited activity within patient's tolerance;Monitored during session;Ice applied;Repositioned  Home Living                                          Prior Functioning/Environment              Frequency  Min 1X/week        Progress Toward Goals  OT Goals(current goals can now be found in the care plan section)  Progress towards OT goals: Progressing toward goals  Acute Rehab OT Goals Patient  Stated Goal: get through rehab and return to PLOF  OT Goal Formulation: With patient Time For Goal Achievement: 07/12/18 Potential to Achieve Goals: Good  Plan Discharge plan remains appropriate;Frequency remains appropriate    Co-evaluation                 AM-PAC PT "6 Clicks" Daily Activity     Outcome Measure   Help from another person eating meals?: None Help from another person taking care of personal grooming?: None Help from another person toileting, which includes using toliet, bedpan, or urinal?: A Lot Help from another person bathing (including washing, rinsing, drying)?: A Lot Help from another person to put on and taking off regular upper body clothing?: A Lot Help from another person to put on and taking off regular lower body clothing?: A Lot 6 Click Score: 16    End of Session Equipment Utilized During Treatment: Gait belt;Rolling walker  OT Visit Diagnosis: Other abnormalities of gait and mobility (R26.89);Pain Pain - Right/Left: Right Pain - part of body: Shoulder   Activity Tolerance  Patient tolerated treatment well   Patient Left in bed;with call bell/phone within reach;with bed alarm set;Other (comment)(polar care and sling in place)   Nurse Communication          Time: 2178-3754 OT Time Calculation (min): 46 min  Charges: OT General Charges $OT Visit: 1 Visit OT Treatments $Self Care/Home Management : 38-52 mins   Jeni Salles, MPH, MS, OTR/L ascom [phone] 06/30/18, 12:28 PM

## 2018-06-30 NOTE — Care Management Important Message (Signed)
Important Message  Patient Details  Name: Tina Howard MRN: [id number] Date of Birth: 03-22-1933   Medicare Important Message Given:  Yes    [name] 06/30/2018, 10:46 AM

## 2018-06-30 NOTE — Progress Notes (Signed)
Subjective: 3 Days Post-Op Procedure(s) (LRB): REVERSE SHOULDER ARTHROPLASTY (Right) Patient reports pain as mild.   Patient is well but has hypokalemia today Plan is to go Skilled nursing facility after hospital stay. Negative for chest pain and shortness of breath Fever: no Gastrointestinal:Negative for nausea and vomiting  Objective: Vital signs in last 24 hours: Temp:  [97.6 F (36.4 C)-98.8 F (37.1 C)] 98.1 F (36.7 C) (07/05 0723) Pulse Rate:  [70-79] 79 (07/05 0723) Resp:  [19-20] 19 (07/04 2157) BP: (133-147)/(59-67) 147/67 (07/05 0723) SpO2:  [95 %-99 %] 99 % (07/05 0723)  Intake/Output from previous day:  Intake/Output Summary (Last 24 hours) at 06/30/2018 0753 Last data filed at 06/29/2018 1835 Gross per 24 hour  Intake 340 ml  Output -  Net 340 ml    Intake/Output this shift: No intake/output data recorded.  Labs: Recent Labs    06/28/18 0321 06/29/18 0449  HGB 11.8* 11.8*   Recent Labs    06/28/18 0321 06/29/18 0449  WBC 11.3* 11.5*  RBC 4.28 4.15  HCT 35.2 34.4*  PLT 226 233   Recent Labs    06/29/18 0449 06/30/18 0412  NA 139 140  K 3.8 3.1*  CL 104 100  CO2 30 30  BUN 16 18  CREATININE 0.44 0.35*  GLUCOSE 147* 126*  CALCIUM 8.4* 8.3*   No results for input(s): LABPT, INR in the last 72 hours.   EXAM General - Patient is Alert, Appropriate and Oriented Extremity - ABD soft Intact pulses distally Incision: dressing C/D/I No cellulitis present  Pt still with decreased sensation to the right arm, improved in the axillary nerve distribution compared to yesterday.  Increased sensation to the forearm this morning. Dressing/Incision - clean, dry, no drainage Motor Function - intact, moving foot and toes well on exam.  Abdomen soft with normal BS.  Past Medical History:  Diagnosis Date  . Allergic rhinitis   . Arthritis   . CHF (congestive heart failure) ([place]) 04/2016   developed 2 weeks after identifying AF  . Complication of  anesthesia   . Dysrhythmia 04/2016   a fib    . GERD (gastroesophageal reflux disease)    occasionally has reflux/heartburn  . History of pelvic mass    s/p resection, benign  . Hypercholesteremia   . Hypertension   . Mitral regurgitation    noted echo 04/2016   . Osteoporosis    per pt report on Medical Screening Form   . PONV (postoperative nausea and vomiting)   . Pyelonephritis    remote hx   . Shortness of breath dyspnea   . Varicella zoster 2007   Assessment/Plan: 3 Days Post-Op Procedure(s) (LRB): REVERSE SHOULDER ARTHROPLASTY (Right) Active Problems:   Status post reverse total shoulder replacement, right  Estimated body mass index is 36.84 kg/m as calculated from the following:   Height as of this encounter: 5\' 1"  (1.549 m).   Weight as of this encounter: 88.5 kg (195 lb). Advance diet Up with therapy D/C IV fluids when tolerating po intake.  Labs reviewed this AM, K+ 3.1, will supplement.  Plan on discharging with increased potassium dosage for 3-4 days, recheck potassium at rehab and if still hypokalemic discuss with PCP. Nausea improved this AM.  Pt has had a BM Plan will be for discharge to SNF today.  DVT Prophylaxis - Xarelto, Foot Pumps and TED hose Non-weightbearing to the right upper extremity.  Raquel James, PA-C [facility] Orthopaedic Surgery 06/30/2018, 7:53 AM

## 2018-06-30 NOTE — Progress Notes (Signed)
Patient is medically stable for D/C to [facility] today. Per [facility] admissions coordinator at [facility] patient can come today to room 212. RN will call report at [phone] and arrange EMS for transport. Clinical Education officer, museum (CSW) sent D/C orders to [employer] via [employer]. Patient is aware of above. CSW contacted patient's son [name] and made him aware of above. Please reconsult if future social work needs arise. CSW signing off.   [employer], LCSW [phone]

## 2018-06-30 NOTE — Care Management Important Message (Signed)
Important Message  Patient Details  Name: Tina Howard MRN: [id number] Date of Birth: [birth year]   Medicare Important Message Given:  Yes    [name] 06/30/2018, 10:10 AM

## 2018-06-30 NOTE — Plan of Care (Signed)
  Problem: Education: Goal: Knowledge of General Education information will improve Outcome: Progressing   Problem: Health Behavior/Discharge Planning: Goal: Ability to manage health-related needs will improve Outcome: Progressing   Problem: Clinical Measurements: Goal: Ability to maintain clinical measurements within normal limits will improve Outcome: Progressing Goal: Will remain free from infection Outcome: Progressing Goal: Diagnostic test results will improve Outcome: Progressing Goal: Respiratory complications will improve Outcome: Progressing Goal: Cardiovascular complication will be avoided Outcome: Progressing   Problem: Activity: Goal: Risk for activity intolerance will decrease Outcome: Progressing   Problem: Nutrition: Goal: Adequate nutrition will be maintained Outcome: Progressing   Problem: Coping: Goal: Level of anxiety will decrease Outcome: Progressing   Problem: Elimination: Goal: Will not experience complications related to bowel motility Outcome: Progressing Goal: Will not experience complications related to urinary retention Outcome: Progressing   Problem: Pain Managment: Goal: General experience of comfort will improve Outcome: Progressing   Problem: Safety: Goal: Ability to remain free from injury will improve Outcome: Progressing   Problem: Skin Integrity: Goal: Risk for impaired skin integrity will decrease Outcome: Progressing   Problem: Education: Goal: Knowledge of the prescribed therapeutic regimen will improve Outcome: Progressing Goal: Understanding of activity limitations/precautions following surgery will improve Outcome: Progressing   Problem: Activity: Goal: Ability to tolerate increased activity will improve Outcome: Progressing   Problem: Pain Management: Goal: Pain level will decrease with appropriate interventions Outcome: Progressing

## 2018-06-30 NOTE — Progress Notes (Signed)
Physical Therapy Treatment Patient Details Name: Tina Howard MRN: [id number] DOB: 1933-07-26 Today's Date: 06/30/2018    History of Present Illness Pt is an 84 yo female s/p R reverse TSA with a PMHx significant for CHF, A-fib, arthritis, GERD, HTN, mitral regurgitation, osteoporosis, hypercholesteremia, and hx PONV.    PT Comments    Pt requires cueing for gait technique and sequenicing with SPC but improved with repetition and distance ambulated.  Pt with mild loss of balance stepping over small threshold going into bathroom requiring min assist to steady.  Pt reporting having a lot of pain in L hip last night (has h/o L hip pain and arthritis); mild soreness in L hip beginning of session and increased to 6-7/10 end of session (R shoulder pain 2/10 during session); nurse notified regarding pain.  Will continue to progress pt with strengthening, balance, and progressive functional mobility per pt tolerance.   Follow Up Recommendations  SNF     Equipment Recommendations  Cane    Recommendations for Other Services OT consult     Precautions / Restrictions Precautions Precautions: Shoulder;Fall Shoulder Interventions: Shoulder sling/immobilizer;Shoulder abduction pillow;At all times;Off for dressing/bathing/exercises Precaution Booklet Issued: Yes (comment) Restrictions Weight Bearing Restrictions: Yes RUE Weight Bearing: Non weight bearing    Mobility  Bed Mobility Overal bed mobility: Needs Assistance Bed Mobility: Supine to Sit;Sit to Supine     Supine to sit: Supervision;HOB elevated Sit to supine: Supervision;HOB elevated   General bed mobility comments: increased effort and time for pt to perform on own  Transfers Overall transfer level: Needs assistance Equipment used: Straight cane Transfers: Sit to/from Stand Sit to Stand: Min assist;Min guard Stand pivot transfers: Min guard;Min assist(assist to steady occasionally)       General transfer comment: pt  with multiple attempts to stand on own from bed (unable on own) but eventually requiring min assist to initiate stand; CGA from [facility] over toilet using grab bar with L UE  Ambulation/Gait Ambulation/Gait assistance: Min guard;Min assist Gait Distance (Feet): 120 Feet Assistive device: Straight cane Gait Pattern/deviations: Step-to pattern Gait velocity: decreased   General Gait Details: initially unsteady requiring CGA to min assist to steady but improved with distance;  pt also with difficulty with gait sequencing and use of SPC but improved with repetition and ambulation distance   Stairs             Wheelchair Mobility    Modified Rankin (Stroke Patients Only)       Balance Overall balance assessment: Needs assistance Sitting-balance support: No upper extremity supported;Feet supported Sitting balance-Leahy Scale: Good Sitting balance - Comments: steady sitting reaching within BOS with L UE   Standing balance support: No upper extremity supported Standing balance-Leahy Scale: Good Standing balance comment: steady static standing reaching within BOS                            Cognition Arousal/Alertness: Awake/alert Behavior During Therapy: WFL for tasks assessed/performed Overall Cognitive Status: Within Functional Limits for tasks assessed                                        Exercises      General Comments  Pt agreeable to PT session.      Pertinent Vitals/Pain Pain Assessment: 0-10 Pain Score: 7 (6-7/10 L hip; 2/10 R shoulder) Pain Location: L hip (  pt reports h/o arthritis and L hip pain) Pain Descriptors / Indicators: Aching;Sore;Discomfort Pain Intervention(s): Limited activity within patient's tolerance;Monitored during session;Repositioned;Patient requesting pain meds-RN notified;Other (comment)(polar care applied and activated)    Home Living                      Prior Function            PT Goals  (current goals can now be found in the care plan section) Acute Rehab PT Goals Patient Stated Goal: get through rehab and return to PLOF  Time For Goal Achievement: 07/12/18 Potential to Achieve Goals: Good Progress towards PT goals: Progressing toward goals    Frequency    BID      PT Plan Current plan remains appropriate    Co-evaluation              AM-PAC PT "6 Clicks" Daily Activity  Outcome Measure  Difficulty turning over in bed (including adjusting bedclothes, sheets and blankets)?: A Lot Difficulty moving from lying on back to sitting on the side of the bed? : A Lot Difficulty sitting down on and standing up from a chair with arms (e.g., wheelchair, bedside commode, etc,.)?: Unable Help needed moving to and from a bed to chair (including a wheelchair)?: A Little Help needed walking in hospital room?: A Little Help needed climbing 3-5 steps with a railing? : A Little 6 Click Score: 14    End of Session Equipment Utilized During Treatment: Gait belt;Other (comment)(shoulder sling) Activity Tolerance: Patient limited by pain Patient left: in bed;with call bell/phone within reach;with bed alarm set;with SCD's reapplied;Other (comment)(B heels elevated via pillow; polar care in place and activated) Nurse Communication: Mobility status;Patient requests pain meds;Precautions;Weight bearing status PT Visit Diagnosis: Unsteadiness on feet (R26.81);Other abnormalities of gait and mobility (R26.89);Muscle weakness (generalized) (M62.81);Pain Pain - Right/Left: Right Pain - part of body: Shoulder     Time: 1003-1035 PT Time Calculation (min) (ACUTE ONLY): 32 min  Charges:  $Gait Training: 8-22 mins $Therapeutic Activity: 8-22 mins                    G CodesLeitha Bleak, PT 06/30/18, 10:56 AM [phone]

## 2018-06-30 NOTE — Clinical Social Work Placement (Signed)
   CLINICAL SOCIAL WORK PLACEMENT  NOTE  Date:  06/30/2018  Patient Details  Name: Tina Howard MRN: [id number] Date of Birth: 1933-08-05  Clinical Social Work is seeking post-discharge placement for this patient at the [place] level of care (*CSW will initial, date and re-position this form in  chart as items are completed):  Yes   Patient/family provided with [facility] Work Department's list of facilities offering this level of care within the geographic area requested by the patient (or if unable, by the patient's family).  Yes   Patient/family informed of their freedom to choose among providers that offer the needed level of care, that participate in Medicare, Medicaid or managed care program needed by the patient, have an available bed and are willing to accept the patient.  Yes   Patient/family informed of [facility]'s ownership interest in [facility] and [facility], as well as of the fact that they are under no obligation to receive care at these facilities.  PASRR submitted to EDS on 06/27/18     PASRR number received on 06/27/18     Existing PASRR number confirmed on       FL2 transmitted to all facilities in geographic area requested by pt/family on 06/27/18     FL2 transmitted to all facilities within larger geographic area on       Patient informed that his/her managed care company has contracts with or will negotiate with certain facilities, including the following:        Yes   Patient/family informed of bed offers received.  Patient chooses bed at [facility] )     Physician recommends and patient chooses bed at      Patient to be transferred to [facility] ) on 06/30/18.  Patient to be transferred to facility by [facility] )     Patient family notified on 06/30/18 of transfer.  Name of family member notified:  (Patient's son [name] is aware of D/C today. )     PHYSICIAN       Additional  Comment:    _______________________________________________ Braylee Lal, Veronia Beets, LCSW 06/30/2018, 9:20 AM

## 2018-07-02 DIAGNOSIS — E876 Hypokalemia: Secondary | ICD-10-CM | POA: Diagnosis not present

## 2018-07-02 DIAGNOSIS — I480 Paroxysmal atrial fibrillation: Secondary | ICD-10-CM | POA: Diagnosis not present

## 2018-07-02 DIAGNOSIS — M150 Primary generalized (osteo)arthritis: Secondary | ICD-10-CM | POA: Diagnosis not present

## 2018-07-03 ENCOUNTER — Other Ambulatory Visit: Payer: Self-pay

## 2018-07-03 MED ORDER — TRAMADOL HCL 50 MG PO TABS: 50.0000 mg | ORAL_TABLET | Freq: Four times a day (QID) | ORAL | 0 refills | Status: DC | PRN

## 2018-07-03 MED ORDER — OXYCODONE HCL 5 MG PO TABS: 5.0000 mg | ORAL_TABLET | ORAL | 0 refills | Status: DC | PRN

## 2018-07-03 NOTE — Telephone Encounter (Signed)
Rx sent to [facility] phone : [phone] , fax : [phone]  

## 2018-07-11 ENCOUNTER — Other Ambulatory Visit
Admission: RE | Admit: 2018-07-11 | Discharge: 2018-07-11 | Disposition: A | Discharge status: Home / Self Care | Payer: No Typology Code available for payment source | Source: Other Acute Inpatient Hospital | Attending: Internal Medicine | Admitting: Internal Medicine

## 2018-07-11 DIAGNOSIS — E871 Hypo-osmolality and hyponatremia: Secondary | ICD-10-CM | POA: Insufficient documentation

## 2018-07-11 LAB — BASIC METABOLIC PANEL
Anion gap: 7 (ref 5–15)
BUN: 10 mg/dL (ref 8–23)
CO2: 33 mmol/L — ABNORMAL HIGH (ref 22–32)
CREATININE: 0.45 mg/dL (ref 0.44–1.00)
Calcium: 8.3 mg/dL — ABNORMAL LOW (ref 8.9–10.3)
Chloride: 97 mmol/L — ABNORMAL LOW (ref 98–111)
GFR calc Af Amer: >60 mL/min (ref >60)
GLUCOSE: 105 mg/dL — AB (ref 70–99)
Potassium: 2.8 mmol/L — ABNORMAL LOW (ref 3.5–5.1)
Sodium: 137 mmol/L (ref 135–145)

## 2018-07-19 DIAGNOSIS — M25511 Pain in right shoulder: Secondary | ICD-10-CM | POA: Diagnosis not present

## 2018-07-19 DIAGNOSIS — M25611 Stiffness of right shoulder, not elsewhere classified: Secondary | ICD-10-CM | POA: Diagnosis not present

## 2018-07-19 DIAGNOSIS — Z96611 Presence of right artificial shoulder joint: Secondary | ICD-10-CM | POA: Diagnosis not present

## 2018-07-19 DIAGNOSIS — R29898 Other symptoms and signs involving the musculoskeletal system: Secondary | ICD-10-CM | POA: Diagnosis not present

## 2018-07-21 ENCOUNTER — Telehealth: Payer: Self-pay

## 2018-07-21 NOTE — Telephone Encounter (Signed)
Copied from [place] [id number]. Topic: General - Other >> Jul 21, 2018  2:01 PM Lennox Solders wrote: Reason for CRM:pt was discharged from [facility]hab  on 07-18-18 ,they are part of [facility] at [place]. Pt had total replacement shoulder surgery . Pt is in a sling. Pt needs help with bathing and dressing. Pt would like md to order nurse aid to help her

## 2018-07-21 NOTE — Telephone Encounter (Signed)
I think this order would need to come from orthopedics b/c they have seen her for this  Or would require face to face visit to order this with me   rec Pt to Call ortho to see if they can order   [place]

## 2018-07-24 ENCOUNTER — Ambulatory Visit: Payer: Self-pay | Admitting: Internal Medicine

## 2018-07-24 NOTE — Telephone Encounter (Signed)
She called in returning call from [facility]. I gave her the message from Dr. Terese Door that she should call her orthopedic surgeon for the order for help at home getting dressed and bathing post shoulder replacement surgery.  She had already called her ortho surgeon who would not order a CNA to come to her house.   She has a son that is staying with her right now so has decided to forgo having someone come in and help her.   She said she is doing good with everything except needs helps getting her shirt on.  I instructed her to call us back if she changed her mind and wanted to see Dr. Aundra Dubin to obtain an order for home assistance.   She agreed.

## 2018-07-24 NOTE — Telephone Encounter (Signed)
Lm on pts vm requesting a call back. Ok to advise pt of PCP comments and advice should she return call

## 2018-07-24 NOTE — Telephone Encounter (Signed)
If she calls [place] back for this this requires a face to face appt to order home health services  FYI   [place]

## 2018-07-24 NOTE — Telephone Encounter (Signed)
FYI

## 2018-07-24 NOTE — Telephone Encounter (Signed)
See additional phone note. 

## 2018-07-25 DIAGNOSIS — Z96611 Presence of right artificial shoulder joint: Secondary | ICD-10-CM | POA: Diagnosis not present

## 2018-07-25 DIAGNOSIS — M25511 Pain in right shoulder: Secondary | ICD-10-CM | POA: Diagnosis not present

## 2018-07-28 DIAGNOSIS — Z96611 Presence of right artificial shoulder joint: Secondary | ICD-10-CM | POA: Diagnosis not present

## 2018-07-28 DIAGNOSIS — M25511 Pain in right shoulder: Secondary | ICD-10-CM | POA: Diagnosis not present

## 2018-08-01 DIAGNOSIS — Z96611 Presence of right artificial shoulder joint: Secondary | ICD-10-CM | POA: Diagnosis not present

## 2018-08-01 DIAGNOSIS — M25511 Pain in right shoulder: Secondary | ICD-10-CM | POA: Diagnosis not present

## 2018-08-04 DIAGNOSIS — M25511 Pain in right shoulder: Secondary | ICD-10-CM | POA: Diagnosis not present

## 2018-08-04 DIAGNOSIS — Z96611 Presence of right artificial shoulder joint: Secondary | ICD-10-CM | POA: Diagnosis not present

## 2018-08-08 DIAGNOSIS — Z96611 Presence of right artificial shoulder joint: Secondary | ICD-10-CM | POA: Diagnosis not present

## 2018-08-09 DIAGNOSIS — I480 Paroxysmal atrial fibrillation: Secondary | ICD-10-CM | POA: Diagnosis not present

## 2018-08-09 DIAGNOSIS — I340 Nonrheumatic mitral (valve) insufficiency: Secondary | ICD-10-CM | POA: Diagnosis not present

## 2018-08-09 DIAGNOSIS — E7849 Other hyperlipidemia: Secondary | ICD-10-CM | POA: Diagnosis not present

## 2018-08-09 DIAGNOSIS — I10 Essential (primary) hypertension: Secondary | ICD-10-CM | POA: Diagnosis not present

## 2018-08-11 DIAGNOSIS — M25511 Pain in right shoulder: Secondary | ICD-10-CM | POA: Diagnosis not present

## 2018-08-11 DIAGNOSIS — Z96611 Presence of right artificial shoulder joint: Secondary | ICD-10-CM | POA: Diagnosis not present

## 2018-08-15 DIAGNOSIS — Z96611 Presence of right artificial shoulder joint: Secondary | ICD-10-CM | POA: Diagnosis not present

## 2018-08-15 DIAGNOSIS — M25511 Pain in right shoulder: Secondary | ICD-10-CM | POA: Diagnosis not present

## 2018-08-18 DIAGNOSIS — M25511 Pain in right shoulder: Secondary | ICD-10-CM | POA: Diagnosis not present

## 2018-08-18 DIAGNOSIS — Z96611 Presence of right artificial shoulder joint: Secondary | ICD-10-CM | POA: Diagnosis not present

## 2018-08-22 ENCOUNTER — Encounter: Payer: Self-pay | Admitting: Podiatry

## 2018-08-22 ENCOUNTER — Ambulatory Visit (INDEPENDENT_AMBULATORY_CARE_PROVIDER_SITE_OTHER): Payer: Medicare Other | Admitting: Podiatry

## 2018-08-22 DIAGNOSIS — L989 Disorder of the skin and subcutaneous tissue, unspecified: Secondary | ICD-10-CM

## 2018-08-22 DIAGNOSIS — Z96611 Presence of right artificial shoulder joint: Secondary | ICD-10-CM | POA: Diagnosis not present

## 2018-08-22 DIAGNOSIS — B351 Tinea unguium: Secondary | ICD-10-CM | POA: Diagnosis not present

## 2018-08-22 DIAGNOSIS — M79609 Pain in unspecified limb: Secondary | ICD-10-CM

## 2018-08-22 DIAGNOSIS — M25511 Pain in right shoulder: Secondary | ICD-10-CM | POA: Diagnosis not present

## 2018-08-23 ENCOUNTER — Telehealth: Payer: Self-pay | Admitting: Internal Medicine

## 2018-08-23 NOTE — Telephone Encounter (Signed)
Review for refill that was prescribed by a historical provider.  Losartan 100 mg  Refill Last Refill:10/31/17 # ? Last OV: 06/19/18 PCP: Sondra Barges, MD Pharmacy:[facility] N[facility]  08/29/18

## 2018-08-23 NOTE — Telephone Encounter (Signed)
Copied from [place] [phone]. Topic: Quick Communication - See Telephone Encounter >> Aug 23, 2018  9:28 AM Ahmed Prima L wrote: CRM for notification. See Telephone encounter for: 08/23/18.  Patient states that she needs a new script on her losartan (COZAAR) 100 MG tablet per [facility]. Please advise.

## 2018-08-24 NOTE — Progress Notes (Signed)
    Subjective: Patient is a 84 y.o. female presenting to the office today with a chief complaint of painful callus lesions to the bilateral feet that have been present for several months. Bearing weight increases the pain. She has not done anything at home for treatment.   Patient also complains of elongated, thickened nails that cause pain while ambulating in shoes. She is unable to trim her own nails. Patient presents today for further treatment and evaluation.  Past Medical History:  Diagnosis Date  . Allergic rhinitis   . Arthritis   . CHF (congestive heart failure) ([place]) 04/2016   developed 2 weeks after identifying AF  . Complication of anesthesia   . Dysrhythmia 04/2016   a fib    . GERD (gastroesophageal reflux disease)    occasionally has reflux/heartburn  . History of pelvic mass    s/p resection, benign  . Hypercholesteremia   . Hypertension   . Mitral regurgitation    noted echo 04/2016   . Osteoporosis    per pt report on Medical Screening Form   . PONV (postoperative nausea and vomiting)   . Pyelonephritis    remote hx   . Shortness of breath dyspnea   . Varicella zoster 2007    Objective:  Physical Exam General: Alert and oriented x3 in no acute distress  Dermatology: Hyperkeratotic lesion present on the bilateral feet x 2. Pain on palpation with a central nucleated core noted. Skin is warm, dry and supple bilateral lower extremities. Negative for open lesions or macerations. Nails are tender, long, thickened and dystrophic with subungual debris, consistent with onychomycosis, 1-5 bilateral. No signs of infection noted.  Vascular: Palpable pedal pulses bilaterally. No edema or erythema noted. Capillary refill within normal limits.  Neurological: Epicritic and protective threshold grossly intact bilaterally.   Musculoskeletal Exam: Pain on palpation at the keratotic lesion noted. Range of motion within normal limits bilateral. Muscle strength 5/5 in all groups  bilateral.  Assessment: 1. Onychodystrophic nails 1-5 bilateral with hyperkeratosis of nails.  2. Onychomycosis of nail due to dermatophyte bilateral 3. Pre-ulcerative callus lesion noted to the bilateral feet x 2   Plan of Care:  1. Patient evaluated. 2. Excisional debridement of keratoic lesion using a chisel blade was performed without incident.  3. Dressed with light dressing. 4. Mechanical debridement of nails 1-5 bilaterally performed using a nail nipper. Filed with dremel without incident.  5. Patient is to return to the clinic in 3 months.   Edrick Kins, DPM [facility]  Dr. Edrick Kins, [address]                                        [place], [place] [address]                Office [phone]  Fax [phone]

## 2018-08-25 DIAGNOSIS — M25511 Pain in right shoulder: Secondary | ICD-10-CM | POA: Diagnosis not present

## 2018-08-25 DIAGNOSIS — Z96611 Presence of right artificial shoulder joint: Secondary | ICD-10-CM | POA: Diagnosis not present

## 2018-08-26 ENCOUNTER — Emergency Department: Payer: Medicare Other

## 2018-08-26 ENCOUNTER — Emergency Department
Admission: EM | Admit: 2018-08-26 | Discharge: 2018-08-26 | Disposition: A | Discharge status: Home / Self Care | Payer: Medicare Other | Attending: Emergency Medicine | Admitting: Emergency Medicine

## 2018-08-26 DIAGNOSIS — W19XXXA Unspecified fall, initial encounter: Secondary | ICD-10-CM | POA: Diagnosis not present

## 2018-08-26 DIAGNOSIS — S0083XA Contusion of other part of head, initial encounter: Secondary | ICD-10-CM | POA: Insufficient documentation

## 2018-08-26 DIAGNOSIS — I509 Heart failure, unspecified: Secondary | ICD-10-CM | POA: Insufficient documentation

## 2018-08-26 DIAGNOSIS — R51 Headache: Secondary | ICD-10-CM | POA: Diagnosis not present

## 2018-08-26 DIAGNOSIS — R519 Headache, unspecified: Secondary | ICD-10-CM

## 2018-08-26 DIAGNOSIS — M25511 Pain in right shoulder: Secondary | ICD-10-CM | POA: Insufficient documentation

## 2018-08-26 DIAGNOSIS — W51XXXA Accidental striking against or bumped into by another person, initial encounter: Secondary | ICD-10-CM | POA: Diagnosis not present

## 2018-08-26 DIAGNOSIS — Z79899 Other long term (current) drug therapy: Secondary | ICD-10-CM | POA: Insufficient documentation

## 2018-08-26 DIAGNOSIS — Y999 Unspecified external cause status: Secondary | ICD-10-CM | POA: Diagnosis not present

## 2018-08-26 DIAGNOSIS — Z7901 Long term (current) use of anticoagulants: Secondary | ICD-10-CM | POA: Diagnosis not present

## 2018-08-26 DIAGNOSIS — S0990XA Unspecified injury of head, initial encounter: Secondary | ICD-10-CM | POA: Diagnosis not present

## 2018-08-26 DIAGNOSIS — I110 Hypertensive heart disease with heart failure: Secondary | ICD-10-CM | POA: Insufficient documentation

## 2018-08-26 DIAGNOSIS — Y9389 Activity, other specified: Secondary | ICD-10-CM | POA: Diagnosis not present

## 2018-08-26 DIAGNOSIS — S4991XA Unspecified injury of right shoulder and upper arm, initial encounter: Secondary | ICD-10-CM | POA: Diagnosis not present

## 2018-08-26 DIAGNOSIS — S098XXA Other specified injuries of head, initial encounter: Secondary | ICD-10-CM | POA: Diagnosis present

## 2018-08-26 DIAGNOSIS — Z96653 Presence of artificial knee joint, bilateral: Secondary | ICD-10-CM | POA: Insufficient documentation

## 2018-08-26 DIAGNOSIS — Y9289 Other specified places as the place of occurrence of the external cause: Secondary | ICD-10-CM | POA: Diagnosis not present

## 2018-08-26 MED ORDER — TRAMADOL HCL 50 MG PO TABS
50.0000 mg | ORAL_TABLET | Freq: Once | ORAL | Status: AC
  Administered 2018-08-26: 50 mg via ORAL
  Filled 2018-08-26: qty 1

## 2018-08-26 NOTE — ED Triage Notes (Signed)
Pt at [facility] arrives via [facility] Pt  fell and has a goose egg back side of left head, shoulder replacement July 2019 Pain there in the shoulder. No LOC no n/v/d pt is on xarelto  152/94 98

## 2018-08-26 NOTE — Discharge Instructions (Signed)
Fortunately today your CT scan and the x-ray of your shoulder were reassuring.  Please take your pain medication at home as prescribed and follow-up with your primary care physician as needed.  It was a pleasure to take care of you today, and thank you for coming to our emergency department.  If you have any questions or concerns before leaving please ask the nurse to grab me and I'm more than happy to go through your aftercare instructions again.  If you were prescribed any opioid pain medication today such as Norco, Vicodin, Percocet, morphine, hydrocodone, or oxycodone please make sure you do not drive when you are taking this medication as it can alter your ability to drive safely.  If you have any concerns once you are home that you are not improving or are in fact getting worse before you can make it to your follow-up appointment, please do not hesitate to call 911 and come back for further evaluation.  Darel Hong, MD  Results for orders placed or performed during the hospital encounter of 25/05/39  Basic metabolic panel  Result Value Ref Range   Sodium 137 135 - 145 mmol/L   Potassium 2.8 (L) 3.5 - 5.1 mmol/L   Chloride 97 (L) 98 - 111 mmol/L   CO2 33 (H) 22 - 32 mmol/L   Glucose, Bld 105 (H) 70 - 99 mg/dL   BUN 10 8 - 23 mg/dL   Creatinine, Ser 0.45 0.44 - 1.00 mg/dL   Calcium 8.3 (L) 8.9 - 10.3 mg/dL   GFR calc non Af Amer >60 >60 mL/min   GFR calc Af Amer >60 >60 mL/min   Anion gap 7 5 - 15   Dg Shoulder Right  Result Date: 08/26/2018 CLINICAL DATA:  84 year old female with right shoulder pain after falling at church EXAM: RIGHT SHOULDER - 2+ VIEW COMPARISON:  Prior radiographs 06/27/2018 FINDINGS: Surgical changes of prior reverse shoulder arthroplasty. No evidence of periprosthetic fracture or complication. The shoulder is located. Mild degenerative osteoarthritis present at the acromioclavicular joint. IMPRESSION: Reverse shoulder arthroplasty without evidence of  complication. Electronically Signed   By: Jacqulynn Cadet M.D.   On: 08/26/2018 11:42   Ct Head Wo Contrast  Result Date: 08/26/2018 CLINICAL DATA:  Fall today. Patient hit the back of her head. No loss of consciousness. EXAM: CT HEAD WITHOUT CONTRAST TECHNIQUE: Contiguous axial images were obtained from the base of the skull through the vertex without intravenous contrast. COMPARISON:  None. FINDINGS: Brain: No evidence of acute infarction, hemorrhage, hydrocephalus, extra-axial collection or mass lesion/mass effect. Patchy white matter hypoattenuation is noted consistent with moderate chronic microvascular ischemic change. Vascular: No hyperdense vessel or unexpected calcification. Skull: Normal. Negative for fracture or focal lesion. Sinuses/Orbits: Visualized globes and orbits are unremarkable. The visualized sinuses and mastoid air cells are clear. Other: None. IMPRESSION: 1. No acute intracranial abnormalities.  No skull fracture. 2. Moderate chronic microvascular ischemic change. Electronically Signed   By: Lajean Manes M.D.   On: 08/26/2018 11:47

## 2018-08-26 NOTE — ED Provider Notes (Signed)
[facility] Emergency Department Provider Note  ____________________________________________   First MD Initiated Contact with Patient 08/26/18 1113     (approximate)  I have reviewed the triage vital signs and the nursing notes.   HISTORY  Chief Complaint Fall   HPI Tina Howard is a 84 y.o. female comes to the emergency department by EMS after a mechanical fall shortly prior to arrival.  The patient was at a funeral walking with her walker when she bumped into another person and fell on to her back.  She struck the left side of her head.  She did not lose consciousness however she is concerned because she takes rivaroxaban.  She also reports right shoulder pain and she had a recent shoulder surgery and is in a shoulder immobilizer.  Her symptoms began suddenly were moderate severity they are currently constant they are worse with movement improved with rest.  No double vision or blurred vision.  No chest pain or shortness of breath.  No abdominal pain nausea or vomiting.  No numbness or weakness.  She denies alcohol consumption.    Past Medical History:  Diagnosis Date  . Allergic rhinitis   . Arthritis   . CHF (congestive heart failure) ([place]) 04/2016   developed 2 weeks after identifying AF  . Complication of anesthesia   . Dysrhythmia 04/2016   a fib    . GERD (gastroesophageal reflux disease)    occasionally has reflux/heartburn  . History of pelvic mass    s/p resection, benign  . Hypercholesteremia   . Hypertension   . Mitral regurgitation    noted echo 04/2016   . Osteoporosis    per pt report on Medical Screening Form   . PONV (postoperative nausea and vomiting)   . Pyelonephritis    remote hx   . Shortness of breath dyspnea   . Varicella zoster 2007    Patient Active Problem List   Diagnosis Date Noted  . 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 Hoarseness 03/06/2018  . HLD (hyperlipidemia) 12/29/2017    . SOB (shortness of breath) 12/29/2017  . Cardiac murmur 12/29/2017  . Mitral regurgitation 12/29/2017  . Osteopenia 12/01/2017  . Fall at home, subsequent encounter 08/02/2017  . First degree AV block 08/02/2017  . Paroxysmal A-fib ([place]) 05/26/2016  . Essential hypertension 05/26/2016  . Hypokalemia 05/26/2016  . Seasonal allergies 05/06/2016  . Obesity (BMI 30-39.9) 11/13/2014  . Back pain, chronic 05/08/2014  . Prediabetes 05/08/2014  . Screening for breast cancer 06/27/2012  . 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 Osteoarthrosis 08/20/2011    Past Surgical History:  Procedure Laterality Date  . Poth   lower back  . bladder prolapse repair    . BREAST CYST EXCISION Left    1990s  . CATARACT EXTRACTION Bilateral 2014   Dr. Sandra Cockayne  . ELECTROPHYSIOLOGIC STUDY N/A 06/15/2016   Procedure: CARDIOVERSION;  Surgeon: [name], MD;  Location: [facility] ORS;  Service: Cardiovascular;  Laterality: N/A;  . EYE SURGERY     bilateral cataract  . FOOT SURGERY    . HERNIA REPAIR  2013   inguinal  . JOINT REPLACEMENT  [id number]   bilateral knee replacements  . KNEE ARTHROSCOPY     right  . RECTOCELE REPAIR    . REVERSE SHOULDER ARTHROPLASTY Right 06/27/2018   Procedure: REVERSE SHOULDER ARTHROPLASTY;  Surgeon: Corky Mull, MD;  Location: [facility] ORS;  Service: Orthopedics;  Laterality:  Right;  Marland Kitchen TOTAL KNEE ARTHROPLASTY     left  . VAGINAL HYSTERECTOMY  1970   benign reasons    Prior to Admission medications   Medication Sig Start Date End Date Taking? Authorizing Provider  acetaminophen (TYLENOL) 500 MG tablet Take 500-1,000 mg by mouth 2 (two) times daily as needed for moderate pain or headache.    [provider]  amoxicillin (AMOXIL) 500 MG capsule Take 2,000 mg by mouth See admin instructions. Take 2000 mg by mouth 1 hour prior to dental work    [provider]  calcium carbonate (TUMS - DOSED IN MG ELEMENTAL  CALCIUM) 500 MG chewable tablet Chew 1 tablet by mouth daily as needed for indigestion or heartburn.    [provider]  Cholecalciferol (VITAMIN D3) 2000 units capsule Take 1,000 Units by mouth daily.     [provider]  cyclobenzaprine (FLEXERIL) 5 MG tablet Take 5 mg by mouth 2 (two) times daily.    [provider]  diclofenac sodium (VOLTAREN) 1 % GEL Apply 1 application topically as needed (foot pain).     [provider]  diltiazem (TIAZAC) 120 MG 24 hr capsule Take 120 mg by mouth once daily at night 10/10/17   [provider]  docusate calcium (SURFAK) 240 MG capsule Take 240 mg by mouth at bedtime.     [provider]  flecainide (TAMBOCOR) 50 MG tablet Take 1 tablet (50 mg total) by mouth 2 (two) times daily. 06/15/16   [name], MD  furosemide (LASIX) 40 MG tablet Take 40 mg by mouth daily.  07/25/17   [provider]  hydrALAZINE (APRESOLINE) 25 MG tablet Take 1 tablet (25 mg total) by mouth 3 (three) times daily. Patient taking differently: Take 25 mg by mouth 2 (two) times daily.  12/08/17 12/08/18  McLean-Scocuzza, Nino Glow, MD  loratadine (CLARITIN) 10 MG tablet Take 10 mg by mouth daily as needed for allergies.    [provider]  losartan (COZAAR) 100 MG tablet Take 100 mg by mouth daily.  07/25/17   [provider]  ondansetron (ZOFRAN) 4 MG tablet Take 1 tablet (4 mg total) by mouth every 6 (six) hours as needed for nausea. 06/29/18   Lattie Corns, PA-C  oxyCODONE (OXY IR/ROXICODONE) 5 MG immediate release tablet Take 1-2 tablets (5-10 mg total) by mouth every 4 (four) hours as needed for moderate pain. 07/03/18   Gerlene Fee, NP  potassium chloride (K-DUR,KLOR-CON) 10 MEQ tablet Take 2 tablets (20 mEq total) by mouth 2 (two) times daily for 5 days. Was taking 3 tablets every am x 4 days 06/30/18 07/05/18  Lattie Corns, PA-C  pravastatin (PRAVACHOL) 20 MG tablet Take 1 tablet (20 mg  total) by mouth daily. Patient taking differently: Take 20 mg by mouth every evening.  12/08/17   McLean-Scocuzza, Nino Glow, MD  rivaroxaban (XARELTO) 20 MG TABS tablet Take 1 tablet (20 mg total) by mouth daily with supper. 03/06/18   Burnard Hawthorne, FNP  scopolamine (TRANSDERM-SCOP) 1 MG/3DAYS Place 1 patch (1.5 mg total) onto the skin every 3 (three) days. 07/01/18   Lattie Corns, PA-C  timolol (TIMOPTIC) 0.5 % ophthalmic solution Place 1 drop into both eyes daily.     [provider]  traMADol (ULTRAM) 50 MG tablet Take 1-2 tablets (50-100 mg total) by mouth every 6 (six) hours as needed for moderate pain. 07/03/18   Gerlene Fee, NP    Allergies  Amlodipine; Azo urinary tract support; and Erythromycin  Family History  Problem Relation Age of Onset  . Emphysema Mother   . Heart disease Father   . Breast cancer Maternal Aunt 80    Social History Social History   Tobacco Use  . Smoking status: Never Smoker  . Smokeless tobacco: Never Used  Substance Use Topics  . Alcohol use: Yes    Alcohol/week: 2.0 standard drinks    Types: 2 Glasses of wine per week    Comment: occassional  . Drug use: No    Review of Systems Constitutional: No fever/chills Eyes: No visual changes. ENT: No sore throat. Cardiovascular: Denies chest pain. Respiratory: Denies shortness of breath. Gastrointestinal: No abdominal pain.  No nausea, no vomiting.  No diarrhea.  No constipation. Genitourinary: Negative for dysuria. Musculoskeletal: Positive for shoulder pain Skin: Negative for rash. Neurological: Positive for headache   ____________________________________________   PHYSICAL EXAM:  VITAL SIGNS: ED Triage Vitals  Enc Vitals Group     BP --      Pulse Rate 08/26/18 1110 79     Resp 08/26/18 1110 18     Temp 08/26/18 1112 98.5 F (36.9 C)     Temp Source 08/26/18 1112 Oral     SpO2 08/26/18 1110 98 %     Weight 08/26/18 1111 200 lb (90.7 kg)     Height 08/26/18  1111 5\' 2"  (1.575 m)     Head Circumference --      Peak Flow --      Pain Score 08/26/18 1110 3     Pain Loc --      Pain Edu? --      Excl. in [place]? --     Constitutional: Alert and oriented x4 tearful although nontoxic no diaphoresis Eyes: PERRL EOMI. midrange and brisk Head: Hematoma to left occiput with no laceration. Nose: No congestion/rhinnorhea. Mouth/Throat: No trismus Neck: No stridor.  No midline tenderness Cardiovascular: Normal rate, regular rhythm. Grossly normal heart sounds.  Good peripheral circulation. Respiratory: Normal respiratory effort.  No retractions. Lungs CTAB and moving good air Gastrointestinal: Soft nontender Musculoskeletal: Right shoulder generally tender although no bony tenderness.  Neurovascularly intact Neurologic:  Normal speech and language. No gross focal neurologic deficits are appreciated. Skin:  Skin is warm, dry and intact. No rash noted. Psychiatric: Mood and affect are normal. Speech and behavior are normal.    ____________________________________________   DIFFERENTIAL includes but not limited to  Mechanical fall, intracerebral hemorrhage, shoulder dislocation, cervical spine fracture ____________________________________________   LABS (all labs ordered are listed, but only abnormal results are displayed)  Labs Reviewed - No data to display   __________________________________________  EKG   ____________________________________________  RADIOLOGY  CT scan of the head reviewed by me with no acute disease X-ray of the right shoulder reviewed by me with no acute disease ____________________________________________   PROCEDURES  Procedure(s) performed: no  Procedures  Critical Care performed: no  ____________________________________________   INITIAL IMPRESSION / ASSESSMENT AND PLAN / ED COURSE  Pertinent labs & imaging results that were available during my care of the patient were reviewed by me and considered  in my medical decision making (see chart for details).   As part of my medical decision making, I reviewed the following data within the [place] History obtained from family if available, nursing notes, old chart and ekg, as well as notes from prior ED visits.  Patient arrives after a traumatic fall with a hematoma on  anticoagulation.  Neurovascularly intact.  She requests only tramadol for pain control.  CT scan of the head and shoulder x-ray are reassuring.  She remains neuro intact.  Discharged home in stable condition.      ____________________________________________   FINAL CLINICAL IMPRESSION(S) / ED DIAGNOSES  Final diagnoses:  Nonintractable headache, unspecified chronicity pattern, unspecified headache type  Accident due to mechanical fall without injury, initial encounter      NEW MEDICATIONS STARTED DURING THIS VISIT:  Discharge Medication List as of 08/26/2018 12:01 PM       Note:  This document was prepared using Dragon voice recognition software and may include unintentional dictation errors.     Darel Hong, MD 08/26/18 2035

## 2018-08-29 ENCOUNTER — Ambulatory Visit (INDEPENDENT_AMBULATORY_CARE_PROVIDER_SITE_OTHER): Payer: Medicare Other | Admitting: Internal Medicine

## 2018-08-29 ENCOUNTER — Encounter: Payer: Self-pay | Admitting: Internal Medicine

## 2018-08-29 VITALS — BP 142/64 | HR 79 | Temp 98.2°F | Ht 62.0 in | Wt 188.2 lb

## 2018-08-29 DIAGNOSIS — D72829 Elevated white blood cell count, unspecified: Secondary | ICD-10-CM

## 2018-08-29 DIAGNOSIS — Z1231 Encounter for screening mammogram for malignant neoplasm of breast: Secondary | ICD-10-CM

## 2018-08-29 DIAGNOSIS — E876 Hypokalemia: Secondary | ICD-10-CM

## 2018-08-29 DIAGNOSIS — R7303 Prediabetes: Secondary | ICD-10-CM | POA: Diagnosis not present

## 2018-08-29 DIAGNOSIS — I10 Essential (primary) hypertension: Secondary | ICD-10-CM | POA: Diagnosis not present

## 2018-08-29 DIAGNOSIS — S4991XA Unspecified injury of right shoulder and upper arm, initial encounter: Secondary | ICD-10-CM | POA: Diagnosis not present

## 2018-08-29 DIAGNOSIS — Z23 Encounter for immunization: Secondary | ICD-10-CM | POA: Diagnosis not present

## 2018-08-29 DIAGNOSIS — E785 Hyperlipidemia, unspecified: Secondary | ICD-10-CM

## 2018-08-29 DIAGNOSIS — W19XXXD Unspecified fall, subsequent encounter: Secondary | ICD-10-CM

## 2018-08-29 LAB — COMPREHENSIVE METABOLIC PANEL
ALK PHOS: 58 U/L (ref 39–117)
ALT: 11 U/L (ref 0–35)
AST: 14 U/L (ref 0–37)
Albumin: 4.1 g/dL (ref 3.5–5.2)
BUN: 13 mg/dL (ref 6–23)
CHLORIDE: 102 meq/L (ref 96–112)
CO2: 30 mEq/L (ref 19–32)
Calcium: 9 mg/dL (ref 8.4–10.5)
Creatinine, Ser: 0.54 mg/dL (ref 0.40–1.20)
GFR: 114.52 mL/min (ref >60.00)
GLUCOSE: 112 mg/dL — AB (ref 70–99)
POTASSIUM: 3.6 meq/L (ref 3.5–5.1)
SODIUM: 141 meq/L (ref 135–145)
TOTAL PROTEIN: 6.8 g/dL (ref 6.0–8.3)
Total Bilirubin: 0.4 mg/dL (ref 0.2–1.2)

## 2018-08-29 LAB — CBC WITH DIFFERENTIAL/PLATELET
BASOS ABS: 0 10*3/uL (ref 0.0–0.1)
Basophils Relative: 0.4 % (ref 0.0–3.0)
EOS ABS: 0.2 10*3/uL (ref 0.0–0.7)
Eosinophils Relative: 2.8 % (ref 0.0–5.0)
HEMATOCRIT: 36.3 % (ref 36.0–46.0)
HEMOGLOBIN: 12 g/dL (ref 12.0–15.0)
LYMPHS PCT: 32.2 % (ref 12.0–46.0)
Lymphs Abs: 2.2 10*3/uL (ref 0.7–4.0)
MCHC: 33 g/dL (ref 30.0–36.0)
MCV: 81.3 fl (ref 78.0–100.0)
MONOS PCT: 8.4 % (ref 3.0–12.0)
Monocytes Absolute: 0.6 10*3/uL (ref 0.1–1.0)
NEUTROS ABS: 3.8 10*3/uL (ref 1.4–7.7)
Neutrophils Relative %: 56.2 % (ref 43.0–77.0)
PLATELETS: 272 10*3/uL (ref 150.0–400.0)
RBC: 4.46 Mil/uL (ref 3.87–5.11)
RDW: 14.8 % (ref 11.5–15.5)
WBC: 6.7 10*3/uL (ref 4.0–10.5)

## 2018-08-29 LAB — LIPID PANEL
CHOLESTEROL: 183 mg/dL (ref 0–200)
HDL: 57.2 mg/dL (ref >39.00)
LDL CALC: 102 mg/dL — AB (ref 0–99)
NonHDL: 126.28
TRIGLYCERIDES: 123 mg/dL (ref 0.0–149.0)
Total CHOL/HDL Ratio: 3
VLDL: 24.6 mg/dL (ref 0.0–40.0)

## 2018-08-29 LAB — MAGNESIUM: Magnesium: 2.3 mg/dL (ref 1.5–2.5)

## 2018-08-29 LAB — HEMOGLOBIN A1C: HEMOGLOBIN A1C: 5.7 % (ref 4.6–6.5)

## 2018-08-29 MED ORDER — LOSARTAN POTASSIUM 100 MG PO TABS: 100.0000 mg | ORAL_TABLET | Freq: Every day | ORAL | 3 refills | Status: DC

## 2018-08-29 MED ORDER — LOSARTAN POTASSIUM 100 MG PO TABS: 100.0000 mg | ORAL_TABLET | Freq: Every day | ORAL | 0 refills | Status: DC

## 2018-08-29 NOTE — Patient Instructions (Addendum)
Flu shot today  Follow up in 3 months  Please schedule mammogram We will give you pneumonia vaccine at follow up     Hypokalemia Hypokalemia means that the amount of potassium in the blood is lower than normal.Potassium is a chemical that helps regulate the amount of fluid in the body (electrolyte). It also stimulates muscle tightening (contraction) and helps nerves work properly.Normally, most of the body's potassium is inside of cells, and only a very small amount is in the blood. Because the amount in the blood is so small, minor changes to potassium levels in the blood can be life-threatening. What are the causes? This condition may be caused by:  Antibiotic medicine.  Diarrhea or vomiting. Taking too much of a medicine that helps you have a bowel movement (laxative) can cause diarrhea and lead to hypokalemia.  Chronic kidney disease (CKD).  Medicines that help the body get rid of excess fluid (diuretics).  Eating disorders, such as bulimia.  Low magnesium levels in the body.  Sweating a lot.  What are the signs or symptoms? Symptoms of this condition include:  Weakness.  Constipation.  Fatigue.  Muscle cramps.  Mental confusion.  Skipped heartbeats or irregular heartbeat (palpitations).  Tingling or numbness.  How is this diagnosed? This condition is diagnosed with a blood test. How is this treated? Hypokalemia can be treated by taking potassium supplements by mouth or adjusting the medicines that you take. Treatment may also include eating more foods that contain a lot of potassium. If your potassium level is very low, you may need to get potassium through an IV tube in one of your veins and be monitored in the hospital. Follow these instructions at home:  Take over-the-counter and prescription medicines only as told by your health care provider. This includes vitamins and supplements.  Eat a healthy diet. A healthy diet includes fresh fruits and vegetables,  whole grains, healthy fats, and lean proteins.  If instructed, eat more foods that contain a lot of potassium, such as: ? Nuts, such as peanuts and pistachios. ? Seeds, such as sunflower seeds and pumpkin seeds. ? Peas, lentils, and lima beans. ? Whole grain and bran cereals and breads. ? Fresh fruits and vegetables, such as apricots, avocado, bananas, cantaloupe, kiwi, oranges, tomatoes, asparagus, and potatoes. ? Orange juice. ? Tomato juice. ? Red meats. ? Yogurt.  Keep all follow-up visits as told by your health care provider. This is important. Contact a health care provider if:  You have weakness that gets worse.  You feel your heart pounding or racing.  You vomit.  You have diarrhea.  You have diabetes (diabetes mellitus) and you have trouble keeping your blood sugar (glucose) in your target range. Get help right away if:  You have chest pain.  You have shortness of breath.  You have vomiting or diarrhea that lasts for more than 2 days.  You faint. This information is not intended to replace advice given to you by your health care provider. Make sure you discuss any questions you have with your health care provider. Document Released: 12/13/2005 Document Revised: 07/31/2016 Document Reviewed: 07/31/2016 Elsevier Interactive Patient Education  2018 [employer].

## 2018-08-29 NOTE — Progress Notes (Signed)
Pre visit review using our clinic review tool, if applicable. No additional management support is needed unless otherwise documented below in the visit note. 

## 2018-08-29 NOTE — Progress Notes (Signed)
Chief Complaint  Patient presents with  . Follow-up   F/u  1. Fall 08/26/18 did not pass out mechanical fall CT head neg and Xray shoulder no complications  2. S/p right shoulder surgery Dr. Roland Rack doing PT 2x per weeek  3. HTN sl elevated today on losartan 100 mg qd though out x 4 days, lasix 40 mg qd, dilt 120 24 hr, hydralazine 25 bid   Review of Systems  Constitutional: Negative for weight loss.  HENT: Negative for hearing loss.   Respiratory: Negative for shortness of breath.   Cardiovascular: Negative for chest pain.  Gastrointestinal: Negative for abdominal pain.  Musculoskeletal: Positive for falls and joint pain.  Skin: Negative for rash.  Neurological: Negative for loss of consciousness.  Psychiatric/Behavioral: Negative for depression.   Past Medical History:  Diagnosis Date  . Allergic rhinitis   . Arthritis   . CHF (congestive heart failure) ([place]) 04/2016   developed 2 weeks after identifying AF  . Complication of anesthesia   . Dysrhythmia 04/2016   a fib    . GERD (gastroesophageal reflux disease)    occasionally has reflux/heartburn  . History of pelvic mass    s/p resection, benign  . Hypercholesteremia   . Hypertension   . Mitral regurgitation    noted echo 04/2016   . Osteoporosis    per pt report on Medical Screening Form   . PONV (postoperative nausea and vomiting)   . Pyelonephritis    remote hx   . Shortness of breath dyspnea   . Varicella zoster 2007   Past Surgical History:  Procedure Laterality Date  . [place]   lower back  . bladder prolapse repair    . BREAST CYST EXCISION Left    1990s  . CATARACT EXTRACTION Bilateral 2014   Dr. Sandra Cockayne  . ELECTROPHYSIOLOGIC STUDY N/A 06/15/2016   Procedure: CARDIOVERSION;  Surgeon: [name], MD;  Location: [facility] ORS;  Service: Cardiovascular;  Laterality: N/A;  . EYE SURGERY     bilateral cataract  . FOOT SURGERY    . HERNIA REPAIR  2013   inguinal  . JOINT REPLACEMENT   [id number]   bilateral knee replacements  . KNEE ARTHROSCOPY     right  . RECTOCELE REPAIR    . REVERSE SHOULDER ARTHROPLASTY Right 06/27/2018   Procedure: REVERSE SHOULDER ARTHROPLASTY;  Surgeon: Corky Mull, MD;  Location: [facility] ORS;  Service: Orthopedics;  Laterality: Right;  . TOTAL KNEE ARTHROPLASTY     left  . VAGINAL HYSTERECTOMY  1970   benign reasons   Family History  Problem Relation Age of Onset  . Emphysema Mother   . Heart disease Father   . Breast cancer Maternal Aunt 80   Social History   Socioeconomic History  . Marital status: Single    Spouse name: Not on file  . Number of children: Not on file  . Years of education: Not on file  . Highest education level: Not on file  Occupational History  . Not on file  Social Needs  . Financial resource strain: Not on file  . Food insecurity:    Worry: Not on file    Inability: Not on file  . Transportation needs:    Medical: Not on file    Non-medical: Not on file  Tobacco Use  . Smoking status: Never Smoker  . Smokeless tobacco: Never Used  Substance and Sexual Activity  . Alcohol use: Yes    Alcohol/week: 2.0 standard  drinks    Types: 2 Glasses of wine per week    Comment: occassional  . Drug use: No  . Sexual activity: Never  Lifestyle  . Physical activity:    Days per week: Not on file    Minutes per session: Not on file  . Stress: Not on file  Relationships  . Social connections:    Talks on phone: Not on file    Gets together: Not on file    Attends religious service: Not on file    Active member of club or organization: Not on file    Attends meetings of clubs or organizations: Not on file    Relationship status: Not on file  . Intimate partner violence:    Fear of current or ex partner: Not on file    Emotionally abused: Not on file    Physically abused: Not on file    Forced sexual activity: Not on file  Other Topics Concern  . Not on file  Social History Narrative   Widowed.   Very  active within her community.   Enjoys singing, being active in her church, spending time with family.   Current Meds  Medication Sig  . acetaminophen (TYLENOL) 500 MG tablet Take 500-1,000 mg by mouth 2 (two) times daily as needed for moderate pain or headache.  . calcium carbonate (TUMS - DOSED IN MG ELEMENTAL CALCIUM) 500 MG chewable tablet Chew 1 tablet by mouth daily as needed for indigestion or heartburn.  . Cholecalciferol (VITAMIN D3) 2000 units capsule Take 1,000 Units by mouth daily.   . cyclobenzaprine (FLEXERIL) 5 MG tablet Take 5 mg by mouth 2 (two) times daily.  . diclofenac sodium (VOLTAREN) 1 % GEL Apply 1 application topically as needed (foot pain).   Marland Kitchen diltiazem (TIAZAC) 120 MG 24 hr capsule Take 120 mg by mouth once daily at night  . docusate calcium (SURFAK) 240 MG capsule Take 240 mg by mouth at bedtime.   . flecainide (TAMBOCOR) 50 MG tablet Take 1 tablet (50 mg total) by mouth 2 (two) times daily.  . furosemide (LASIX) 40 MG tablet Take 40 mg by mouth daily.   . hydrALAZINE (APRESOLINE) 25 MG tablet Take 1 tablet (25 mg total) by mouth 3 (three) times daily. (Patient taking differently: Take 25 mg by mouth 2 (two) times daily. )  . loratadine (CLARITIN) 10 MG tablet Take 10 mg by mouth daily as needed for allergies.  Marland Kitchen losartan (COZAAR) 100 MG tablet Take 1 tablet (100 mg total) by mouth daily.  . ondansetron (ZOFRAN) 4 MG tablet Take 1 tablet (4 mg total) by mouth every 6 (six) hours as needed for nausea.  Marland Kitchen oxyCODONE (OXY IR/ROXICODONE) 5 MG immediate release tablet Take 1-2 tablets (5-10 mg total) by mouth every 4 (four) hours as needed for moderate pain.  . pravastatin (PRAVACHOL) 20 MG tablet Take 1 tablet (20 mg total) by mouth daily. (Patient taking differently: Take 20 mg by mouth every evening. )  . rivaroxaban (XARELTO) 20 MG TABS tablet Take 1 tablet (20 mg total) by mouth daily with supper.  Marland Kitchen scopolamine (TRANSDERM-SCOP) 1 MG/3DAYS Place 1 patch (1.5 mg total)  onto the skin every 3 (three) days.  Marland Kitchen timolol (TIMOPTIC) 0.5 % ophthalmic solution Place 1 drop into both eyes daily.   . traMADol (ULTRAM) 50 MG tablet Take 1-2 tablets (50-100 mg total) by mouth every 6 (six) hours as needed for moderate pain.  . [DISCONTINUED] losartan (COZAAR) 100 MG tablet  Take 100 mg by mouth daily.    Allergies  Allergen Reactions  . Amlodipine Swelling    Ankle swelling  . Azo Urinary Tract Support Other (See Comments)    Skin turned orange  . Erythromycin Nausea And Vomiting   Recent Results (from the past 2160 hour(s))  Urine Culture     Status: None   Collection Time: 06/19/18 12:04 PM  Result Value Ref Range   Urine Culture, Routine Final report    Organism ID, Bacteria Comment     Comment: Culture shows less than 10,000 colony forming units of bacteria per milliliter of urine. This colony count is not generally considered to be clinically significant.   Urinalysis, Routine w reflex microscopic     Status: None   Collection Time: 06/19/18 12:04 PM  Result Value Ref Range   Specific Gravity, UA 1.019 1.005 - 1.030   pH, UA 5.5 5.0 - 7.5   Color, UA Yellow Yellow   Appearance Ur Clear Clear   Leukocytes, UA Negative Negative   Protein, UA Negative Negative/Trace   Glucose, UA Negative Negative   Ketones, UA Negative Negative   RBC, UA Negative Negative   Bilirubin, UA Negative Negative   Urobilinogen, Ur 0.2 0.2 - 1.0 mg/dL   Nitrite, UA Negative Negative   Microscopic Examination Comment     Comment: Microscopic not indicated and not performed.  CBC     Status: None   Collection Time: 06/20/18 12:15 PM  Result Value Ref Range   WBC 7.1 3.6 - 11.0 K/uL   RBC 4.60 3.80 - 5.20 MIL/uL   Hemoglobin 13.0 12.0 - 16.0 g/dL   HCT 37.9 35.0 - 47.0 %   MCV 82.5 80.0 - 100.0 fL   MCH 28.2 26.0 - 34.0 pg   MCHC 34.2 32.0 - 36.0 g/dL   RDW 14.3 11.5 - 14.5 %   Platelets 261 150 - 440 K/uL    Comment: Performed at [facility], [address], [place], [place] [address]  Basic metabolic panel     Status: Abnormal   Collection Time: 06/20/18 12:15 PM  Result Value Ref Range   Sodium 140 135 - 145 mmol/L   Potassium 3.4 (L) 3.5 - 5.1 mmol/L   Chloride 104 98 - 111 mmol/L    Comment: Please note change in reference range.   CO2 26 22 - 32 mmol/L   Glucose, Bld 102 (H) 70 - 99 mg/dL    Comment: Please note change in reference range.   BUN 15 8 - 23 mg/dL    Comment: Please note change in reference range.   Creatinine, Ser 0.30 (L) 0.44 - 1.00 mg/dL   Calcium 8.8 (L) 8.9 - 10.3 mg/dL   GFR calc non Af Amer >60 >60 mL/min   GFR calc Af Amer >60 >60 mL/min    Comment: (NOTE) The eGFR has been calculated using the CKD EPI equation. This calculation has not been validated in all clinical situations. eGFR's persistently <60 mL/min signify possible Chronic Kidney Disease.    Anion gap 10 5 - 15    Comment: Performed at [facility], [address] [place], [place] [address]  Protime-INR     Status: None   Collection Time: 06/20/18 12:15 PM  Result Value Ref Range   Prothrombin Time 14.9 11.4 - 15.2 seconds   INR 1.18     Comment: Performed at [facility], [address], [place], [place] [address]  APTT  Status: None   Collection Time: 06/20/18 12:15 PM  Result Value Ref Range   aPTT 36 24 - 36 seconds    Comment: Performed at [facility], [address] [place], [place] [address]  Urinalysis, Routine w reflex microscopic     Status: Abnormal   Collection Time: 06/20/18 12:15 PM  Result Value Ref Range   Color, Urine YELLOW (A) YELLOW   APPearance CLEAR (A) CLEAR   Specific Gravity, Urine 1.008 1.005 - 1.030   pH 5.0 5.0 - 8.0   Glucose, UA NEGATIVE NEGATIVE mg/dL   Hgb urine dipstick NEGATIVE NEGATIVE   Bilirubin Urine NEGATIVE NEGATIVE   Ketones, ur NEGATIVE NEGATIVE mg/dL   Protein, ur NEGATIVE NEGATIVE mg/dL   Nitrite NEGATIVE NEGATIVE   Leukocytes, UA NEGATIVE NEGATIVE     Comment: Performed at [facility], [address] [place], [place] [address]  Urine culture     Status: Abnormal   Collection Time: 06/20/18 12:15 PM  Result Value Ref Range   Specimen Description      URINE, RANDOM Performed at [facility], [address], [place], [place] [address]    Special Requests      NONE Performed at [facility], [address], [place], [place] [address]    Culture (A)     <10,000 COLONIES/mL INSIGNIFICANT GROWTH Performed at [facility] [address] [place], [place] [address]    Report Status 06/21/2018 FINAL   Surgical pcr screen     Status: None   Collection Time: 06/20/18 12:15 PM  Result Value Ref Range   MRSA, PCR NEGATIVE NEGATIVE   Staphylococcus aureus NEGATIVE NEGATIVE    Comment: (NOTE) The Xpert SA Assay (FDA approved for NASAL specimens in patients 45 years of age and older), is one component of a comprehensive surveillance program. It is not intended to diagnose infection nor to guide or monitor treatment. Performed at [facility], [address] [place], [place] [address]   Type and screen [name]     Status: None   Collection Time: 06/20/18 12:15 PM  Result Value Ref Range   ABO/RH(D) AB POS    Antibody Screen NEG    Sample Expiration 07/04/2018    Extend sample reason      NO TRANSFUSIONS OR PREGNANCY IN THE PAST 3 MONTHS Performed at [facility], [address] [place], [place] [address]   ABO/Rh     Status: None   Collection Time: 06/27/18  6:43 AM  Result Value Ref Range   ABO/RH(D)      AB POS Performed at [facility], [address], [place], [place] [address]   Surgical pathology     Status: None   Collection Time: 06/27/18  9:50 AM  Result Value Ref Range   SURGICAL PATHOLOGY      Surgical Pathology CASE: [id number] PATIENT: Katy Apo Surgical Pathology Report     SPECIMEN SUBMITTED: A. Humeral  head, right  CLINICAL HISTORY: None provided  PRE-OPERATIVE DIAGNOSIS: Rotator cuff arthropathy right, primary osteoarthritis of right shoulder  POST-OPERATIVE DIAGNOSIS: Right rotator cuff tear     DIAGNOSIS: A. HUMERAL HEAD, RIGHT; REVERSE SHOULDER ARTHROPLASTY: - OSTEOARTHRITIS.   GROSS DESCRIPTION: A. Labeled: Right humeral head Received: In formalin Size of specimen:      Head - 4.3 x 4.2 x 1.5 cm      Additional tissue: 5.3 x 4.5 x 1.1 cm bony Articular surface: Tan to  brown focally nodular Cut surface: tan-brown Other findings: two of the additional fragments bony diffusely nodular and gray  Block summary: 1 - representative sections  Tissue decalcification: Yes   Final Diagnosis performed by Bryan Lemma, MD.   Electronically signed 06/30/2018 2:15:39PM The electronic signature indicates that the named Attending Pa thologist has evaluated the specimen  Technical component performed at [facility], [address], [place], [place] [address] Lab: [phone] Dir: Rush Farmer, MD, MMM  Professional component performed at [facility], [facility], [place], [place], [place] [address] Lab: [phone] Dir: Dellia Nims. Rubinas, MD   CBC with Differential/Platelet     Status: Abnormal   Collection Time: 06/28/18  3:21 AM  Result Value Ref Range   WBC 11.3 (H) 3.6 - 11.0 K/uL   RBC 4.28 3.80 - 5.20 MIL/uL   Hemoglobin 11.8 (L) 12.0 - 16.0 g/dL   HCT 35.2 35.0 - 47.0 %   MCV 82.0 80.0 - 100.0 fL   MCH 27.6 26.0 - 34.0 pg   MCHC 33.6 32.0 - 36.0 g/dL   RDW 14.3 11.5 - 14.5 %   Platelets 226 150 - 440 K/uL   Neutrophils Relative % 85 %   Neutro Abs 9.6 (H) 1.4 - 6.5 K/uL   Lymphocytes Relative 9 %   Lymphs Abs 1.0 1.0 - 3.6 K/uL   Monocytes Relative 6 %   Monocytes Absolute 0.7 0.2 - 0.9 K/uL   Eosinophils Relative 0 %   Eosinophils Absolute 0.0 0 - 0.7 K/uL   Basophils Relative 0 %   Basophils Absolute 0.0 0 - 0.1 K/uL    Comment:  Performed at [facility], [address] [place], [place] [address]  Basic metabolic panel     Status: Abnormal   Collection Time: 06/28/18  3:21 AM  Result Value Ref Range   Sodium 137 135 - 145 mmol/L   Potassium 2.9 (L) 3.5 - 5.1 mmol/L   Chloride 100 98 - 111 mmol/L    Comment: Please note change in reference range.   CO2 27 22 - 32 mmol/L   Glucose, Bld 151 (H) 70 - 99 mg/dL    Comment: Please note change in reference range.   BUN 14 8 - 23 mg/dL    Comment: Please note change in reference range.   Creatinine, Ser 0.38 (L) 0.44 - 1.00 mg/dL   Calcium 8.5 (L) 8.9 - 10.3 mg/dL   GFR calc non Af Amer >60 >60 mL/min   GFR calc Af Amer >60 >60 mL/min    Comment: (NOTE) The eGFR has been calculated using the CKD EPI equation. This calculation has not been validated in all clinical situations. eGFR's persistently <60 mL/min signify possible Chronic Kidney Disease.    Anion gap 10 5 - 15    Comment: Performed at [facility], [address] [place], [place] [address]  Potassium     Status: None   Collection Time: 06/28/18  3:21 PM  Result Value Ref Range   Potassium 3.7 3.5 - 5.1 mmol/L    Comment: Performed at [facility], [address] [place],  [address]  Basic metabolic panel     Status: Abnormal   Collection Time: 06/29/18  4:49 AM  Result Value Ref Range   Sodium 139 135 - 145 mmol/L   Potassium 3.8 3.5 - 5.1 mmol/L   Chloride 104 98 - 111 mmol/L    Comment: Please note change in reference range.   CO2 30  22 - 32 mmol/L   Glucose, Bld 147 (H) 70 - 99 mg/dL    Comment: Please note change in reference range.   BUN 16 8 - 23 mg/dL    Comment: Please note change in reference range.   Creatinine, Ser 0.44 0.44 - 1.00 mg/dL   Calcium 8.4 (L) 8.9 - 10.3 mg/dL   GFR calc non Af Amer >60 >60 mL/min   GFR calc Af Amer >60 >60 mL/min    Comment: (NOTE) The eGFR has been calculated using the CKD EPI equation. This calculation has not  been validated in all clinical situations. eGFR's persistently <60 mL/min signify possible Chronic Kidney Disease.    Anion gap 5 5 - 15    Comment: Performed at [facility], [address] [place], [place] [address]  CBC     Status: Abnormal   Collection Time: 06/29/18  4:49 AM  Result Value Ref Range   WBC 11.5 (H) 3.6 - 11.0 K/uL   RBC 4.15 3.80 - 5.20 MIL/uL   Hemoglobin 11.8 (L) 12.0 - 16.0 g/dL   HCT 34.4 (L) 35.0 - 47.0 %   MCV 82.9 80.0 - 100.0 fL   MCH 28.5 26.0 - 34.0 pg   MCHC 34.4 32.0 - 36.0 g/dL   RDW 14.2 11.5 - 14.5 %   Platelets 233 150 - 440 K/uL    Comment: Performed at [facility], [address] [place], [place] [address]  Basic metabolic panel     Status: Abnormal   Collection Time: 06/30/18  4:12 AM  Result Value Ref Range   Sodium 140 135 - 145 mmol/L   Potassium 3.1 (L) 3.5 - 5.1 mmol/L   Chloride 100 98 - 111 mmol/L    Comment: Please note change in reference range.   CO2 30 22 - 32 mmol/L   Glucose, Bld 126 (H) 70 - 99 mg/dL    Comment: Please note change in reference range.   BUN 18 8 - 23 mg/dL    Comment: Please note change in reference range.   Creatinine, Ser 0.35 (L) 0.44 - 1.00 mg/dL   Calcium 8.3 (L) 8.9 - 10.3 mg/dL   GFR calc non Af Amer >60 >60 mL/min   GFR calc Af Amer >60 >60 mL/min    Comment: (NOTE) The eGFR has been calculated using the CKD EPI equation. This calculation has not been validated in all clinical situations. eGFR's persistently <60 mL/min signify possible Chronic Kidney Disease.    Anion gap 10 5 - 15    Comment: Performed at [facility], [address] [place], [place] [address]  Basic metabolic panel     Status: Abnormal   Collection Time: 07/11/18  5:30 AM  Result Value Ref Range   Sodium 137 135 - 145 mmol/L   Potassium 2.8 (L) 3.5 - 5.1 mmol/L   Chloride 97 (L) 98 - 111 mmol/L    Comment: Please note change in reference range.   CO2 33 (H) 22 - 32 mmol/L   Glucose, Bld  105 (H) 70 - 99 mg/dL    Comment: Please note change in reference range.   BUN 10 8 - 23 mg/dL    Comment: Please note change in reference range.   Creatinine, Ser 0.45 0.44 - 1.00 mg/dL   Calcium 8.3 (L) 8.9 - 10.3 mg/dL   GFR calc non Af Amer >60 >60 mL/min   GFR calc Af Amer >60 >60 mL/min    Comment: (  NOTE) The eGFR has been calculated using the CKD EPI equation. This calculation has not been validated in all clinical situations. eGFR's persistently <60 mL/min signify possible Chronic Kidney Disease.    Anion gap 7 5 - 15    Comment: Performed at [facility], [address] [place], [place] [address]   Objective  Body mass index is 34.42 kg/m. Wt Readings from Last 3 Encounters:  08/29/18 188 lb 3.2 oz (85.4 kg)  08/26/18 200 lb (90.7 kg)  06/27/18 195 lb (88.5 kg)   Temp Readings from Last 3 Encounters:  08/29/18 98.2 F (36.8 C) (Oral)  08/26/18 98.5 F (36.9 C) (Oral)  06/30/18 98.2 F (36.8 C) (Oral)   BP Readings from Last 3 Encounters:  08/29/18 (!) 142/64  06/30/18 (!) 123/59  06/20/18 (!) 146/75   Pulse Readings from Last 3 Encounters:  08/29/18 79  08/26/18 79  06/30/18 70    Physical Exam  Constitutional: She is oriented to person, place, and time. She appears well-developed and well-nourished. She is cooperative.  HENT:  Head: Normocephalic and atraumatic.  Mouth/Throat: Oropharynx is clear and moist and mucous membranes are normal.  Eyes: Pupils are equal, round, and reactive to light. Conjunctivae are normal.  Cardiovascular: Normal rate and regular rhythm.  Murmur heard. No Afib noted today   Pulmonary/Chest: Effort normal and breath sounds normal.  Musculoskeletal:  Limited ROM right shoulder   Neurological: She is alert and oriented to person, place, and time. Gait normal.  Walking with walking device today  Skin: Skin is warm, dry and intact.  Scar ant right shoulder s/p surgery   Psychiatric: She has a normal mood and  affect. Her speech is normal and behavior is normal. Judgment and thought content normal. Cognition and memory are normal.  Nursing note and vitals reviewed.   Assessment   1. Fall 08/26/18 mechanical  2. HTN/HLD 3. hypoK  4. S/p right shoulder reverse surgery Dr. Roland Rack  5. HM Plan   1. Monitor  2. Refilled losartan 100 mg qd  3. Check labs today CMET, CBC, Mag, A1C, lipid  4. F/u PT 2x per week and Dr. Roland Rack  5.  UTD vaccines  -flu shot given today  rec shingrix vaccine  pna 23 at f/u   Colonoscopy had 12/24/13 consider cologuard if candidate in future  mammo had 07/19/17 neg re ordered today  Out of pap window s/p hysterectomy then ovaries removed in 2002 DEXA 05/29/15 osteopenia on vit D nl vit D will rec calcium 600 mg bid in future   Provider: Dr. Olivia Mackie McLean-Scocuzza-Internal Medicine

## 2018-08-30 DIAGNOSIS — M25511 Pain in right shoulder: Secondary | ICD-10-CM | POA: Diagnosis not present

## 2018-08-30 DIAGNOSIS — Z96611 Presence of right artificial shoulder joint: Secondary | ICD-10-CM | POA: Diagnosis not present

## 2018-08-31 DIAGNOSIS — H401132 Primary open-angle glaucoma, bilateral, moderate stage: Secondary | ICD-10-CM | POA: Diagnosis not present

## 2018-09-01 DIAGNOSIS — Z96611 Presence of right artificial shoulder joint: Secondary | ICD-10-CM | POA: Diagnosis not present

## 2018-09-01 DIAGNOSIS — M25511 Pain in right shoulder: Secondary | ICD-10-CM | POA: Diagnosis not present

## 2018-09-05 DIAGNOSIS — Z96611 Presence of right artificial shoulder joint: Secondary | ICD-10-CM | POA: Diagnosis not present

## 2018-09-05 DIAGNOSIS — M25511 Pain in right shoulder: Secondary | ICD-10-CM | POA: Diagnosis not present

## 2018-09-07 DIAGNOSIS — H401132 Primary open-angle glaucoma, bilateral, moderate stage: Secondary | ICD-10-CM | POA: Diagnosis not present

## 2018-09-08 DIAGNOSIS — M25511 Pain in right shoulder: Secondary | ICD-10-CM | POA: Diagnosis not present

## 2018-09-08 DIAGNOSIS — Z96611 Presence of right artificial shoulder joint: Secondary | ICD-10-CM | POA: Diagnosis not present

## 2018-09-12 DIAGNOSIS — Z96611 Presence of right artificial shoulder joint: Secondary | ICD-10-CM | POA: Diagnosis not present

## 2018-09-12 DIAGNOSIS — M25511 Pain in right shoulder: Secondary | ICD-10-CM | POA: Diagnosis not present

## 2018-09-15 DIAGNOSIS — M25511 Pain in right shoulder: Secondary | ICD-10-CM | POA: Diagnosis not present

## 2018-09-15 DIAGNOSIS — Z96611 Presence of right artificial shoulder joint: Secondary | ICD-10-CM | POA: Diagnosis not present

## 2018-09-19 DIAGNOSIS — M25511 Pain in right shoulder: Secondary | ICD-10-CM | POA: Diagnosis not present

## 2018-09-19 DIAGNOSIS — Z96611 Presence of right artificial shoulder joint: Secondary | ICD-10-CM | POA: Diagnosis not present

## 2018-09-20 ENCOUNTER — Ambulatory Visit (INDEPENDENT_AMBULATORY_CARE_PROVIDER_SITE_OTHER): Payer: Medicare Other | Admitting: Internal Medicine

## 2018-09-20 ENCOUNTER — Encounter: Payer: Self-pay | Admitting: Internal Medicine

## 2018-09-20 VITALS — BP 130/66 | HR 80 | Temp 98.9°F | Ht 62.0 in | Wt 185.4 lb

## 2018-09-20 DIAGNOSIS — J101 Influenza due to other identified influenza virus with other respiratory manifestations: Secondary | ICD-10-CM

## 2018-09-20 DIAGNOSIS — J029 Acute pharyngitis, unspecified: Secondary | ICD-10-CM

## 2018-09-20 LAB — POC INFLUENZA A&B (BINAX/QUICKVUE)
Influenza A, POC: POSITIVE — AB
Influenza B, POC: NEGATIVE

## 2018-09-20 LAB — POCT RAPID STREP A (OFFICE): RAPID STREP A SCREEN: NEGATIVE

## 2018-09-20 MED ORDER — OSELTAMIVIR PHOSPHATE 75 MG PO CAPS: 75.0000 mg | ORAL_CAPSULE | Freq: Two times a day (BID) | ORAL | 0 refills | Status: DC

## 2018-09-20 MED ORDER — AMOXICILLIN-POT CLAVULANATE 875-125 MG PO TABS: 1.0000 | ORAL_TABLET | Freq: Two times a day (BID) | ORAL | 0 refills | Status: DC

## 2018-09-20 NOTE — Progress Notes (Signed)
Pre visit review using our clinic review tool, if applicable. No additional management support is needed unless otherwise documented below in the visit note. 

## 2018-09-20 NOTE — Patient Instructions (Addendum)
Hope you feel better   Influenza A, Adult Influenza, more commonly known as "the flu," is a viral infection that primarily affects the respiratory tract. The respiratory tract includes organs that help you breathe, such as the lungs, nose, and throat. The flu causes many common cold symptoms, as well as a high fever and body aches. The flu spreads easily from person to person (is contagious). Getting a flu shot (influenza vaccination) every year is the best way to prevent influenza. What are the causes? Influenza is caused by a virus. You can catch the virus by:  Breathing in droplets from an infected person's cough or sneeze.  Touching something that was recently contaminated with the virus and then touching your mouth, nose, or eyes.  What increases the risk? The following factors may make you more likely to get the flu:  Not cleaning your hands frequently with soap and water or alcohol-based hand sanitizer.  Having close contact with many people during cold and flu season.  Touching your mouth, eyes, or nose without washing or sanitizing your hands first.  Not drinking enough fluids or not eating a healthy diet.  Not getting enough sleep or exercise.  Being under a high amount of stress.  Not getting a yearly (annual) flu shot.  You may be at a higher risk of complications from the flu, such as a severe lung infection (pneumonia), if you:  Are over the age of 110.  Are pregnant.  Have a weakened disease-fighting system (immune system). You may have a weakened immune system if you: ? Have HIV or AIDS. ? Are undergoing chemotherapy. ? Aretaking medicines that reduce the activity of (suppress) the immune system.  Have a long-term (chronic) illness, such as heart disease, kidney disease, diabetes, or lung disease.  Have a liver disorder.  Are obese.  Have anemia.  What are the signs or symptoms? Symptoms of this condition typically last 4-10 days and may  include:  Fever.  Chills.  Headache, body aches, or muscle aches.  Sore throat.  Cough.  Runny or congested nose.  Chest discomfort and cough.  Poor appetite.  Weakness or tiredness (fatigue).  Dizziness.  Nausea or vomiting.  How is this diagnosed? This condition may be diagnosed based on your medical history and a physical exam. Your health care provider may do a nose or throat swab test to confirm the diagnosis. How is this treated? If influenza is detected early, you can be treated with antiviral medicine that can reduce the length of your illness and the severity of your symptoms. This medicine may be given by mouth (orally) or through an IV tube that is inserted in one of your veins. The goal of treatment is to relieve symptoms by taking care of yourself at home. This may include taking over-the-counter medicines, drinking plenty of fluids, and adding humidity to the air in your home. In some cases, influenza goes away on its own. Severe influenza or complications from influenza may be treated in a hospital. Follow these instructions at home:  Take over-the-counter and prescription medicines only as told by your health care provider.  Use a cool mist humidifier to add humidity to the air in your home. This can make breathing easier.  Rest as needed.  Drink enough fluid to keep your urine clear or pale yellow.  Cover your mouth and nose when you cough or sneeze.  Wash your hands with soap and water often, especially after you cough or sneeze. If soap and  water are not available, use hand sanitizer.  Stay home from work or school as told by your health care provider. Unless you are visiting your health care provider, try to avoid leaving home until your fever has been gone for 24 hours without the use of medicine.  Keep all follow-up visits as told by your health care provider. This is important. How is this prevented?  Getting an annual flu shot is the best way  to avoid getting the flu. You may get the flu shot in late summer, fall, or winter. Ask your health care provider when you should get your flu shot.  Wash your hands often or use hand sanitizer often.  Avoid contact with people who are sick during cold and flu season.  Eat a healthy diet, drink plenty of fluids, get enough sleep, and exercise regularly. Contact a health care provider if:  You develop new symptoms.  You have: ? Chest pain. ? Diarrhea. ? A fever.  Your cough gets worse.  You produce more mucus.  You feel nauseous or you vomit. Get help right away if:  You develop shortness of breath or difficulty breathing.  Your skin or nails turn a bluish color.  You have severe pain or stiffness in your neck.  You develop a sudden headache or sudden pain in your face or ear.  You cannot stop vomiting. This information is not intended to replace advice given to you by your health care provider. Make sure you discuss any questions you have with your health care provider. Document Released: 12/10/2000 Document Revised: 05/20/2016 Document Reviewed: 10/07/2015 Elsevier Interactive Patient Education  2017 [employer].

## 2018-09-20 NOTE — Progress Notes (Addendum)
Chief Complaint  Patient presents with  . Sore Throat   Sick visit  1. C/o sore throat since yesterday am and painful to swallow. No sick contacts that she knows of but she has been going to PT for right shoulder s/p surgery and has f/u with Dr. Roland Rack 09/22/18. No fever she is coughing with mucus and blood tinged last night and has right ear pain    Review of Systems  Constitutional: Negative for fever.  HENT: Positive for ear pain and sore throat.   Respiratory: Positive for cough and sputum production. Negative for shortness of breath.   Cardiovascular: Negative for chest pain.  Musculoskeletal: Negative for myalgias.       Limited ROM right shoulder   Neurological: Negative for headaches.  Psychiatric/Behavioral: Negative for depression and memory loss.   Past Medical History:  Diagnosis Date  . Allergic rhinitis   . Arthritis   . CHF (congestive heart failure) ([place]) 04/2016   developed 2 weeks after identifying AF  . Complication of anesthesia   . Dysrhythmia 04/2016   a fib    . GERD (gastroesophageal reflux disease)    occasionally has reflux/heartburn  . History of pelvic mass    s/p resection, benign  . Hypercholesteremia   . Hypertension   . Mitral regurgitation    noted echo 04/2016   . Osteoporosis    per pt report on Medical Screening Form   . PONV (postoperative nausea and vomiting)   . Pyelonephritis    remote hx   . Shortness of breath dyspnea   . Varicella zoster 2007   Past Surgical History:  Procedure Laterality Date  . [place]   lower back  . bladder prolapse repair    . BREAST CYST EXCISION Left    1990s  . CATARACT EXTRACTION Bilateral 2014   Dr. Sandra Cockayne  . ELECTROPHYSIOLOGIC STUDY N/A 06/15/2016   Procedure: CARDIOVERSION;  Surgeon: [name], MD;  Location: [facility] ORS;  Service: Cardiovascular;  Laterality: N/A;  . EYE SURGERY     bilateral cataract  . FOOT SURGERY    . HERNIA REPAIR  2013   inguinal  . JOINT  REPLACEMENT  [id number]   bilateral knee replacements  . KNEE ARTHROSCOPY     right  . RECTOCELE REPAIR    . REVERSE SHOULDER ARTHROPLASTY Right 06/27/2018   Procedure: REVERSE SHOULDER ARTHROPLASTY;  Surgeon: Corky Mull, MD;  Location: [facility] ORS;  Service: Orthopedics;  Laterality: Right;  . TOTAL KNEE ARTHROPLASTY     left  . VAGINAL HYSTERECTOMY  1970   benign reasons   Family History  Problem Relation Age of Onset  . Emphysema Mother   . Heart disease Father   . Breast cancer Maternal Aunt 80   Social History   Socioeconomic History  . Marital status: Single    Spouse name: Not on file  . Number of children: Not on file  . Years of education: Not on file  . Highest education level: Not on file  Occupational History  . Not on file  Social Needs  . Financial resource strain: Not on file  . Food insecurity:    Worry: Not on file    Inability: Not on file  . Transportation needs:    Medical: Not on file    Non-medical: Not on file  Tobacco Use  . Smoking status: Never Smoker  . Smokeless tobacco: Never Used  Substance and Sexual Activity  . Alcohol use: Yes  Alcohol/week: 2.0 standard drinks    Types: 2 Glasses of wine per week    Comment: occassional  . Drug use: No  . Sexual activity: Never  Lifestyle  . Physical activity:    Days per week: Not on file    Minutes per session: Not on file  . Stress: Not on file  Relationships  . Social connections:    Talks on phone: Not on file    Gets together: Not on file    Attends religious service: Not on file    Active member of club or organization: Not on file    Attends meetings of clubs or organizations: Not on file    Relationship status: Not on file  . Intimate partner violence:    Fear of current or ex partner: Not on file    Emotionally abused: Not on file    Physically abused: Not on file    Forced sexual activity: Not on file  Other Topics Concern  . Not on file  Social History Narrative   Widowed.    Very active within her community.   Enjoys singing, being active in her church, spending time with family.   Lives with son   Current Meds  Medication Sig  . acetaminophen (TYLENOL) 500 MG tablet Take 500-1,000 mg by mouth 2 (two) times daily as needed for moderate pain or headache.  . Cholecalciferol (VITAMIN D3) 2000 units capsule Take 1,000 Units by mouth daily.   . cyclobenzaprine (FLEXERIL) 5 MG tablet Take 5 mg by mouth 2 (two) times daily.  . diclofenac sodium (VOLTAREN) 1 % GEL Apply 1 application topically as needed (foot pain).   Marland Kitchen diltiazem (TIAZAC) 120 MG 24 hr capsule Take 120 mg by mouth once daily at night  . docusate calcium (SURFAK) 240 MG capsule Take 240 mg by mouth at bedtime.   . flecainide (TAMBOCOR) 50 MG tablet Take 1 tablet (50 mg total) by mouth 2 (two) times daily.  . furosemide (LASIX) 40 MG tablet Take 40 mg by mouth daily.   . hydrALAZINE (APRESOLINE) 25 MG tablet Take 1 tablet (25 mg total) by mouth 3 (three) times daily. (Patient taking differently: Take 25 mg by mouth 2 (two) times daily. )  . losartan (COZAAR) 100 MG tablet Take 1 tablet (100 mg total) by mouth daily.  . ondansetron (ZOFRAN) 4 MG tablet Take 1 tablet (4 mg total) by mouth every 6 (six) hours as needed for nausea.  . pravastatin (PRAVACHOL) 20 MG tablet Take 1 tablet (20 mg total) by mouth daily. (Patient taking differently: Take 20 mg by mouth every evening. )  . rivaroxaban (XARELTO) 20 MG TABS tablet Take 1 tablet (20 mg total) by mouth daily with supper.  Marland Kitchen scopolamine (TRANSDERM-SCOP) 1 MG/3DAYS Place 1 patch (1.5 mg total) onto the skin every 3 (three) days.  . [DISCONTINUED] calcium carbonate (TUMS - DOSED IN MG ELEMENTAL CALCIUM) 500 MG chewable tablet Chew 1 tablet by mouth daily as needed for indigestion or heartburn.  . [DISCONTINUED] loratadine (CLARITIN) 10 MG tablet Take 10 mg by mouth daily as needed for allergies.  . [DISCONTINUED] losartan (COZAAR) 100 MG tablet Take 1 tablet  (100 mg total) by mouth daily.  . [DISCONTINUED] oxyCODONE (OXY IR/ROXICODONE) 5 MG immediate release tablet Take 1-2 tablets (5-10 mg total) by mouth every 4 (four) hours as needed for moderate pain.  . [DISCONTINUED] timolol (TIMOPTIC) 0.5 % ophthalmic solution Place 1 drop into both eyes daily.   . [DISCONTINUED]  traMADol (ULTRAM) 50 MG tablet Take 1-2 tablets (50-100 mg total) by mouth every 6 (six) hours as needed for moderate pain.   Allergies  Allergen Reactions  . Amlodipine Swelling    Ankle swelling  . Azo Urinary Tract Support Other (See Comments)    Skin turned orange  . Erythromycin Nausea And Vomiting   Recent Results (from the past 2160 hour(s))  ABO/Rh     Status: None   Collection Time: 06/27/18  6:43 AM  Result Value Ref Range   ABO/RH(D)      AB POS Performed at [facility], [address] [place], [place] [address]   Surgical pathology     Status: None   Collection Time: 06/27/18  9:50 AM  Result Value Ref Range   SURGICAL PATHOLOGY      Surgical Pathology CASE: [id number] PATIENT: Tina Howard Surgical Pathology Report     SPECIMEN SUBMITTED: A. Humeral head, right  CLINICAL HISTORY: None provided  PRE-OPERATIVE DIAGNOSIS: Rotator cuff arthropathy right, primary osteoarthritis of right shoulder  POST-OPERATIVE DIAGNOSIS: Right rotator cuff tear     DIAGNOSIS: A. HUMERAL HEAD, RIGHT; REVERSE SHOULDER ARTHROPLASTY: - OSTEOARTHRITIS.   GROSS DESCRIPTION: A. Labeled: Right humeral head Received: In formalin Size of specimen:      Head - 4.3 x 4.2 x 1.5 cm      Additional tissue: 5.3 x 4.5 x 1.1 cm bony Articular surface: Tan to brown focally nodular Cut surface: tan-brown Other findings: two of the additional fragments bony diffusely nodular and gray  Block summary: 1 - representative sections  Tissue decalcification: Yes   Final Diagnosis performed by Bryan Lemma, MD.   Electronically signed 06/30/2018  2:15:39PM The electronic signature indicates that the named Attending Pa thologist has evaluated the specimen  Technical component performed at [facility], [address], [place], [place] [address] Lab: [phone] Dir: Rush Farmer, MD, MMM  Professional component performed at [facility], [facility], [place], [place], [place] [address] Lab: [phone] Dir: Dellia Nims. Rubinas, MD   CBC with Differential/Platelet     Status: Abnormal   Collection Time: 06/28/18  3:21 AM  Result Value Ref Range   WBC 11.3 (H) 3.6 - 11.0 K/uL   RBC 4.28 3.80 - 5.20 MIL/uL   Hemoglobin 11.8 (L) 12.0 - 16.0 g/dL   HCT 35.2 35.0 - 47.0 %   MCV 82.0 80.0 - 100.0 fL   MCH 27.6 26.0 - 34.0 pg   MCHC 33.6 32.0 - 36.0 g/dL   RDW 14.3 11.5 - 14.5 %   Platelets 226 150 - 440 K/uL   Neutrophils Relative % 85 %   Neutro Abs 9.6 (H) 1.4 - 6.5 K/uL   Lymphocytes Relative 9 %   Lymphs Abs 1.0 1.0 - 3.6 K/uL   Monocytes Relative 6 %   Monocytes Absolute 0.7 0.2 - 0.9 K/uL   Eosinophils Relative 0 %   Eosinophils Absolute 0.0 0 - 0.7 K/uL   Basophils Relative 0 %   Basophils Absolute 0.0 0 - 0.1 K/uL    Comment: Performed at [facility], [address] [place], [place] [address]  Basic metabolic panel     Status: Abnormal   Collection Time: 06/28/18  3:21 AM  Result Value Ref Range   Sodium 137 135 - 145 mmol/L   Potassium 2.9 (L) 3.5 - 5.1 mmol/L   Chloride 100 98 - 111 mmol/L    Comment: Please note change in reference range.   CO2  27 22 - 32 mmol/L   Glucose, Bld 151 (H) 70 - 99 mg/dL    Comment: Please note change in reference range.   BUN 14 8 - 23 mg/dL    Comment: Please note change in reference range.   Creatinine, Ser 0.38 (L) 0.44 - 1.00 mg/dL   Calcium 8.5 (L) 8.9 - 10.3 mg/dL   GFR calc non Af Amer >60 >60 mL/min   GFR calc Af Amer >60 >60 mL/min    Comment: (NOTE) The eGFR has been calculated using the CKD EPI equation. This calculation has not been  validated in all clinical situations. eGFR's persistently <60 mL/min signify possible Chronic Kidney Disease.    Anion gap 10 5 - 15    Comment: Performed at [facility], [address] [place], [place] [address]  Potassium     Status: None   Collection Time: 06/28/18  3:21 PM  Result Value Ref Range   Potassium 3.7 3.5 - 5.1 mmol/L    Comment: Performed at [facility], [address] [place], [place] [address]  Basic metabolic panel     Status: Abnormal   Collection Time: 06/29/18  4:49 AM  Result Value Ref Range   Sodium 139 135 - 145 mmol/L   Potassium 3.8 3.5 - 5.1 mmol/L   Chloride 104 98 - 111 mmol/L    Comment: Please note change in reference range.   CO2 30 22 - 32 mmol/L   Glucose, Bld 147 (H) 70 - 99 mg/dL    Comment: Please note change in reference range.   BUN 16 8 - 23 mg/dL    Comment: Please note change in reference range.   Creatinine, Ser 0.44 0.44 - 1.00 mg/dL   Calcium 8.4 (L) 8.9 - 10.3 mg/dL   GFR calc non Af Amer >60 >60 mL/min   GFR calc Af Amer >60 >60 mL/min    Comment: (NOTE) The eGFR has been calculated using the CKD EPI equation. This calculation has not been validated in all clinical situations. eGFR's persistently <60 mL/min signify possible Chronic Kidney Disease.    Anion gap 5 5 - 15    Comment: Performed at [facility], [address] [place], [place] [address]  CBC     Status: Abnormal   Collection Time: 06/29/18  4:49 AM  Result Value Ref Range   WBC 11.5 (H) 3.6 - 11.0 K/uL   RBC 4.15 3.80 - 5.20 MIL/uL   Hemoglobin 11.8 (L) 12.0 - 16.0 g/dL   HCT 34.4 (L) 35.0 - 47.0 %   MCV 82.9 80.0 - 100.0 fL   MCH 28.5 26.0 - 34.0 pg   MCHC 34.4 32.0 - 36.0 g/dL   RDW 14.2 11.5 - 14.5 %   Platelets 233 150 - 440 K/uL    Comment: Performed at [facility], [address] [place], [place] [address]  Basic metabolic panel     Status: Abnormal   Collection Time: 06/30/18  4:12 AM  Result Value Ref  Range   Sodium 140 135 - 145 mmol/L   Potassium 3.1 (L) 3.5 - 5.1 mmol/L   Chloride 100 98 - 111 mmol/L    Comment: Please note change in reference range.   CO2 30 22 - 32 mmol/L   Glucose, Bld 126 (H) 70 - 99 mg/dL    Comment: Please note change in reference range.   BUN 18 8 - 23 mg/dL    Comment: Please note change in  reference range.   Creatinine, Ser 0.35 (L) 0.44 - 1.00 mg/dL   Calcium 8.3 (L) 8.9 - 10.3 mg/dL   GFR calc non Af Amer >60 >60 mL/min   GFR calc Af Amer >60 >60 mL/min    Comment: (NOTE) The eGFR has been calculated using the CKD EPI equation. This calculation has not been validated in all clinical situations. eGFR's persistently <60 mL/min signify possible Chronic Kidney Disease.    Anion gap 10 5 - 15    Comment: Performed at [facility], [address] [place], [place] [address]  Basic metabolic panel     Status: Abnormal   Collection Time: 07/11/18  5:30 AM  Result Value Ref Range   Sodium 137 135 - 145 mmol/L   Potassium 2.8 (L) 3.5 - 5.1 mmol/L   Chloride 97 (L) 98 - 111 mmol/L    Comment: Please note change in reference range.   CO2 33 (H) 22 - 32 mmol/L   Glucose, Bld 105 (H) 70 - 99 mg/dL    Comment: Please note change in reference range.   BUN 10 8 - 23 mg/dL    Comment: Please note change in reference range.   Creatinine, Ser 0.45 0.44 - 1.00 mg/dL   Calcium 8.3 (L) 8.9 - 10.3 mg/dL   GFR calc non Af Amer >60 >60 mL/min   GFR calc Af Amer >60 >60 mL/min    Comment: (NOTE) The eGFR has been calculated using the CKD EPI equation. This calculation has not been validated in all clinical situations. eGFR's persistently <60 mL/min signify possible Chronic Kidney Disease.    Anion gap 7 5 - 15    Comment: Performed at [facility], [address] [place], [place] [address]  Comprehensive metabolic panel     Status: Abnormal   Collection Time: 08/29/18  1:30 PM  Result Value Ref Range   Sodium 141 135 - 145 mEq/L    Potassium 3.6 3.5 - 5.1 mEq/L   Chloride 102 96 - 112 mEq/L   CO2 30 19 - 32 mEq/L   Glucose, Bld 112 (H) 70 - 99 mg/dL   BUN 13 6 - 23 mg/dL   Creatinine, Ser 0.54 0.40 - 1.20 mg/dL   Total Bilirubin 0.4 0.2 - 1.2 mg/dL   Alkaline Phosphatase 58 39 - 117 U/L   AST 14 0 - 37 U/L   ALT 11 0 - 35 U/L   Total Protein 6.8 6.0 - 8.3 g/dL   Albumin 4.1 3.5 - 5.2 g/dL   Calcium 9.0 8.4 - 10.5 mg/dL   GFR 114.52 >60.00 mL/min  CBC with Differential/Platelet     Status: None   Collection Time: 08/29/18  1:30 PM  Result Value Ref Range   WBC 6.7 4.0 - 10.5 K/uL   RBC 4.46 3.87 - 5.11 Mil/uL   Hemoglobin 12.0 12.0 - 15.0 g/dL   HCT 36.3 36.0 - 46.0 %   MCV 81.3 78.0 - 100.0 fl   MCHC 33.0 30.0 - 36.0 g/dL   RDW 14.8 11.5 - 15.5 %   Platelets 272.0 150.0 - 400.0 K/uL   Neutrophils Relative % 56.2 43.0 - 77.0 %   Lymphocytes Relative 32.2 12.0 - 46.0 %   Monocytes Relative 8.4 3.0 - 12.0 %   Eosinophils Relative 2.8 0.0 - 5.0 %   Basophils Relative 0.4 0.0 - 3.0 %   Neutro Abs 3.8 1.4 - 7.7 K/uL   Lymphs Abs 2.2 0.7 - 4.0 K/uL  Monocytes Absolute 0.6 0.1 - 1.0 K/uL   Eosinophils Absolute 0.2 0.0 - 0.7 K/uL   Basophils Absolute 0.0 0.0 - 0.1 K/uL  Magnesium     Status: None   Collection Time: 08/29/18  1:30 PM  Result Value Ref Range   Magnesium 2.3 1.5 - 2.5 mg/dL  Hemoglobin A1c     Status: None   Collection Time: 08/29/18  1:30 PM  Result Value Ref Range   Hgb A1c MFr Bld 5.7 4.6 - 6.5 %    Comment: Glycemic Control Guidelines for People with Diabetes:Non Diabetic:  <6%Goal of Therapy: <7%Additional Action Suggested:  >8%   Lipid panel     Status: Abnormal   Collection Time: 08/29/18  1:30 PM  Result Value Ref Range   Cholesterol 183 0 - 200 mg/dL    Comment: ATP III Classification       Desirable:  < 200 mg/dL               Borderline High:  200 - 239 mg/dL          High:  > = 240 mg/dL   Triglycerides 123.0 0.0 - 149.0 mg/dL    Comment: Normal:  <150 mg/dLBorderline High:   150 - 199 mg/dL   HDL 57.20 >39.00 mg/dL   VLDL 24.6 0.0 - 40.0 mg/dL   LDL Cholesterol 102 (H) 0 - 99 mg/dL   Total CHOL/HDL Ratio 3     Comment:                Men          Women1/2 Average Risk     3.4          3.3Average Risk          5.0          4.42X Average Risk          9.6          7.13X Average Risk          15.0          11.0                       NonHDL 126.28     Comment: NOTE:  Non-HDL goal should be 30 mg/dL higher than patient's LDL goal (i.e. LDL goal of < 70 mg/dL, would have non-HDL goal of < 100 mg/dL)   Objective  Body mass index is 33.91 kg/m. Wt Readings from Last 3 Encounters:  09/20/18 185 lb 6.4 oz (84.1 kg)  08/29/18 188 lb 3.2 oz (85.4 kg)  08/26/18 200 lb (90.7 kg)   Temp Readings from Last 3 Encounters:  09/20/18 98.9 F (37.2 C) (Oral)  08/29/18 98.2 F (36.8 C) (Oral)  08/26/18 98.5 F (36.9 C) (Oral)   BP Readings from Last 3 Encounters:  09/20/18 130/66  08/29/18 (!) 142/64  06/30/18 (!) 123/59   Pulse Readings from Last 3 Encounters:  09/20/18 80  08/29/18 79  08/26/18 79    Physical Exam  Constitutional: She is oriented to person, place, and time. Vital signs are normal. She appears well-developed and well-nourished. She is cooperative.  HENT:  Head: Normocephalic and atraumatic.  Right Ear: Hearing, tympanic membrane and external ear normal.  Left Ear: Hearing, tympanic membrane and external ear normal.  Nose: Nose normal.  Mouth/Throat: Mucous membranes are normal. Oropharyngeal exudate, posterior oropharyngeal edema and posterior oropharyngeal erythema present.    Eyes: Pupils are equal,  round, and reactive to light. Conjunctivae are normal.  Cardiovascular: Normal rate, regular rhythm and normal heart sounds.  Pulmonary/Chest: Effort normal and breath sounds normal.  Abdominal: Soft. Bowel sounds are normal.  Neurological: She is alert and oriented to person, place, and time. Gait normal.  Skin: Skin is warm, dry and intact.   Psychiatric: She has a normal mood and affect. Her speech is normal and behavior is normal. Judgment and thought content normal. Cognition and memory are normal.  Nursing note and vitals reviewed.   Assessment   1. Sore throat 2/2 flu A, strep negative  2. Right shoulder limited ROM s/p surgery ortho 3. HM Plan  1. Tamiflu bid x 5 days, augmentin  Supportive care/cold handout given  Call by Friday to let know how doing  2. F/u 09/22/18 with Dr. Roland Rack  3.  UTD vaccines (I.e prevnar, Tdap had 06/27/12) -flu shot given had 2019  rec shingrix vaccine  pna 23 at f/u   Colonoscopy had 12/24/13 consider cologuard if candidate in future  mammo had 7/24/18negre ordered 08/2018 not had done yet  Out of pap windows/p hysterectomy then ovaries removed in 2002 DEXA6/2/16osteopenia on vit D nl vit D will rec calcium 600 mg bid in future -consider repeat DEXA in future   Ortho saw 10/20/18 rec cont PT and exercise f/u in 2-3 weeks Dr. Roland Rack Ortho Dr. Roland Rack saw 11/03/18 PT rec continue f/u in 2 months  Provider: Dr. Olivia Mackie McLean-Scocuzza-Internal Medicine

## 2018-09-25 DIAGNOSIS — Z96611 Presence of right artificial shoulder joint: Secondary | ICD-10-CM | POA: Diagnosis not present

## 2018-09-25 DIAGNOSIS — M25511 Pain in right shoulder: Secondary | ICD-10-CM | POA: Diagnosis not present

## 2018-09-28 DIAGNOSIS — M25511 Pain in right shoulder: Secondary | ICD-10-CM | POA: Diagnosis not present

## 2018-09-28 DIAGNOSIS — Z96611 Presence of right artificial shoulder joint: Secondary | ICD-10-CM | POA: Diagnosis not present

## 2018-10-03 DIAGNOSIS — M25511 Pain in right shoulder: Secondary | ICD-10-CM | POA: Diagnosis not present

## 2018-10-03 DIAGNOSIS — Z96611 Presence of right artificial shoulder joint: Secondary | ICD-10-CM | POA: Diagnosis not present

## 2018-10-06 DIAGNOSIS — M25511 Pain in right shoulder: Secondary | ICD-10-CM | POA: Diagnosis not present

## 2018-10-06 DIAGNOSIS — Z96611 Presence of right artificial shoulder joint: Secondary | ICD-10-CM | POA: Diagnosis not present

## 2018-10-10 DIAGNOSIS — Z96611 Presence of right artificial shoulder joint: Secondary | ICD-10-CM | POA: Diagnosis not present

## 2018-10-10 DIAGNOSIS — M25511 Pain in right shoulder: Secondary | ICD-10-CM | POA: Diagnosis not present

## 2018-10-13 DIAGNOSIS — Z96611 Presence of right artificial shoulder joint: Secondary | ICD-10-CM | POA: Diagnosis not present

## 2018-10-13 DIAGNOSIS — M25511 Pain in right shoulder: Secondary | ICD-10-CM | POA: Diagnosis not present

## 2018-10-17 DIAGNOSIS — M25511 Pain in right shoulder: Secondary | ICD-10-CM | POA: Diagnosis not present

## 2018-10-17 DIAGNOSIS — Z96611 Presence of right artificial shoulder joint: Secondary | ICD-10-CM | POA: Diagnosis not present

## 2018-10-20 DIAGNOSIS — Z96611 Presence of right artificial shoulder joint: Secondary | ICD-10-CM | POA: Diagnosis not present

## 2018-10-20 DIAGNOSIS — M19011 Primary osteoarthritis, right shoulder: Secondary | ICD-10-CM | POA: Diagnosis not present

## 2018-10-20 DIAGNOSIS — M25511 Pain in right shoulder: Secondary | ICD-10-CM | POA: Diagnosis not present

## 2018-10-24 DIAGNOSIS — Z96611 Presence of right artificial shoulder joint: Secondary | ICD-10-CM | POA: Diagnosis not present

## 2018-10-24 DIAGNOSIS — M25511 Pain in right shoulder: Secondary | ICD-10-CM | POA: Diagnosis not present

## 2018-10-27 DIAGNOSIS — M25511 Pain in right shoulder: Secondary | ICD-10-CM | POA: Diagnosis not present

## 2018-10-27 DIAGNOSIS — M26609 Unspecified temporomandibular joint disorder, unspecified side: Secondary | ICD-10-CM | POA: Diagnosis not present

## 2018-10-27 DIAGNOSIS — H9209 Otalgia, unspecified ear: Secondary | ICD-10-CM | POA: Diagnosis not present

## 2018-10-27 DIAGNOSIS — G501 Atypical facial pain: Secondary | ICD-10-CM | POA: Diagnosis not present

## 2018-10-27 DIAGNOSIS — M1990 Unspecified osteoarthritis, unspecified site: Secondary | ICD-10-CM | POA: Diagnosis not present

## 2018-10-27 DIAGNOSIS — Z96611 Presence of right artificial shoulder joint: Secondary | ICD-10-CM | POA: Diagnosis not present

## 2018-10-31 ENCOUNTER — Encounter: Payer: Self-pay | Admitting: Internal Medicine

## 2018-10-31 DIAGNOSIS — Z96611 Presence of right artificial shoulder joint: Secondary | ICD-10-CM | POA: Diagnosis not present

## 2018-10-31 DIAGNOSIS — M25511 Pain in right shoulder: Secondary | ICD-10-CM | POA: Diagnosis not present

## 2018-11-02 DIAGNOSIS — M25511 Pain in right shoulder: Secondary | ICD-10-CM | POA: Diagnosis not present

## 2018-11-02 DIAGNOSIS — Z96611 Presence of right artificial shoulder joint: Secondary | ICD-10-CM | POA: Diagnosis not present

## 2018-11-03 DIAGNOSIS — Z96611 Presence of right artificial shoulder joint: Secondary | ICD-10-CM | POA: Diagnosis not present

## 2018-11-07 ENCOUNTER — Other Ambulatory Visit: Payer: Self-pay | Admitting: Internal Medicine

## 2018-11-07 DIAGNOSIS — E785 Hyperlipidemia, unspecified: Secondary | ICD-10-CM

## 2018-11-07 MED ORDER — PRAVASTATIN SODIUM 20 MG PO TABS: 20.0000 mg | ORAL_TABLET | Freq: Every evening | ORAL | 3 refills | Status: DC

## 2018-11-09 ENCOUNTER — Ambulatory Visit: Payer: Medicare Other

## 2018-11-15 ENCOUNTER — Ambulatory Visit (INDEPENDENT_AMBULATORY_CARE_PROVIDER_SITE_OTHER): Payer: Medicare Other

## 2018-11-15 VITALS — BP 130/62 | HR 61 | Temp 98.1°F | Resp 16 | Ht 61.0 in | Wt 188.0 lb

## 2018-11-15 DIAGNOSIS — Z0000 Encounter for general adult medical examination without abnormal findings: Secondary | ICD-10-CM

## 2018-11-15 NOTE — Progress Notes (Signed)
Agree with note   tMS 
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
## 2018-11-15 NOTE — Patient Instructions (Addendum)
  Tina Howard , Thank you for taking time to come for your Medicare Wellness Visit. I appreciate your ongoing commitment to your health goals. Please review the following plan we discussed and let me know if I can assist you in the future.   Keep all routine maintenance appointments.  These are the goals we discussed: Goals    . DIET - INCREASE LEAN PROTEINS     Low carb diet    . Increase physical activity     Continued physical therapy as directed.       This is a list of the screening recommended for you and due dates:  Health Maintenance  Topic Date Due  . Tetanus Vaccine  06/27/2022  . Flu Shot  Completed  . DEXA scan (bone density measurement)  Completed  . Pneumonia vaccines  Completed

## 2018-11-17 DIAGNOSIS — Z96611 Presence of right artificial shoulder joint: Secondary | ICD-10-CM | POA: Diagnosis not present

## 2018-11-17 DIAGNOSIS — M25511 Pain in right shoulder: Secondary | ICD-10-CM | POA: Diagnosis not present

## 2018-11-21 ENCOUNTER — Ambulatory Visit (INDEPENDENT_AMBULATORY_CARE_PROVIDER_SITE_OTHER): Payer: Medicare Other | Admitting: Podiatry

## 2018-11-21 ENCOUNTER — Encounter: Payer: Self-pay | Admitting: Podiatry

## 2018-11-21 DIAGNOSIS — L989 Disorder of the skin and subcutaneous tissue, unspecified: Secondary | ICD-10-CM

## 2018-11-21 DIAGNOSIS — M79609 Pain in unspecified limb: Secondary | ICD-10-CM | POA: Diagnosis not present

## 2018-11-21 DIAGNOSIS — B351 Tinea unguium: Secondary | ICD-10-CM | POA: Diagnosis not present

## 2018-11-21 DIAGNOSIS — M25511 Pain in right shoulder: Secondary | ICD-10-CM | POA: Diagnosis not present

## 2018-11-21 DIAGNOSIS — Z96611 Presence of right artificial shoulder joint: Secondary | ICD-10-CM | POA: Diagnosis not present

## 2018-11-22 NOTE — Progress Notes (Signed)
    Subjective: Patient is a 84 y.o. female presenting to the office today with a chief complaint of a painful callus lesions to the left foot that has been present for several months. Bearing weight increases the pain. She has not done anything at home for treatment.   Patient also complains of elongated, thickened nails that cause pain while ambulating in shoes. She is unable to trim her own nails. Patient presents today for further treatment and evaluation.  Past Medical History:  Diagnosis Date  . Allergic rhinitis   . Arthritis   . CHF (congestive heart failure) ([place]) 04/2016   developed 2 weeks after identifying AF  . Complication of anesthesia   . Dysrhythmia 04/2016   a fib    . GERD (gastroesophageal reflux disease)    occasionally has reflux/heartburn  . History of pelvic mass    s/p resection, benign  . Hypercholesteremia   . Hypertension   . Mitral regurgitation    noted echo 04/2016   . Osteoporosis    per pt report on Medical Screening Form   . PONV (postoperative nausea and vomiting)   . Pyelonephritis    remote hx   . Shortness of breath dyspnea   . Varicella zoster 2007    Objective:  Physical Exam General: Alert and oriented x3 in no acute distress  Dermatology: Hyperkeratotic lesion present on the left sub-fourth MPJ. Pain on palpation with a central nucleated core noted. Skin is warm, dry and supple bilateral lower extremities. Negative for open lesions or macerations. Nails are tender, long, thickened and dystrophic with subungual debris, consistent with onychomycosis, 1-5 bilateral. No signs of infection noted.  Vascular: Palpable pedal pulses bilaterally. No edema or erythema noted. Capillary refill within normal limits.  Neurological: Epicritic and protective threshold grossly intact bilaterally.   Musculoskeletal Exam: Pain on palpation at the keratotic lesion noted. Range of motion within normal limits bilateral. Muscle strength 5/5 in all groups  bilateral.  Assessment: 1. Onychodystrophic nails 1-5 bilateral with hyperkeratosis of nails.  2. Onychomycosis of nail due to dermatophyte bilateral 3. Pre-ulcerative callus lesion noted to the left sub-fourth MPJ   Plan of Care:  1. Patient evaluated. 2. Excisional debridement of keratoic lesion using a chisel blade was performed without incident.  3. Dressed with light dressing. 4. Mechanical debridement of nails 1-5 bilaterally performed using a nail nipper. Filed with dremel without incident.  5. Patient is to return to the clinic in 3 months.   Edrick Kins, DPM [facility]  Dr. Edrick Kins, [address]                                        [place], [place] [address]                Office [phone]  Fax [phone]

## 2018-11-28 ENCOUNTER — Ambulatory Visit (INDEPENDENT_AMBULATORY_CARE_PROVIDER_SITE_OTHER): Payer: Medicare Other | Admitting: Internal Medicine

## 2018-11-28 ENCOUNTER — Encounter: Payer: Self-pay | Admitting: Internal Medicine

## 2018-11-28 VITALS — BP 122/74 | HR 75 | Temp 98.3°F | Ht 61.0 in | Wt 188.8 lb

## 2018-11-28 DIAGNOSIS — E876 Hypokalemia: Secondary | ICD-10-CM | POA: Diagnosis not present

## 2018-11-28 DIAGNOSIS — R011 Cardiac murmur, unspecified: Secondary | ICD-10-CM

## 2018-11-28 DIAGNOSIS — Z96611 Presence of right artificial shoulder joint: Secondary | ICD-10-CM | POA: Diagnosis not present

## 2018-11-28 DIAGNOSIS — I480 Paroxysmal atrial fibrillation: Secondary | ICD-10-CM | POA: Diagnosis not present

## 2018-11-28 DIAGNOSIS — R7303 Prediabetes: Secondary | ICD-10-CM | POA: Diagnosis not present

## 2018-11-28 DIAGNOSIS — I10 Essential (primary) hypertension: Secondary | ICD-10-CM

## 2018-11-28 DIAGNOSIS — E785 Hyperlipidemia, unspecified: Secondary | ICD-10-CM

## 2018-11-28 DIAGNOSIS — Z1283 Encounter for screening for malignant neoplasm of skin: Secondary | ICD-10-CM | POA: Diagnosis not present

## 2018-11-28 MED ORDER — POTASSIUM CHLORIDE CRYS ER 20 MEQ PO TBCR: 20.0000 meq | EXTENDED_RELEASE_TABLET | Freq: Two times a day (BID) | ORAL | 0 refills | Status: DC

## 2018-11-28 NOTE — Progress Notes (Signed)
Pre visit review using our clinic review tool, if applicable. No additional management support is needed unless otherwise documented below in the visit note. 

## 2018-11-28 NOTE — Patient Instructions (Addendum)
Try Tylenol as needed  Call me back when ready for mammogram and bone density  Call back when ready to refill potassium pills  Refer Dr. Raliegh Ip dermatology   Arthritis Arthritis is a term that is commonly used to refer to joint pain or joint disease. There are more than 100 types of arthritis. What are the causes? The most common cause of this condition is wear and tear of a joint. Other causes include:  Gout.  Inflammation of a joint.  An infection of a joint.  Sprains and other injuries near the joint.  A drug reaction or allergic reaction.  In some cases, the cause may not be known. What are the signs or symptoms? The main symptom of this condition is pain in the joint with movement. Other symptoms include:  Redness, swelling, or stiffness at a joint.  Warmth coming from the joint.  Fever.  Overall feeling of illness.  How is this diagnosed? This condition may be diagnosed with a physical exam and tests, including:  Blood tests.  Urine tests.  Imaging tests, such as MRI, X-rays, or a CT scan.  Sometimes, fluid is removed from a joint for testing. How is this treated? Treatment for this condition may involve:  Treatment of the cause, if it is known.  Rest.  Raising (elevating) the joint.  Applying cold or hot packs to the joint.  Medicines to improve symptoms and reduce inflammation.  Injections of a steroid such as cortisone into the joint to help reduce pain and inflammation.  Depending on the cause of your arthritis, you may need to make lifestyle changes to reduce stress on your joint. These changes may include exercising more and losing weight. Follow these instructions at home: Medicines  Take over-the-counter and prescription medicines only as told by your health care provider.  Do not take aspirin to relieve pain if gout is suspected. Activity  Rest your joint if told by your health care provider. Rest is important when your disease is active and  your joint feels painful, swollen, or stiff.  Avoid activities that make the pain worse. It is important to balance activity with rest.  Exercise your joint regularly with range-of-motion exercises as told by your health care provider. Try doing low-impact exercise, such as: ? Swimming. ? Water aerobics. ? Biking. ? Walking. Joint Care   If your joint is swollen, keep it elevated if told by your health care provider.  If your joint feels stiff in the morning, try taking a warm shower.  If directed, apply heat to the joint. If you have diabetes, do not apply heat without permission from your health care provider. ? Put a towel between the joint and the hot pack or heating pad. ? Leave the heat on the area for 20-30 minutes.  If directed, apply ice to the joint: ? Put ice in a plastic bag. ? Place a towel between your skin and the bag. ? Leave the ice on for 20 minutes, 2-3 times per day.  Keep all follow-up visits as told by your health care provider. This is important. Contact a health care provider if:  The pain gets worse.  You have a fever. Get help right away if:  You develop severe joint pain, swelling, or redness.  Many joints become painful and swollen.  You develop severe back pain.  You develop severe weakness in your leg.  You cannot control your bladder or bowels. This information is not intended to replace advice given to  you by your health care provider. Make sure you discuss any questions you have with your health care provider. Document Released: 01/20/2005 Document Revised: 05/20/2016 Document Reviewed: 03/10/2015 Elsevier Interactive Patient Education  2018 [employer].  Hypokalemia Hypokalemia means that the amount of potassium in the blood is lower than normal.Potassium is a chemical that helps regulate the amount of fluid in the body (electrolyte). It also stimulates muscle tightening (contraction) and helps nerves work properly.Normally, most of  the body's potassium is inside of cells, and only a very small amount is in the blood. Because the amount in the blood is so small, minor changes to potassium levels in the blood can be life-threatening. What are the causes? This condition may be caused by:  Antibiotic medicine.  Diarrhea or vomiting. Taking too much of a medicine that helps you have a bowel movement (laxative) can cause diarrhea and lead to hypokalemia.  Chronic kidney disease (CKD).  Medicines that help the body get rid of excess fluid (diuretics).  Eating disorders, such as bulimia.  Low magnesium levels in the body.  Sweating a lot.  What are the signs or symptoms? Symptoms of this condition include:  Weakness.  Constipation.  Fatigue.  Muscle cramps.  Mental confusion.  Skipped heartbeats or irregular heartbeat (palpitations).  Tingling or numbness.  How is this diagnosed? This condition is diagnosed with a blood test. How is this treated? Hypokalemia can be treated by taking potassium supplements by mouth or adjusting the medicines that you take. Treatment may also include eating more foods that contain a lot of potassium. If your potassium level is very low, you may need to get potassium through an IV tube in one of your veins and be monitored in the hospital. Follow these instructions at home:  Take over-the-counter and prescription medicines only as told by your health care provider. This includes vitamins and supplements.  Eat a healthy diet. A healthy diet includes fresh fruits and vegetables, whole grains, healthy fats, and lean proteins.  If instructed, eat more foods that contain a lot of potassium, such as: ? Nuts, such as peanuts and pistachios. ? Seeds, such as sunflower seeds and pumpkin seeds. ? Peas, lentils, and lima beans. ? Whole grain and bran cereals and breads. ? Fresh fruits and vegetables, such as apricots, avocado, bananas, cantaloupe, kiwi, oranges, tomatoes, asparagus,  and potatoes. ? Orange juice. ? Tomato juice. ? Red meats. ? Yogurt.  Keep all follow-up visits as told by your health care provider. This is important. Contact a health care provider if:  You have weakness that gets worse.  You feel your heart pounding or racing.  You vomit.  You have diarrhea.  You have diabetes (diabetes mellitus) and you have trouble keeping your blood sugar (glucose) in your target range. Get help right away if:  You have chest pain.  You have shortness of breath.  You have vomiting or diarrhea that lasts for more than 2 days.  You faint. This information is not intended to replace advice given to you by your health care provider. Make sure you discuss any questions you have with your health care provider. Document Released: 12/13/2005 Document Revised: 07/31/2016 Document Reviewed: 07/31/2016 Elsevier Interactive Patient Education  2018 [employer].

## 2018-11-28 NOTE — Progress Notes (Signed)
Chief Complaint  Patient presents with  . Follow-up   F/u  1. Reviewed labs 08/2018 prediabetes 5.7 2. HTN controlled on diltiazem 120 mg qd, lasix 40 mg, hydrazaline 25 tid, losartan 100 mg qd  3. Afib will f/u cards 02/13/2019 Dr. Nehemiah Massed  4. Right shoulder limited ROM since surgery and doing PT weekly will f/u with Dr. Roland Rack 12/2018   Review of Systems  Constitutional: Negative for weight loss.  HENT: Negative for hearing loss.   Eyes: Negative for blurred vision.  Respiratory: Negative for shortness of breath.   Cardiovascular: Negative for chest pain.  Gastrointestinal: Negative for abdominal pain.  Musculoskeletal: Positive for joint pain.  Skin: Negative for rash.  Neurological: Negative for headaches.  Psychiatric/Behavioral: Negative for depression.   Past Medical History:  Diagnosis Date  . Allergic rhinitis   . Arthritis   . CHF (congestive heart failure) ([place]) 04/2016   developed 2 weeks after identifying AF  . Complication of anesthesia   . Dysrhythmia 04/2016   a fib    . GERD (gastroesophageal reflux disease)    occasionally has reflux/heartburn  . History of pelvic mass    s/p resection, benign  . Hypercholesteremia   . Hypertension   . Mitral regurgitation    noted echo 04/2016   . Osteoporosis    per pt report on Medical Screening Form   . PONV (postoperative nausea and vomiting)   . Pyelonephritis    remote hx   . Shortness of breath dyspnea   . Varicella zoster 2007   Past Surgical History:  Procedure Laterality Date  . [place]   lower back  . bladder prolapse repair    . BREAST CYST EXCISION Left    1990s  . CATARACT EXTRACTION Bilateral 2014   Dr. Sandra Cockayne  . ELECTROPHYSIOLOGIC STUDY N/A 06/15/2016   Procedure: CARDIOVERSION;  Surgeon: [name], MD;  Location: [facility] ORS;  Service: Cardiovascular;  Laterality: N/A;  . EYE SURGERY     bilateral cataract  . FOOT SURGERY    . HERNIA REPAIR  2013   inguinal  . JOINT  REPLACEMENT  [id number]   bilateral knee replacements  . KNEE ARTHROSCOPY     right  . RECTOCELE REPAIR    . REVERSE SHOULDER ARTHROPLASTY Right 06/27/2018   Procedure: REVERSE SHOULDER ARTHROPLASTY;  Surgeon: Corky Mull, MD;  Location: [facility] ORS;  Service: Orthopedics;  Laterality: Right;  . TOTAL KNEE ARTHROPLASTY     left  . VAGINAL HYSTERECTOMY  1970   benign reasons   Family History  Problem Relation Age of Onset  . Emphysema Mother   . Heart disease Father   . Breast cancer Maternal Aunt 80   Social History   Socioeconomic History  . Marital status: Widowed    Spouse name: Not on file  . Number of children: Not on file  . Years of education: Not on file  . Highest education level: Not on file  Occupational History  . Not on file  Social Needs  . Financial resource strain: Not hard at all  . Food insecurity:    Worry: Never true    Inability: Never true  . Transportation needs:    Medical: No    Non-medical: No  Tobacco Use  . Smoking status: Never Smoker  . Smokeless tobacco: Never Used  Substance and Sexual Activity  . Alcohol use: Yes    Alcohol/week: 2.0 standard drinks    Types: 2 Glasses of wine  per week    Comment: occassional  . Drug use: No  . Sexual activity: Never  Lifestyle  . Physical activity:    Days per week: 2 days    Minutes per session: 60 min  . Stress: Not on file  Relationships  . Social connections:    Talks on phone: Not on file    Gets together: Not on file    Attends religious service: Not on file    Active member of club or organization: Not on file    Attends meetings of clubs or organizations: Not on file    Relationship status: Widowed  . Intimate partner violence:    Fear of current or ex partner: Not on file    Emotionally abused: Not on file    Physically abused: Not on file    Forced sexual activity: Not on file  Other Topics Concern  . Not on file  Social History Narrative   Widowed.   Very active within her  community.   Enjoys singing, being active in her church, spending time with family.   Lives with son   Current Meds  Medication Sig  . acetaminophen (TYLENOL) 500 MG tablet Take 500-1,000 mg by mouth 2 (two) times daily as needed for moderate pain or headache.  . Cholecalciferol (VITAMIN D3) 2000 units capsule Take 1,000 Units by mouth daily.   . cyclobenzaprine (FLEXERIL) 5 MG tablet Take 5 mg by mouth 2 (two) times daily.  . diclofenac sodium (VOLTAREN) 1 % GEL Apply 1 application topically as needed (foot pain).   Marland Kitchen diltiazem (TIAZAC) 120 MG 24 hr capsule Take 120 mg by mouth once daily at night  . docusate calcium (SURFAK) 240 MG capsule Take 240 mg by mouth at bedtime.   . flecainide (TAMBOCOR) 50 MG tablet Take 1 tablet (50 mg total) by mouth 2 (two) times daily.  . furosemide (LASIX) 40 MG tablet Take 40 mg by mouth daily.   . hydrALAZINE (APRESOLINE) 25 MG tablet Take 1 tablet (25 mg total) by mouth 3 (three) times daily. (Patient taking differently: Take 25 mg by mouth 2 (two) times daily. )  . losartan (COZAAR) 100 MG tablet Take 1 tablet (100 mg total) by mouth daily.  . ondansetron (ZOFRAN) 4 MG tablet Take 1 tablet (4 mg total) by mouth every 6 (six) hours as needed for nausea.  Marland Kitchen oseltamivir (TAMIFLU) 75 MG capsule Take 1 capsule (75 mg total) by mouth 2 (two) times daily.  . pravastatin (PRAVACHOL) 20 MG tablet Take 1 tablet (20 mg total) by mouth every evening.  . rivaroxaban (XARELTO) 20 MG TABS tablet Take 1 tablet (20 mg total) by mouth daily with supper.  Marland Kitchen scopolamine (TRANSDERM-SCOP) 1 MG/3DAYS Place 1 patch (1.5 mg total) onto the skin every 3 (three) days.   Allergies  Allergen Reactions  . Amlodipine Swelling    Ankle swelling  . Azo Urinary Tract Support Other (See Comments)    Skin turned orange  . Erythromycin Nausea And Vomiting   Recent Results (from the past 2160 hour(s))  POC Influenza A&B (Binax test)     Status: Abnormal   Collection Time: 09/20/18  11:58 AM  Result Value Ref Range   Influenza A, POC Positive (A) Negative   Influenza B, POC Negative Negative  POCT rapid strep A     Status: None   Collection Time: 09/20/18 11:59 AM  Result Value Ref Range   Rapid Strep A Screen Negative Negative   Objective  Body mass index is 35.67 kg/m. Wt Readings from Last 3 Encounters:  11/28/18 188 lb 12.8 oz (85.6 kg)  11/15/18 188 lb (85.3 kg)  09/20/18 185 lb 6.4 oz (84.1 kg)   Temp Readings from Last 3 Encounters:  11/28/18 98.3 F (36.8 C) (Oral)  11/15/18 98.1 F (36.7 C) (Oral)  09/20/18 98.9 F (37.2 C) (Oral)   BP Readings from Last 3 Encounters:  11/28/18 122/74  11/15/18 130/62  09/20/18 130/66   Pulse Readings from Last 3 Encounters:  11/28/18 75  11/15/18 61  09/20/18 80    Physical Exam  Constitutional: She is oriented to person, place, and time. Vital signs are normal. She appears well-developed and well-nourished. She is cooperative.  HENT:  Head: Normocephalic and atraumatic.  Mouth/Throat: Oropharynx is clear and moist and mucous membranes are normal.  Eyes: Pupils are equal, round, and reactive to light. Conjunctivae are normal.  Cardiovascular: Normal rate and regular rhythm.  Murmur heard. In NSR  Pulmonary/Chest: Effort normal and breath sounds normal.  Neurological: She is alert and oriented to person, place, and time. Gait normal.  Skin: Skin is warm, dry and intact.  Psychiatric: She has a normal mood and affect. Her speech is normal and behavior is normal. Judgment and thought content normal. Cognition and memory are normal.  Nursing note and vitals reviewed.   Assessment   1. HTN/HLD  2. Prediabetes  3.Afib now in NSR, cardiac murmur  4. Right shoulder limited ROM s/p surgery reverse total shoulder surgery 06/2018 Dr. Roland Rack  5. HM Plan   1. Cont meds  With hypoK will refill Kdur 20 mg bid, given list of high K foods  2. Healthy diet choices and exercise  3. F/u Dr. Raliegh Ip 02/13/19 4. PT  monthly and f/u Dr. Roland Rack 12/2018   Ortho Dr. Roland Rack saw 11/03/18 PT rec continue f/u in 2 months  5.  UTD vaccines(I.e prevnar, Tdap had 06/27/12) -flu shot given had 08/29/2018  rec shingrix vaccine pna 23 at f/u  Colonoscopy had 12/24/13  -consider cologuard if candidate in future  mammo had 7/24/18[place] ordered 08/2018 not had done yet wants to hold on mammo and DEXA until shoulder improves  Out of pap windows/p hysterectomy then ovaries removed in 2002 DEXA6/2/16osteopenia on vit D nl vit D will rec calcium 600 mg bid in future -consider repeat DEXA in future pt wants to hold see above  refer dermatology today tbse Dr. Raliegh Ip   Provider: Dr. Olivia Mackie McLean-Scocuzza-Internal Medicine

## 2018-11-29 DIAGNOSIS — Z96611 Presence of right artificial shoulder joint: Secondary | ICD-10-CM | POA: Diagnosis not present

## 2018-11-29 DIAGNOSIS — M25511 Pain in right shoulder: Secondary | ICD-10-CM | POA: Diagnosis not present

## 2018-12-05 DIAGNOSIS — Z96611 Presence of right artificial shoulder joint: Secondary | ICD-10-CM | POA: Diagnosis not present

## 2018-12-05 DIAGNOSIS — M25511 Pain in right shoulder: Secondary | ICD-10-CM | POA: Diagnosis not present

## 2018-12-07 ENCOUNTER — Telehealth: Payer: Self-pay | Admitting: Internal Medicine

## 2018-12-07 ENCOUNTER — Other Ambulatory Visit: Payer: Self-pay | Admitting: Internal Medicine

## 2018-12-07 DIAGNOSIS — T148XXA Other injury of unspecified body region, initial encounter: Secondary | ICD-10-CM

## 2018-12-07 MED ORDER — MUPIROCIN 2 % EX OINT: 1.0000 "application " | TOPICAL_OINTMENT | Freq: Two times a day (BID) | CUTANEOUS | 0 refills | Status: DC

## 2018-12-07 NOTE — Telephone Encounter (Signed)
Patient came into clinic with and abrasion to right forearm, patient hit her arm on Tuesday night around & PM , abrasion measure 1 x 1 CM  Round . Top layer of the dermis and epidermis.   Wound was cool to touch no redness noted clean. Advised patient to keep clean and dry , if wound needed cleaning to use sterile saline once daily and an dress using ABX ointment prescribed as directed on label. Nurse cleaned wound with sterile saline applied one non-stick telfa 4X3 dressing and wrapped with  3 inch coban to allow for lite compression to stop small amount of dark red drainage. Patient sat for 15 minutes no visible bleeding noted  , patient allowed to leave  Vitals as follows , BP 124/68 pulse 70 Temp 97.8 02 sat 98 and respirations 18.

## 2018-12-07 NOTE — Telephone Encounter (Signed)
rx bactroban forearm for abrasion and RN Juliann Pulse placed ACE wrap  [place]

## 2018-12-08 ENCOUNTER — Emergency Department
Admission: EM | Admit: 2018-12-08 | Discharge: 2018-12-08 | Disposition: A | Discharge status: Home / Self Care | Payer: Medicare Other | Attending: Emergency Medicine | Admitting: Emergency Medicine

## 2018-12-08 DIAGNOSIS — Y998 Other external cause status: Secondary | ICD-10-CM | POA: Diagnosis not present

## 2018-12-08 DIAGNOSIS — I509 Heart failure, unspecified: Secondary | ICD-10-CM | POA: Diagnosis not present

## 2018-12-08 DIAGNOSIS — I110 Hypertensive heart disease with heart failure: Secondary | ICD-10-CM | POA: Insufficient documentation

## 2018-12-08 DIAGNOSIS — Z79899 Other long term (current) drug therapy: Secondary | ICD-10-CM | POA: Insufficient documentation

## 2018-12-08 DIAGNOSIS — S51811A Laceration without foreign body of right forearm, initial encounter: Secondary | ICD-10-CM | POA: Diagnosis not present

## 2018-12-08 DIAGNOSIS — W268XXA Contact with other sharp object(s), not elsewhere classified, initial encounter: Secondary | ICD-10-CM | POA: Diagnosis not present

## 2018-12-08 DIAGNOSIS — Y9389 Activity, other specified: Secondary | ICD-10-CM | POA: Diagnosis not present

## 2018-12-08 DIAGNOSIS — Z96652 Presence of left artificial knee joint: Secondary | ICD-10-CM | POA: Diagnosis not present

## 2018-12-08 DIAGNOSIS — Z7902 Long term (current) use of antithrombotics/antiplatelets: Secondary | ICD-10-CM | POA: Insufficient documentation

## 2018-12-08 DIAGNOSIS — Z96651 Presence of right artificial knee joint: Secondary | ICD-10-CM | POA: Insufficient documentation

## 2018-12-08 DIAGNOSIS — Y929 Unspecified place or not applicable: Secondary | ICD-10-CM | POA: Insufficient documentation

## 2018-12-08 DIAGNOSIS — Z7901 Long term (current) use of anticoagulants: Secondary | ICD-10-CM | POA: Insufficient documentation

## 2018-12-08 DIAGNOSIS — S59911A Unspecified injury of right forearm, initial encounter: Secondary | ICD-10-CM | POA: Diagnosis present

## 2018-12-08 NOTE — ED Triage Notes (Signed)
Patient reports she cut her right forearm on Tuesday night. Patient is on [place] for Afib. Patient reports the wound/laceration began to bleed again yesterday. Patient saw [facility] yesterday and was prescribed Mupirocin yesterday and wrapped with a compression bandage. Patient reports the wound/laceration began to bleed again today when she removed the bandage.

## 2018-12-08 NOTE — ED Notes (Signed)
Skin tear noted to right arm, dressing applied.

## 2018-12-08 NOTE — ED Provider Notes (Signed)
[facility] Emergency Department Provider Note  ____________________________________________   First MD Initiated Contact with Patient 12/08/18 2146     (approximate)  I have reviewed the triage vital signs and the nursing notes.   HISTORY  Chief Complaint Laceration    HPI Tina Howard is a 84 y.o. female presents emergency department complaining of a skin tear to the right forearm.  States happened on Tuesday and was seen at her PCP who bandaged the area up and put bacitracin on the area.  She states that it had started to form a scab and bump scab off earlier today.  The arm began to bleed again.  SHe is taking Xarelto    Past Medical History:  Diagnosis Date  . Allergic rhinitis   . Arthritis   . CHF (congestive heart failure) ([place]) 04/2016   developed 2 weeks after identifying AF  . Complication of anesthesia   . Dysrhythmia 04/2016   a fib    . GERD (gastroesophageal reflux disease)    occasionally has reflux/heartburn  . History of pelvic mass    s/p resection, benign  . Hypercholesteremia   . Hypertension   . Mitral regurgitation    noted echo 04/2016   . Osteoporosis    per pt report on Medical Screening Form   . PONV (postoperative nausea and vomiting)   . Pyelonephritis    remote hx   . Shortness of breath dyspnea   . Varicella zoster 2007    Patient Active Problem List   Diagnosis Date Noted  . 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 HLD (hyperlipidemia) 12/29/2017  . SOB (shortness of breath) 12/29/2017  . Cardiac murmur 12/29/2017  . Mitral regurgitation 12/29/2017  . Osteopenia 12/01/2017  . Fall at home, subsequent encounter 08/02/2017  . First degree AV block 08/02/2017  . Paroxysmal A-fib ([place]) 05/26/2016  . Essential hypertension 05/26/2016  . Hypokalemia 05/26/2016  . Seasonal allergies 05/06/2016  . Obesity (BMI 30-39.9) 11/13/2014  . Back pain, chronic 05/08/2014  . Prediabetes  05/08/2014  . Screening for breast cancer 06/27/2012  . 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 Osteoarthrosis 08/20/2011    Past Surgical History:  Procedure Laterality Date  . [place]   lower back  . bladder prolapse repair    . BREAST CYST EXCISION Left    1990s  . CATARACT EXTRACTION Bilateral 2014   Dr. Sandra Cockayne  . ELECTROPHYSIOLOGIC STUDY N/A 06/15/2016   Procedure: CARDIOVERSION;  Surgeon: [name], MD;  Location: [facility] ORS;  Service: Cardiovascular;  Laterality: N/A;  . EYE SURGERY     bilateral cataract  . FOOT SURGERY    . HERNIA REPAIR  2013   inguinal  . JOINT REPLACEMENT  [id number]   bilateral knee replacements  . KNEE ARTHROSCOPY     right  . RECTOCELE REPAIR    . REVERSE SHOULDER ARTHROPLASTY Right 06/27/2018   Procedure: REVERSE SHOULDER ARTHROPLASTY;  Surgeon: Corky Mull, MD;  Location: [facility] ORS;  Service: Orthopedics;  Laterality: Right;  . TOTAL KNEE ARTHROPLASTY     left  . VAGINAL HYSTERECTOMY  1970   benign reasons    Prior to Admission medications   Medication Sig Start Date End Date Taking? Authorizing Provider  rivaroxaban (XARELTO) 20 MG TABS tablet Take 1 tablet (20 mg total) by mouth daily with supper. 03/06/18  Yes Burnard Hawthorne, FNP  acetaminophen (TYLENOL) 500 MG tablet Take 500-1,000 mg by  mouth 2 (two) times daily as needed for moderate pain or headache.    [provider]  Cholecalciferol (VITAMIN D3) 2000 units capsule Take 1,000 Units by mouth daily.     [provider]  diclofenac sodium (VOLTAREN) 1 % GEL Apply 1 application topically as needed (foot pain).     [provider]  diltiazem (TIAZAC) 120 MG 24 hr capsule Take 120 mg by mouth once daily at night 10/10/17   [provider]  docusate calcium (SURFAK) 240 MG capsule Take 240 mg by mouth at bedtime.     [provider]  flecainide (TAMBOCOR) 50 MG tablet Take 1 tablet (50 mg total) by  mouth 2 (two) times daily. 06/15/16   [name], MD  furosemide (LASIX) 40 MG tablet Take 40 mg by mouth daily.  07/25/17   [provider]  hydrALAZINE (APRESOLINE) 25 MG tablet Take 1 tablet (25 mg total) by mouth 3 (three) times daily. Patient taking differently: Take 25 mg by mouth 2 (two) times daily.  12/08/17 12/08/18  McLean-Scocuzza, Nino Glow, MD  losartan (COZAAR) 100 MG tablet Take 1 tablet (100 mg total) by mouth daily. 08/29/18   McLean-Scocuzza, Nino Glow, MD  mupirocin ointment (BACTROBAN) 2 % Apply 1 application topically 2 (two) times daily. X 1 week 12/07/18   McLean-Scocuzza, Nino Glow, MD  potassium chloride (K-DUR,KLOR-CON) 20 MEQ tablet Take 1 tablet (20 mEq total) by mouth 2 (two) times daily. 11/28/18   McLean-Scocuzza, Nino Glow, MD  pravastatin (PRAVACHOL) 20 MG tablet Take 1 tablet (20 mg total) by mouth every evening. 11/07/18   McLean-Scocuzza, Nino Glow, MD    Allergies Amlodipine; Azo urinary tract support; and Erythromycin  Family History  Problem Relation Age of Onset  . Emphysema Mother   . Heart disease Father   . Early death Father        died 51 y.o MI   . Breast cancer Maternal Aunt 80    Social History Social History   Tobacco Use  . Smoking status: Never Smoker  . Smokeless tobacco: Never Used  Substance Use Topics  . Alcohol use: Yes    Alcohol/week: 2.0 standard drinks    Types: 2 Glasses of wine per week    Comment: occassional  . Drug use: No    Review of Systems  Constitutional: No fever/chills Eyes: No visual changes. ENT: No sore throat. Respiratory: Denies cough Genitourinary: Negative for dysuria. Musculoskeletal: Negative for back pain. Skin: Negative for rash.  Positive skin tear to the right forearm    ____________________________________________   PHYSICAL EXAM:  VITAL SIGNS: ED Triage Vitals  Enc Vitals Group     BP 12/08/18 2130 (!) 177/80     Pulse Rate 12/08/18 2130 71     Resp 12/08/18 2130 18      Temp 12/08/18 2130 98.4 F (36.9 C)     Temp Source 12/08/18 2130 Oral     SpO2 12/08/18 2130 98 %     Weight 12/08/18 2130 184 lb (83.5 kg)     Height 12/08/18 2130 5\' 1"  (1.549 m)     Head Circumference --      Peak Flow --      Pain Score 12/08/18 2136 0     Pain Loc --      Pain Edu? --      Excl. in [place]? --     Constitutional: Alert and oriented. Well appearing and in no acute distress. Eyes: Conjunctivae  are normal.  Head: Atraumatic. Nose: No congestion/rhinnorhea. Mouth/Throat: Mucous membranes are moist.   Neck:  supple no lymphadenopathy noted Cardiovascular: Normal rate, regular rhythm.  Respiratory: Normal respiratory effort.  No retractions, GU: deferred Musculoskeletal: Decreased range of motion of the right shoulder due to recent surgery.  Right forearm has no bony tenderness. Neurologic:  Normal speech and language.  Skin:  Skin is warm, dry.  Positive for a braised area on the right forearm no active bleeding is noted.   Psychiatric: Mood and affect are normal. Speech and behavior are normal.  ____________________________________________   LABS (all labs ordered are listed, but only abnormal results are displayed)  Labs Reviewed - No data to display ____________________________________________   ____________________________________________  RADIOLOGY    ____________________________________________   PROCEDURES  Procedure(s) performed: No  Procedures    ____________________________________________   INITIAL IMPRESSION / ASSESSMENT AND PLAN / ED COURSE  Pertinent labs & imaging results that were available during my care of the patient were reviewed by me and considered in my medical decision making (see chart for details).   Patient is a 84 year old female presents emergency department with a skin tear to the right forearm  Physical exam shows an old skin tear with an open area.  No active bleeding is noted.  The area was repaired with  Dermabond.  Patient was given instructions on how to care for Dermabond.  She was discharged in stable condition of the care of her son.     As part of my medical decision making, I reviewed the following data within the [place] History obtained from family, Nursing notes reviewed and incorporated, Notes from prior ED visits and [place] Controlled Substance Database  ____________________________________________   FINAL CLINICAL IMPRESSION(S) / ED DIAGNOSES  Final diagnoses:  Skin tear of right forearm without complication, initial encounter      NEW MEDICATIONS STARTED DURING THIS VISIT:  New Prescriptions   No medications on file     Note:  This document was prepared using Dragon voice recognition software and may include unintentional dictation errors.    Versie Starks, PA-C 12/08/18 2219    Eula Listen, MD 12/08/18 [phone]

## 2018-12-12 DIAGNOSIS — M25511 Pain in right shoulder: Secondary | ICD-10-CM | POA: Diagnosis not present

## 2018-12-12 DIAGNOSIS — Z96611 Presence of right artificial shoulder joint: Secondary | ICD-10-CM | POA: Diagnosis not present

## 2018-12-19 DIAGNOSIS — Z96611 Presence of right artificial shoulder joint: Secondary | ICD-10-CM | POA: Diagnosis not present

## 2018-12-19 DIAGNOSIS — M25511 Pain in right shoulder: Secondary | ICD-10-CM | POA: Diagnosis not present

## 2018-12-26 DIAGNOSIS — Z96611 Presence of right artificial shoulder joint: Secondary | ICD-10-CM | POA: Diagnosis not present

## 2018-12-26 DIAGNOSIS — M25511 Pain in right shoulder: Secondary | ICD-10-CM | POA: Diagnosis not present

## 2019-01-05 DIAGNOSIS — M25511 Pain in right shoulder: Secondary | ICD-10-CM | POA: Diagnosis not present

## 2019-01-05 DIAGNOSIS — Z96611 Presence of right artificial shoulder joint: Secondary | ICD-10-CM | POA: Diagnosis not present

## 2019-02-01 DIAGNOSIS — D485 Neoplasm of uncertain behavior of skin: Secondary | ICD-10-CM | POA: Diagnosis not present

## 2019-02-01 DIAGNOSIS — D1800 Hemangioma unspecified site: Secondary | ICD-10-CM | POA: Diagnosis not present

## 2019-02-01 DIAGNOSIS — D225 Melanocytic nevi of trunk: Secondary | ICD-10-CM | POA: Diagnosis not present

## 2019-02-01 DIAGNOSIS — D2261 Melanocytic nevi of right upper limb, including shoulder: Secondary | ICD-10-CM | POA: Diagnosis not present

## 2019-02-01 DIAGNOSIS — L812 Freckles: Secondary | ICD-10-CM | POA: Diagnosis not present

## 2019-02-01 DIAGNOSIS — L570 Actinic keratosis: Secondary | ICD-10-CM | POA: Diagnosis not present

## 2019-02-01 DIAGNOSIS — Z1283 Encounter for screening for malignant neoplasm of skin: Secondary | ICD-10-CM | POA: Diagnosis not present

## 2019-02-01 DIAGNOSIS — L821 Other seborrheic keratosis: Secondary | ICD-10-CM | POA: Diagnosis not present

## 2019-02-01 DIAGNOSIS — L820 Inflamed seborrheic keratosis: Secondary | ICD-10-CM | POA: Diagnosis not present

## 2019-02-01 DIAGNOSIS — L578 Other skin changes due to chronic exposure to nonionizing radiation: Secondary | ICD-10-CM | POA: Diagnosis not present

## 2019-02-13 DIAGNOSIS — Z79899 Other long term (current) drug therapy: Secondary | ICD-10-CM | POA: Diagnosis not present

## 2019-02-13 DIAGNOSIS — Z5181 Encounter for therapeutic drug level monitoring: Secondary | ICD-10-CM | POA: Diagnosis not present

## 2019-02-13 DIAGNOSIS — E7849 Other hyperlipidemia: Secondary | ICD-10-CM | POA: Diagnosis not present

## 2019-02-13 DIAGNOSIS — I10 Essential (primary) hypertension: Secondary | ICD-10-CM | POA: Diagnosis not present

## 2019-02-13 DIAGNOSIS — I480 Paroxysmal atrial fibrillation: Secondary | ICD-10-CM | POA: Diagnosis not present

## 2019-02-13 DIAGNOSIS — I340 Nonrheumatic mitral (valve) insufficiency: Secondary | ICD-10-CM | POA: Diagnosis not present

## 2019-02-20 ENCOUNTER — Ambulatory Visit (INDEPENDENT_AMBULATORY_CARE_PROVIDER_SITE_OTHER): Payer: Medicare Other | Admitting: Podiatry

## 2019-02-20 ENCOUNTER — Encounter: Payer: Self-pay | Admitting: Podiatry

## 2019-02-20 ENCOUNTER — Ambulatory Visit: Payer: Medicare Other | Admitting: Podiatry

## 2019-02-20 DIAGNOSIS — M79676 Pain in unspecified toe(s): Secondary | ICD-10-CM | POA: Diagnosis not present

## 2019-02-20 DIAGNOSIS — L989 Disorder of the skin and subcutaneous tissue, unspecified: Secondary | ICD-10-CM

## 2019-02-20 DIAGNOSIS — B351 Tinea unguium: Secondary | ICD-10-CM | POA: Diagnosis not present

## 2019-02-20 DIAGNOSIS — M79609 Pain in unspecified limb: Principal | ICD-10-CM

## 2019-02-23 NOTE — Progress Notes (Signed)
    Subjective: Patient is a 85 y.o. female presenting to the office today with a chief complaint of a painful callus lesions to the left foot that has been present for several months. Bearing weight increases the pain. She has not had any recent treatment.   Patient also complains of elongated, thickened nails that cause pain while ambulating in shoes. She is unable to trim her own nails. Patient presents today for further treatment and evaluation.  Past Medical History:  Diagnosis Date  . Allergic rhinitis   . Arthritis   . CHF (congestive heart failure) ([place]) 04/2016   developed 2 weeks after identifying AF  . Complication of anesthesia   . Dysrhythmia 04/2016   a fib    . GERD (gastroesophageal reflux disease)    occasionally has reflux/heartburn  . History of pelvic mass    s/p resection, benign  . Hypercholesteremia   . Hypertension   . Mitral regurgitation    noted echo 04/2016   . Osteoporosis    per pt report on Medical Screening Form   . PONV (postoperative nausea and vomiting)   . Pyelonephritis    remote hx   . Shortness of breath dyspnea   . Varicella zoster 2007    Objective:  Physical Exam General: Alert and oriented x3 in no acute distress  Dermatology: Hyperkeratotic lesion present on the left sub-fourth MPJ. Pain on palpation with a central nucleated core noted. Skin is warm, dry and supple bilateral lower extremities. Negative for open lesions or macerations. Nails are tender, long, thickened and dystrophic with subungual debris, consistent with onychomycosis, 1-5 bilateral. No signs of infection noted.  Vascular: Palpable pedal pulses bilaterally. No edema or erythema noted. Capillary refill within normal limits.  Neurological: Epicritic and protective threshold grossly intact bilaterally.   Musculoskeletal Exam: Pain on palpation at the keratotic lesion noted. Range of motion within normal limits bilateral. Muscle strength 5/5 in all groups  bilateral.  Assessment: 1. Onychodystrophic nails 1-5 bilateral with hyperkeratosis of nails.  2. Onychomycosis of nail due to dermatophyte bilateral 3. Pre-ulcerative callus lesion noted to the left sub-fourth MPJ   Plan of Care:  1. Patient evaluated. 2. Excisional debridement of keratoic lesion using a chisel blade was performed without incident.  3. Dressed with light dressing. 4. Mechanical debridement of nails 1-5 bilaterally performed using a nail nipper. Filed with dremel without incident.  5. Patient is to return to the clinic in 3 months.   Edrick Kins, DPM [facility]  Dr. Edrick Kins, [address]                                        [place], [place] [address]                Office [phone]  Fax [phone]

## 2019-03-08 DIAGNOSIS — H401132 Primary open-angle glaucoma, bilateral, moderate stage: Secondary | ICD-10-CM | POA: Diagnosis not present

## 2019-03-30 ENCOUNTER — Ambulatory Visit: Payer: Medicare Other | Admitting: Internal Medicine

## 2019-04-09 DIAGNOSIS — Z96611 Presence of right artificial shoulder joint: Secondary | ICD-10-CM | POA: Diagnosis not present

## 2019-04-09 DIAGNOSIS — M7581 Other shoulder lesions, right shoulder: Secondary | ICD-10-CM | POA: Diagnosis not present

## 2019-05-02 ENCOUNTER — Ambulatory Visit (INDEPENDENT_AMBULATORY_CARE_PROVIDER_SITE_OTHER): Payer: Medicare Other | Admitting: Internal Medicine

## 2019-05-02 DIAGNOSIS — M25512 Pain in left shoulder: Secondary | ICD-10-CM

## 2019-05-02 DIAGNOSIS — G8929 Other chronic pain: Secondary | ICD-10-CM | POA: Diagnosis not present

## 2019-05-02 DIAGNOSIS — E876 Hypokalemia: Secondary | ICD-10-CM | POA: Diagnosis not present

## 2019-05-02 DIAGNOSIS — M1812 Unilateral primary osteoarthritis of first carpometacarpal joint, left hand: Secondary | ICD-10-CM

## 2019-05-02 DIAGNOSIS — I480 Paroxysmal atrial fibrillation: Secondary | ICD-10-CM

## 2019-05-02 DIAGNOSIS — I10 Essential (primary) hypertension: Secondary | ICD-10-CM

## 2019-05-02 DIAGNOSIS — Z1329 Encounter for screening for other suspected endocrine disorder: Secondary | ICD-10-CM | POA: Diagnosis not present

## 2019-05-02 DIAGNOSIS — R7303 Prediabetes: Secondary | ICD-10-CM | POA: Diagnosis not present

## 2019-05-02 NOTE — Progress Notes (Addendum)
Virtual Visit via Video Note  I connected with Kessa Fairbairn  on 05/02/19 at  2:50 PM EDT by a video enabled telemedicine application and verified that I am speaking with the correct person using two identifiers.  Location patient: home Location provider:work Persons participating in the virtual visit: patient, provider  I discussed the limitations of evaluation and management by telemedicine and the availability of in person appointments. The patient expressed understanding and agreed to proceed.   HPI: 1. HTN Pt has not checked her BP today tiazac 120 mg qd 24 hr, hydralazine 25 tid, losartan 100 mg qd  2. Parox Afib has not checked HR f/u with Dr. Raliegh Ip  3. Chronic shoulder pain right and limited ROM s/p surgery 06/2018. She is doing exercises at home and will see ortho 06/2019 4. C/o left hand CMC joint/thumb pain with range of motion tried ice pack per grandaughter who is PT   ROS: See pertinent positives and negatives per HPI.  Past Medical History:  Diagnosis Date  . Allergic rhinitis   . Arthritis   . CHF (congestive heart failure) ([place]) 04/2016   developed 2 weeks after identifying AF  . Complication of anesthesia   . Dysrhythmia 04/2016   a fib    . GERD (gastroesophageal reflux disease)    occasionally has reflux/heartburn  . History of pelvic mass    s/p resection, benign  . Hypercholesteremia   . Hypertension   . Mitral regurgitation    noted echo 04/2016   . Osteoporosis    per pt report on Medical Screening Form   . PONV (postoperative nausea and vomiting)   . Pyelonephritis    remote hx   . Shortness of breath dyspnea   . Varicella zoster 2007    Past Surgical History:  Procedure Laterality Date  . [place]   lower back  . bladder prolapse repair    . BREAST CYST EXCISION Left    1990s  . CATARACT EXTRACTION Bilateral 2014   Dr. Sandra Cockayne  . ELECTROPHYSIOLOGIC STUDY N/A 06/15/2016   Procedure: CARDIOVERSION;  Surgeon: [name], MD;   Location: [facility] ORS;  Service: Cardiovascular;  Laterality: N/A;  . EYE SURGERY     bilateral cataract  . FOOT SURGERY    . HERNIA REPAIR  2013   inguinal  . JOINT REPLACEMENT  [id number]   bilateral knee replacements  . KNEE ARTHROSCOPY     right  . RECTOCELE REPAIR    . REVERSE SHOULDER ARTHROPLASTY Right 06/27/2018   Procedure: REVERSE SHOULDER ARTHROPLASTY;  Surgeon: Corky Mull, MD;  Location: [facility] ORS;  Service: Orthopedics;  Laterality: Right;  . TOTAL KNEE ARTHROPLASTY     left  . VAGINAL HYSTERECTOMY  1970   benign reasons    Family History  Problem Relation Age of Onset  . Emphysema Mother   . Heart disease Father   . Early death Father        died 59 y.o MI   . Breast cancer Maternal Aunt 25    SOCIAL HX: lives alone family in [place]   Current Outpatient Medications:  .  acetaminophen (TYLENOL) 500 MG tablet, Take 500-1,000 mg by mouth 2 (two) times daily as needed for moderate pain or headache., Disp: , Rfl:  .  Cholecalciferol (VITAMIN D3) 2000 units capsule, Take 1,000 Units by mouth daily. , Disp: , Rfl:  .  diclofenac sodium (VOLTAREN) 1 % GEL, Apply 1 application topically as needed (foot pain). ,  Disp: , Rfl:  .  diltiazem (TIAZAC) 120 MG 24 hr capsule, Take 120 mg by mouth once daily at night, Disp: , Rfl:  .  docusate calcium (SURFAK) 240 MG capsule, Take 240 mg by mouth at bedtime. , Disp: , Rfl:  .  flecainide (TAMBOCOR) 50 MG tablet, Take 1 tablet (50 mg total) by mouth 2 (two) times daily., Disp: 60 tablet, Rfl: 4 .  furosemide (LASIX) 40 MG tablet, Take 40 mg by mouth daily. , Disp: , Rfl:  .  hydrALAZINE (APRESOLINE) 25 MG tablet, Take 1 tablet (25 mg total) by mouth 3 (three) times daily. (Patient taking differently: Take 25 mg by mouth 2 (two) times daily. ), Disp: 180 tablet, Rfl: 1 .  losartan (COZAAR) 100 MG tablet, Take 1 tablet (100 mg total) by mouth daily., Disp: 14 tablet, Rfl: 0 .  mupirocin ointment (BACTROBAN) 2 %, Apply 1 application  topically 2 (two) times daily. X 1 week, Disp: 30 g, Rfl: 0 .  potassium chloride (K-DUR,KLOR-CON) 20 MEQ tablet, Take 1 tablet (20 mEq total) by mouth 2 (two) times daily., Disp: 180 tablet, Rfl: 0 .  pravastatin (PRAVACHOL) 20 MG tablet, Take 1 tablet (20 mg total) by mouth every evening., Disp: 90 tablet, Rfl: 3 .  rivaroxaban (XARELTO) 20 MG TABS tablet, Take 1 tablet (20 mg total) by mouth daily with supper., Disp: 10 tablet, Rfl: 0  EXAM:  VITALS per patient if applicable:  GENERAL: alert, oriented, appears well and in no acute distress  HEENT: atraumatic, conjunttiva clear, no obvious abnormalities on inspection of external nose and ears  NECK: normal movements of the head and neck  LUNGS: on inspection no signs of respiratory distress, breathing rate appears normal, no obvious gross SOB, gasping or wheezing  CV: no obvious cyanosis  MS: moves all visible extremities without noticeable abnormality limited ROM right shoulder   PSYCH/NEURO: pleasant and cooperative, no obvious depression or anxiety, speech and thought processing grossly intact  ASSESSMENT AND PLAN:  Discussed the following assessment and plan:  Essential hypertension, parox. Afib -sch fasting labs  -cont meds  Cards appt 08/16/19 Dr. Raliegh Ip  Prediabetes - Plan: Hemoglobin A1c  Hypokalemia - Plan: Magnesium  Paroxysmal atrial fibrillation ([place]) - Plan: TSH-cont Xarelto and Flecainide and f/u cards as above   Chronic right shoulder pain s/p surgery 06/2018 and likely left thumb CMC arthritis  -prn Tylenol, voltaren gel f/u otho 06/2019   HM UTD vaccines(I.e prevnar, Tdap had 06/27/12) -flu shot givenhad 08/29/2018 rec shingrix vaccine pna 23 at f/u  Colonoscopy had 12/24/13  -consider cologuard if candidate in future  mammo had 7/24/18neg DEXA  -will need to order both after COVID 19  Out of pap windows/p hysterectomy then ovaries removed in 2002 DEXA6/2/16osteopenia on vit D nl vit D will rec  calcium 600 mg bid in future -consider repeat DEXA in futurept wants to hold see above    dermatology tbse Dr. Raliegh Ip sch 05/03/2019 at 10:30 am  -saw 05/10/2019 DN, ISK right cheek x 2, AK right cheek x 1 f/u in 6 months   I discussed the assessment and treatment plan with the patient. The patient was provided an opportunity to ask questions and all were answered. The patient agreed with the plan and demonstrated an understanding of the instructions.   The patient was advised to call back or seek an in-person evaluation if the symptoms worsen or if the condition fails to improve as anticipated.  Time spent 25 minutes  Nino Glow McLean-Scocuzza, MD

## 2019-05-03 ENCOUNTER — Telehealth: Payer: Self-pay | Admitting: Internal Medicine

## 2019-05-03 ENCOUNTER — Encounter: Payer: Self-pay | Admitting: Internal Medicine

## 2019-05-03 DIAGNOSIS — D485 Neoplasm of uncertain behavior of skin: Secondary | ICD-10-CM | POA: Diagnosis not present

## 2019-05-03 DIAGNOSIS — L820 Inflamed seborrheic keratosis: Secondary | ICD-10-CM | POA: Diagnosis not present

## 2019-05-03 DIAGNOSIS — L570 Actinic keratosis: Secondary | ICD-10-CM | POA: Diagnosis not present

## 2019-05-03 NOTE — Telephone Encounter (Signed)
Subject: Visit Follow-Up Question               Dr. Bobbie Stack at 4:00pm my blood pressure was 158/79/81.Marland Kitchen upon waking this am I did some mild arm exercises in bed as I usually do. Before taking my morning pills I took blood pressure and it registered 189/94/54  . Before going to follow up visit with Dr. Nehemiah Massed I took it again--207/93/61. I had taken morning pills by then---no coffee or food.  Now at 1:04, with no coffee or food pressure has registered 211/95/67.  Tomorrow I have appt. for blood work at 10:00am. What would you like me to do ? Thank you, Tina Howard    CALL PT appt for labs 05/04/2019 I want her to have a nurse visit for blood pressure check as well and bring her meter with mask on   Thanks [place]

## 2019-05-04 ENCOUNTER — Other Ambulatory Visit: Payer: Self-pay

## 2019-05-04 ENCOUNTER — Other Ambulatory Visit (INDEPENDENT_AMBULATORY_CARE_PROVIDER_SITE_OTHER): Payer: Medicare Other

## 2019-05-04 ENCOUNTER — Ambulatory Visit (INDEPENDENT_AMBULATORY_CARE_PROVIDER_SITE_OTHER): Payer: Medicare Other

## 2019-05-04 DIAGNOSIS — I10 Essential (primary) hypertension: Secondary | ICD-10-CM | POA: Diagnosis not present

## 2019-05-04 DIAGNOSIS — E876 Hypokalemia: Secondary | ICD-10-CM | POA: Diagnosis not present

## 2019-05-04 DIAGNOSIS — I480 Paroxysmal atrial fibrillation: Secondary | ICD-10-CM

## 2019-05-04 DIAGNOSIS — R7303 Prediabetes: Secondary | ICD-10-CM

## 2019-05-04 LAB — COMPREHENSIVE METABOLIC PANEL
ALT: 12 U/L (ref 0–35)
AST: 13 U/L (ref 0–37)
Albumin: 4.1 g/dL (ref 3.5–5.2)
Alkaline Phosphatase: 58 U/L (ref 39–117)
BUN: 16 mg/dL (ref 6–23)
CO2: 30 mEq/L (ref 19–32)
Calcium: 8.9 mg/dL (ref 8.4–10.5)
Chloride: 103 mEq/L (ref 96–112)
Creatinine, Ser: 0.52 mg/dL (ref 0.40–1.20)
GFR: 112.36 mL/min (ref >60.00)
Glucose, Bld: 121 mg/dL — ABNORMAL HIGH (ref 70–99)
Potassium: 3.5 mEq/L (ref 3.5–5.1)
Sodium: 142 mEq/L (ref 135–145)
Total Bilirubin: 0.5 mg/dL (ref 0.2–1.2)
Total Protein: 6.7 g/dL (ref 6.0–8.3)

## 2019-05-04 LAB — CBC WITH DIFFERENTIAL/PLATELET
Basophils Absolute: 0 10*3/uL (ref 0.0–0.1)
Basophils Relative: 0.6 % (ref 0.0–3.0)
Eosinophils Absolute: 0.1 10*3/uL (ref 0.0–0.7)
Eosinophils Relative: 2.2 % (ref 0.0–5.0)
HCT: 37.4 % (ref 36.0–46.0)
Hemoglobin: 12.5 g/dL (ref 12.0–15.0)
Lymphocytes Relative: 26.7 % (ref 12.0–46.0)
Lymphs Abs: 1.5 10*3/uL (ref 0.7–4.0)
MCHC: 33.4 g/dL (ref 30.0–36.0)
MCV: 83.7 fl (ref 78.0–100.0)
Monocytes Absolute: 0.5 10*3/uL (ref 0.1–1.0)
Monocytes Relative: 8 % (ref 3.0–12.0)
Neutro Abs: 3.6 10*3/uL (ref 1.4–7.7)
Neutrophils Relative %: 62.5 % (ref 43.0–77.0)
Platelets: 253 10*3/uL (ref 150.0–400.0)
RBC: 4.47 Mil/uL (ref 3.87–5.11)
RDW: 14.6 % (ref 11.5–15.5)
WBC: 5.7 10*3/uL (ref 4.0–10.5)

## 2019-05-04 LAB — LIPID PANEL
Cholesterol: 194 mg/dL (ref 0–200)
HDL: 64 mg/dL (ref >39.00)
LDL Cholesterol: 104 mg/dL — ABNORMAL HIGH (ref 0–99)
NonHDL: 130.08
Total CHOL/HDL Ratio: 3
Triglycerides: 132 mg/dL (ref 0.0–149.0)
VLDL: 26.4 mg/dL (ref 0.0–40.0)

## 2019-05-04 LAB — TSH: TSH: 1.88 u[IU]/mL (ref 0.35–4.50)

## 2019-05-04 LAB — MAGNESIUM: Magnesium: 2.1 mg/dL (ref 1.5–2.5)

## 2019-05-04 LAB — HEMOGLOBIN A1C: Hgb A1c MFr Bld: 5.8 % (ref 4.6–6.5)

## 2019-05-04 NOTE — Progress Notes (Signed)
Patient is here for a BP check due to bp being high 190's/80's, as per patient.  Currently patients BP is 146/68 and BPM is 63.  Patient has no complaints of headaches, blurry vision, chest pain, arm pain, light headedness, dizziness, and nor jaw pain  Patient will be coming in for Follow up BP check next Tuesday.

## 2019-05-04 NOTE — Telephone Encounter (Signed)
Please do this when she comes in for labs today   [place]

## 2019-05-04 NOTE — Progress Notes (Signed)
BP 190/82 then 146/68 home BP meter older and off by 10 pts sbp and 15 pts dbp and pt was stressed today due to umbrella blowing off porch and phone note working and not being able to be around people/family  She was instructed to go home take hydralazine 25 tid, dilt 120 qd and losartan 100 mg qd   [place]

## 2019-05-04 NOTE — Telephone Encounter (Signed)
There is no nurse visits on fridays.. I can have her schedule one for next week

## 2019-05-08 ENCOUNTER — Other Ambulatory Visit: Payer: Self-pay

## 2019-05-08 ENCOUNTER — Ambulatory Visit (INDEPENDENT_AMBULATORY_CARE_PROVIDER_SITE_OTHER): Payer: Medicare Other

## 2019-05-08 ENCOUNTER — Ambulatory Visit: Payer: Medicare Other | Admitting: Podiatry

## 2019-05-08 VITALS — BP 140/80 | HR 67

## 2019-05-08 DIAGNOSIS — I10 Essential (primary) hypertension: Secondary | ICD-10-CM

## 2019-05-08 NOTE — Progress Notes (Signed)
Patient here for nurse visit BP check per order fromTracy Mclean-Scocuzza.  Patient reports compliance with prescribed BP medications: yes  Last dose of BP medication: 9:15 am today.  BP Readings from Last 3 Encounters:  05/08/19 140/80  12/08/18 (!) 177/80  11/28/18 122/74   Pulse Readings from Last 3 Encounters:  05/08/19 67  12/08/18 71  11/28/18 75   On patients BP machine BP was : 152/74 and Pulse: 62  Per Dr.Mclean was okay with nurse BP results.    Patient verbalized understanding of instructions.   Gordy Councilman, CMA

## 2019-05-11 ENCOUNTER — Encounter: Payer: Self-pay | Admitting: Internal Medicine

## 2019-05-14 ENCOUNTER — Telehealth: Payer: Self-pay | Admitting: Internal Medicine

## 2019-05-14 ENCOUNTER — Telehealth: Payer: Self-pay

## 2019-05-14 NOTE — Telephone Encounter (Signed)
Can you try to call this patient to see how we can assist her a home with managing her medications?  I.e pill box, pill pack, etc?  Time

## 2019-05-14 NOTE — Telephone Encounter (Signed)
See my chart and call and discuss with patient   I would use caution today as taking too much Hydralazine may make you feel dizzy or lightheaded or like you want to pass out   If you start to feel bad call 911 or if you are able check your blood pressure yourself  I would alert a nurse at your community if you have one so they also know what happened with your medications today

## 2019-05-14 NOTE — Telephone Encounter (Signed)
Called patient no answer on cell or house phone left message to call office, patient sent My chart message on 05/11/19 when out of office stating she had taken 100 mg of hydralazine at once along with 25 mg she had taken that morning was trying to all and make sure patient having any symptoms. Also to advise patient no one to scan my chart messages on weekend should not leave this type of message needs to call nurses line.

## 2019-05-15 ENCOUNTER — Encounter: Payer: Self-pay | Admitting: Podiatry

## 2019-05-15 ENCOUNTER — Other Ambulatory Visit: Payer: Self-pay

## 2019-05-15 ENCOUNTER — Ambulatory Visit (INDEPENDENT_AMBULATORY_CARE_PROVIDER_SITE_OTHER): Payer: Medicare Other | Admitting: Podiatry

## 2019-05-15 DIAGNOSIS — L989 Disorder of the skin and subcutaneous tissue, unspecified: Secondary | ICD-10-CM | POA: Diagnosis not present

## 2019-05-15 DIAGNOSIS — B351 Tinea unguium: Secondary | ICD-10-CM | POA: Diagnosis not present

## 2019-05-15 DIAGNOSIS — M79676 Pain in unspecified toe(s): Secondary | ICD-10-CM | POA: Diagnosis not present

## 2019-05-15 MED ORDER — DICLOFENAC SODIUM 1 % TD GEL: 1.0000 "application " | TRANSDERMAL | 3 refills | Status: AC | PRN

## 2019-05-17 NOTE — Progress Notes (Signed)
    Subjective: Patient is a 85 y.o. female presenting to the office today for follow up evaluation of painful callus lesions noted to the bilateral feet. Bearing weight increases the pain. She has not had any recent treatment.   Patient also complains of elongated, thickened nails that cause pain while ambulating in shoes. She is unable to trim her own nails. Patient presents today for further treatment and evaluation.  Past Medical History:  Diagnosis Date  . Allergic rhinitis   . Arthritis   . CHF (congestive heart failure) ([place]) 04/2016   developed 2 weeks after identifying AF  . Complication of anesthesia   . Dysrhythmia 04/2016   a fib    . GERD (gastroesophageal reflux disease)    occasionally has reflux/heartburn  . History of pelvic mass    s/p resection, benign  . Hypercholesteremia   . Hypertension   . Mitral regurgitation    noted echo 04/2016   . Osteoporosis    per pt report on Medical Screening Form   . PONV (postoperative nausea and vomiting)   . Pyelonephritis    remote hx   . Shortness of breath dyspnea   . Varicella zoster 2007    Objective:  Physical Exam General: Alert and oriented x3 in no acute distress  Dermatology: Hyperkeratotic lesions present on the bilateral feet. Pain on palpation with a central nucleated core noted. Skin is warm, dry and supple bilateral lower extremities. Negative for open lesions or macerations. Nails are tender, long, thickened and dystrophic with subungual debris, consistent with onychomycosis, 1-5 bilateral. No signs of infection noted.  Vascular: Palpable pedal pulses bilaterally. No edema or erythema noted. Capillary refill within normal limits.  Neurological: Epicritic and protective threshold grossly intact bilaterally.   Musculoskeletal Exam: Pain on palpation at the keratotic lesions noted. Range of motion within normal limits bilateral. Muscle strength 5/5 in all groups bilateral.  Assessment: 1. Onychodystrophic  nails 1-5 bilateral with hyperkeratosis of nails.  2. Onychomycosis of nail due to dermatophyte bilateral 3. Pre-ulcerative callus lesions noted to the bilateral feet x 3   Plan of Care:  1. Patient evaluated. 2. Excisional debridement of keratoic lesions using a chisel blade was performed without incident.  3. Dressed with light dressing. 4. Mechanical debridement of nails 1-5 bilaterally performed using a nail nipper. Filed with dremel without incident.  5. Patient is to return to the clinic in 3 months.   Edrick Kins, DPM [facility]  Dr. Edrick Kins, [place]                                        [place],  [address]                Office [phone]  Fax [phone]

## 2019-05-18 ENCOUNTER — Other Ambulatory Visit: Payer: Self-pay | Admitting: Pharmacist

## 2019-05-18 NOTE — Patient Outreach (Signed)
[facility]) Care Management  05/18/2019  KLOI BRODMAN April 02, 1933 [id number]  Referral Reason: Medication Adherence/Packaging Referral Source: PCP Dr. Terese Door  Received message from Dr. Terese Door asking for my assistance in helping set this patient up for adherence packaging services.   Contacted patient for a medication review; left HIPAA compliant message for her to return my call at her convenience.   Plan:  - If I do not hear back, will follow up within 7-10 days.   Catie Darnelle Maffucci, PharmD, [place] PGY2 Ambulatory Care Pharmacy Resident, [facility] Phone: [phone]

## 2019-05-28 ENCOUNTER — Other Ambulatory Visit: Payer: Self-pay | Admitting: Pharmacist

## 2019-05-28 NOTE — Patient Outreach (Signed)
[facility]) Care Management  [place]   05/28/2019  Tina Howard [birth year] [id number]  Reason for referral: Medication Adherence  Referral source: Dr. Terese Door Current insurance: Medicare A/B + AARP  PMHx includes but not limited to:  Afib, HTN, Osteoarthritis, HLD,   Outreach:  Successful telephone call with Tina Howard.  HIPAA identifiers verified.   Received message from Dr. Terese Door asking if I could outreach this patient to discuss pill packaging. On 05/11/2019, she contacted the office noting that she accidentally took 4 hydralazine 25 mg tablets at once. She had significant dizziness thereafter, but has not had any episodes such as this again.  Today, she notes that she is not interested in switching from receiving all medications from [facility] to receive in pill packaging. We discussed weekly pill packages available at local independent pharmacies, as well as Pill Packs available through [facility]. She notes that she is fine managing her medications at this time.   Objective: Lab Results  Component Value Date   CREATININE 0.52 05/04/2019   CREATININE 0.54 08/29/2018   CREATININE 0.45 07/11/2018    Lab Results  Component Value Date   HGBA1C 5.8 05/04/2019    Lipid Panel     Component Value Date/Time   CHOL 194 05/04/2019 0945   TRIG 132.0 05/04/2019 0945   HDL 64.00 05/04/2019 0945   CHOLHDL 3 05/04/2019 0945   VLDL 26.4 05/04/2019 0945   LDLCALC 104 (H) 05/04/2019 0945   LDLDIRECT 101.5 06/27/2012 1128    BP Readings from Last 3 Encounters:  05/08/19 140/80  12/08/18 (!) 177/80  11/28/18 122/74    Allergies  Allergen Reactions  . Amlodipine Swelling    Ankle swelling  . Azo Urinary Tract Support Other (See Comments)    Skin turned orange  . Erythromycin Nausea And Vomiting    Medications Reviewed Today    Reviewed by De Hollingshead, [facility] (Pharmacist) on 05/28/19 at 1142  Med List Status: <None>  Medication  Order Taking? Sig Documenting Provider Last Dose Status Informant  acetaminophen (TYLENOL) 500 MG tablet [id number]  Take 500-1,000 mg by mouth 2 (two) times daily as needed for moderate pain or headache. [provider]  Active Self  Cholecalciferol (VITAMIN D3) 2000 units capsule [id number] Yes Take 1,000 Units by mouth daily.  [provider] Taking Active Self  diclofenac sodium (VOLTAREN) 1 % GEL [id number]  Apply 1 application topically as needed (foot pain). Edrick Kins, DPM  Active   diltiazem [facility]) 120 MG 24 hr capsule [id number] Yes Take 120 mg by mouth once daily at night [provider] Taking Active Self  docusate calcium (SURFAK) 240 MG capsule [id number] Yes Take 240 mg by mouth at bedtime.  [provider] Taking Active Self  flecainide (TAMBOCOR) 50 MG tablet [id number] Yes Take 1 tablet (50 mg total) by mouth 2 (two) times daily. [name], MD Taking Active Self  furosemide (LASIX) 40 MG tablet [id number] Yes Take 40 mg by mouth daily.  [provider] Taking Active Self  hydrALAZINE (APRESOLINE) 25 MG tablet [id number] Yes Take 1 tablet (25 mg total) by mouth 3 (three) times daily.  Patient taking differently:  Take 25 mg by mouth 2 (two) times daily.    McLean-Scocuzza, Nino Glow, MD Taking Active Self  losartan (COZAAR) 100 MG tablet [id number] Yes Take 1 tablet (100 mg total) by mouth daily. McLean-Scocuzza, Nino Glow, MD Taking Active   mupirocin ointment (BACTROBAN) 2 % [id number]  Apply  1 application topically 2 (two) times daily. X 1 week McLean-Scocuzza, Nino Glow, MD  Active   potassium chloride (K-DUR,KLOR-CON) 20 MEQ tablet [id number] Yes Take 1 tablet (20 mEq total) by mouth 2 (two) times daily. McLean-Scocuzza, Nino Glow, MD Taking Active   pravastatin (PRAVACHOL) 20 MG tablet [id number] Yes Take 1 tablet (20 mg total) by mouth every evening. McLean-Scocuzza, Nino Glow, MD Taking Active   rivaroxaban (XARELTO) 20 MG TABS tablet  [id number] Yes Take 1 tablet (20 mg total) by mouth daily with supper. Burnard Hawthorne, FNP Taking Active Self          Assessment: Medication Review Findings:  . Reviewed al medications. Patient was easily able to list off current medications and what time of the day she takes them. She feels confident in her ability to manage her medications, and notes that the hydralazine error was just a "slip up". We discussed available adherence packaging options, as well as use of pill boxes, and she notes that she will consider a pill box, but declines any pre-packaged adherence packaging at this time . Counseled patient on the importance of adherence, she verbalized understanding o Accurately reports that she takes Xarelto with a meal, and takes pravastatin in the evenings.   Plan: . Will let Dr. Terese Door know that the patient feels confident in self-medication management, but that she is aware of other options available in the future.  . Patient has my contact information for any future questions or concerns.   Catie Darnelle Maffucci, PharmD, [place] PGY2 Ambulatory Care Pharmacy Resident, [facility] Phone: [phone]

## 2019-06-05 ENCOUNTER — Other Ambulatory Visit: Payer: Self-pay

## 2019-06-05 ENCOUNTER — Ambulatory Visit
Admission: RE | Admit: 2019-06-05 | Discharge: 2019-06-05 | Disposition: A | Discharge status: Home / Self Care | Payer: Medicare Other | Source: Ambulatory Visit | Attending: Internal Medicine | Admitting: Internal Medicine

## 2019-06-05 ENCOUNTER — Other Ambulatory Visit: Payer: Self-pay | Admitting: Internal Medicine

## 2019-06-05 ENCOUNTER — Encounter: Payer: Self-pay | Admitting: Internal Medicine

## 2019-06-05 ENCOUNTER — Ambulatory Visit (INDEPENDENT_AMBULATORY_CARE_PROVIDER_SITE_OTHER): Payer: Medicare Other | Admitting: Internal Medicine

## 2019-06-05 DIAGNOSIS — W19XXXD Unspecified fall, subsequent encounter: Secondary | ICD-10-CM

## 2019-06-05 DIAGNOSIS — W19XXXA Unspecified fall, initial encounter: Secondary | ICD-10-CM

## 2019-06-05 DIAGNOSIS — M25562 Pain in left knee: Secondary | ICD-10-CM

## 2019-06-05 DIAGNOSIS — M7918 Myalgia, other site: Secondary | ICD-10-CM

## 2019-06-05 DIAGNOSIS — E2839 Other primary ovarian failure: Secondary | ICD-10-CM | POA: Diagnosis not present

## 2019-06-05 DIAGNOSIS — R519 Headache, unspecified: Secondary | ICD-10-CM

## 2019-06-05 DIAGNOSIS — M8580 Other specified disorders of bone density and structure, unspecified site: Secondary | ICD-10-CM

## 2019-06-05 DIAGNOSIS — R51 Headache: Secondary | ICD-10-CM | POA: Diagnosis not present

## 2019-06-05 DIAGNOSIS — Z96652 Presence of left artificial knee joint: Secondary | ICD-10-CM | POA: Insufficient documentation

## 2019-06-05 DIAGNOSIS — S7001XA Contusion of right hip, initial encounter: Secondary | ICD-10-CM | POA: Diagnosis not present

## 2019-06-05 DIAGNOSIS — S8002XA Contusion of left knee, initial encounter: Secondary | ICD-10-CM | POA: Diagnosis not present

## 2019-06-05 DIAGNOSIS — S79912A Unspecified injury of left hip, initial encounter: Secondary | ICD-10-CM | POA: Diagnosis not present

## 2019-06-05 NOTE — Progress Notes (Addendum)
Virtual Visit via Video Note  I connected with Tina Howard   on 06/05/19 at  2:19 PM EDT by a video enabled telemedicine application and verified that I am speaking with the correct person using two identifiers.  Location patient: home Location provider:work Persons participating in the virtual visit: patient, provider, Pts friend   I discussed the limitations of evaluation and management by telemedicine and the availability of in person appointments. The patient expressed understanding and agreed to proceed.   HPI: 06/03/2019 pt was watering flowers on her patio and tripped over the water hose she hit her left buttock and ? If she hit her head  She used a chair to catch her fall and the chair flipped over on her when she landed on her left buttock.  She c/o left buttock bruise and pain since the fall.  There is no swelling on the buttocks or fluid collection per her friend [name] on examination today but there is about 6-8 inches of bruising on the left buttock.  It hurts to get up sit, stand, lying down and less painful to walk.  She also c/o crown of head pain and left sided h/a intermittently since fall on Xarelto.  She had to roll herself to get up and a neighbor saw she feel and called EMS which checked her out but she did not go to the Ed. 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 Neuro: denies dizziness or vision problems   Past Medical History:  Diagnosis Date  . Allergic rhinitis   . Arthritis   . CHF (congestive heart failure) ([place]) 04/2016   developed 2 weeks after identifying AF  . Complication of anesthesia   . Dysrhythmia 04/2016   a fib    . GERD (gastroesophageal reflux disease)    occasionally has reflux/heartburn  . History of pelvic mass    s/p resection, benign  . Hypercholesteremia   . Hypertension   . Mitral regurgitation    noted echo  04/2016   . Osteoporosis    per pt report on Medical Screening Form   . PONV (postoperative nausea and vomiting)   . Pyelonephritis    remote hx   . Shortness of breath dyspnea   . Varicella zoster 2007    Past Surgical History:  Procedure Laterality Date  . [place]   lower back  . bladder prolapse repair    . BREAST CYST EXCISION Left    1990s  . CATARACT EXTRACTION Bilateral 2014   Dr. Sandra Cockayne  . ELECTROPHYSIOLOGIC STUDY N/A 06/15/2016   Procedure: CARDIOVERSION;  Surgeon: [name], MD;  Location: [facility] ORS;  Service: Cardiovascular;  Laterality: N/A;  . EYE SURGERY     bilateral cataract  . FOOT SURGERY    . HERNIA REPAIR  2013   inguinal  . JOINT REPLACEMENT  [phone]   bilateral knee replacements  . KNEE ARTHROSCOPY     right  . RECTOCELE REPAIR    . REVERSE SHOULDER ARTHROPLASTY Right 06/27/2018   Procedure: REVERSE SHOULDER ARTHROPLASTY;  Surgeon: Corky Mull, MD;  Location: [facility] ORS;  Service: Orthopedics;  Laterality: Right;  . TOTAL KNEE ARTHROPLASTY     left  . VAGINAL HYSTERECTOMY  1970   benign reasons    Family History  Problem Relation Age of Onset  . Emphysema Mother   .  Heart disease Father   . Early death Father        died 55 y.o MI   . Breast cancer Maternal Aunt 39    SOCIAL HX: lives alone though son comes to see her 2x per week    Current Outpatient Medications:  .  acetaminophen (TYLENOL) 500 MG tablet, Take 500-1,000 mg by mouth 2 (two) times daily as needed for moderate pain or headache., Disp: , Rfl:  .  Cholecalciferol (VITAMIN D3) 2000 units capsule, Take 1,000 Units by mouth daily. , Disp: , Rfl:  .  diclofenac sodium (VOLTAREN) 1 % GEL, Apply 1 application topically as needed (foot pain)., Disp: 100 g, Rfl: 3 .  diltiazem (TIAZAC) 120 MG 24 hr capsule, Take 120 mg by mouth once daily at night, Disp: , Rfl:  .  docusate calcium (SURFAK) 240 MG capsule, Take 240 mg by mouth at bedtime. , Disp: , Rfl:  .   flecainide (TAMBOCOR) 50 MG tablet, Take 1 tablet (50 mg total) by mouth 2 (two) times daily., Disp: 60 tablet, Rfl: 4 .  furosemide (LASIX) 40 MG tablet, Take 40 mg by mouth daily. , Disp: , Rfl:  .  hydrALAZINE (APRESOLINE) 25 MG tablet, Take 1 tablet (25 mg total) by mouth 3 (three) times daily. (Patient taking differently: Take 25 mg by mouth 2 (two) times daily. ), Disp: 180 tablet, Rfl: 1 .  losartan (COZAAR) 100 MG tablet, Take 1 tablet (100 mg total) by mouth daily., Disp: 14 tablet, Rfl: 0 .  mupirocin ointment (BACTROBAN) 2 %, Apply 1 application topically 2 (two) times daily. X 1 week, Disp: 30 g, Rfl: 0 .  potassium chloride (K-DUR,KLOR-CON) 20 MEQ tablet, Take 1 tablet (20 mEq total) by mouth 2 (two) times daily., Disp: 180 tablet, Rfl: 0 .  pravastatin (PRAVACHOL) 20 MG tablet, Take 1 tablet (20 mg total) by mouth every evening., Disp: 90 tablet, Rfl: 3 .  rivaroxaban (XARELTO) 20 MG TABS tablet, Take 1 tablet (20 mg total) by mouth daily with supper., Disp: 10 tablet, Rfl: 0  EXAM:  VITALS per patient if applicable:  GENERAL: alert, oriented, appears well and in no acute distress  HEENT: atraumatic, conjunttiva clear, no obvious abnormalities on inspection of external nose and ears  NECK: normal movements of the head and neck  LUNGS: on inspection no signs of respiratory distress, breathing rate appears normal, no obvious gross SOB, gasping or wheezing  CV: no obvious cyanosis  MS: moves all visible extremities without noticeable abnormality  PSYCH/NEURO: pleasant and cooperative, no obvious depression or anxiety, speech and thought processing grossly intact  Skin: left buttock medium sized bruise no obvi hematoma   ASSESSMENT AND PLAN:  Discussed the following assessment and plan:  Fall, on Xarelto- Plan: CT Head Wo Contrast, DG Knee Complete 4 Views Left s/p TKR left, b/l hip and pelvis Xray   Nonintractable headache, unspecified chronicity pattern, unspecified  headache type - Plan: CT Head Wo Contrast negative   Left buttock pain - Plan: b/l hips and pelvis Xray  -prn Tylenol   Acute pain of left knee - Plan: DG Knee Complete 4 Views Left negative  H/O total knee replacement, left - Plan: DG Knee Complete 4 Views Left negative   HM UTD vaccines(I.e prevnar, Tdap had 06/27/12) -flu shot givenhad9/02/2018 rec shingrix vaccine pna 23 at f/u  Colonoscopy had 12/24/13 -consider cologuard if candidate in future  mammo had 7/24/18negordered for future  DEXA  -will need to order both  after COVID 19  Out of pap windows/p hysterectomy then ovaries removed in 2002 DEXA6/2/16osteopenia on vit D nl vit D will rec calcium 600 mg bid in future -consider repeat DEXA in futurept wants to hold see above   dermatology tbse Dr. Ruthell Rummage 05/03/2019 at 10:30 am  -saw 05/10/2019 DN, ISK right cheek x 2, AK right cheek x 1 f/u in 6 months     I discussed the assessment and treatment plan with the patient. The patient was provided an opportunity to ask questions and all were answered. The patient agreed with the plan and demonstrated an understanding of the instructions.   The patient was advised to call back or seek an in-person evaluation if the symptoms worsen or if the condition fails to improve as anticipated.  Time spent 25 minutes  Delorise Jackson, MD

## 2019-06-05 NOTE — Patient Instructions (Signed)
Preventing Falls and Fractures  Falls can be very serious, especially for older adults or people with osteoporosis  Falls can be caused by:  Tripping or slipping  Slow reflexes  Balance problems  Reduced muscle strength  Poor vision or a recent change in prescription  Illness and some medications (especially blood pressure pills, diuretics, heart medicines, muscle relaxants and sleep medications)  Drinking alcohol  To prevent falls outdoors:  Use a can or walker if needed  Wear rubber-soled shoes so you don't slip  DO NOT buy "shape up" shoes with rocker bottom soles if you have balance problems.  The thick soles and shape make it more difficult to keep your balance.  Put kitty litter or salt on icy sidewalks  Walk on the grass if the sidewalks are slick  Avoid walking on uneven ground whenever possible  T prevent falls indoors:  Keep rooms clutter-free, especially hallways, stairs and paths to light switches  Remove throw rugs  Install night lights, especially to and in the bathroom  Turn on lights before going downstairs  Keep a flashlight next to your bed  Buy a cordless phone to keep with you instead of jumping up to answer the phone  Install grab bars in the bathroom near the shower and toilet  Install rails on both sides of the stairs.  Make sure the stairs are well lit  Wear slippers with non-skid soles.  Do not walk around in stockings or socks  Balance problems and dizziness are not a normal part of growing older.  If you begin having balance problems or dizziness see your doctor.  Physical Therapy can help you with many balance problems, strengthening hip and leg muscles and with gait training.  To keep your bones healthy make sure you are getting enough calcium and Vitamin D each day.  Ask your doctor or pharmacist about supplements.  Regular weight-bearing exercise like walking, lifting weights or dancing can help strengthen bones and prevent  osteoporosis.  It is important to avoid accidents which may result in broken bones.  Here are a few ideas on how to make your home safer so you will be less likely to trip or fall.  8. Use nonskid mats or non slip strips in your shower or tub, on your bathroom floor and around sinks.  If you know that you have spilled water, wipe it up! 9. In the bathroom, it is important to have properly installed grab bars on the walls or on the edge of the tub.  Towel racks are NOT strong enough for you to hold onto or to pull on for support. 10. Stairs and hallways should have enough light.  Add lamps or night lights if you need ore light. 11. It is good to have handrails on both sides of the stairs if possible.  Always fix broken handrails right away. 12. It is important to see the edges of steps.  Paint the edges of outdoor steps white so you can see them better.  Put colored tape on the edge of inside steps. 13. Throw-rugs are dangerous because they can slide.  Removing the rugs is the best idea, but if they must stay, add adhesive carpet tape to prevent slipping. 14. Do not keep things on stairs or in the halls.  Remove small furniture that blocks the halls as it may cause you to trip.  Keep telephone and electrical cords out of the way where you walk. 15. Always were sturdy, rubber-soled shoes for good  support.  Never wear just socks, especially on the stairs.  Socks may cause you to slip or fall.  Do not wear full-length housecoats as you can easily trip on the bottom.  16. Place the things you use the most on the shelves that are the easiest to reach.  If you use a stepstool, make sure it is in good condition.  If you feel unsteady, DO NOT climb, ask for help. 17. If a health professional advises you to use a cane or walker, do not be ashamed.  These items can keep you from falling and breaking your bones.

## 2019-06-06 ENCOUNTER — Telehealth: Payer: Self-pay

## 2019-06-06 NOTE — Telephone Encounter (Signed)
Copied from [place] [id number]. Topic: General - Other >> Jun 05, 2019  4:54 PM Rainey Pines A wrote: [facility] radiology called and wanted to speak with Dr. Terese Door about her orders on holding the patient there at their facility and would like a callback at [phone].

## 2019-06-08 ENCOUNTER — Telehealth: Payer: Self-pay | Admitting: Internal Medicine

## 2019-06-08 NOTE — Telephone Encounter (Signed)
I am not aware of how to order a life alert for her I think she has to pursue this on her own  Another doctor in the practice told me there used to be a kiosk at [facility] in the medical mall but I am not sure of this   [place]

## 2019-06-08 NOTE — Telephone Encounter (Signed)
Left message for patient to return call back. [facility] may give information.  

## 2019-06-19 ENCOUNTER — Encounter: Payer: Self-pay | Admitting: Internal Medicine

## 2019-06-19 ENCOUNTER — Other Ambulatory Visit: Payer: Self-pay | Admitting: Internal Medicine

## 2019-06-19 MED ORDER — LOSARTAN POTASSIUM 100 MG PO TABS: 100.0000 mg | ORAL_TABLET | Freq: Every day | ORAL | 3 refills | Status: DC

## 2019-06-25 ENCOUNTER — Encounter: Payer: Self-pay | Admitting: Internal Medicine

## 2019-07-09 DIAGNOSIS — M7581 Other shoulder lesions, right shoulder: Secondary | ICD-10-CM | POA: Diagnosis not present

## 2019-07-09 DIAGNOSIS — Z96611 Presence of right artificial shoulder joint: Secondary | ICD-10-CM | POA: Diagnosis not present

## 2019-07-31 ENCOUNTER — Ambulatory Visit
Admission: RE | Admit: 2019-07-31 | Discharge: 2019-07-31 | Disposition: A | Discharge status: Home / Self Care | Payer: Medicare Other | Source: Ambulatory Visit | Attending: Internal Medicine | Admitting: Internal Medicine

## 2019-07-31 DIAGNOSIS — Z780 Asymptomatic menopausal state: Secondary | ICD-10-CM | POA: Diagnosis not present

## 2019-07-31 DIAGNOSIS — M8580 Other specified disorders of bone density and structure, unspecified site: Secondary | ICD-10-CM | POA: Insufficient documentation

## 2019-07-31 DIAGNOSIS — E2839 Other primary ovarian failure: Secondary | ICD-10-CM | POA: Diagnosis not present

## 2019-07-31 DIAGNOSIS — M85832 Other specified disorders of bone density and structure, left forearm: Secondary | ICD-10-CM | POA: Diagnosis not present

## 2019-08-14 ENCOUNTER — Ambulatory Visit (INDEPENDENT_AMBULATORY_CARE_PROVIDER_SITE_OTHER): Payer: Medicare Other | Admitting: Podiatry

## 2019-08-14 ENCOUNTER — Other Ambulatory Visit: Payer: Self-pay

## 2019-08-14 ENCOUNTER — Encounter: Payer: Self-pay | Admitting: Podiatry

## 2019-08-14 VITALS — Temp 97.8°F

## 2019-08-14 DIAGNOSIS — B351 Tinea unguium: Secondary | ICD-10-CM

## 2019-08-14 DIAGNOSIS — M79676 Pain in unspecified toe(s): Secondary | ICD-10-CM | POA: Diagnosis not present

## 2019-08-14 DIAGNOSIS — L989 Disorder of the skin and subcutaneous tissue, unspecified: Secondary | ICD-10-CM

## 2019-08-15 NOTE — Progress Notes (Signed)
    Subjective: Patient is a 85 y.o. female presenting to the office today for follow up evaluation of painful callus lesions noted to the bilateral feet. Bearing weight increases the pain. She has not had any recent treatment.   Patient also complains of elongated, thickened nails that cause pain while ambulating in shoes. She is unable to trim her own nails. Patient presents today for further treatment and evaluation.  Past Medical History:  Diagnosis Date  . Allergic rhinitis   . Arthritis   . CHF (congestive heart failure) ([place]) 04/2016   developed 2 weeks after identifying AF  . Complication of anesthesia   . Dysrhythmia 04/2016   a fib    . GERD (gastroesophageal reflux disease)    occasionally has reflux/heartburn  . History of pelvic mass    s/p resection, benign  . Hypercholesteremia   . Hypertension   . Mitral regurgitation    noted echo 04/2016   . Osteoporosis    per pt report on Medical Screening Form   . PONV (postoperative nausea and vomiting)   . Pyelonephritis    remote hx   . Shortness of breath dyspnea   . Varicella zoster 2007    Objective:  Physical Exam General: Alert and oriented x3 in no acute distress  Dermatology: Hyperkeratotic lesions present on the bilateral feet. Pain on palpation with a central nucleated core noted. Skin is warm, dry and supple bilateral lower extremities. Negative for open lesions or macerations. Nails are tender, long, thickened and dystrophic with subungual debris, consistent with onychomycosis, 1-5 bilateral. No signs of infection noted.  Vascular: Palpable pedal pulses bilaterally. No edema or erythema noted. Capillary refill within normal limits.  Neurological: Epicritic and protective threshold grossly intact bilaterally.   Musculoskeletal Exam: Pain on palpation at the keratotic lesions noted. Range of motion within normal limits bilateral. Muscle strength 5/5 in all groups bilateral.  Assessment: 1. Onychodystrophic  nails 1-5 bilateral with hyperkeratosis of nails.  2. Onychomycosis of nail due to dermatophyte bilateral 3. Pre-ulcerative callus lesions noted to the bilateral feet x 3   Plan of Care:  1. Patient evaluated. 2. Excisional debridement of keratoic lesions using a chisel blade was performed without incident.  3. Dressed with light dressing. 4. Mechanical debridement of nails 1-5 bilaterally performed using a nail nipper. Filed with dremel without incident.  5. Patient is to return to the clinic in 3 months.   Edrick Kins, DPM [facility]  Dr. Edrick Kins, [address]                                        [place], [place] [address]                Office [phone]  Fax [phone]

## 2019-08-16 DIAGNOSIS — I480 Paroxysmal atrial fibrillation: Secondary | ICD-10-CM | POA: Diagnosis not present

## 2019-08-16 DIAGNOSIS — I340 Nonrheumatic mitral (valve) insufficiency: Secondary | ICD-10-CM | POA: Diagnosis not present

## 2019-08-16 DIAGNOSIS — I440 Atrioventricular block, first degree: Secondary | ICD-10-CM | POA: Diagnosis not present

## 2019-08-16 DIAGNOSIS — I10 Essential (primary) hypertension: Secondary | ICD-10-CM | POA: Diagnosis not present

## 2019-08-23 ENCOUNTER — Ambulatory Visit
Admission: RE | Admit: 2019-08-23 | Discharge: 2019-08-23 | Disposition: A | Discharge status: Home / Self Care | Payer: Medicare Other | Source: Ambulatory Visit | Attending: Internal Medicine | Admitting: Internal Medicine

## 2019-08-23 DIAGNOSIS — Z1231 Encounter for screening mammogram for malignant neoplasm of breast: Secondary | ICD-10-CM | POA: Diagnosis not present

## 2019-09-05 DIAGNOSIS — H401132 Primary open-angle glaucoma, bilateral, moderate stage: Secondary | ICD-10-CM | POA: Diagnosis not present

## 2019-09-10 DIAGNOSIS — H35033 Hypertensive retinopathy, bilateral: Secondary | ICD-10-CM | POA: Diagnosis not present

## 2019-10-03 DIAGNOSIS — C4491 Basal cell carcinoma of skin, unspecified: Secondary | ICD-10-CM

## 2019-10-03 DIAGNOSIS — L578 Other skin changes due to chronic exposure to nonionizing radiation: Secondary | ICD-10-CM | POA: Diagnosis not present

## 2019-10-03 DIAGNOSIS — L821 Other seborrheic keratosis: Secondary | ICD-10-CM | POA: Diagnosis not present

## 2019-10-03 DIAGNOSIS — C44712 Basal cell carcinoma of skin of right lower limb, including hip: Secondary | ICD-10-CM | POA: Diagnosis not present

## 2019-10-03 HISTORY — DX: Basal cell carcinoma of skin, unspecified: C44.91

## 2019-10-05 ENCOUNTER — Other Ambulatory Visit: Payer: Self-pay

## 2019-10-05 ENCOUNTER — Ambulatory Visit (INDEPENDENT_AMBULATORY_CARE_PROVIDER_SITE_OTHER): Payer: Medicare Other

## 2019-10-05 DIAGNOSIS — Z23 Encounter for immunization: Secondary | ICD-10-CM

## 2019-10-17 ENCOUNTER — Other Ambulatory Visit: Payer: Self-pay | Admitting: Internal Medicine

## 2019-10-17 DIAGNOSIS — E785 Hyperlipidemia, unspecified: Secondary | ICD-10-CM

## 2019-10-17 MED ORDER — PRAVASTATIN SODIUM 20 MG PO TABS: 20.0000 mg | ORAL_TABLET | Freq: Every evening | ORAL | 3 refills | Status: DC

## 2019-11-09 DIAGNOSIS — S46211A Strain of muscle, fascia and tendon of other parts of biceps, right arm, initial encounter: Secondary | ICD-10-CM | POA: Diagnosis not present

## 2019-11-09 DIAGNOSIS — M19011 Primary osteoarthritis, right shoulder: Secondary | ICD-10-CM | POA: Diagnosis not present

## 2019-11-09 DIAGNOSIS — M25511 Pain in right shoulder: Secondary | ICD-10-CM | POA: Diagnosis not present

## 2019-11-09 DIAGNOSIS — Z96611 Presence of right artificial shoulder joint: Secondary | ICD-10-CM | POA: Diagnosis not present

## 2019-11-13 ENCOUNTER — Other Ambulatory Visit: Payer: Self-pay

## 2019-11-13 ENCOUNTER — Ambulatory Visit (INDEPENDENT_AMBULATORY_CARE_PROVIDER_SITE_OTHER): Payer: Medicare Other | Admitting: Podiatry

## 2019-11-13 ENCOUNTER — Encounter: Payer: Self-pay | Admitting: Podiatry

## 2019-11-13 DIAGNOSIS — L989 Disorder of the skin and subcutaneous tissue, unspecified: Secondary | ICD-10-CM

## 2019-11-13 DIAGNOSIS — M79676 Pain in unspecified toe(s): Secondary | ICD-10-CM

## 2019-11-13 DIAGNOSIS — B351 Tinea unguium: Secondary | ICD-10-CM | POA: Diagnosis not present

## 2019-11-15 NOTE — Progress Notes (Signed)
    Subjective: Patient is a 86 y.o. female presenting to the office today for follow up evaluation of painful callus lesions noted to the bilateral feet. Bearing weight increases the pain. She has not had any recent treatment.   Patient also complains of elongated, thickened nails that cause pain while ambulating in shoes. She is unable to trim her own nails. Patient presents today for further treatment and evaluation.  Past Medical History:  Diagnosis Date  . Allergic rhinitis   . Arthritis   . CHF (congestive heart failure) ([facility]) 04/2016   developed 2 weeks after identifying AF  . Complication of anesthesia   . Dysrhythmia 04/2016   a fib    . GERD (gastroesophageal reflux disease)    occasionally has reflux/heartburn  . History of pelvic mass    s/p resection, benign  . Hypercholesteremia   . Hypertension   . Mitral regurgitation    noted echo 04/2016   . Osteoporosis    per pt report on Medical Screening Form   . PONV (postoperative nausea and vomiting)   . Pyelonephritis    remote hx   . Shortness of breath dyspnea   . Varicella zoster 2007    Objective:  Physical Exam General: Alert and oriented x3 in no acute distress  Dermatology: Hyperkeratotic lesions present on the bilateral feet. Pain on palpation with a central nucleated core noted. Skin is warm, dry and supple bilateral lower extremities. Negative for open lesions or macerations. Nails are tender, long, thickened and dystrophic with subungual debris, consistent with onychomycosis, 1-5 bilateral. No signs of infection noted.  Vascular: Palpable pedal pulses bilaterally. No edema or erythema noted. Capillary refill within normal limits.  Neurological: Epicritic and protective threshold grossly intact bilaterally.   Musculoskeletal Exam: Pain on palpation at the keratotic lesions noted. Range of motion within normal limits bilateral. Muscle strength 5/5 in all groups bilateral.  Assessment: 1. Onychodystrophic  nails 1-5 bilateral with hyperkeratosis of nails.  2. Onychomycosis of nail due to dermatophyte bilateral 3. Pre-ulcerative callus lesions noted to the bilateral feet x 3   Plan of Care:  1. Patient evaluated. 2. Excisional debridement of keratoic lesions using a chisel blade was performed without incident.  3. Dressed with light dressing. 4. Mechanical debridement of nails 1-5 bilaterally performed using a nail nipper. Filed with dremel without incident.  5. Patient is to return to the clinic in 3 months.    M. , DPM [facility]  Dr.  M. , DPM    [address]                                        [place], [place] [address]                Office [phone]  Fax [phone]   

## 2019-11-20 ENCOUNTER — Telehealth: Payer: Self-pay

## 2019-11-20 ENCOUNTER — Ambulatory Visit (INDEPENDENT_AMBULATORY_CARE_PROVIDER_SITE_OTHER): Payer: Medicare Other | Admitting: Internal Medicine

## 2019-11-20 ENCOUNTER — Other Ambulatory Visit: Payer: Self-pay

## 2019-11-20 ENCOUNTER — Ambulatory Visit (INDEPENDENT_AMBULATORY_CARE_PROVIDER_SITE_OTHER): Payer: Medicare Other

## 2019-11-20 ENCOUNTER — Encounter: Payer: Self-pay | Admitting: Internal Medicine

## 2019-11-20 VITALS — Ht 61.0 in | Wt 188.0 lb

## 2019-11-20 DIAGNOSIS — I10 Essential (primary) hypertension: Secondary | ICD-10-CM | POA: Diagnosis not present

## 2019-11-20 DIAGNOSIS — Z1389 Encounter for screening for other disorder: Secondary | ICD-10-CM

## 2019-11-20 DIAGNOSIS — Z0000 Encounter for general adult medical examination without abnormal findings: Secondary | ICD-10-CM

## 2019-11-20 DIAGNOSIS — R7303 Prediabetes: Secondary | ICD-10-CM

## 2019-11-20 DIAGNOSIS — G8929 Other chronic pain: Secondary | ICD-10-CM | POA: Diagnosis not present

## 2019-11-20 DIAGNOSIS — R0982 Postnasal drip: Secondary | ICD-10-CM | POA: Diagnosis not present

## 2019-11-20 DIAGNOSIS — M25511 Pain in right shoulder: Secondary | ICD-10-CM | POA: Diagnosis not present

## 2019-11-20 DIAGNOSIS — I480 Paroxysmal atrial fibrillation: Secondary | ICD-10-CM | POA: Diagnosis not present

## 2019-11-20 NOTE — Progress Notes (Signed)
telephone Note  I connected with Tina Howard   on 11/20/19 at  2:00 PM EST by telephone and verified that I am speaking with the correct person using two identifiers.  Location patient: home Location provider:work or home office Persons participating in the virtual visit: patient, provider  I discussed the limitations of evaluation and management by telemedicine and the availability of in person appointments. The patient expressed understanding and agreed to proceed.   HPI: 1. HTN BP last checked 08/16/19 150/70 on dilt 120 mg qd, losartan 100 mg qd, hydralazine 25 mg bid not checking BP  2. H/o Afib f/u Dr. Shelba Flake appt sch 01/2020  3. Chronic right shoulder pain Xray 11/09/2019 normal but ? Rupture of biceps tendon as pt has bump in upper arm s/p right shoulder replacement appt sch ortho [facility] 12/10/2019 bump is improving and less in size  4. C/o cough and pnd denies GERD sx's also has nasal congestion will try nasal saline and declines to try atrovent at this time     ROS: See pertinent positives and negatives per HPI.  Past Medical History:  Diagnosis Date  . Allergic rhinitis   . Arthritis   . CHF (congestive heart failure) ([place]) 04/2016   developed 2 weeks after identifying AF  . Complication of anesthesia   . Dysrhythmia 04/2016   a fib    . GERD (gastroesophageal reflux disease)    occasionally has reflux/heartburn  . History of pelvic mass    s/p resection, benign  . Hypercholesteremia   . Hypertension   . Mitral regurgitation    noted echo 04/2016   . Osteoporosis    per pt report on Medical Screening Form   . PONV (postoperative nausea and vomiting)   . Pyelonephritis    remote hx   . Shortness of breath dyspnea   . Varicella zoster 2007    Past Surgical History:  Procedure Laterality Date  . [place]   lower back  . bladder prolapse repair    . BREAST CYST EXCISION Left    1990s  . CATARACT EXTRACTION Bilateral 2014   Dr. Sandra Cockayne  .  ELECTROPHYSIOLOGIC STUDY N/A 06/15/2016   Procedure: CARDIOVERSION;  Surgeon: [name], MD;  Location: [facility] ORS;  Service: Cardiovascular;  Laterality: N/A;  . EYE SURGERY     bilateral cataract  . FOOT SURGERY    . HERNIA REPAIR  2013   inguinal  . JOINT REPLACEMENT  [id number]   bilateral knee replacements  . KNEE ARTHROSCOPY     right  . RECTOCELE REPAIR    . REVERSE SHOULDER ARTHROPLASTY Right 06/27/2018   Procedure: REVERSE SHOULDER ARTHROPLASTY;  Surgeon: Corky Mull, MD;  Location: [facility] ORS;  Service: Orthopedics;  Laterality: Right;  . TOTAL KNEE ARTHROPLASTY     left  . VAGINAL HYSTERECTOMY  1970   benign reasons    Family History  Problem Relation Age of Onset  . Emphysema Mother   . Heart disease Father   . Early death Father        died 81 y.o MI   . Breast cancer Maternal Aunt 69    SOCIAL HX:  Widowed. Very active within her community. Enjoys singing, being active in her church, spending time with family. Lives with son Catalina [place] 1 son in [place] 2 sons in   Current Outpatient Medications:  .  acetaminophen (TYLENOL) 500 MG tablet, Take 500-1,000 mg by mouth 2 (two) times daily as needed for  moderate pain or headache., Disp: , Rfl:  .  Cholecalciferol (VITAMIN D3) 2000 units capsule, Take 1,000 Units by mouth daily. , Disp: , Rfl:  .  diclofenac sodium (VOLTAREN) 1 % GEL, Apply 1 application topically as needed (foot pain)., Disp: 100 g, Rfl: 3 .  diltiazem (TIAZAC) 120 MG 24 hr capsule, Take 120 mg by mouth once daily at night, Disp: , Rfl:  .  docusate calcium (SURFAK) 240 MG capsule, Take 240 mg by mouth at bedtime. , Disp: , Rfl:  .  flecainide (TAMBOCOR) 50 MG tablet, Take 1 tablet (50 mg total) by mouth 2 (two) times daily., Disp: 60 tablet, Rfl: 4 .  furosemide (LASIX) 40 MG tablet, Take 40 mg by mouth daily. , Disp: , Rfl:  .  losartan (COZAAR) 100 MG tablet, Take 1 tablet (100 mg total) by mouth daily., Disp: 90 tablet, Rfl: 3 .  mupirocin  ointment (BACTROBAN) 2 %, Apply 1 application topically 2 (two) times daily. X 1 week, Disp: 30 g, Rfl: 0 .  potassium chloride (K-DUR,KLOR-CON) 20 MEQ tablet, Take 1 tablet (20 mEq total) by mouth 2 (two) times daily., Disp: 180 tablet, Rfl: 0 .  pravastatin (PRAVACHOL) 20 MG tablet, Take 1 tablet (20 mg total) by mouth every evening., Disp: 90 tablet, Rfl: 3 .  rivaroxaban (XARELTO) 20 MG TABS tablet, Take 1 tablet (20 mg total) by mouth daily with supper., Disp: 10 tablet, Rfl: 0 .  timolol (TIMOPTIC) 0.5 % ophthalmic solution, , Disp: , Rfl:  .  hydrALAZINE (APRESOLINE) 25 MG tablet, Take 1 tablet (25 mg total) by mouth 3 (three) times daily. (Patient taking differently: Take 25 mg by mouth 2 (two) times daily. ), Disp: 180 tablet, Rfl: 1  EXAM:  VITALS per patient if applicable:  GENERAL: alert, oriented, appears well and in no acute distress  PSYCH/NEURO: pleasant and cooperative, no obvious depression or anxiety, speech and thought processing grossly intact  ASSESSMENT AND PLAN:  Discussed the following assessment and plan:  Essential hypertension - Plan: Comprehensive metabolic panel, Lipid panel, CBC with Differential/Platelet Cont meds monitor BP  BP check upcoming with fasting labs   Prediabetes - Plan: HgB A1c  Paroxysmal A-fib ([place]) F/u 01/2020   PND -will try NS  -also disc atrovent declines for now   Chronic right shoulder pain Ortho appt upcoming 12/11/19 ? Ruptured biceps tendon Xray 11/09/19 normal s/p right total shoulder replacement   HM UTD vaccines(I.e prevnar, Tdap had 06/27/12) -flu shot utd 10/05/19 rec shingrix vaccine pna 23 at f/u  Colonoscopy had 12/29/14colonic angioectasia/IH no f/u rec  mammo 08/23/19 neg  DEXA8/4/20 osteopenia vit d and calcium rec Out of pap windows/p hysterectomy then ovaries removed in 2002  dermatology tbse Dr. Ruthell Rummage 05/03/2019 at 10:30 am -saw 05/10/2019 DN, ISK right cheek x 2, AK right cheek x 1 f/u in 6  months    -we discussed possible serious and likely etiologies, options for evaluation and workup, limitations of telemedicine visit vs in person visit, treatment, treatment risks and precautions. Pt prefers to treat via telemedicine empirically rather then risking or undertaking an in person visit at this moment. Patient agrees to seek prompt in person care if worsening, new symptoms arise, or if is not improving with treatment.   I discussed the assessment and treatment plan with the patient. The patient was provided an opportunity to ask questions and all were answered. The patient agreed with the plan and demonstrated an understanding of the instructions.  The patient was advised to call back or seek an in-person evaluation if the symptoms worsen or if the condition fails to improve as anticipated.  Time 15-20 minutes  Delorise Jackson, MD

## 2019-11-20 NOTE — Telephone Encounter (Signed)
I called and spoke with the patient and scheduled fasting labs and a nurse visit for Pneumonia and BP check.  Tina Howard,cma

## 2019-11-20 NOTE — Patient Instructions (Addendum)
  Tina Howard , Thank you for taking time to come for your Medicare Wellness Visit. I appreciate your ongoing commitment to your health goals. Please review the following plan we discussed and let me know if I can assist you in the future.   These are the goals we discussed: Goals      Patient Stated   . Increase physical activity (pt-stated)     I want to do leg strengthening exercises like marching in place       This is a list of the screening recommended for you and due dates:  Health Maintenance  Topic Date Due  . Tetanus Vaccine  06/27/2022  . Flu Shot  Completed  . DEXA scan (bone density measurement)  Completed  . Pneumonia vaccines  Completed

## 2019-11-20 NOTE — Progress Notes (Addendum)
Subjective:   Tina Howard is a 85 y.o. female who presents for Medicare Annual (Subsequent) preventive examination.  Review of Systems:  No ROS.  Medicare Wellness Virtual Visit.  Visual/audio telehealth visit, UTA vital signs.   See social history for additional risk factors.   Cardiac Risk Factors include: advanced age (>2men, >100 women);hypertension     Objective:     Vitals: There were no vitals taken for this visit.  There is no height or weight on file to calculate BMI.  Advanced Directives 11/20/2019 12/08/2018 11/15/2018 06/27/2018 06/20/2018 11/08/2017 10/25/2016  Does Patient Have a Medical Advance Directive? Yes Yes Yes Yes Yes Yes Yes  Type of Paramedic of [place];Living will [place];Living will [place];Living will [place];Living will [place];Living will Living will;Healthcare Power of [place];Living will  Does patient want to make changes to medical advance directive? No - Patient declined No - Patient declined No - Patient declined No - Patient declined No - Patient declined No - Patient declined -  Copy of [place] in Chart? Yes - validated most recent copy scanned in chart (See row information) No - copy requested Yes - validated most recent copy scanned in chart (See row information) Yes Yes No - copy requested No - copy requested    Tobacco Social History   Tobacco Use  Smoking Status Never Smoker  Smokeless Tobacco Never Used     Counseling given: Not Answered   Clinical Intake:  Pre-visit preparation completed: Yes        Diabetes: No  How often do you need to have someone help you when you read instructions, pamphlets, or other written materials from your doctor or pharmacy?: 1 - Never  Interpreter Needed?: No     Past Medical History:  Diagnosis Date  . Allergic rhinitis   .  Arthritis   . CHF (congestive heart failure) ([place]) 04/2016   developed 2 weeks after identifying AF  . Complication of anesthesia   . Dysrhythmia 04/2016   a fib    . GERD (gastroesophageal reflux disease)    occasionally has reflux/heartburn  . History of pelvic mass    s/p resection, benign  . Hypercholesteremia   . Hypertension   . Mitral regurgitation    noted echo 04/2016   . Osteoporosis    per pt report on Medical Screening Form   . PONV (postoperative nausea and vomiting)   . Pyelonephritis    remote hx   . Shortness of breath dyspnea   . Varicella zoster 2007   Past Surgical History:  Procedure Laterality Date  . [place]   lower back  . bladder prolapse repair    . BREAST CYST EXCISION Left    1990s  . CATARACT EXTRACTION Bilateral 2014   Dr. Sandra Cockayne  . ELECTROPHYSIOLOGIC STUDY N/A 06/15/2016   Procedure: CARDIOVERSION;  Surgeon: [name], MD;  Location: [facility] ORS;  Service: Cardiovascular;  Laterality: N/A;  . EYE SURGERY     bilateral cataract  . FOOT SURGERY    . HERNIA REPAIR  2013   inguinal  . JOINT REPLACEMENT  [id number]   bilateral knee replacements  . KNEE ARTHROSCOPY     right  . RECTOCELE REPAIR    . REVERSE SHOULDER ARTHROPLASTY Right 06/27/2018   Procedure: REVERSE SHOULDER ARTHROPLASTY;  Surgeon: Corky Mull, MD;  Location: [facility] ORS;  Service: Orthopedics;  Laterality: Right;  . TOTAL KNEE ARTHROPLASTY     left  . VAGINAL HYSTERECTOMY  1970   benign reasons   Family History  Problem Relation Age of Onset  . Emphysema Mother   . Heart disease Father   . Early death Father        died 59 y.o MI   . Breast cancer Maternal Aunt 80   Social History   Socioeconomic History  . Marital status: Widowed    Spouse name: Not on file  . Number of children: Not on file  . Years of education: Not on file  . Highest education level: Not on file  Occupational History  . Not on file  Social Needs  . Financial resource  strain: Not hard at all  . Food insecurity    Worry: Never true    Inability: Never true  . Transportation needs    Medical: No    Non-medical: No  Tobacco Use  . Smoking status: Never Smoker  . Smokeless tobacco: Never Used  Substance and Sexual Activity  . Alcohol use: Yes    Alcohol/week: 2.0 standard drinks    Types: 2 Glasses of wine per week    Comment: occassional  . Drug use: No  . Sexual activity: Never  Lifestyle  . Physical activity    Days per week: 2 days    Minutes per session: 60 min  . Stress: Not at all  Relationships  . Social Herbalist on phone: Not on file    Gets together: Not on file    Attends religious service: Not on file    Active member of club or organization: Not on file    Attends meetings of clubs or organizations: Not on file    Relationship status: Widowed  Other Topics Concern  . Not on file  Social History Narrative   Widowed.   Very active within her community.   Enjoys singing, being active in her church, spending time with family.   Lives with son    Outpatient Encounter Medications as of 11/20/2019  Medication Sig  . acetaminophen (TYLENOL) 500 MG tablet Take 500-1,000 mg by mouth 2 (two) times daily as needed for moderate pain or headache.  . Cholecalciferol (VITAMIN D3) 2000 units capsule Take 1,000 Units by mouth daily.   . diclofenac sodium (VOLTAREN) 1 % GEL Apply 1 application topically as needed (foot pain).  Marland Kitchen diltiazem (TIAZAC) 120 MG 24 hr capsule Take 120 mg by mouth once daily at night  . docusate calcium (SURFAK) 240 MG capsule Take 240 mg by mouth at bedtime.   . flecainide (TAMBOCOR) 50 MG tablet Take 1 tablet (50 mg total) by mouth 2 (two) times daily.  . furosemide (LASIX) 40 MG tablet Take 40 mg by mouth daily.   Marland Kitchen losartan (COZAAR) 100 MG tablet Take 1 tablet (100 mg total) by mouth daily.  . mupirocin ointment (BACTROBAN) 2 % Apply 1 application topically 2 (two) times daily. X 1 week  . potassium  chloride (K-DUR,KLOR-CON) 20 MEQ tablet Take 1 tablet (20 mEq total) by mouth 2 (two) times daily.  . pravastatin (PRAVACHOL) 20 MG tablet Take 1 tablet (20 mg total) by mouth every evening.  . rivaroxaban (XARELTO) 20 MG TABS tablet Take 1 tablet (20 mg total) by mouth daily with supper.  . timolol (TIMOPTIC) 0.5 % ophthalmic solution   . hydrALAZINE (APRESOLINE) 25 MG tablet Take 1 tablet (25 mg total)  by mouth 3 (three) times daily. (Patient taking differently: Take 25 mg by mouth 2 (two) times daily. )  . oseltamivir (TAMIFLU) 75 MG capsule    No facility-administered encounter medications on file as of 11/20/2019.     Activities of Daily Living In your present state of health, do you have any difficulty performing the following activities: 11/20/2019  Hearing? Y  Comment Hearing aid, bilateral  Vision? N  Difficulty concentrating or making decisions? N  Walking or climbing stairs? N  Dressing or bathing? N  Doing errands, shopping? N  Preparing Food and eating ? N  Using the Toilet? N  In the past six months, have you accidently leaked urine? N  Do you have problems with loss of bowel control? N  Managing your Medications? N  Managing your Finances? N  Housekeeping or managing your Housekeeping? N  Some recent data might be hidden    Patient Care Team: McLean-Scocuzza, Nino Glow, MD as PCP - General (Internal Medicine)    Assessment:   This is a routine wellness examination for Tina Howard.  Nurse connected with patient 11/20/19 at 11:30 AM EST by a telephone enabled telemedicine application and verified that I am speaking with the correct person using two identifiers. Patient stated full name and DOB. Patient gave permission to continue with virtual visit. Patient's location was at home and Nurse's location was at [place] office.   Health Maintenance Due: See completed HM at the end of note.   Eye: Visual acuity not assessed. Virtual visit. Wears corrective lenses. Followed by  their ophthalmologist every  months.   Dental: Visits every 6 months.    Hearing: Hearing aids- yes  Safety:  Patient feels safe at home- yes Patient does have smoke detectors at home- yes Patient does wear sunscreen or protective clothing when in direct sunlight - yes Patient does wear seat belt when in a moving vehicle - yes Patient drives- limited  Adequate lighting in walkways free from debris- yes Grab bars and handrails used as appropriate- yes Ambulates with no assistive device Cell phone on person when ambulating outside of the home- yes  Social: Alcohol intake - yes      Smoking history- never   Smokers in home? none Illicit drug use? none  Depression: PHQ 2 &9 complete. See screening below. Denies irritability, anhedonia, sadness/tearfullness.  Stable.   Falls: See screening below.  None since last reported to pcp 6 months ago.  Medication: Taking as directed and without issues.   Covid-19: Precautions and sickness symptoms discussed. Wears mask, social distancing, hand hygiene as appropriate.   Activities of Daily Living Patient denies needing assistance with: household chores, feeding themselves, getting from bed to chair, getting to the toilet, bathing/showering, dressing, managing money, or preparing meals.   Memory: Patient is alert. Patient denies difficulty focusing or concentrating. Correctly identified the president of the [place], season and recall.  Patient likes to read the Bible and sing for brain stimulation.   BMI- discussed the importance of a healthy diet, water intake and the benefits of aerobic exercise.  Educational material provided.  Physical activity- no  routine  Diet:  Low carb diet Water: good intake  Other Providers Patient Care Team: McLean-Scocuzza, Nino Glow, MD as PCP - General (Internal Medicine)  Exercise Activities and Dietary recommendations Current Exercise Habits: The patient does not participate in regular exercise  at present  Goals      Patient Stated   . Increase physical activity (pt-stated)  I want to do leg strengthening exercises like marching in place       Fall Risk Fall Risk  11/20/2019 11/15/2018 09/20/2018 06/19/2018 11/08/2017  Falls in the past year? 0 1 No No Yes  Number falls in past yr: - 1 - - 1  Injury with Fall? - 0 - - No  Comment - No falls since last 3 months ago. Accidentally knocked down; sought medical attn. - - -  Follow up Falls prevention discussed;Education provided - - - Education provided;Falls prevention discussed   Timed Get Up and Go performed: no, virtual visit  Depression Screen PHQ 2/9 Scores 11/20/2019 11/15/2018 09/20/2018 06/19/2018  PHQ - 2 Score 0 0 0 0  PHQ- 9 Score - - - -     Cognitive Function MMSE - Mini Mental State Exam 10/25/2016  Orientation to time 5  Orientation to Place 5  Registration 3  Attention/ Calculation 5  Recall 3  Language- name 2 objects 2  Language- repeat 1  Language- follow 3 step command 3  Language- read & follow direction 1  Write a sentence 1  Copy design 1  Total score 30     6CIT Screen 11/20/2019 11/15/2018 11/08/2017  What Year? 0 points 0 points 0 points  What month? 0 points 0 points 0 points  What time? 0 points 0 points 0 points  Count back from 20 0 points 0 points 0 points  Months in reverse 0 points 0 points 0 points  Repeat phrase 0 points 0 points 0 points  Total Score 0 0 0    Immunization History  Administered Date(s) Administered  . Fluad Quad(high Dose 65+) 10/05/2019  . Influenza Split 09/24/2011, 10/16/2012  . Influenza, High Dose Seasonal PF 10/20/2017, 08/29/2018  . Influenza,inj,Quad PF,6+ Mos 10/30/2013, 11/13/2014, 11/13/2015  . Influenza-Unspecified 10/06/2016  . Pneumococcal Conjugate-13 05/08/2014  . Pneumococcal Polysaccharide-23 09/24/2011  . Tdap 06/27/2012   Screening Tests Health Maintenance  Topic Date Due  . TETANUS/TDAP  06/27/2022  . INFLUENZA VACCINE   Completed  . DEXA SCAN  Completed  . PNA vac Low Risk Adult  Completed      Plan:   Keep all routine maintenance appointments.   Follow up with your doctor today @ 2:00  Medicare Attestation I have personally reviewed: The patient's medical and social history Their use of alcohol, tobacco or illicit drugs Their current medications and supplements The patient's functional ability including ADLs,fall risks, home safety risks, cognitive, and hearing and visual impairment Diet and physical activities Evidence for depression   In addition, I have reviewed and discussed with patient certain preventive protocols, quality metrics, and best practice recommendations. A written personalized care plan for preventive services as well as general preventive health recommendations were provided to patient via mail.     Varney Biles, LPN  [phone]   Agree  [place]

## 2019-11-20 NOTE — Telephone Encounter (Signed)
-----   Message from Delorise Jackson, MD sent at 11/20/2019  2:35 PM EST ----- Sch fasting labs asap same day sch bp check nurse visit and pna 23 vaccine

## 2019-11-21 ENCOUNTER — Other Ambulatory Visit: Payer: Self-pay

## 2019-11-28 ENCOUNTER — Ambulatory Visit (INDEPENDENT_AMBULATORY_CARE_PROVIDER_SITE_OTHER): Payer: Medicare Other | Admitting: *Deleted

## 2019-11-28 ENCOUNTER — Encounter: Payer: Self-pay | Admitting: *Deleted

## 2019-11-28 ENCOUNTER — Other Ambulatory Visit: Payer: Self-pay

## 2019-11-28 ENCOUNTER — Telehealth: Payer: Self-pay

## 2019-11-28 ENCOUNTER — Other Ambulatory Visit (INDEPENDENT_AMBULATORY_CARE_PROVIDER_SITE_OTHER): Payer: Medicare Other

## 2019-11-28 VITALS — BP 140/84 | HR 67 | Resp 16

## 2019-11-28 DIAGNOSIS — Z23 Encounter for immunization: Secondary | ICD-10-CM | POA: Diagnosis not present

## 2019-11-28 DIAGNOSIS — R7303 Prediabetes: Secondary | ICD-10-CM | POA: Diagnosis not present

## 2019-11-28 DIAGNOSIS — I10 Essential (primary) hypertension: Secondary | ICD-10-CM

## 2019-11-28 DIAGNOSIS — Z1389 Encounter for screening for other disorder: Secondary | ICD-10-CM

## 2019-11-28 LAB — COMPREHENSIVE METABOLIC PANEL
ALT: 12 U/L (ref 0–35)
AST: 14 U/L (ref 0–37)
Albumin: 4.2 g/dL (ref 3.5–5.2)
Alkaline Phosphatase: 54 U/L (ref 39–117)
BUN: 20 mg/dL (ref 6–23)
CO2: 32 mEq/L (ref 19–32)
Calcium: 9 mg/dL (ref 8.4–10.5)
Chloride: 102 mEq/L (ref 96–112)
Creatinine, Ser: 0.5 mg/dL (ref 0.40–1.20)
GFR: 117.4 mL/min (ref >60.00)
Glucose, Bld: 115 mg/dL — ABNORMAL HIGH (ref 70–99)
Potassium: 3.6 mEq/L (ref 3.5–5.1)
Sodium: 142 mEq/L (ref 135–145)
Total Bilirubin: 0.5 mg/dL (ref 0.2–1.2)
Total Protein: 6.3 g/dL (ref 6.0–8.3)

## 2019-11-28 LAB — CBC WITH DIFFERENTIAL/PLATELET
Basophils Absolute: 0 10*3/uL (ref 0.0–0.1)
Basophils Relative: 0.6 % (ref 0.0–3.0)
Eosinophils Absolute: 0.1 10*3/uL (ref 0.0–0.7)
Eosinophils Relative: 2 % (ref 0.0–5.0)
HCT: 36.9 % (ref 36.0–46.0)
Hemoglobin: 12.2 g/dL (ref 12.0–15.0)
Lymphocytes Relative: 24.7 % (ref 12.0–46.0)
Lymphs Abs: 1.5 10*3/uL (ref 0.7–4.0)
MCHC: 33 g/dL (ref 30.0–36.0)
MCV: 84.9 fl (ref 78.0–100.0)
Monocytes Absolute: 0.5 10*3/uL (ref 0.1–1.0)
Monocytes Relative: 9.1 % (ref 3.0–12.0)
Neutro Abs: 3.7 10*3/uL (ref 1.4–7.7)
Neutrophils Relative %: 63.6 % (ref 43.0–77.0)
Platelets: 237 10*3/uL (ref 150.0–400.0)
RBC: 4.34 Mil/uL (ref 3.87–5.11)
RDW: 14.2 % (ref 11.5–15.5)
WBC: 5.9 10*3/uL (ref 4.0–10.5)

## 2019-11-28 LAB — LIPID PANEL
Cholesterol: 201 mg/dL — ABNORMAL HIGH (ref 0–200)
HDL: 63.9 mg/dL (ref >39.00)
LDL Cholesterol: 109 mg/dL — ABNORMAL HIGH (ref 0–99)
NonHDL: 136.65
Total CHOL/HDL Ratio: 3
Triglycerides: 139 mg/dL (ref 0.0–149.0)
VLDL: 27.8 mg/dL (ref 0.0–40.0)

## 2019-11-28 LAB — HEMOGLOBIN A1C: Hgb A1c MFr Bld: 5.7 % (ref 4.6–6.5)

## 2019-11-28 NOTE — Telephone Encounter (Signed)
-----   Message from Delorise Jackson, MD sent at 11/28/2019  3:20 PM EST -----   ----- Message ----- From: Nanci Pina, RN Sent: 11/28/2019   3:16 PM EST To: Nino Glow McLean-Scocuzza, MD

## 2019-11-28 NOTE — Progress Notes (Signed)
I will tolerate this BP for patient today  Cont meds as she is taking them for BP  Reviewed   [place]

## 2019-11-28 NOTE — Telephone Encounter (Signed)
Left message for patient to return call back. [facility] may give information.  

## 2019-11-28 NOTE — Progress Notes (Signed)
Patient here for nurse visit BP check per order from 11/20/19.   Patient reports compliance with prescribed BP medications: yes  Last dose of BP medication: 0630 patient took losartan 100 mg, Furosemide 40 mg, hydralazine 25 mg   BP Readings from Last 3 Encounters:  05/08/19 140/80  12/08/18 (!) 177/80  11/28/18 122/74   Pulse Readings from Last 3 Encounters:  05/08/19 67  12/08/18 71  11/28/18 75        Kerin Salen, RN

## 2019-11-29 LAB — URINALYSIS, ROUTINE W REFLEX MICROSCOPIC
Bilirubin Urine: NEGATIVE
Glucose, UA: NEGATIVE
Hgb urine dipstick: NEGATIVE
Ketones, ur: NEGATIVE
Leukocytes,Ua: NEGATIVE
Nitrite: NEGATIVE
Protein, ur: NEGATIVE
Specific Gravity, Urine: 1.012 (ref 1.001–1.03)
pH: 7.5 (ref 5.0–8.0)

## 2019-11-30 NOTE — Telephone Encounter (Signed)
Message sent to patient to mychart. Tina Howard,cma

## 2019-12-10 DIAGNOSIS — H35033 Hypertensive retinopathy, bilateral: Secondary | ICD-10-CM | POA: Diagnosis not present

## 2019-12-10 DIAGNOSIS — H35353 Cystoid macular degeneration, bilateral: Secondary | ICD-10-CM | POA: Diagnosis not present

## 2019-12-31 ENCOUNTER — Encounter: Payer: Self-pay | Admitting: Internal Medicine

## 2019-12-31 ENCOUNTER — Other Ambulatory Visit: Payer: Self-pay | Admitting: Internal Medicine

## 2019-12-31 DIAGNOSIS — I10 Essential (primary) hypertension: Secondary | ICD-10-CM

## 2019-12-31 MED ORDER — HYDRALAZINE HCL 25 MG PO TABS: 25.0000 mg | ORAL_TABLET | Freq: Two times a day (BID) | ORAL | 0 refills | Status: DC

## 2019-12-31 MED ORDER — HYDRALAZINE HCL 25 MG PO TABS: 25.0000 mg | ORAL_TABLET | Freq: Two times a day (BID) | ORAL | 3 refills | Status: DC

## 2020-02-06 DIAGNOSIS — Z23 Encounter for immunization: Secondary | ICD-10-CM | POA: Diagnosis not present

## 2020-02-11 DIAGNOSIS — Z96611 Presence of right artificial shoulder joint: Secondary | ICD-10-CM | POA: Diagnosis not present

## 2020-02-11 DIAGNOSIS — S46211D Strain of muscle, fascia and tendon of other parts of biceps, right arm, subsequent encounter: Secondary | ICD-10-CM | POA: Diagnosis not present

## 2020-02-12 ENCOUNTER — Ambulatory Visit (INDEPENDENT_AMBULATORY_CARE_PROVIDER_SITE_OTHER): Payer: Medicare Other | Admitting: Podiatry

## 2020-02-12 ENCOUNTER — Other Ambulatory Visit: Payer: Self-pay

## 2020-02-12 DIAGNOSIS — M79676 Pain in unspecified toe(s): Secondary | ICD-10-CM

## 2020-02-12 DIAGNOSIS — L989 Disorder of the skin and subcutaneous tissue, unspecified: Secondary | ICD-10-CM | POA: Diagnosis not present

## 2020-02-12 DIAGNOSIS — M79609 Pain in unspecified limb: Secondary | ICD-10-CM

## 2020-02-12 DIAGNOSIS — B351 Tinea unguium: Secondary | ICD-10-CM

## 2020-02-12 DIAGNOSIS — M7752 Other enthesopathy of left foot: Secondary | ICD-10-CM

## 2020-02-14 NOTE — Progress Notes (Signed)
    Subjective: Patient is a 86 y.o. female presenting to the office today for follow up evaluation of painful callus lesion(s) noted to the bilateral feet. Bearing weight increases the pain. She has not done anything at home for treatment.  She also complains of pain to the left 5th toe that began a few months ago. Walking and wearing shoes increases the pain. She has been applying Voltaren gel for treatment with some relief.  Patient also complains of elongated, thickened nails that cause pain while ambulating in shoes. She is unable to trim her own nails. Patient presents today for further treatment and evaluation.  Past Medical History:  Diagnosis Date  . Allergic rhinitis   . Arthritis   . CHF (congestive heart failure) ([place]) 04/2016   developed 2 weeks after identifying AF  . Complication of anesthesia   . Dysrhythmia 04/2016   a fib    . GERD (gastroesophageal reflux disease)    occasionally has reflux/heartburn  . History of pelvic mass    s/p resection, benign  . Hypercholesteremia   . Hypertension   . Mitral regurgitation    noted echo 04/2016   . Osteoporosis    per pt report on Medical Screening Form   . PONV (postoperative nausea and vomiting)   . Pyelonephritis    remote hx   . Shortness of breath dyspnea   . Varicella zoster 2007    Objective:  Physical Exam General: Alert and oriented x3 in no acute distress  Dermatology: Hyperkeratotic lesion(s) present on the bilateral feet. Pain on palpation with a central nucleated core noted. Skin is warm, dry and supple bilateral lower extremities. Negative for open lesions or macerations. Nails are tender, long, thickened and dystrophic with subungual debris, consistent with onychomycosis, 1-5 bilateral. No signs of infection noted.  Vascular: Palpable pedal pulses bilaterally. No edema or erythema noted. Capillary refill within normal limits.  Neurological: Epicritic and protective threshold grossly intact bilaterally.     Musculoskeletal Exam: Pain on palpation at the keratotic lesion(s) noted as well as the 5th MPJ of the left foot. Range of motion within normal limits bilateral. Muscle strength 5/5 in all groups bilateral.  Assessment: 1. Onychodystrophic nails 1-5 bilateral with hyperkeratosis of nails.  2. Onychomycosis of nail due to dermatophyte bilateral 3. Pre-ulcerative callus lesions noted to the bilateral feet x 4   Plan of Care:  1. Patient evaluated. 2. Excisional debridement of keratoic lesion(s) using a chisel blade was performed without incident.  3. Dressed with light dressing. 4. Mechanical debridement of nails 1-5 bilaterally performed using a nail nipper. Filed with dremel without incident.  5. Injection of 0.5 mLs Celestone Soluspan injected into the 5th MPJ of the left foot.  6. Patient is to return to the clinic in 3 months.   Edrick Kins, DPM [facility]  Dr. Edrick Kins, [place]                                        [place], [place] [address]                Office [phone]  Fax [phone]

## 2020-02-19 DIAGNOSIS — R7303 Prediabetes: Secondary | ICD-10-CM | POA: Diagnosis not present

## 2020-02-19 DIAGNOSIS — I440 Atrioventricular block, first degree: Secondary | ICD-10-CM | POA: Diagnosis not present

## 2020-02-19 DIAGNOSIS — I340 Nonrheumatic mitral (valve) insufficiency: Secondary | ICD-10-CM | POA: Diagnosis not present

## 2020-02-19 DIAGNOSIS — I10 Essential (primary) hypertension: Secondary | ICD-10-CM | POA: Diagnosis not present

## 2020-02-19 DIAGNOSIS — I480 Paroxysmal atrial fibrillation: Secondary | ICD-10-CM | POA: Diagnosis not present

## 2020-03-18 ENCOUNTER — Other Ambulatory Visit: Payer: Self-pay

## 2020-03-20 ENCOUNTER — Other Ambulatory Visit: Payer: Self-pay

## 2020-03-20 ENCOUNTER — Ambulatory Visit (INDEPENDENT_AMBULATORY_CARE_PROVIDER_SITE_OTHER): Payer: Medicare Other | Admitting: Internal Medicine

## 2020-03-20 ENCOUNTER — Encounter: Payer: Self-pay | Admitting: Internal Medicine

## 2020-03-20 VITALS — BP 124/70 | HR 86 | Temp 97.2°F | Ht 61.0 in | Wt 191.0 lb

## 2020-03-20 DIAGNOSIS — L304 Erythema intertrigo: Secondary | ICD-10-CM | POA: Diagnosis not present

## 2020-03-20 DIAGNOSIS — I10 Essential (primary) hypertension: Secondary | ICD-10-CM | POA: Diagnosis not present

## 2020-03-20 DIAGNOSIS — R0602 Shortness of breath: Secondary | ICD-10-CM

## 2020-03-20 DIAGNOSIS — E785 Hyperlipidemia, unspecified: Secondary | ICD-10-CM | POA: Diagnosis not present

## 2020-03-20 DIAGNOSIS — Z1231 Encounter for screening mammogram for malignant neoplasm of breast: Secondary | ICD-10-CM | POA: Diagnosis not present

## 2020-03-20 DIAGNOSIS — L570 Actinic keratosis: Secondary | ICD-10-CM | POA: Diagnosis not present

## 2020-03-20 LAB — CBC WITH DIFFERENTIAL/PLATELET
Basophils Absolute: 0 10*3/uL (ref 0.0–0.1)
Basophils Relative: 0.6 % (ref 0.0–3.0)
Eosinophils Absolute: 0.1 10*3/uL (ref 0.0–0.7)
Eosinophils Relative: 2.2 % (ref 0.0–5.0)
HCT: 38.6 % (ref 36.0–46.0)
Hemoglobin: 12.9 g/dL (ref 12.0–15.0)
Lymphocytes Relative: 24.2 % (ref 12.0–46.0)
Lymphs Abs: 1.5 10*3/uL (ref 0.7–4.0)
MCHC: 33.4 g/dL (ref 30.0–36.0)
MCV: 84.7 fl (ref 78.0–100.0)
Monocytes Absolute: 0.5 10*3/uL (ref 0.1–1.0)
Monocytes Relative: 8.5 % (ref 3.0–12.0)
Neutro Abs: 3.9 10*3/uL (ref 1.4–7.7)
Neutrophils Relative %: 64.5 % (ref 43.0–77.0)
Platelets: 253 10*3/uL (ref 150.0–400.0)
RBC: 4.55 Mil/uL (ref 3.87–5.11)
RDW: 14 % (ref 11.5–15.5)
WBC: 6.1 10*3/uL (ref 4.0–10.5)

## 2020-03-20 LAB — LIPID PANEL
Cholesterol: 202 mg/dL — ABNORMAL HIGH (ref 0–200)
HDL: 61.3 mg/dL (ref >39.00)
LDL Cholesterol: 112 mg/dL — ABNORMAL HIGH (ref 0–99)
NonHDL: 140.52
Total CHOL/HDL Ratio: 3
Triglycerides: 142 mg/dL (ref 0.0–149.0)
VLDL: 28.4 mg/dL (ref 0.0–40.0)

## 2020-03-20 LAB — COMPREHENSIVE METABOLIC PANEL
ALT: 13 U/L (ref 0–35)
AST: 15 U/L (ref 0–37)
Albumin: 4.2 g/dL (ref 3.5–5.2)
Alkaline Phosphatase: 52 U/L (ref 39–117)
BUN: 21 mg/dL (ref 6–23)
CO2: 32 mEq/L (ref 19–32)
Calcium: 9.1 mg/dL (ref 8.4–10.5)
Chloride: 102 mEq/L (ref 96–112)
Creatinine, Ser: 0.58 mg/dL (ref 0.40–1.20)
GFR: 98.84 mL/min (ref >60.00)
Glucose, Bld: 131 mg/dL — ABNORMAL HIGH (ref 70–99)
Potassium: 3.3 mEq/L — ABNORMAL LOW (ref 3.5–5.1)
Sodium: 141 mEq/L (ref 135–145)
Total Bilirubin: 0.5 mg/dL (ref 0.2–1.2)
Total Protein: 6.8 g/dL (ref 6.0–8.3)

## 2020-03-20 NOTE — Progress Notes (Signed)
Chief Complaint  Patient presents with  . Follow-up   F/u  1. HTN controlled today 124/70 on dilt 120 mg qd, hydralazine 25 tid, losartan 100 mg qd, lasix 40 mg qd  hld on pravastatin 20 mg qhs 2. Sob with sleeping at times and taking a deep breath at times she is not sure if this is due to the fact she is used to taking deep breaths with singing agreeable to do a CXR  Reviewed echo 12/2017 with EF 45-50% mild valve d/o and mild LVH  3. 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 Afib hx on flecainide 50, dilt 120 mg qd, xarelto f/u cards last seen [facility] cards 03/07/20, 02/19/20   Review of Systems  Constitutional: Negative for weight loss.  HENT: Negative for hearing loss.   Eyes: Negative for blurred vision.  Respiratory: Positive for shortness of breath.        Sob with exertion   Cardiovascular: Negative for chest pain.  Gastrointestinal: Negative for abdominal pain.  Musculoskeletal: Negative for falls.  Skin: Positive for rash.  Psychiatric/Behavioral: Negative for memory loss.   Past Medical History:  Diagnosis Date  . Allergic rhinitis   . Arthritis   . Basal cell carcinoma 10/03/2019   Right prox. pretibial below knee. Nodular pattern. Tx: EDC  . CHF (congestive heart failure) ([place]) 04/2016   developed 2 weeks after identifying AF  . Complication of anesthesia   . Dysrhythmia 04/2016   a fib    . GERD (gastroesophageal reflux disease)    occasionally has reflux/heartburn  . History of pelvic mass    s/p resection, benign  . Hx of dysplastic nevus 06/13/2014   Right distal dorsum forearm near wrist. Moderate atypia, extends to peripheral margin  . Hx of dysplastic nevus 02/01/2019   Right top of deltoid. Moderate atypia, limited margins free  . Hx of dysplastic nevus 02/01/2019   Left mid to upper back 5.0cm lateral to spine. Moderate atypia, close to margin  . Hypercholesteremia   . Hypertension   . Mitral regurgitation    noted echo 04/2016   . Osteoporosis    per pt report on Medical Screening Form   . PONV (postoperative nausea and vomiting)   . Pyelonephritis    remote hx   . Shortness of breath dyspnea   . Varicella zoster 2007   Past Surgical History:  Procedure Laterality Date  . [place]   lower back  . bladder prolapse repair    . BREAST CYST EXCISION Left    1990s  . CATARACT EXTRACTION Bilateral 2014   Dr. Sandra Cockayne  . ELECTROPHYSIOLOGIC STUDY N/A 06/15/2016   Procedure: CARDIOVERSION;  Surgeon: [name], MD;  Location: [facility] ORS;  Service: Cardiovascular;  Laterality: N/A;  . EYE SURGERY     bilateral cataract  . FOOT SURGERY    . HERNIA REPAIR  2013   inguinal  . JOINT REPLACEMENT  [id number]   bilateral knee replacements  . KNEE ARTHROSCOPY     right  . RECTOCELE REPAIR    . REVERSE SHOULDER ARTHROPLASTY Right 06/27/2018   Procedure: REVERSE SHOULDER ARTHROPLASTY;  Surgeon: Corky Mull, MD;  Location: [facility] ORS;  Service: Orthopedics;  Laterality: Right;  . TOTAL KNEE ARTHROPLASTY     left  . VAGINAL HYSTERECTOMY  1970   benign reasons   Family History  Problem Relation Age of Onset  . Emphysema Mother   . Heart disease Father   .  Early death Father        died 75 y.o MI   . Breast cancer Maternal Aunt 80   Social History   Socioeconomic History  . Marital status: Widowed    Spouse name: Not on file  . Number of children: Not on file  . Years of education: Not on file  . Highest education level: Not on file  Occupational History  . Not on file  Tobacco Use  . Smoking status: Never Smoker  . Smokeless tobacco: Never Used  Substance and Sexual Activity  . Alcohol use: Yes    Alcohol/week: 2.0 standard drinks    Types: 2 Glasses of wine per week    Comment: occassional  . Drug use: No  . Sexual activity: Never  Other Topics Concern  . Not on file  Social History Narrative   Widowed.   Very active within her community.   Enjoys singing, being active in her church, spending time  with family.   Lives with son Catalina [place]   1 son in [place]   2 sons in [place] Determinants of Health   Financial Resource Strain:   . Difficulty of Paying Living Expenses:   Food Insecurity:   . Worried About Charity fundraiser in the Last Year:   . Arboriculturist in the Last Year:   Transportation Needs:   . Film/video editor (Medical):   Marland Kitchen Lack of Transportation (Non-Medical):   Physical Activity:   . Days of Exercise per Week:   . Minutes of Exercise per Session:   Stress: No Stress Concern Present  . Feeling of Stress : Not at all  Social Connections:   . Frequency of Communication with Friends and Family:   . Frequency of Social Gatherings with Friends and Family:   . Attends Religious Services:   . Active Member of Clubs or Organizations:   . Attends Archivist Meetings:   Marland Kitchen Marital Status:   Intimate Partner Violence:   . Fear of Current or Ex-Partner:   . Emotionally Abused:   Marland Kitchen Physically Abused:   . Sexually Abused:    Current Meds  Medication Sig  . acetaminophen (TYLENOL) 500 MG tablet Take 500-1,000 mg by mouth 2 (two) times daily as needed for moderate pain or headache.  . Cholecalciferol (VITAMIN D3) 2000 units capsule Take 1,000 Units by mouth daily.   . diclofenac sodium (VOLTAREN) 1 % GEL Apply 1 application topically as needed (foot pain).  Marland Kitchen diltiazem (TIAZAC) 120 MG 24 hr capsule Take 120 mg by mouth once daily at night  . docusate calcium (SURFAK) 240 MG capsule Take 240 mg by mouth at bedtime.   . flecainide (TAMBOCOR) 50 MG tablet Take 1 tablet (50 mg total) by mouth 2 (two) times daily.  . furosemide (LASIX) 40 MG tablet Take 40 mg by mouth daily.   . hydrALAZINE (APRESOLINE) 25 MG tablet Take 1 tablet (25 mg total) by mouth 2 (two) times daily. (Patient taking differently: Take 25 mg by mouth 3 (three) times daily. )  . losartan (COZAAR) 100 MG tablet Take 1 tablet (100 mg total) by mouth daily.  . pravastatin (PRAVACHOL) 20  MG tablet Take 1 tablet (20 mg total) by mouth every evening.  . rivaroxaban (XARELTO) 20 MG TABS tablet Take 1 tablet (20 mg total) by mouth daily with supper.  . timolol (TIMOPTIC) 0.5 % ophthalmic solution   . [DISCONTINUED] potassium chloride (K-DUR,KLOR-CON) 20 MEQ tablet  Take 1 tablet (20 mEq total) by mouth 2 (two) times daily.   Allergies  Allergen Reactions  . Amlodipine Swelling    Ankle swelling  . Azo Urinary Tract Support Other (See Comments)    Skin turned orange  . Erythromycin Nausea And Vomiting   Recent Results (from the past 2160 hour(s))  CBC with Differential/Platelet     Status: None   Collection Time: 03/20/20 10:42 AM  Result Value Ref Range   WBC 6.1 4.0 - 10.5 K/uL   RBC 4.55 3.87 - 5.11 Mil/uL   Hemoglobin 12.9 12.0 - 15.0 g/dL   HCT 38.6 36.0 - 46.0 %   MCV 84.7 78.0 - 100.0 fl   MCHC 33.4 30.0 - 36.0 g/dL   RDW 14.0 11.5 - 15.5 %   Platelets 253.0 150.0 - 400.0 K/uL   Neutrophils Relative % 64.5 43.0 - 77.0 %   Lymphocytes Relative 24.2 12.0 - 46.0 %   Monocytes Relative 8.5 3.0 - 12.0 %   Eosinophils Relative 2.2 0.0 - 5.0 %   Basophils Relative 0.6 0.0 - 3.0 %   Neutro Abs 3.9 1.4 - 7.7 K/uL   Lymphs Abs 1.5 0.7 - 4.0 K/uL   Monocytes Absolute 0.5 0.1 - 1.0 K/uL   Eosinophils Absolute 0.1 0.0 - 0.7 K/uL   Basophils Absolute 0.0 0.0 - 0.1 K/uL  Lipid panel     Status: Abnormal   Collection Time: 03/20/20 10:42 AM  Result Value Ref Range   Cholesterol 202 (H) 0 - 200 mg/dL    Comment: ATP III Classification       Desirable:  < 200 mg/dL               Borderline High:  200 - 239 mg/dL          High:  > = 240 mg/dL   Triglycerides 142.0 0.0 - 149.0 mg/dL    Comment: Normal:  <150 mg/dLBorderline High:  150 - 199 mg/dL   HDL 61.30 >39.00 mg/dL   VLDL 28.4 0.0 - 40.0 mg/dL   LDL Cholesterol 112 (H) 0 - 99 mg/dL   Total CHOL/HDL Ratio 3     Comment:                Men          Women1/2 Average Risk     3.4          3.3Average Risk          5.0           4.42X Average Risk          9.6          7.13X Average Risk          15.0          11.0                       NonHDL 140.52     Comment: NOTE:  Non-HDL goal should be 30 mg/dL higher than patient's LDL goal (i.e. LDL goal of < 70 mg/dL, would have non-HDL goal of < 100 mg/dL)  Comprehensive metabolic panel     Status: Abnormal   Collection Time: 03/20/20 10:42 AM  Result Value Ref Range   Sodium 141 135 - 145 mEq/L   Potassium 3.3 (L) 3.5 - 5.1 mEq/L   Chloride 102 96 - 112 mEq/L   CO2 32 19 - 32 mEq/L   Glucose, Bld  131 (H) 70 - 99 mg/dL   BUN 21 6 - 23 mg/dL   Creatinine, Ser 0.58 0.40 - 1.20 mg/dL   Total Bilirubin 0.5 0.2 - 1.2 mg/dL   Alkaline Phosphatase 52 39 - 117 U/L   AST 15 0 - 37 U/L   ALT 13 0 - 35 U/L   Total Protein 6.8 6.0 - 8.3 g/dL   Albumin 4.2 3.5 - 5.2 g/dL   GFR 98.84 >60.00 mL/min   Calcium 9.1 8.4 - 10.5 mg/dL   Objective  Body mass index is 36.09 kg/m. Wt Readings from Last 3 Encounters:  03/20/20 191 lb (86.6 kg)  11/20/19 188 lb (85.3 kg)  12/08/18 185 lb 3 oz (84 kg)   Temp Readings from Last 3 Encounters:  03/20/20 (!) 97.2 F (36.2 C) (Temporal)  08/14/19 97.8 F (36.6 C)  12/08/18 98.4 F (36.9 C) (Oral)   BP Readings from Last 3 Encounters:  03/20/20 124/70  11/28/19 140/84  05/08/19 140/80   Pulse Readings from Last 3 Encounters:  03/20/20 86  11/28/19 67  05/08/19 67    Physical Exam Vitals and nursing note reviewed.  Constitutional:      Appearance: Normal appearance. She is well-developed and well-groomed. She is obese.  HENT:     Head: Normocephalic and atraumatic.  Eyes:     Conjunctiva/sclera: Conjunctivae normal.     Pupils: Pupils are equal, round, and reactive to light.  Cardiovascular:     Rate and Rhythm: Normal rate and regular rhythm.     Heart sounds: Normal heart sounds.     Comments: In NSR today No Afib Pulmonary:     Effort: Pulmonary effort is normal.     Breath sounds: Normal breath sounds.    Skin:    General: Skin is warm and dry.       Neurological:     General: No focal deficit present.     Mental Status: She is alert and oriented to person, place, and time. Mental status is at baseline.     Gait: Gait normal.  Psychiatric:        Attention and Perception: Attention and perception normal.        Mood and Affect: Mood and affect normal.        Speech: Speech normal.        Behavior: Behavior normal. Behavior is cooperative.        Thought Content: Thought content normal.        Cognition and Memory: Cognition and memory normal.        Judgment: Judgment normal.     Assessment  Plan  Essential hypertension - Plan: Comprehensive metabolic panel, Lipid panel, CBC with Differential/Platelet, CBC with Differential/Platelet, Lipid panel, Comprehensive metabolic panel Cont meds   Hyperlipidemia, unspecified hyperlipidemia type - Plan: Lipid panel, Lipid panel On pravachol 20 mg qhs   Actinic keratosis F/u Dr. Raliegh Ip 04/02/20   SOB (shortness of breath) on exertion - Plan: DG Chest 2 View Consider f/u cards in future Dr. Raliegh Ip if sob with exertion/rest continues  Echo 12/2017 ef 45-50% mild valve d/o and mild LVH   Intertrigo  Clotrimazole/hc/gold bond talc free powder   HM UTD vaccines(I.e prevnar, Tdap had 06/27/12) -flu shot utd 10/05/19 rec shingrix vaccine pna 23 had 11/28/19 covid vx 2/2   Colonoscopy had 12/29/14colonic angioectasia/IH no f/u rec  mammo 08/23/19 negordered 2021 mammo  DEXA8/4/20 osteopenia vit d and calcium rec Out of pap windows/p hysterectomy then ovaries removed  in 2002  dermatology [facility] Dr. Raliegh Ip -saw 05/10/2019 DN, ISK right cheek x 2, AK right cheek x 1 f/u in 6 months -04/02/20 10:30 am CC noted   [facility]  Provider: Dr. Olivia Mackie McLean-Scocuzza-Internal Medicine

## 2020-03-20 NOTE — Patient Instructions (Addendum)
Gold bond talc free powder  Clotrimazole 1% 2x per day as needed or itching  Hydrocortisone 1% 2x per day   COVID-19 Vaccine Information can be found at: ShippingScam.co.uk For questions related to vaccine distribution or appointments, please email vaccine@[email or web address] or call [phone].   Intertrigo Intertrigo is skin irritation or inflammation (dermatitis) that occurs when folds of skin rub together. The irritation can cause a rash and make skin raw and itchy. This condition most commonly occurs in the skin folds of these areas:  Toes.  Armpits.  Groin.  Under the belly.  Under the breasts.  Buttocks. Intertrigo is not passed from person to person (is not contagious). What are the causes? This condition is caused by heat, moisture, rubbing (friction), and not enough air circulation. The condition can be made worse by:  Sweat.  Bacteria.  A fungus, such as yeast. What increases the risk? This condition is more likely to occur if you have moisture in your skin folds. You are more likely to develop this condition if you:  Have diabetes.  Are overweight.  Are not able to move around or are not active.  Live in a warm and moist climate.  Wear splints, braces, or other medical devices.  Are not able to control your bowels or bladder (have incontinence). What are the signs or symptoms? Symptoms of this condition include:  A pink or red skin rash in the skin fold or near the skin fold.  Raw or scaly skin.  Itchiness.  A burning feeling.  Bleeding.  Leaking fluid.  A bad smell. How is this diagnosed? This condition is diagnosed with a medical history and physical exam. You may also have a skin swab to test for bacteria or a fungus. How is this treated? This condition may be treated by:  Cleaning and drying your skin.  Taking an antibiotic medicine or using an antibiotic skin cream for a  bacterial infection.  Using an antifungal cream on your skin or taking pills for an infection that was caused by a fungus, such as yeast.  Using a steroid ointment to relieve itchiness and irritation.  Separating the skin fold with a clean cotton cloth to absorb moisture and allow air to flow into the area. Follow these instructions at home:  Keep the affected area clean and dry.  Do not scratch your skin.  Stay in a cool environment as much as possible. Use an air conditioner or fan, if available.  Apply over-the-counter and prescription medicines only as told by your health care provider.  If you were prescribed an antibiotic medicine, use it as told by your health care provider. Do not stop using the antibiotic even if your condition improves.  Keep all follow-up visits as told by your health care provider. This is important. How is this prevented?   Maintain a healthy weight.  Take care of your feet, especially if you have diabetes. Foot care includes: ? Wearing shoes that fit well. ? Keeping your feet dry. ? Wearing clean, breathable socks.  Protect the skin around your groin and buttocks, especially if you have incontinence. Skin protection includes: ? Following a regular cleaning routine. ? Using skin protectant creams, powders, or ointments. ? Changing protection pads frequently.  Do not wear tight clothes. Wear clothes that are loose, absorbent, and made of cotton.  Wear a bra that gives good support, if needed.  Shower and dry yourself well after activity or exercise. Use a hair dryer on a cool setting to  dry between skin folds, especially after you bathe.  If you have diabetes, keep your blood sugar under control. Contact a health care provider if:  Your symptoms do not improve with treatment.  Your symptoms get worse or they spread.  You notice increased redness and warmth.  You have a fever. Summary  Intertrigo is skin irritation or inflammation  (dermatitis) that occurs when folds of skin rub together.  This condition is caused by heat, moisture, rubbing (friction), and not enough air circulation.  This condition may be treated by cleaning and drying your skin and with medicines.  Apply over-the-counter and prescription medicines only as told by your health care provider.  Keep all follow-up visits as told by your health care provider. This is important. This information is not intended to replace advice given to you by your health care provider. Make sure you discuss any questions you have with your health care provider. Document Revised: 05/15/2018 Document Reviewed: 05/15/2018 Elsevier Patient Education  [place].    Seborrheic Keratosis A seborrheic keratosis is a common, noncancerous (benign) skin growth. These growths are velvety, waxy, rough, tan, brown, or black spots that appear on the skin. These skin growths can be flat or raised, and scaly. What are the causes? The cause of this condition is not known. What increases the risk? You are more likely to develop this condition if you:  Have a family history of seborrheic keratosis.  Are 50 or older.  Are pregnant.  Have had estrogen replacement therapy. What are the signs or symptoms? Symptoms of this condition include growths on the face, chest, shoulders, back, or other areas. These growths:  Are usually painless, but may become irritated and itchy.  Can be yellow, brown, black, or other colors.  Are slightly raised or have a flat surface.  Are sometimes rough or wart-like in texture.  Are often velvety or waxy on the surface.  Are round or oval-shaped.  Often occur in groups, but may occur as a single growth. How is this diagnosed? This condition is diagnosed with a medical history and physical exam.  A sample of the growth may be tested (skin biopsy).  You may need to see a skin specialist (dermatologist). How is this treated?  Treatment is not usually needed for this condition, unless the growths are irritated or bleed often.  You may also choose to have the growths removed if you do not like their appearance. ? Most commonly, these growths are treated with a procedure in which liquid nitrogen is applied to "freeze" off the growth (cryosurgery). ? They may also be burned off with electricity (electrocautery) or removed by scraping (curettage). Follow these instructions at home:  Watch your growth for any changes.  Keep all follow-up visits as told by your health care provider. This is important.  Do not scratch or pick at the growth or growths. This can cause them to become irritated or infected. Contact a health care provider if:  You suddenly have many new growths.  Your growth bleeds, itches, or hurts.  Your growth suddenly becomes larger or changes color. Summary  A seborrheic keratosis is a common, noncancerous (benign) skin growth.  Treatment is not usually needed for this condition, unless the growths are irritated or bleed often.  Watch your growth for any changes.  Contact a health care provider if you suddenly have many new growths or your growth suddenly becomes larger or changes color.  Keep all follow-up visits as told by your  health care provider. This is important. This information is not intended to replace advice given to you by your health care provider. Make sure you discuss any questions you have with your health care provider. Document Revised: 04/27/2018 Document Reviewed: 04/27/2018 Elsevier Patient Education  [place].

## 2020-03-21 ENCOUNTER — Other Ambulatory Visit: Payer: Self-pay | Admitting: Internal Medicine

## 2020-03-21 DIAGNOSIS — E876 Hypokalemia: Secondary | ICD-10-CM

## 2020-03-21 MED ORDER — POTASSIUM CHLORIDE CRYS ER 20 MEQ PO TBCR: 20.0000 meq | EXTENDED_RELEASE_TABLET | Freq: Two times a day (BID) | ORAL | 3 refills | Status: DC

## 2020-03-24 DIAGNOSIS — L304 Erythema intertrigo: Secondary | ICD-10-CM | POA: Insufficient documentation

## 2020-03-26 ENCOUNTER — Other Ambulatory Visit: Payer: Medicare Other

## 2020-03-26 ENCOUNTER — Ambulatory Visit (INDEPENDENT_AMBULATORY_CARE_PROVIDER_SITE_OTHER): Payer: Medicare Other

## 2020-03-26 ENCOUNTER — Other Ambulatory Visit: Payer: Self-pay

## 2020-03-26 DIAGNOSIS — R0602 Shortness of breath: Secondary | ICD-10-CM | POA: Diagnosis not present

## 2020-03-27 ENCOUNTER — Telehealth: Payer: Self-pay | Admitting: Internal Medicine

## 2020-03-27 NOTE — Telephone Encounter (Signed)
With patient's potassium results it states for her to take 2 pills daily. Pt states she takes this already and would like to know if this needs to be increased?  Pt would like her Pravachol dose that was increased to 40 mg sent in but she has enough to take the 20 mg twice.    Please advise

## 2020-03-31 ENCOUNTER — Other Ambulatory Visit: Payer: Self-pay | Admitting: Internal Medicine

## 2020-03-31 DIAGNOSIS — E876 Hypokalemia: Secondary | ICD-10-CM

## 2020-03-31 DIAGNOSIS — E785 Hyperlipidemia, unspecified: Secondary | ICD-10-CM

## 2020-03-31 MED ORDER — POTASSIUM CHLORIDE CRYS ER 20 MEQ PO TBCR: 20.0000 meq | EXTENDED_RELEASE_TABLET | Freq: Three times a day (TID) | ORAL | 3 refills | Status: DC

## 2020-03-31 MED ORDER — PRAVASTATIN SODIUM 40 MG PO TABS: 40.0000 mg | ORAL_TABLET | Freq: Every evening | ORAL | 3 refills | Status: DC

## 2020-03-31 NOTE — Telephone Encounter (Signed)
Sent increased pravahol dose to 40 mg at night [facility]  Potassium 20 meq will increase to 3x per day from 2x per day  Inform pt  [place]

## 2020-04-01 NOTE — Telephone Encounter (Signed)
Patient informed and verbalized understanding

## 2020-04-01 NOTE — Telephone Encounter (Signed)
Left message to return call 

## 2020-04-02 ENCOUNTER — Other Ambulatory Visit: Payer: Self-pay

## 2020-04-02 ENCOUNTER — Ambulatory Visit (INDEPENDENT_AMBULATORY_CARE_PROVIDER_SITE_OTHER): Payer: Medicare Other | Admitting: Dermatology

## 2020-04-02 DIAGNOSIS — D485 Neoplasm of uncertain behavior of skin: Secondary | ICD-10-CM

## 2020-04-02 DIAGNOSIS — L570 Actinic keratosis: Secondary | ICD-10-CM

## 2020-04-02 DIAGNOSIS — C4492 Squamous cell carcinoma of skin, unspecified: Secondary | ICD-10-CM

## 2020-04-02 DIAGNOSIS — Z85828 Personal history of other malignant neoplasm of skin: Secondary | ICD-10-CM | POA: Diagnosis not present

## 2020-04-02 DIAGNOSIS — L820 Inflamed seborrheic keratosis: Secondary | ICD-10-CM | POA: Diagnosis not present

## 2020-04-02 DIAGNOSIS — D0462 Carcinoma in situ of skin of left upper limb, including shoulder: Secondary | ICD-10-CM | POA: Diagnosis not present

## 2020-04-02 DIAGNOSIS — D492 Neoplasm of unspecified behavior of bone, soft tissue, and skin: Secondary | ICD-10-CM

## 2020-04-02 DIAGNOSIS — L578 Other skin changes due to chronic exposure to nonionizing radiation: Secondary | ICD-10-CM | POA: Diagnosis not present

## 2020-04-02 HISTORY — DX: Squamous cell carcinoma of skin, unspecified: C44.92

## 2020-04-02 NOTE — Patient Instructions (Signed)

## 2020-04-02 NOTE — Progress Notes (Signed)
Follow-Up Visit   Subjective  Tina Howard is a 86 y.o. female who presents for the following: Skin Cancer (hx of BCC R pretibial ) and Skin Problem (scaly patches L hand, L leg).    The following portions of the chart were reviewed this encounter and updated as appropriate: Tobacco  Allergies  Meds  Problems  Med Hx  Surg Hx  Fam Hx      Review of Systems: No other skin or systemic complaints.  Objective  Well appearing patient in no apparent distress; mood and affect are within normal limits.  A focused examination was performed including legs,arms . Relevant physical exam findings are noted in the Assessment and Plan.  Objective  R proximal pretibial below the knee: Well healed scar with no evidence of recurrence.   Objective  L dorsum lat wrist: 1.0 cm pink patch  Objective  L infranasal: 0.4 cm pink patch   Assessment & Plan   Actinic Damage - diffuse scaly erythematous macules with underlying dyspigmentation - Recommend daily broad spectrum sunscreen SPF 30+ to sun-exposed areas, reapply every 2 hours as needed.  - Call for new or changing lesions.  AK (actinic keratosis) (2) R forehead, L upper lip  Destruction of lesion - R forehead, L upper lip Complexity: simple   Destruction method: cryotherapy   Informed consent: discussed and consent obtained   Timeout:  patient name, date of birth, surgical site, and procedure verified Lesion destroyed using liquid nitrogen: Yes   Region frozen until ice ball extended beyond lesion: Yes   Outcome: patient tolerated procedure well with no complications   Post-procedure details: wound care instructions given    History of basal cell carcinoma (BCC) R proximal pretibial below the knee  Inflamed seborrheic keratosis (2) Left mid pretibial  Recheck in 2 months if not gone we will consider biopsy  Destruction of lesion - Left mid pretibial  Destruction method: cryotherapy   Informed consent: discussed and  consent obtained   Lesion destroyed using liquid nitrogen: Yes   Region frozen until ice ball extended beyond lesion: Yes   Outcome: patient tolerated procedure well with no complications   Post-procedure details: wound care instructions given    Neoplasm of skin (2) L dorsum lat wrist  Epidermal / dermal shaving  Lesion length (cm):  1 Lesion width (cm):  1 Margin per side (cm):  0.2 Total excision diameter (cm):  1.4 Informed consent: discussed and consent obtained   Timeout: patient name, date of birth, surgical site, and procedure verified   Procedure prep:  Patient was prepped and draped in usual sterile fashion Prep type:  Isopropyl alcohol Anesthesia: the lesion was anesthetized in a standard fashion   Anesthetic:  1% lidocaine w/ epinephrine 1-100,000 buffered w/ 8.4% NaHCO3 Hemostasis achieved with: pressure, aluminum chloride and electrodesiccation   Outcome: patient tolerated procedure well   Post-procedure details: sterile dressing applied and wound care instructions given   Dressing type: bandage and petrolatum    Destruction of lesion Complexity: extensive   Destruction method: electrodesiccation and curettage   Informed consent: discussed and consent obtained   Timeout:  patient name, date of birth, surgical site, and procedure verified Procedure prep:  Patient was prepped and draped in usual sterile fashion Prep type:  Isopropyl alcohol Anesthesia: the lesion was anesthetized in a standard fashion   Anesthetic:  1% lidocaine w/ epinephrine 1-100,000 buffered w/ 8.4% NaHCO3 Curettage performed in three different directions: Yes   Electrodesiccation performed over the  curetted area: Yes   Lesion length (cm):  1 Lesion width (cm):  1 Margin per side (cm):  0.2 Final wound size (cm):  1.4 Hemostasis achieved with:  pressure, aluminum chloride and electrodesiccation Outcome: patient tolerated procedure well with no complications   Post-procedure details: sterile  dressing applied and wound care instructions given   Dressing type: bandage and petrolatum    Specimen 1 - Surgical pathology Differential Diagnosis: R/O BCC Check Margins: No 1.0 cm pink patch EDC today   L infranasal  Return to the office in 2 weeks for biopsy L infranasal   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  IMarye Round, CMA, am acting as scribe for Sarina Ser, MD .

## 2020-04-03 ENCOUNTER — Encounter: Payer: Self-pay | Admitting: Dermatology

## 2020-04-07 ENCOUNTER — Telehealth: Payer: Self-pay

## 2020-04-07 NOTE — Telephone Encounter (Signed)
Left message on voicemail please call here

## 2020-04-08 DIAGNOSIS — H35351 Cystoid macular degeneration, right eye: Secondary | ICD-10-CM | POA: Diagnosis not present

## 2020-04-08 NOTE — Telephone Encounter (Signed)
Patient returned Niki's phone call this morning. Returned call this afternoon and left patient message to return my call.

## 2020-04-09 NOTE — Telephone Encounter (Signed)
Patient has been advised of BX results per Dr. Nehemiah Massed. Patient will be back for follow up on 04/21/20.

## 2020-04-21 ENCOUNTER — Other Ambulatory Visit: Payer: Self-pay

## 2020-04-21 ENCOUNTER — Ambulatory Visit (INDEPENDENT_AMBULATORY_CARE_PROVIDER_SITE_OTHER): Payer: Medicare Other | Admitting: Dermatology

## 2020-04-21 ENCOUNTER — Encounter: Payer: Self-pay | Admitting: Dermatology

## 2020-04-21 DIAGNOSIS — C44310 Basal cell carcinoma of skin of unspecified parts of face: Secondary | ICD-10-CM

## 2020-04-21 DIAGNOSIS — S61212A Laceration without foreign body of right middle finger without damage to nail, initial encounter: Secondary | ICD-10-CM

## 2020-04-21 DIAGNOSIS — L570 Actinic keratosis: Secondary | ICD-10-CM

## 2020-04-21 DIAGNOSIS — C44311 Basal cell carcinoma of skin of nose: Secondary | ICD-10-CM | POA: Diagnosis not present

## 2020-04-21 DIAGNOSIS — C44319 Basal cell carcinoma of skin of other parts of face: Secondary | ICD-10-CM | POA: Diagnosis not present

## 2020-04-21 DIAGNOSIS — Z86007 Personal history of in-situ neoplasm of skin: Secondary | ICD-10-CM

## 2020-04-21 DIAGNOSIS — D485 Neoplasm of uncertain behavior of skin: Secondary | ICD-10-CM

## 2020-04-21 DIAGNOSIS — C4441 Basal cell carcinoma of skin of scalp and neck: Secondary | ICD-10-CM

## 2020-04-21 DIAGNOSIS — L578 Other skin changes due to chronic exposure to nonionizing radiation: Secondary | ICD-10-CM | POA: Diagnosis not present

## 2020-04-21 MED ORDER — MUPIROCIN 2 % EX OINT: 1.0000 "application " | TOPICAL_OINTMENT | Freq: Every day | CUTANEOUS | 0 refills | Status: DC

## 2020-04-21 NOTE — Patient Instructions (Signed)

## 2020-04-21 NOTE — Progress Notes (Addendum)
Follow-Up Visit   Subjective  Tina Howard is a 86 y.o. female who presents for the following: Follow-up (Pink patch of left infranasal. Will biopsy today if persistent.) and Other (Cut of right middle finger x 2 days.).    The following portions of the chart were reviewed this encounter and updated as appropriate:  Tobacco  Allergies  Meds  Problems  Med Hx  Surg Hx  Fam Hx      Review of Systems:  No other skin or systemic complaints except as noted in HPI or Assessment and Plan.  Objective  Well appearing patient in no apparent distress; mood and affect are within normal limits.  A focused examination was performed including face. Relevant physical exam findings are noted in the Assessment and Plan.  Objective  Right nose: Erythematous thin papules/macules with gritty scale.   Objective  Left dorsum lat wrist: Well healed scar with no evidence of recurrence.   Objective  Rigth middle finger: Laceration x 2 days.  Objective  Right prox mandible inf to ear: 0.6 cm crusted papule  Objective  Left infranasal: 0.5 cm pink patch   Assessment & Plan     AK (actinic keratosis) Right nose  Destruction of lesion - Right nose Complexity: simple   Destruction method: cryotherapy   Informed consent: discussed and consent obtained   Timeout:  patient name, date of birth, surgical site, and procedure verified Lesion destroyed using liquid nitrogen: Yes   Region frozen until ice ball extended beyond lesion: Yes   Outcome: patient tolerated procedure well with no complications   Post-procedure details: wound care instructions given    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 Observe   Ordered Medications: mupirocin ointment (BACTROBAN) 2 %  Laceration of right middle finger without foreign body without damage to nail, initial encounter (date of injury 04/19/20) Rigth middle finger  mupirocin  ointment (BACTROBAN) 2 % - Rigth middle finger  Neoplasm of uncertain behavior of skin (2) Right prox mandible inf to ear  Epidermal / dermal shaving  Lesion length (cm):  0.6 Lesion width (cm):  0.6 Margin per side (cm):  0.2 Total excision diameter (cm):  1 Informed consent: discussed and consent obtained   Timeout: patient name, date of birth, surgical site, and procedure verified   Procedure prep:  Patient was prepped and draped in usual sterile fashion Prep type:  Isopropyl alcohol Anesthesia: the lesion was anesthetized in a standard fashion   Anesthetic:  1% lidocaine w/ epinephrine 1-100,000 buffered w/ 8.4% NaHCO3 Instrument used: flexible razor blade   Hemostasis achieved with: pressure, aluminum chloride and electrodesiccation   Outcome: patient tolerated procedure well   Post-procedure details: sterile dressing applied and wound care instructions given   Dressing type: bandage and petrolatum    Destruction of lesion Complexity: extensive   Destruction method: electrodesiccation and curettage   Informed consent: discussed and consent obtained   Timeout:  patient name, date of birth, surgical site, and procedure verified Procedure prep:  Patient was prepped and draped in usual sterile fashion Prep type:  Isopropyl alcohol Anesthesia: the lesion was anesthetized in a standard fashion   Anesthetic:  1% lidocaine w/ epinephrine 1-100,000 buffered w/ 8.4% NaHCO3 Curettage performed in three different directions: Yes   Electrodesiccation performed over the curetted area: Yes   Hemostasis achieved with:  pressure, aluminum chloride and electrodesiccation Outcome: patient tolerated procedure well with no complications   Post-procedure  details: sterile dressing applied and wound care instructions given   Dressing type: bandage and petrolatum    Specimen 1 - Surgical pathology Differential Diagnosis: BCC vs other  Check Margins: No 0.6 cm crusted papule  Left  infranasal  Epidermal / dermal shaving; followed by DESTRUCTION with Electrodessication and Curettage - defect 0.9 cm  Lesion length (cm):  0.5 Lesion width (cm):  0.5 Margin per side (cm):  0.2 Total excision diameter (cm):  0.9 Informed consent: discussed and consent obtained   Timeout: patient name, date of birth, surgical site, and procedure verified   Procedure prep:  Patient was prepped and draped in usual sterile fashion Prep type:  Isopropyl alcohol Anesthesia: the lesion was anesthetized in a standard fashion   Anesthetic:  1% lidocaine w/ epinephrine 1-100,000 buffered w/ 8.4% NaHCO3 Instrument used: flexible razor blade   Hemostasis achieved with: pressure, aluminum chloride and electrodesiccation   Outcome: patient tolerated procedure well   Post-procedure details: sterile dressing applied and wound care instructions given   Dressing type: bandage and petrolatum    Specimen 2 - Surgical pathology Differential Diagnosis: BCC vs other  Check Margins: No 0.5 cm pink patch  Ordered Medications: mupirocin ointment (BACTROBAN) 2 %  Actinic Damage - diffuse scaly erythematous macules with underlying dyspigmentation - Recommend daily broad spectrum sunscreen SPF 30+ to sun-exposed areas, reapply every 2 hours as needed.  - Call for new or changing lesions.   Return in about 3 months (around 07/21/2020).   I, Ashok Cordia, CMA, am acting as scribe for Sarina Ser, MD .     Documentation: I have reviewed the above documentation for accuracy and completeness, and I agree with the above.  Sarina Ser, MD

## 2020-04-24 ENCOUNTER — Telehealth: Payer: Self-pay

## 2020-04-24 NOTE — Telephone Encounter (Signed)
Advised pt of bx results and advised to keep f/u appt./sh ?

## 2020-04-24 NOTE — Telephone Encounter (Signed)
-----   Message from Ralene Bathe, MD sent at 04/23/2020  1:29 PM EDT ----- 1. Skin , right prox mandible inf to ear BASAL CELL CARCINOMA, NODULAR PATTERN 2. Skin , left infranasal BASAL CELL CARCINOMA, NODULAR PATTERN  1&2 - both Cancer - BCC Both already treated Recheck next visit - keep appt

## 2020-05-12 ENCOUNTER — Other Ambulatory Visit: Payer: Self-pay

## 2020-05-12 DIAGNOSIS — E876 Hypokalemia: Secondary | ICD-10-CM

## 2020-05-12 MED ORDER — POTASSIUM CHLORIDE CRYS ER 20 MEQ PO TBCR: 20.0000 meq | EXTENDED_RELEASE_TABLET | Freq: Three times a day (TID) | ORAL | 3 refills | Status: DC

## 2020-05-13 ENCOUNTER — Encounter: Payer: Self-pay | Admitting: Podiatry

## 2020-05-13 ENCOUNTER — Ambulatory Visit (INDEPENDENT_AMBULATORY_CARE_PROVIDER_SITE_OTHER): Payer: Medicare Other | Admitting: Podiatry

## 2020-05-13 ENCOUNTER — Other Ambulatory Visit: Payer: Self-pay

## 2020-05-13 DIAGNOSIS — L989 Disorder of the skin and subcutaneous tissue, unspecified: Secondary | ICD-10-CM

## 2020-05-13 DIAGNOSIS — M79676 Pain in unspecified toe(s): Secondary | ICD-10-CM | POA: Diagnosis not present

## 2020-05-13 DIAGNOSIS — B351 Tinea unguium: Secondary | ICD-10-CM

## 2020-05-13 DIAGNOSIS — M79609 Pain in unspecified limb: Secondary | ICD-10-CM

## 2020-05-15 NOTE — Progress Notes (Signed)
Subjective: Patient is a 86 y.o. female presenting to the office today for follow up evaluation of painful callus lesion(s) noted to the bilateral feet. Bearing weight increases the pain. She has not done anything at home for treatment.  She also complains of pain to the left 5th toe that began a few months ago. Walking and wearing shoes increases the pain. She has been applying Voltaren gel for treatment with some relief.  Patient also complains of elongated, thickened nails that cause pain while ambulating in shoes. She is unable to trim her own nails. Patient presents today for further treatment and evaluation.  Past Medical History:  Diagnosis Date  . Allergic rhinitis   . Arthritis   . Basal cell carcinoma 10/03/2019   Right prox. pretibial below knee. Nodular pattern. Tx: EDC  . CHF (congestive heart failure) ([place]) 04/2016   developed 2 weeks after identifying AF  . Complication of anesthesia   . Dysrhythmia 04/2016   a fib    . GERD (gastroesophageal reflux disease)    occasionally has reflux/heartburn  . History of pelvic mass    s/p resection, benign  . Hx of dysplastic nevus 06/13/2014   Right distal dorsum forearm near wrist. Moderate atypia, extends to peripheral margin  . Hx of dysplastic nevus 02/01/2019   Right top of deltoid. Moderate atypia, limited margins free  . Hx of dysplastic nevus 02/01/2019   Left mid to upper back 5.0cm lateral to spine. Moderate atypia, close to margin  . Hypercholesteremia   . Hypertension   . Mitral regurgitation    noted echo 04/2016   . Osteoporosis    per pt report on Medical Screening Form   . PONV (postoperative nausea and vomiting)   . Pyelonephritis    remote hx   . Shortness of breath dyspnea   . Varicella zoster 2007    Objective:  Physical Exam General: Alert and oriented x3 in no acute distress  Dermatology: Hyperkeratotic lesion(s) present on the bilateral feet. Pain on palpation with a central nucleated core  noted. Skin is warm, dry and supple bilateral lower extremities. Negative for open lesions or macerations. Nails are tender, long, thickened and dystrophic with subungual debris, consistent with onychomycosis, 1-5 bilateral. No signs of infection noted.  Vascular: Palpable pedal pulses bilaterally. No edema or erythema noted. Capillary refill within normal limits.  Neurological: Epicritic and protective threshold grossly intact bilaterally.   Musculoskeletal Exam: Pain on palpation at the keratotic lesion(s) noted as well as the 5th MPJ of the left foot. Range of motion within normal limits bilateral. Muscle strength 5/5 in all groups bilateral.  Assessment: 1. Onychodystrophic nails 1-5 bilateral with hyperkeratosis of nails.  2. Onychomycosis of nail due to dermatophyte bilateral 3. Pre-ulcerative callus lesions noted to the bilateral feet x 4   Plan of Care:  1. Patient evaluated. 2. Excisional debridement of keratoic lesion(s) using a chisel blade was performed without incident.  3. Dressed with light dressing. 4. Mechanical debridement of nails 1-5 bilaterally performed using a nail nipper. Filed with dremel without incident.  5. Patient is to return to the clinic in 3 months.   Edrick Kins, DPM [facility]  Dr. Edrick Kins, DPM    [address]  [place], [place] [address]                Office [phone]  Fax [phone]   

## 2020-05-21 ENCOUNTER — Encounter: Payer: Self-pay | Admitting: Internal Medicine

## 2020-05-21 ENCOUNTER — Other Ambulatory Visit: Payer: Self-pay | Admitting: Internal Medicine

## 2020-05-21 DIAGNOSIS — E876 Hypokalemia: Secondary | ICD-10-CM

## 2020-05-21 MED ORDER — POTASSIUM CHLORIDE CRYS ER 20 MEQ PO TBCR: 20.0000 meq | EXTENDED_RELEASE_TABLET | Freq: Three times a day (TID) | ORAL | 3 refills | Status: DC

## 2020-06-25 IMAGING — DX DG SHOULDER 2+V PORT*R*
2 series · 2 of 2 positions shown · non-contrast
Comparison: 06/19/2018 preoperative right shoulder MR.

CLINICAL DATA: 85-year-old female post right shoulder replacement.
Subsequent encounter.

EXAM:
PORTABLE RIGHT SHOULDER

[shoulder ap]
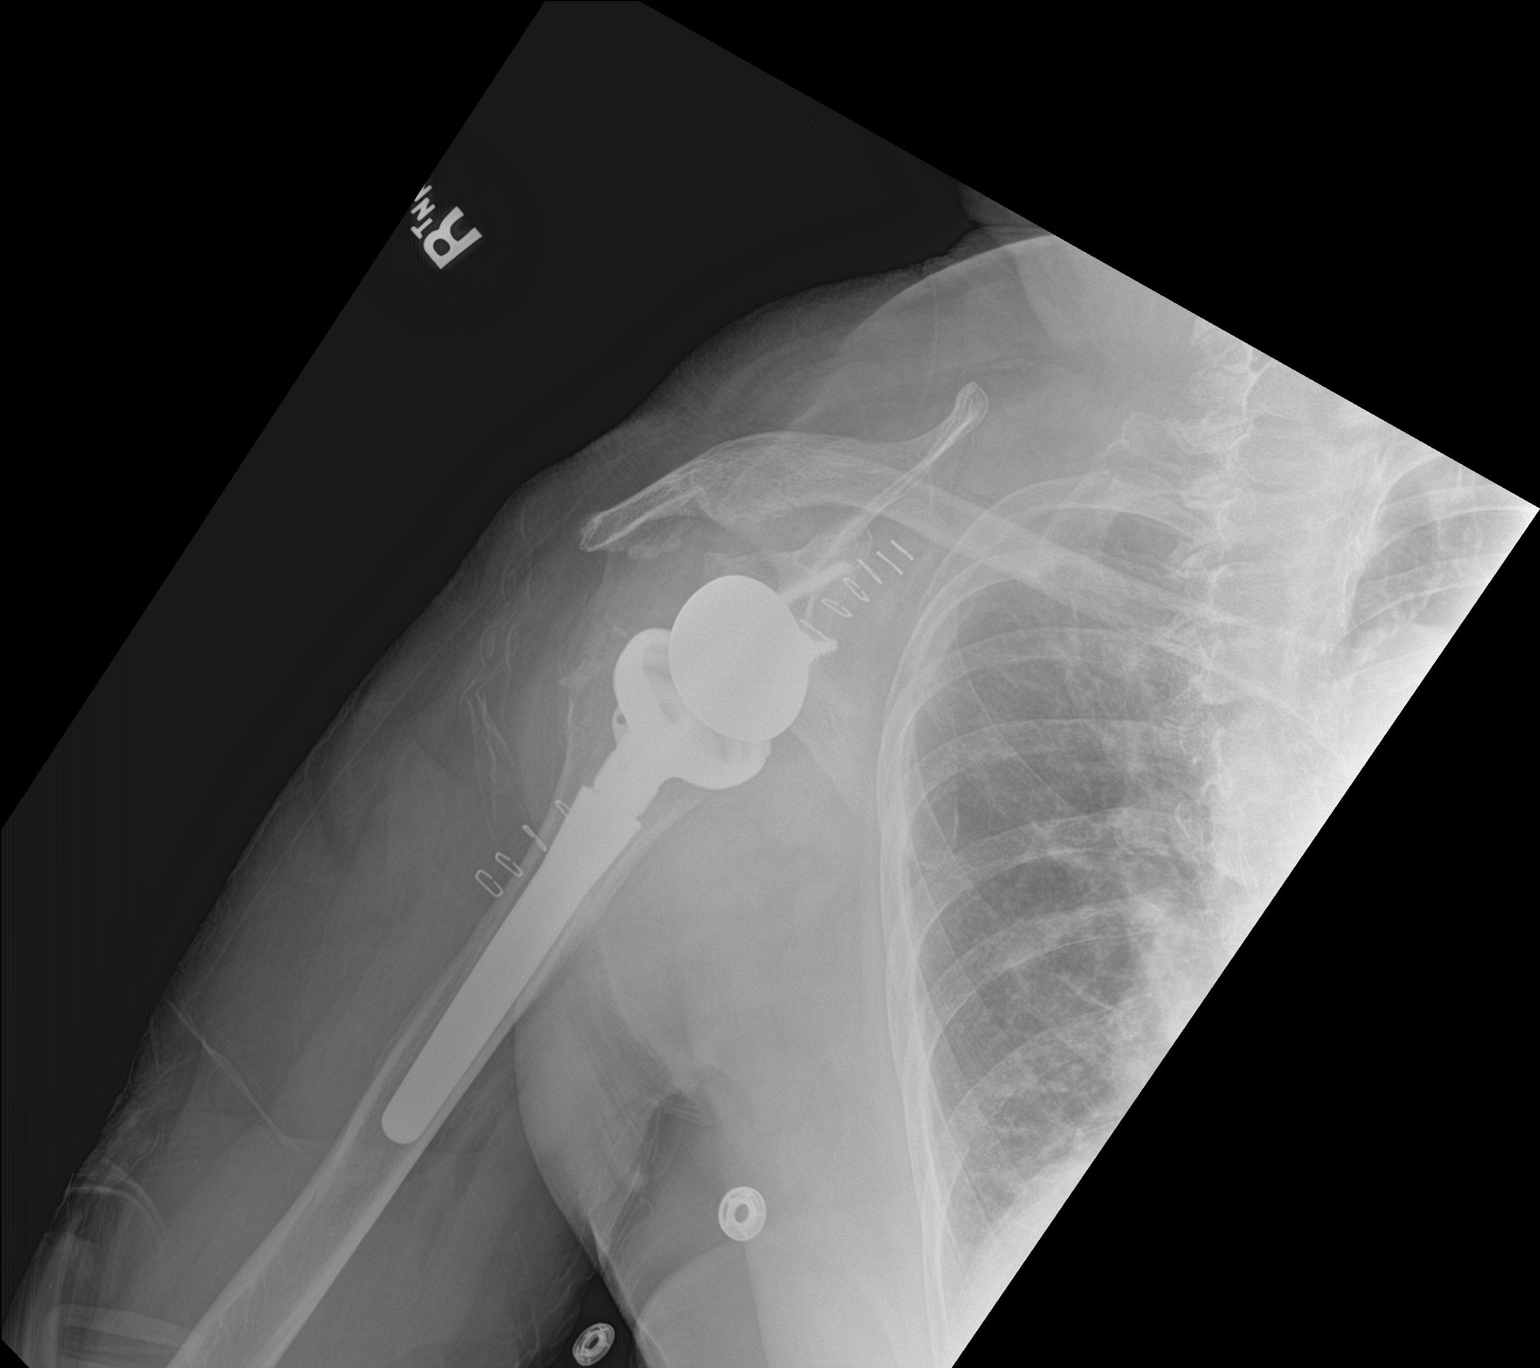

[shoulder obl]
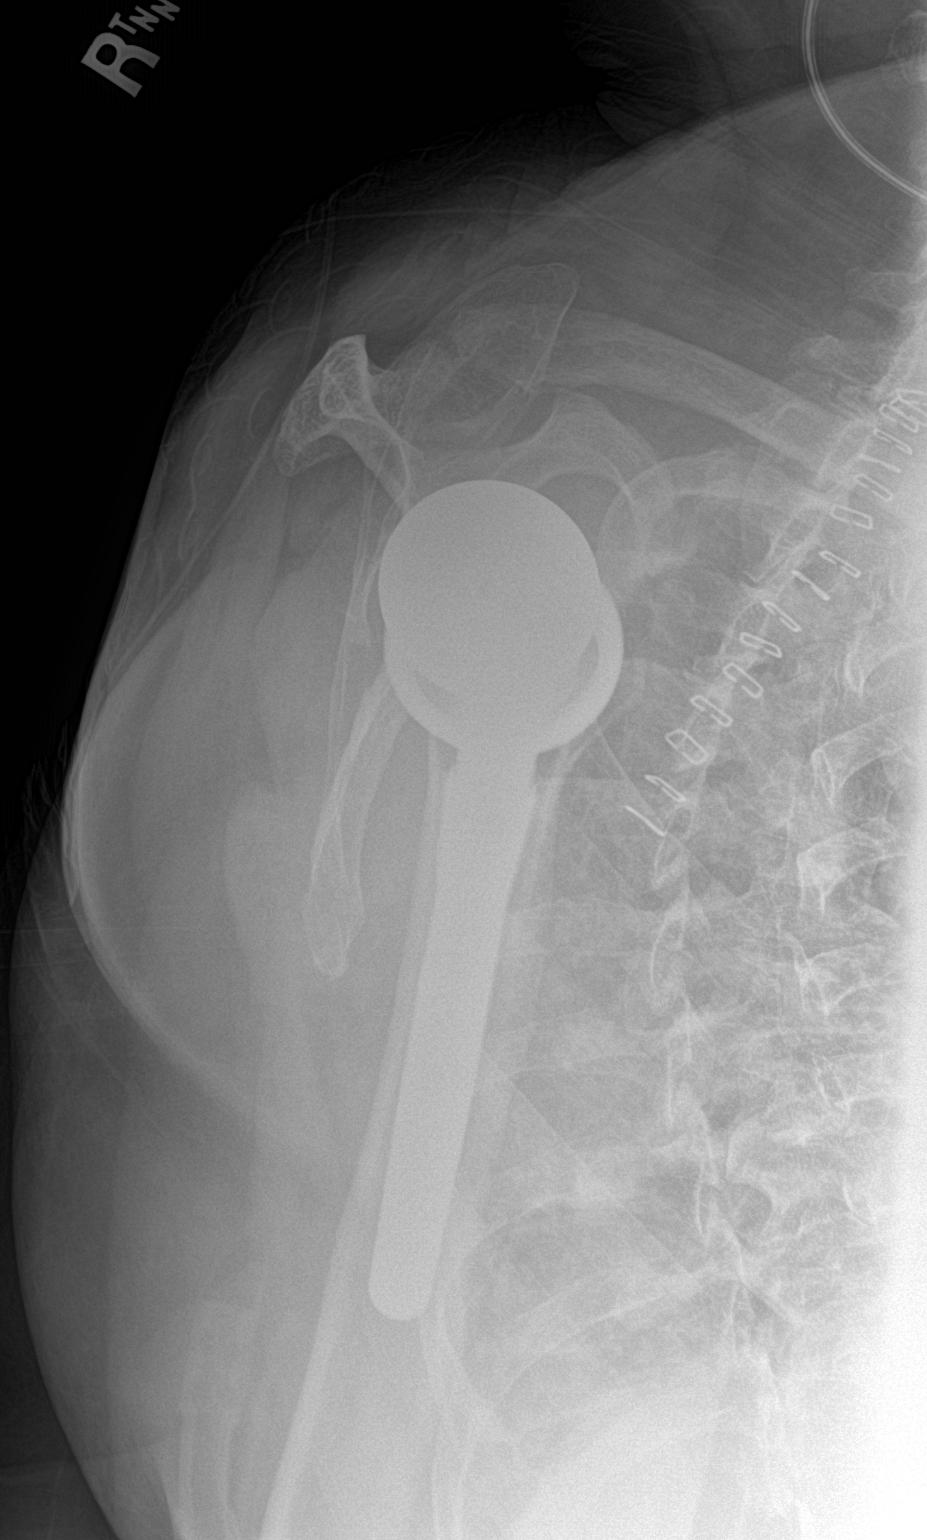

[2 of 2 positions shown; findings below may reference images not displayed]

FINDINGS: Post placement of reversed right shoulder arthroplasty which appears
in satisfactory position without complication noted.

Visualized lungs without acute abnormality.
IMPRESSION: Post right shoulder replacement.

## 2020-07-13 ENCOUNTER — Other Ambulatory Visit: Payer: Self-pay | Admitting: Internal Medicine

## 2020-07-13 MED ORDER — LOSARTAN POTASSIUM 100 MG PO TABS: 100.0000 mg | ORAL_TABLET | Freq: Every day | ORAL | 3 refills | Status: DC

## 2020-07-17 ENCOUNTER — Other Ambulatory Visit: Payer: Medicare Other

## 2020-07-17 ENCOUNTER — Telehealth: Payer: Self-pay | Admitting: Internal Medicine

## 2020-07-17 ENCOUNTER — Other Ambulatory Visit: Payer: Self-pay

## 2020-07-17 DIAGNOSIS — R399 Unspecified symptoms and signs involving the genitourinary system: Secondary | ICD-10-CM

## 2020-07-17 NOTE — Telephone Encounter (Signed)
Pt called in and stated that she would like to stop by today and pick up a bottle for urine sample because she thinks she may have something going on

## 2020-07-17 NOTE — Telephone Encounter (Signed)
Patient has walked in to the office. She has been placed on the lab schedule and orders placed.   Patient is agreeable to telephone consult fee.

## 2020-07-17 NOTE — Telephone Encounter (Signed)
Patient states she has been having some urinary frequency, dark urine, back pain. Last week she started having pressure at the end of urination. Patient states all symptoms are mild and she does not mind waiting on results.   States she has a history of back pain and current medications have her going to the bathroom frequently already.

## 2020-07-17 NOTE — Telephone Encounter (Signed)
Does patient need to be seen or consult fee?

## 2020-07-17 NOTE — Telephone Encounter (Signed)
Advise pt phone consult needed for any new symptom or appt Are symptoms so bad or can she wait for culture to result in 2 days?

## 2020-07-18 LAB — URINE CULTURE
MICRO NUMBER:: 10737304
SPECIMEN QUALITY:: ADEQUATE

## 2020-07-18 LAB — URINALYSIS
Bilirubin Urine: NEGATIVE
Glucose, UA: NEGATIVE
Hgb urine dipstick: NEGATIVE
Ketones, ur: NEGATIVE
Leukocytes,Ua: NEGATIVE
Nitrite: NEGATIVE
Protein, ur: NEGATIVE
Specific Gravity, Urine: 1.008 (ref 1.001–1.03)
pH: 7 (ref 5.0–8.0)

## 2020-07-20 NOTE — Telephone Encounter (Signed)
Urine no UTI  And normal urine all normal  Back pain could be due to arthritis  Increase water intake

## 2020-07-21 NOTE — Telephone Encounter (Signed)
Left message to return call 

## 2020-07-21 NOTE — Telephone Encounter (Signed)
Patient calling back in and informed.  Patient verbalized understanding  

## 2020-07-24 ENCOUNTER — Other Ambulatory Visit: Payer: Self-pay

## 2020-07-24 ENCOUNTER — Ambulatory Visit (INDEPENDENT_AMBULATORY_CARE_PROVIDER_SITE_OTHER): Payer: Medicare Other | Admitting: Dermatology

## 2020-07-24 DIAGNOSIS — L570 Actinic keratosis: Secondary | ICD-10-CM

## 2020-07-24 DIAGNOSIS — D23122 Other benign neoplasm of skin of left lower eyelid, including canthus: Secondary | ICD-10-CM | POA: Diagnosis not present

## 2020-07-24 DIAGNOSIS — D692 Other nonthrombocytopenic purpura: Secondary | ICD-10-CM | POA: Diagnosis not present

## 2020-07-24 DIAGNOSIS — Z85828 Personal history of other malignant neoplasm of skin: Secondary | ICD-10-CM

## 2020-07-24 DIAGNOSIS — L578 Other skin changes due to chronic exposure to nonionizing radiation: Secondary | ICD-10-CM

## 2020-07-24 DIAGNOSIS — L821 Other seborrheic keratosis: Secondary | ICD-10-CM | POA: Diagnosis not present

## 2020-07-24 DIAGNOSIS — D2310 Other benign neoplasm of skin of unspecified eyelid, including canthus: Secondary | ICD-10-CM

## 2020-07-24 NOTE — Patient Instructions (Signed)
Recommend daily broad spectrum sunscreen SPF 30+ to sun-exposed areas, reapply every 2 hours as needed. Call for new or changing lesions.  

## 2020-07-24 NOTE — Progress Notes (Signed)
   Follow-Up Visit   Subjective  Tina Howard is a 86 y.o. female who presents for the following: Follow-up ( OV 04/21/20 ).  Patient present today for follow up on OV 04/21/20 for AK on right nose, and bx proven BCC on Right prox mandible inf to ear and BCC on Left infranasal that were treated with ED&C at time of biopsy. Patient has a history of a hidrocystoma on left eyelid,she has discussed this with her eye doctor and his recommendation was excision, however she had  decided to leave alone at that time.   The following portions of the chart were reviewed this encounter and updated as appropriate:  Tobacco  Allergies  Meds  Problems  Med Hx  Surg Hx  Fam Hx      Review of Systems:  No other skin or systemic complaints except as noted in HPI or Assessment and Plan.  Objective  Well appearing patient in no apparent distress; mood and affect are within normal limits.  A focused examination was performed including Face. Relevant physical exam findings are noted in the Assessment and Plan.  Objective  Left Dorsal Hand: Violaceous macules and patches.   Objective  Right Upper  Lip, Right forehead: Erythematous thin papules/macules with gritty scale.   Objective  Left Infranasal, Right Mandible inferior to ear: Well healed scar with no evidence of recurrence.   Objective  Left Lower Eyelid : Fluid filled papule   Assessment & Plan    Senile purpura (HCC) Left Dorsal Hand  Benign, observe.    AK (actinic keratosis) (2) Right forehead; Right Upper  Lip  Cryotherapy today Prior to procedure, discussed risks of blister formation, small wound, skin dyspigmentation, or rare scar following cryotherapy.    Destruction of lesion - Right Upper  Lip, Right forehead Complexity: simple   Destruction method: cryotherapy   Informed consent: discussed and consent obtained   Timeout:  patient name, date of birth, surgical site, and procedure verified Lesion destroyed using  liquid nitrogen: Yes   Region frozen until ice ball extended beyond lesion: Yes   Outcome: patient tolerated procedure well with no complications   Post-procedure details: wound care instructions given    History of basal cell carcinoma (BCC) (2) Left Infranasal; Right Mandible inferior to ear  Clear. Observe for recurrence. Call clinic for new or changing lesions.  Recommend regular skin exams, daily broad-spectrum spf 30+ sunscreen use, and photoprotection.     Hidrocystoma of eyelid, left Left Lower Eyelid   We also discussed removal however because we feel it is benign we will observe for now and consider removal in the future if she desires or becomes symptomatic.  Benign, observe.     Actinic Damage - diffuse scaly erythematous macules with underlying dyspigmentation - Recommend daily broad spectrum sunscreen SPF 30+ to sun-exposed areas, reapply every 2 hours as needed.  - Call for new or changing lesions.  Seborrheic Keratoses - Stuck-on, waxy, tan-brown papules and plaques  - Discussed benign etiology and prognosis. - Observe - Call for any changes  Return for 3 to 6 months.  Marene Lenz, CMA, am acting as scribe for Sarina Ser, MD . Documentation: I have reviewed the above documentation for accuracy and completeness, and I agree with the above.  Sarina Ser, MD

## 2020-07-25 ENCOUNTER — Encounter: Payer: Self-pay | Admitting: Dermatology

## 2020-08-15 ENCOUNTER — Ambulatory Visit (INDEPENDENT_AMBULATORY_CARE_PROVIDER_SITE_OTHER): Payer: Medicare Other | Admitting: Podiatry

## 2020-08-15 ENCOUNTER — Other Ambulatory Visit: Payer: Self-pay

## 2020-08-15 DIAGNOSIS — M79676 Pain in unspecified toe(s): Secondary | ICD-10-CM | POA: Diagnosis not present

## 2020-08-15 DIAGNOSIS — L989 Disorder of the skin and subcutaneous tissue, unspecified: Secondary | ICD-10-CM

## 2020-08-15 DIAGNOSIS — B351 Tinea unguium: Secondary | ICD-10-CM

## 2020-08-15 NOTE — Progress Notes (Signed)
Subjective: Patient is a 86 y.o. female presenting to the office today for follow up evaluation of painful callus lesion(s) noted to the bilateral feet. Bearing weight increases the pain. She has not done anything at home for treatment.  Patient states that over the last 3 weeks she has been having some pain to the lateral aspect of the left foot due to the callus that is slowly recurring.  Walking and wearing shoes increases the pain. She has been applying Voltaren gel for treatment with some relief.  Patient also complains of elongated, thickened nails that cause pain while ambulating in shoes. She is unable to trim her own nails. Patient presents today for further treatment and evaluation.  Past Medical History:  Diagnosis Date  . Allergic rhinitis   . Arthritis   . Basal cell carcinoma 10/03/2019   Right prox. pretibial below knee. Nodular pattern. Tx: EDC  . Basal cell carcinoma 04/21/2020   Right prox. mandible inf. to ear. Nodular pattern.  . Basal cell carcinoma 04/21/2020   Left infranasal. Nodular pattern.  . CHF (congestive heart failure) ([place]) 04/2016   developed 2 weeks after identifying AF  . Complication of anesthesia   . Dysrhythmia 04/2016   a fib    . GERD (gastroesophageal reflux disease)    occasionally has reflux/heartburn  . History of pelvic mass    s/p resection, benign  . Hx of dysplastic nevus 06/13/2014   Right distal dorsum forearm near wrist. Moderate atypia, extends to peripheral margin  . Hx of dysplastic nevus 02/01/2019   Right top of deltoid. Moderate atypia, limited margins free  . Hx of dysplastic nevus 02/01/2019   Left mid to upper back 5.0cm lateral to spine. Moderate atypia, close to margin  . Hypercholesteremia   . Hypertension   . Mitral regurgitation    noted echo 04/2016   . Osteoporosis    per pt report on Medical Screening Form   . PONV (postoperative nausea and vomiting)   . Pyelonephritis    remote hx   . Shortness of breath  dyspnea   . Squamous cell carcinoma of skin 04/02/2020   Left dorsum lat. wrist. SCCis, hypertophic. EDC  . Varicella zoster 2007    Objective:  Physical Exam General: Alert and oriented x3 in no acute distress  Dermatology: Hyperkeratotic lesion(s) present on the bilateral feet. Pain on palpation with a central nucleated core noted. Skin is warm, dry and supple bilateral lower extremities. Negative for open lesions or macerations. Nails are tender, long, thickened and dystrophic with subungual debris, consistent with onychomycosis, 1-5 bilateral. No signs of infection noted.  Vascular: Palpable pedal pulses bilaterally. No edema or erythema noted. Capillary refill within normal limits.  Neurological: Epicritic and protective threshold grossly intact bilaterally.   Musculoskeletal Exam: Pain on palpation at the keratotic lesion(s) noted as well as the 5th MPJ of the left foot. Range of motion within normal limits bilateral. Muscle strength 5/5 in all groups bilateral.  Assessment: 1. Onychodystrophic nails 1-5 bilateral with hyperkeratosis of nails.  2. Onychomycosis of nail due to dermatophyte bilateral 3. Pre-ulcerative callus lesions noted to the bilateral feet x 4   Plan of Care:  1. Patient evaluated. 2. Excisional debridement of keratoic lesion(s) using a chisel blade was performed without incident.  3. Dressed with light dressing. 4. Mechanical debridement of nails 1-5 bilaterally performed using a nail nipper. Filed with dremel without incident.  5. Patient is to return to the clinic in 3 months.  Edrick Kins, DPM [facility]  Dr. Edrick Kins, [address]                                        [place], [place] [address]                Office [phone]  Fax [phone]

## 2020-08-20 DIAGNOSIS — M19012 Primary osteoarthritis, left shoulder: Secondary | ICD-10-CM | POA: Diagnosis not present

## 2020-08-21 DIAGNOSIS — I480 Paroxysmal atrial fibrillation: Secondary | ICD-10-CM | POA: Diagnosis not present

## 2020-08-21 DIAGNOSIS — E7849 Other hyperlipidemia: Secondary | ICD-10-CM | POA: Diagnosis not present

## 2020-08-21 DIAGNOSIS — I10 Essential (primary) hypertension: Secondary | ICD-10-CM | POA: Diagnosis not present

## 2020-08-25 ENCOUNTER — Ambulatory Visit
Admission: RE | Admit: 2020-08-25 | Discharge: 2020-08-25 | Disposition: A | Discharge status: Home / Self Care | Payer: Medicare Other | Source: Ambulatory Visit | Attending: Internal Medicine | Admitting: Internal Medicine

## 2020-08-25 ENCOUNTER — Other Ambulatory Visit: Payer: Self-pay

## 2020-08-25 DIAGNOSIS — Z1231 Encounter for screening mammogram for malignant neoplasm of breast: Secondary | ICD-10-CM | POA: Diagnosis not present

## 2020-09-23 ENCOUNTER — Encounter: Payer: Self-pay | Admitting: Internal Medicine

## 2020-09-23 ENCOUNTER — Ambulatory Visit (INDEPENDENT_AMBULATORY_CARE_PROVIDER_SITE_OTHER): Payer: Medicare Other | Admitting: Internal Medicine

## 2020-09-23 ENCOUNTER — Other Ambulatory Visit: Payer: Self-pay

## 2020-09-23 VITALS — BP 124/68 | HR 62 | Temp 97.9°F | Ht 61.0 in | Wt 184.6 lb

## 2020-09-23 DIAGNOSIS — E538 Deficiency of other specified B group vitamins: Secondary | ICD-10-CM

## 2020-09-23 DIAGNOSIS — R7303 Prediabetes: Secondary | ICD-10-CM | POA: Diagnosis not present

## 2020-09-23 DIAGNOSIS — I480 Paroxysmal atrial fibrillation: Secondary | ICD-10-CM | POA: Diagnosis not present

## 2020-09-23 DIAGNOSIS — E669 Obesity, unspecified: Secondary | ICD-10-CM

## 2020-09-23 DIAGNOSIS — I10 Essential (primary) hypertension: Secondary | ICD-10-CM

## 2020-09-23 DIAGNOSIS — Z23 Encounter for immunization: Secondary | ICD-10-CM

## 2020-09-23 DIAGNOSIS — Z1329 Encounter for screening for other suspected endocrine disorder: Secondary | ICD-10-CM | POA: Diagnosis not present

## 2020-09-23 DIAGNOSIS — R269 Unspecified abnormalities of gait and mobility: Secondary | ICD-10-CM

## 2020-09-23 DIAGNOSIS — Z974 Presence of external hearing-aid: Secondary | ICD-10-CM

## 2020-09-23 DIAGNOSIS — E782 Mixed hyperlipidemia: Secondary | ICD-10-CM | POA: Diagnosis not present

## 2020-09-23 LAB — CBC WITH DIFFERENTIAL/PLATELET
Basophils Absolute: 0 10*3/uL (ref 0.0–0.1)
Basophils Relative: 0.5 % (ref 0.0–3.0)
Eosinophils Absolute: 0.1 10*3/uL (ref 0.0–0.7)
Eosinophils Relative: 1.6 % (ref 0.0–5.0)
HCT: 38.6 % (ref 36.0–46.0)
Hemoglobin: 12.7 g/dL (ref 12.0–15.0)
Lymphocytes Relative: 20.7 % (ref 12.0–46.0)
Lymphs Abs: 1.4 10*3/uL (ref 0.7–4.0)
MCHC: 33 g/dL (ref 30.0–36.0)
MCV: 86.3 fl (ref 78.0–100.0)
Monocytes Absolute: 0.5 10*3/uL (ref 0.1–1.0)
Monocytes Relative: 7.7 % (ref 3.0–12.0)
Neutro Abs: 4.7 10*3/uL (ref 1.4–7.7)
Neutrophils Relative %: 69.5 % (ref 43.0–77.0)
Platelets: 243 10*3/uL (ref 150.0–400.0)
RBC: 4.47 Mil/uL (ref 3.87–5.11)
RDW: 14.3 % (ref 11.5–15.5)
WBC: 6.8 10*3/uL (ref 4.0–10.5)

## 2020-09-23 LAB — COMPREHENSIVE METABOLIC PANEL
ALT: 12 U/L (ref 0–35)
AST: 14 U/L (ref 0–37)
Albumin: 4.3 g/dL (ref 3.5–5.2)
Alkaline Phosphatase: 47 U/L (ref 39–117)
BUN: 17 mg/dL (ref 6–23)
CO2: 35 mEq/L — ABNORMAL HIGH (ref 19–32)
Calcium: 9 mg/dL (ref 8.4–10.5)
Chloride: 101 mEq/L (ref 96–112)
Creatinine, Ser: 0.55 mg/dL (ref 0.40–1.20)
GFR: 104.96 mL/min (ref >60.00)
Glucose, Bld: 112 mg/dL — ABNORMAL HIGH (ref 70–99)
Potassium: 3.3 mEq/L — ABNORMAL LOW (ref 3.5–5.1)
Sodium: 143 mEq/L (ref 135–145)
Total Bilirubin: 0.6 mg/dL (ref 0.2–1.2)
Total Protein: 6.5 g/dL (ref 6.0–8.3)

## 2020-09-23 LAB — VITAMIN B12: Vitamin B-12: 253 pg/mL (ref 211–911)

## 2020-09-23 LAB — LIPID PANEL
Cholesterol: 209 mg/dL — ABNORMAL HIGH (ref 0–200)
HDL: 76.5 mg/dL (ref >39.00)
LDL Cholesterol: 111 mg/dL — ABNORMAL HIGH (ref 0–99)
NonHDL: 132.57
Total CHOL/HDL Ratio: 3
Triglycerides: 110 mg/dL (ref 0.0–149.0)
VLDL: 22 mg/dL (ref 0.0–40.0)

## 2020-09-23 LAB — HEMOGLOBIN A1C: Hgb A1c MFr Bld: 6 % (ref 4.6–6.5)

## 2020-09-23 LAB — TSH: TSH: 1.51 u[IU]/mL (ref 0.35–4.50)

## 2020-09-23 NOTE — Patient Instructions (Addendum)
Back applicator   Try to drink 55 ounces daily     Fall Prevention in the Home, Adult Falls can cause injuries and can affect people from all age groups. There are many simple things that you can do to make your home safe and to help prevent falls. Ask for help when making these changes, if needed. What actions can I take to prevent falls? General instructions  Use good lighting in all rooms. Replace any light bulbs that burn out.  Turn on lights if it is dark. Use night-lights.  Place frequently used items in easy-to-reach places. Lower the shelves around your home if necessary.  Set up furniture so that there are clear paths around it. Avoid moving your furniture around.  Remove throw rugs and other tripping hazards from the floor.  Avoid walking on wet floors.  Fix any uneven floor surfaces.  Add color or contrast paint or tape to grab bars and handrails in your home. Place contrasting color strips on the first and last steps of stairways.  When you use a stepladder, make sure that it is completely opened and that the sides are firmly locked. Have someone hold the ladder while you are using it. Do not climb a closed stepladder.  Be aware of any and all pets. What can I do in the bathroom?      Keep the floor dry. Immediately clean up any water that spills onto the floor.  Remove soap buildup in the tub or shower on a regular basis.  Use non-skid mats or decals on the floor of the tub or shower.  Attach bath mats securely with double-sided, non-slip rug tape.  If you need to sit down while you are in the shower, use a plastic, non-slip stool.  Install grab bars by the toilet and in the tub and shower. Do not use towel bars as grab bars. What can I do in the bedroom?  Make sure that a bedside light is easy to reach.  Do not use oversized bedding that drapes onto the floor.  Have a firm chair that has side arms to use for getting dressed. What can I do in the  kitchen?  Clean up any spills right away.  If you need to reach for something above you, use a sturdy step stool that has a grab bar.  Keep electrical cables out of the way.  Do not use floor polish or wax that makes floors slippery. If you must use wax, make sure that it is non-skid floor wax. What can I do in the stairways?  Do not leave any items on the stairs.  Make sure that you have a light switch at the top of the stairs and the bottom of the stairs. Have them installed if you do not have them.  Make sure that there are handrails on both sides of the stairs. Fix handrails that are broken or loose. Make sure that handrails are as long as the stairways.  Install non-slip stair treads on all stairs in your home.  Avoid having throw rugs at the top or bottom of stairways, or secure the rugs with carpet tape to prevent them from moving.  Choose a carpet design that does not hide the edge of steps on the stairway.  Check any carpeting to make sure that it is firmly attached to the stairs. Fix any carpet that is loose or worn. What can I do on the outside of my home?  Use bright outdoor lighting.  Regularly repair the edges of walkways and driveways and fix any cracks.  Remove high doorway thresholds.  Trim any shrubbery on the main path into your home.  Regularly check that handrails are securely fastened and in good repair. Both sides of any steps should have handrails.  Install guardrails along the edges of any raised decks or porches.  Clear walkways of debris and clutter, including tools and rocks.  Have leaves, snow, and ice cleared regularly.  Use sand or salt on walkways during winter months.  In the garage, clean up any spills right away, including grease or oil spills. What other actions can I take?  Wear closed-toe shoes that fit well and support your feet. Wear shoes that have rubber soles or low heels.  Use mobility aids as needed, such as canes, walkers,  scooters, and crutches.  Review your medicines with your health care provider. Some medicines can cause dizziness or changes in blood pressure, which increase your risk of falling. Talk with your health care provider about other ways that you can decrease your risk of falls. This may include working with a physical therapist or trainer to improve your strength, balance, and endurance. Where to find more information  Centers for Disease Control and Prevention, [facility]: WebmailGuide.co.za  [employer] on Aging: BrainJudge.co.uk Contact a health care provider if:  You are afraid of falling at home.  You feel weak, drowsy, or dizzy at home.  You fall at home. Summary  There are many simple things that you can do to make your home safe and to help prevent falls.  Ways to make your home safe include removing tripping hazards and installing grab bars in the bathroom.  Ask for help when making these changes in your home. This information is not intended to replace advice given to you by your health care provider. Make sure you discuss any questions you have with your health care provider. Document Revised: 11/25/2017 Document Reviewed: 07/28/2017 Elsevier Patient Education  [place].    Atrial Fibrillation  Atrial fibrillation is a type of irregular or rapid heartbeat (arrhythmia). In atrial fibrillation, the top part of the heart (atria) beats in an irregular pattern. This makes the heart unable to pump blood normally and effectively. The goal of treatment is to prevent blood clots from forming, control your heart rate, or restore your heartbeat to a normal rhythm. If this condition is not treated, it can cause serious problems, such as a weakened heart muscle (cardiomyopathy) or a stroke. What are the causes? This condition is often caused by medical conditions that damage the heart's electrical system. These include:  High blood pressure (hypertension). This  is the most common cause.  Certain heart problems or conditions, such as heart failure, coronary artery disease, heart valve problems, or heart surgery.  Diabetes.  Overactive thyroid (hyperthyroidism).  Obesity.  Chronic kidney disease. In some cases, the cause of this condition is not known. What increases the risk? This condition is more likely to develop in:  Older people.  People who smoke.  Athletes who do endurance exercise.  People who have a family history of atrial fibrillation.  Men.  People who use drugs.  People who drink a lot of alcohol.  People who have lung conditions, such as emphysema, pneumonia, or COPD.  People who have obstructive sleep apnea. What are the signs or symptoms? Symptoms of this condition include:  A feeling that your heart is racing or beating irregularly.  Discomfort or pain in  your chest.  Shortness of breath.  Sudden light-headedness or weakness.  Tiring easily during exercise or activity.  Fatigue.  Syncope (fainting).  Sweating. In some cases, there are no symptoms. How is this diagnosed? Your health care provider may detect atrial fibrillation when taking your pulse. If detected, this condition may be diagnosed with:  An electrocardiogram (ECG) to check electrical signals of the heart.  An ambulatory cardiac monitor to record your heart's activity for a few days.  A transthoracic echocardiogram (TTE) to create pictures of your heart.  A transesophageal echocardiogram (TEE) to create even closer pictures of your heart.  A stress test to check your blood supply while you exercise.  Imaging tests, such as a CT scan or chest X-ray.  Blood tests. How is this treated? Treatment depends on underlying conditions and how you feel when you experience atrial fibrillation. This condition may be treated with:  Medicines to prevent blood clots or to treat heart rate or heart rhythm problems.  Electrical cardioversion  to reset the heart's rhythm.  A pacemaker to correct abnormal heart rhythm.  Ablation to remove the heart tissue that sends abnormal signals.  Left atrial appendage closure to seal the area where blood clots can form. In some cases, underlying conditions will be treated. Follow these instructions at home: Medicines  Take over-the counter and prescription medicines only as told by your health care provider.  Do not take any new medicines without talking to your health care provider.  If you are taking blood thinners: ? Talk with your health care provider before you take any medicines that contain aspirin or NSAIDs, such as ibuprofen. These medicines increase your risk for dangerous bleeding. ? Take your medicine exactly as told, at the same time every day. ? Avoid activities that could cause injury or bruising, and follow instructions about how to prevent falls. ? Wear a medical alert bracelet or carry a card that lists what medicines you take. Lifestyle      Do not use any products that contain nicotine or tobacco, such as cigarettes, e-cigarettes, and chewing tobacco. If you need help quitting, ask your health care provider.  Eat heart-healthy foods. Talk with a dietitian to make an eating plan that is right for you.  Exercise regularly as told by your health care provider.  Do not drink alcohol.  Lose weight if you are overweight.  Do not use drugs, including cannabis. General instructions  If you have obstructive sleep apnea, manage your condition as told by your health care provider.  Do not use diet pills unless your health care provider approves. Diet pills can make heart problems worse.  Keep all follow-up visits as told by your health care provider. This is important. Contact a health care provider if you:  Notice a change in the rate, rhythm, or strength of your heartbeat.  Are taking a blood thinner and you notice more bruising.  Tire more easily when you  exercise or do heavy work.  Have a sudden change in weight. Get help right away if you have:   Chest pain, abdominal pain, sweating, or weakness.  Trouble breathing.  Side effects of blood thinners, such as blood in your vomit, stool, or urine, or bleeding that cannot stop.  Any symptoms of a stroke. "BE FAST" is an easy way to remember the main warning signs of a stroke: ? B - Balance. Signs are dizziness, sudden trouble walking, or loss of balance. ? E - Eyes. Signs are trouble  seeing or a sudden change in vision. ? F - Face. Signs are sudden weakness or numbness of the face, or the face or eyelid drooping on one side. ? A - Arms. Signs are weakness or numbness in an arm. This happens suddenly and usually on one side of the body. ? S - Speech. Signs are sudden trouble speaking, slurred speech, or trouble understanding what people say. ? T - Time. Time to call emergency services. Write down what time symptoms started.  Other signs of a stroke, such as: ? A sudden, severe headache with no known cause. ? Nausea or vomiting. ? Seizure. These symptoms may represent a serious problem that is an emergency. Do not wait to see if the symptoms will go away. Get medical help right away. Call your local emergency services (911 in the [place] Do not drive yourself to the hospital. Summary  Atrial fibrillation is a type of irregular or rapid heartbeat (arrhythmia).  Symptoms include a feeling that your heart is beating fast or irregularly.  You may be given medicines to prevent blood clots or to treat heart rate or heart rhythm problems.  Get help right away if you have signs or symptoms of a stroke.  Get help right away if you cannot catch your breath or have chest pain or pressure. This information is not intended to replace advice given to you by your health care provider. Make sure you discuss any questions you have with your health care provider. Document Revised: 06/06/2019 Document  Reviewed: 06/06/2019 Elsevier Patient Education  [place].

## 2020-09-23 NOTE — Progress Notes (Signed)
Chief Complaint  Patient presents with  . Follow-up  . Immunizations   F/u  1. HTN controlled on dil 120 xr, lasix 40, hydralazine 25 bid, losartan 100 mg qd,  2. S/p right shoulder surgery now left shoulder +arthritis saw [facility] 07/2020 with steroid injection declines surgery 1/10 pain today but ROM at times limited both shoulders and will f/u in 10/2020  3. Afib since 2017 f/u Dr. Raliegh Ip and seen 07/2020 due to f/u 01/2021 stable on meds  4. Off balance at times and 1 week ago worse with bending over or sitting to standing felt off balance room was not spinning wants a 3 prong cane given Rx today  Declines PT for now will let me know  No refills needed at this time    Review of Systems  Constitutional: Negative for weight loss.  HENT: Positive for hearing loss.   Eyes: Negative for blurred vision.  Respiratory: Negative for shortness of breath.   Cardiovascular: Negative for chest pain.  Gastrointestinal: Negative for abdominal pain.  Musculoskeletal: Positive for joint pain.  Skin: Negative for rash.  Neurological: Negative for headaches.  Psychiatric/Behavioral: Negative for depression and memory loss.   Past Medical History:  Diagnosis Date  . Allergic rhinitis   . Arthritis   . Basal cell carcinoma 10/03/2019   Right prox. pretibial below knee. Nodular pattern. Tx: EDC  . Basal cell carcinoma 04/21/2020   Right prox. mandible inf. to ear. Nodular pattern.  . Basal cell carcinoma 04/21/2020   Left infranasal. Nodular pattern.  . CHF (congestive heart failure) ([facility]) 04/2016   developed 2 weeks after identifying AF  . Complication of anesthesia   . Does use hearing aid    right ear  . Dysrhythmia 04/2016   a fib    . GERD (gastroesophageal reflux disease)    occasionally has reflux/heartburn  . History of pelvic mass    s/p resection, benign  . Hx of dysplastic nevus 06/13/2014   Right distal dorsum forearm near wrist. Moderate atypia, extends to peripheral margin  . Hx  of dysplastic nevus 02/01/2019   Right top of deltoid. Moderate atypia, limited margins free  . Hx of dysplastic nevus 02/01/2019   Left mid to upper back 5.0cm lateral to spine. Moderate atypia, close to margin  . Hypercholesteremia   . Hypertension   . Mitral regurgitation    noted echo 04/2016   . Osteoporosis    per pt report on Medical Screening Form   . PONV (postoperative nausea and vomiting)   . Pyelonephritis    remote hx   . Shortness of breath dyspnea   . Squamous cell carcinoma of skin 04/02/2020   Left dorsum lat. wrist. SCCis, hypertophic. EDC  . Varicella zoster 2007   Past Surgical History:  Procedure Laterality Date  . [place]   lower back  . bladder prolapse repair    . BREAST CYST EXCISION Left    1990s  . CATARACT EXTRACTION Bilateral 2014   Dr. Sandra Cockayne  . ELECTROPHYSIOLOGIC STUDY N/A 06/15/2016   Procedure: CARDIOVERSION;  Surgeon: [name], MD;  Location: [facility] ORS;  Service: Cardiovascular;  Laterality: N/A;  . EYE SURGERY     bilateral cataract  . FOOT SURGERY    . HERNIA REPAIR  2013   inguinal  . JOINT REPLACEMENT  [id number]   bilateral knee replacements  . KNEE ARTHROSCOPY     right  . RECTOCELE REPAIR    . REVERSE SHOULDER  ARTHROPLASTY Right 06/27/2018   Procedure: REVERSE SHOULDER ARTHROPLASTY;  Surgeon: Corky Mull, MD;  Location: [facility] ORS;  Service: Orthopedics;  Laterality: Right;  . TOTAL KNEE ARTHROPLASTY     left  . VAGINAL HYSTERECTOMY  1970   benign reasons   Family History  Problem Relation Age of Onset  . Emphysema Mother   . Heart disease Father   . Early death Father        died 6 y.o MI   . Breast cancer Maternal Aunt 80   Social History   Socioeconomic History  . Marital status: Widowed    Spouse name: Not on file  . Number of children: Not on file  . Years of education: Not on file  . Highest education level: Not on file  Occupational History  . Not on file  Tobacco Use  . Smoking status:  Never Smoker  . Smokeless tobacco: Never Used  Vaping Use  . Vaping Use: Never used  Substance and Sexual Activity  . Alcohol use: Yes    Alcohol/week: 2.0 standard drinks    Types: 2 Glasses of wine per week    Comment: occassional  . Drug use: No  . Sexual activity: Never  Other Topics Concern  . Not on file  Social History Narrative   Widowed.   Very active within her community.   Enjoys singing, being active in her church, spending time with family.   Lives with son Clayborne Artist 09/23/20   Others son Hoover Browns (oldest [place] 44 y.o) 09/23/20   1 son in [place] (Bob 62 as of 09/23/20)   Tom 59 in [place] 09/23/20   Social Determinants of Health   Financial Resource Strain:   . Difficulty of Paying Living Expenses: Not on file  Food Insecurity:   . Worried About Charity fundraiser in the Last Year: Not on file  . Ran Out of Food in the Last Year: Not on file  Transportation Needs:   . Lack of Transportation (Medical): Not on file  . Lack of Transportation (Non-Medical): Not on file  Physical Activity:   . Days of Exercise per Week: Not on file  . Minutes of Exercise per Session: Not on file  Stress: No Stress Concern Present  . Feeling of Stress : Not at all  Social Connections:   . Frequency of Communication with Friends and Family: Not on file  . Frequency of Social Gatherings with Friends and Family: Not on file  . Attends Religious Services: Not on file  . Active Member of Clubs or Organizations: Not on file  . Attends Archivist Meetings: Not on file  . Marital Status: Not on file  Intimate Partner Violence:   . Fear of Current or Ex-Partner: Not on file  . Emotionally Abused: Not on file  . Physically Abused: Not on file  . Sexually Abused: Not on file   Current Meds  Medication Sig  . acetaminophen (TYLENOL) 500 MG tablet Take 500-1,000 mg by mouth 2 (two) times daily as needed for moderate pain or headache.  . Cholecalciferol (VITAMIN D3) 2000 units capsule  Take 1,000 Units by mouth daily.   . diclofenac sodium (VOLTAREN) 1 % GEL Apply 1 application topically as needed (foot pain).  Marland Kitchen diltiazem (TIAZAC) 120 MG 24 hr capsule Take 120 mg by mouth once daily at night  . docusate calcium (SURFAK) 240 MG capsule Take 240 mg by mouth at bedtime.   . flecainide (TAMBOCOR) 50 MG  tablet Take 1 tablet (50 mg total) by mouth 2 (two) times daily.  . furosemide (LASIX) 40 MG tablet Take 40 mg by mouth daily.   . hydrALAZINE (APRESOLINE) 25 MG tablet Take 1 tablet (25 mg total) by mouth 2 (two) times daily. (Patient taking differently: Take 25 mg by mouth 3 (three) times daily. )  . losartan (COZAAR) 100 MG tablet Take 1 tablet (100 mg total) by mouth daily.  . potassium chloride SA (KLOR-CON) 20 MEQ tablet Take 1 tablet (20 mEq total) by mouth 3 (three) times daily. (Patient taking differently: Take 20 mEq by mouth 2 (two) times daily. )  . pravastatin (PRAVACHOL) 40 MG tablet Take 1 tablet (40 mg total) by mouth every evening.  . rivaroxaban (XARELTO) 20 MG TABS tablet Take 1 tablet (20 mg total) by mouth daily with supper.  . timolol (TIMOPTIC) 0.5 % ophthalmic solution    Allergies  Allergen Reactions  . Amlodipine Swelling    Ankle swelling  . Azo Urinary Tract Support Other (See Comments)    Skin turned orange  . Erythromycin Nausea And Vomiting   Recent Results (from the past 2160 hour(s))  Urinalysis     Status: None   Collection Time: 07/17/20 11:08 AM  Result Value Ref Range   Color, Urine YELLOW YELLOW   APPearance CLEAR CLEAR   Specific Gravity, Urine 1.008 1.001 - 1.03   pH 7.0 5.0 - 8.0   Glucose, UA NEGATIVE NEGATIVE   Bilirubin Urine NEGATIVE NEGATIVE   Ketones, ur NEGATIVE NEGATIVE   Hgb urine dipstick NEGATIVE NEGATIVE   Protein, ur NEGATIVE NEGATIVE   Nitrite NEGATIVE NEGATIVE   Leukocytes,Ua NEGATIVE NEGATIVE  Urine Culture     Status: None   Collection Time: 07/17/20 11:08 AM   Specimen: Urine  Result Value Ref Range    MICRO NUMBER: [id number]    SPECIMEN QUALITY: Adequate    Sample Source NOT GIVEN    STATUS: FINAL    ISOLATE 1:      Less than 10,000 CFU/mL of single Gram positive organism isolated. No further testing will be performed. If clinically indicated, recollection using a method to minimize contamination, with prompt transfer to Urine Culture Transport Tube, is recommended.   Objective  Body mass index is 34.88 kg/m. Wt Readings from Last 3 Encounters:  09/23/20 184 lb 9.6 oz (83.7 kg)  03/20/20 191 lb (86.6 kg)  11/20/19 188 lb (85.3 kg)   Temp Readings from Last 3 Encounters:  09/23/20 97.9 F (36.6 C) (Oral)  03/20/20 (!) 97.2 F (36.2 C) (Temporal)  08/14/19 97.8 F (36.6 C)   BP Readings from Last 3 Encounters:  09/23/20 124/68  03/20/20 124/70  11/28/19 140/84   Pulse Readings from Last 3 Encounters:  09/23/20 62  03/20/20 86  11/28/19 67    Physical Exam Vitals and nursing note reviewed.  Constitutional:      Appearance: Normal appearance. She is well-developed and well-groomed. She is obese.  HENT:     Head: Normocephalic and atraumatic.  Eyes:     Conjunctiva/sclera: Conjunctivae normal.     Pupils: Pupils are equal, round, and reactive to light.  Cardiovascular:     Rate and Rhythm: Normal rate and regular rhythm.     Pulses: Normal pulses.     Heart sounds: Normal heart sounds.     Comments: NSR Pulmonary:     Effort: Pulmonary effort is normal.  Abdominal:     General: Abdomen is flat. Bowel sounds are  normal.  Skin:    General: Skin is warm and dry.  Neurological:     General: No focal deficit present.     Mental Status: She is alert and oriented to person, place, and time. Mental status is at baseline.     Gait: Gait normal.  Psychiatric:        Attention and Perception: Attention and perception normal.        Mood and Affect: Mood and affect normal.        Speech: Speech normal.        Behavior: Behavior normal. Behavior is cooperative.         Thought Content: Thought content normal.        Cognition and Memory: Cognition and memory normal.        Judgment: Judgment normal.     Assessment  Plan  Essential hypertension - Plan: Comprehensive metabolic panel, Lipid panel, CBC with Differential/Platelet Controlled  Cont meds   Prediabetes - Plan: Hemoglobin A1c  Mixed hyperlipidemia On statin cont   Paroxysmal A-fib (HCC) F/u Dr. Raliegh Ip due 01/2021   Gait abnormality -consider PT in future  Rx 3 prong cane today  Obesity (BMI 30-39.9)  Healthy diet and exercise   HM UTD vaccines(I.e prevnar, Tdap had 06/27/12) -flu shotutd given today  rec shingrix vaccine pna 23 had 11/28/19 covid vx 2/2 rec booster in 2 weeks from 09/23/20  Colonoscopy had 12/29/14colonic angioectasia/IH no f/u rec  mammo8/27/20 negordered 08/25/2020 mammo negative  DEXA8/4/20 osteopenia vit d and calcium rec  Out of pap windows/p hysterectomy then ovaries removed in 2002  dermatology [facility] Dr. Billie Ruddy   [facility]  Provider: Dr. Olivia Mackie McLean-Scocuzza-[facility]

## 2020-10-09 DIAGNOSIS — H401132 Primary open-angle glaucoma, bilateral, moderate stage: Secondary | ICD-10-CM | POA: Diagnosis not present

## 2020-10-15 DIAGNOSIS — Z23 Encounter for immunization: Secondary | ICD-10-CM | POA: Diagnosis not present

## 2020-11-11 ENCOUNTER — Other Ambulatory Visit: Payer: Self-pay

## 2020-11-11 ENCOUNTER — Ambulatory Visit (INDEPENDENT_AMBULATORY_CARE_PROVIDER_SITE_OTHER): Payer: Medicare Other | Admitting: Podiatry

## 2020-11-11 DIAGNOSIS — B351 Tinea unguium: Secondary | ICD-10-CM | POA: Diagnosis not present

## 2020-11-11 DIAGNOSIS — M79609 Pain in unspecified limb: Secondary | ICD-10-CM | POA: Diagnosis not present

## 2020-11-11 DIAGNOSIS — L989 Disorder of the skin and subcutaneous tissue, unspecified: Secondary | ICD-10-CM | POA: Diagnosis not present

## 2020-11-11 NOTE — Progress Notes (Signed)
Subjective: Patient is a 86 y.o. female presenting to the office today for follow up evaluation of painful callus lesion(s) noted to the bilateral feet. Bearing weight increases the pain. She has not done anything at home for treatment.  Patient also complains of elongated, thickened nails that cause pain while ambulating in shoes. She is unable to trim her own nails. Patient presents today for further treatment and evaluation.  Past Medical History:  Diagnosis Date  . Allergic rhinitis   . Arthritis   . Basal cell carcinoma 10/03/2019   Right prox. pretibial below knee. Nodular pattern. Tx: EDC  . Basal cell carcinoma 04/21/2020   Right prox. mandible inf. to ear. Nodular pattern.  . Basal cell carcinoma 04/21/2020   Left infranasal. Nodular pattern.  . CHF (congestive heart failure) ([place]) 04/2016   developed 2 weeks after identifying AF  . Complication of anesthesia   . Does use hearing aid    right ear  . Dysrhythmia 04/2016   a fib    . GERD (gastroesophageal reflux disease)    occasionally has reflux/heartburn  . History of pelvic mass    s/p resection, benign  . Hx of dysplastic nevus 06/13/2014   Right distal dorsum forearm near wrist. Moderate atypia, extends to peripheral margin  . Hx of dysplastic nevus 02/01/2019   Right top of deltoid. Moderate atypia, limited margins free  . Hx of dysplastic nevus 02/01/2019   Left mid to upper back 5.0cm lateral to spine. Moderate atypia, close to margin  . Hypercholesteremia   . Hypertension   . Mitral regurgitation    noted echo 04/2016   . Osteoporosis    per pt report on Medical Screening Form   . PONV (postoperative nausea and vomiting)   . Pyelonephritis    remote hx   . Shortness of breath dyspnea   . Squamous cell carcinoma of skin 04/02/2020   Left dorsum lat. wrist. SCCis, hypertophic. EDC  . Varicella zoster 2007    Objective:  Physical Exam General: Alert and oriented x3 in no acute  distress  Dermatology: Hyperkeratotic lesion(s) present on the bilateral feet. Pain on palpation with a central nucleated core noted. Skin is warm, dry and supple bilateral lower extremities. Negative for open lesions or macerations. Nails are tender, long, thickened and dystrophic with subungual debris, consistent with onychomycosis, 1-5 bilateral. No signs of infection noted.  Vascular: Palpable pedal pulses bilaterally. No edema or erythema noted. Capillary refill within normal limits.  Neurological: Epicritic and protective threshold grossly intact bilaterally.   Musculoskeletal Exam: Pain on palpation at the keratotic lesion(s) noted as well as the 5th MPJ of the left foot. Range of motion within normal limits bilateral. Muscle strength 5/5 in all groups bilateral.  Assessment: 1. Onychodystrophic nails 1-5 bilateral with hyperkeratosis of nails.  2. Onychomycosis of nail due to dermatophyte bilateral 3. Pre-ulcerative callus lesions noted to the bilateral feet x 4   Plan of Care:  1. Patient evaluated. 2. Excisional debridement of keratoic lesion(s) using a chisel blade was performed without incident.  3. Dressed with light dressing. 4. Mechanical debridement of nails 1-5 bilaterally performed using a nail nipper. Filed with dremel without incident.  5. Patient is to return to the clinic in 3 months.   Edrick Kins, DPM [facility]  Dr. Edrick Kins, DPM    [address]  [place], [place] [address]                Office [phone]  Fax [phone]   

## 2020-11-18 ENCOUNTER — Ambulatory Visit: Payer: Medicare Other | Admitting: Podiatry

## 2020-11-24 ENCOUNTER — Ambulatory Visit (INDEPENDENT_AMBULATORY_CARE_PROVIDER_SITE_OTHER): Payer: Medicare Other

## 2020-11-24 VITALS — Ht 61.0 in | Wt 184.0 lb

## 2020-11-24 DIAGNOSIS — Z0000 Encounter for general adult medical examination without abnormal findings: Secondary | ICD-10-CM

## 2020-11-24 DIAGNOSIS — M19012 Primary osteoarthritis, left shoulder: Secondary | ICD-10-CM | POA: Diagnosis not present

## 2020-11-24 DIAGNOSIS — Z96611 Presence of right artificial shoulder joint: Secondary | ICD-10-CM | POA: Diagnosis not present

## 2020-11-24 NOTE — Progress Notes (Addendum)
Subjective:   Tina Howard is a 86 y.o. female who presents for Medicare Annual (Subsequent) preventive examination.  Review of Systems    No ROS.  Medicare Wellness Virtual Visit.    Cardiac Risk Factors include: advanced age (>47men, >35 women);hypertension     Objective:    Today's Vitals   11/24/20 1152  Weight: 184 lb (83.5 kg)  Height: 5\' 1"  (1.549 m)   Body mass index is 34.77 kg/m.  Advanced Directives 11/24/2020 11/20/2019 12/08/2018 11/15/2018 06/27/2018 06/20/2018 11/08/2017  Does Patient Have a Medical Advance Directive? Yes Yes Yes Yes Yes Yes Yes  Type of Paramedic of [place];Living will [place];Living will [place];Living will [place];Living will [place];Living will [place];Living will Living will;Healthcare Power of Attorney  Does patient want to make changes to medical advance directive? No - Patient declined No - Patient declined No - Patient declined No - Patient declined No - Patient declined No - Patient declined No - Patient declined  Copy of [place] in Chart? Yes - validated most recent copy scanned in chart (See row information) Yes - validated most recent copy scanned in chart (See row information) No - copy requested Yes - validated most recent copy scanned in chart (See row information) Yes Yes No - copy requested    Current Medications (verified) Outpatient Encounter Medications as of 11/24/2020  Medication Sig   acetaminophen (TYLENOL) 500 MG tablet Take 500-1,000 mg by mouth 2 (two) times daily as needed for moderate pain or headache.   amoxicillin (AMOXIL) 500 MG capsule Take 4 tablets 1 hour prior to dental procedure. (Patient not taking: Reported on 09/23/2020)   Cholecalciferol (VITAMIN D3) 2000 units capsule Take 1,000 Units by mouth daily.    diclofenac sodium (VOLTAREN) 1 % GEL Apply 1  application topically as needed (foot pain).   diltiazem (TIAZAC) 120 MG 24 hr capsule Take 120 mg by mouth once daily at night   docusate calcium (SURFAK) 240 MG capsule Take 240 mg by mouth at bedtime.    flecainide (TAMBOCOR) 50 MG tablet Take 1 tablet (50 mg total) by mouth 2 (two) times daily.   furosemide (LASIX) 40 MG tablet Take 40 mg by mouth daily.    hydrALAZINE (APRESOLINE) 25 MG tablet Take 1 tablet (25 mg total) by mouth 2 (two) times daily. (Patient taking differently: Take 25 mg by mouth 3 (three) times daily. )   losartan (COZAAR) 100 MG tablet Take 1 tablet (100 mg total) by mouth daily.   potassium chloride SA (KLOR-CON) 20 MEQ tablet Take 1 tablet (20 mEq total) by mouth 3 (three) times daily. (Patient taking differently: Take 20 mEq by mouth 2 (two) times daily. )   pravastatin (PRAVACHOL) 40 MG tablet Take 1 tablet (40 mg total) by mouth every evening.   rivaroxaban (XARELTO) 20 MG TABS tablet Take 1 tablet (20 mg total) by mouth daily with supper.   timolol (TIMOPTIC) 0.5 % ophthalmic solution    No facility-administered encounter medications on file as of 11/24/2020.    Allergies (verified) Amlodipine, Azo urinary tract support, and Erythromycin   History: Past Medical History:  Diagnosis Date   Allergic rhinitis    Arthritis    Basal cell carcinoma 10/03/2019   Right prox. pretibial below knee. Nodular pattern. Tx: EDC   Basal cell carcinoma 04/21/2020   Right prox. mandible inf. to ear. Nodular pattern.  Basal cell carcinoma 04/21/2020   Left infranasal. Nodular pattern.   CHF (congestive heart failure) ([place]) 04/2016   developed 2 weeks after identifying AF   Complication of anesthesia    Does use hearing aid    right ear   Dysrhythmia 04/2016   a fib     GERD (gastroesophageal reflux disease)    occasionally has reflux/heartburn   History of pelvic mass    s/p resection, benign   Hx of dysplastic nevus 06/13/2014   Right distal dorsum forearm near  wrist. Moderate atypia, extends to peripheral margin   Hx of dysplastic nevus 02/01/2019   Right top of deltoid. Moderate atypia, limited margins free   Hx of dysplastic nevus 02/01/2019   Left mid to upper back 5.0cm lateral to spine. Moderate atypia, close to margin   Hypercholesteremia    Hypertension    Mitral regurgitation    noted echo 04/2016    Osteoporosis    per pt report on Medical Screening Form    PONV (postoperative nausea and vomiting)    Pyelonephritis    remote hx    Shortness of breath dyspnea    Squamous cell carcinoma of skin 04/02/2020   Left dorsum lat. wrist. SCCis, hypertophic. EDC   Varicella zoster 2007   Past Surgical History:  Procedure Laterality Date   BACK SURGERY  1984   lower back   bladder prolapse repair     BREAST CYST EXCISION Left    1990s   CATARACT EXTRACTION Bilateral 2014   Dr. Sandra Cockayne   ELECTROPHYSIOLOGIC STUDY N/A 06/15/2016   Procedure: CARDIOVERSION;  Surgeon: [name], MD;  Location: [facility] ORS;  Service: Cardiovascular;  Laterality: N/A;   EYE SURGERY     bilateral cataract   FOOT SURGERY     HERNIA REPAIR  2013   inguinal   JOINT REPLACEMENT  2993,7169   bilateral knee replacements   KNEE ARTHROSCOPY     right   RECTOCELE REPAIR     REVERSE SHOULDER ARTHROPLASTY Right 06/27/2018   Procedure: REVERSE SHOULDER ARTHROPLASTY;  Surgeon: Corky Mull, MD;  Location: [facility] ORS;  Service: Orthopedics;  Laterality: Right;   TOTAL KNEE ARTHROPLASTY     left   VAGINAL HYSTERECTOMY  1970   benign reasons   Family History  Problem Relation Age of Onset   Emphysema Mother    Heart disease Father    Early death Father        died 75 y.o MI    Breast cancer Maternal Aunt 80   Social History   Socioeconomic History   Marital status: Widowed    Spouse name: Not on file   Number of children: Not on file   Years of education: Not on file   Highest education level: Not on file  Occupational History   Not on file    Tobacco Use   Smoking status: Never Smoker   Smokeless tobacco: Never Used  Vaping Use   Vaping Use: Never used  Substance and Sexual Activity   Alcohol use: Yes    Alcohol/week: 2.0 standard drinks    Types: 2 Glasses of wine per week    Comment: occassional   Drug use: No   Sexual activity: Never  Other Topics Concern   Not on file  Social History Narrative   Widowed.   Very active within her community.   Enjoys singing, being active in her church, spending time with family.   Lives with son Catalina [place] 76  09/23/20   Others son Hoover Browns (oldest [place] 72 y.o) 09/23/20   1 son in [place] (Bob 62 as of 09/23/20)   Tom 59 in [place] 09/23/20   Social Determinants of Health   Financial Resource Strain: Low Risk    Difficulty of Paying Living Expenses: Not hard at all  Food Insecurity: No Food Insecurity   Worried About Charity fundraiser in the Last Year: Never true   Arboriculturist in the Last Year: Never true  Transportation Needs: No Transportation Needs   Lack of Transportation (Medical): No   Lack of Transportation (Non-Medical): No  Physical Activity:    Days of Exercise per Week: Not on file   Minutes of Exercise per Session: Not on file  Stress: No Stress Concern Present   Feeling of Stress : Not at all  Social Connections: Unknown   Frequency of Communication with Friends and Family: More than three times a week   Frequency of Social Gatherings with Friends and Family: More than three times a week   Attends Religious Services: Not on file   Active Member of Clubs or Organizations: Not on file   Attends Archivist Meetings: Not on file   Marital Status: Widowed    Tobacco Counseling Counseling given: Not Answered   Clinical Intake:  Pre-visit preparation completed: Yes        Diabetes: No  How often do you need to have someone help you when you read instructions, pamphlets, or other written materials from your doctor or pharmacy?: 1 -  Never   Interpreter Needed?: No      Activities of Daily Living In your present state of health, do you have any difficulty performing the following activities: 11/24/2020  Hearing? Y  Comment Hearing aids  Vision? N  Difficulty concentrating or making decisions? N  Walking or climbing stairs? N  Dressing or bathing? N  Doing errands, shopping? N  Preparing Food and eating ? N  Using the Toilet? N  In the past six months, have you accidently leaked urine? Y  Comment Managed with daily liner  Do you have problems with loss of bowel control? N  Managing your Medications? N  Managing your Finances? N  Housekeeping or managing your Housekeeping? N  Some recent data might be hidden    Patient Care Team: McLean-Scocuzza, Nino Glow, MD as PCP - General (Internal Medicine)  Indicate any recent Medical Services you may have received from other than [facility] providers in the past year (date may be approximate).     Assessment:   This is a routine wellness examination for Lauren.  I connected with Biannca today by telephone and verified that I am speaking with the correct person using two identifiers. Location patient: home Location provider: work Persons participating in the virtual visit: patient, Marine scientist.    I discussed the limitations, risks, security and privacy concerns of performing an evaluation and management service by telephone and the availability of in person appointments. The patient expressed understanding and verbally consented to this telephonic visit.    Interactive audio and video telecommunications were attempted between this provider and patient, however failed, due to patient having technical difficulties OR patient did not have access to video capability.  We continued and completed visit with audio only.  Some vital signs may be absent or patient reported.   Hearing/Vision screen  Hearing Screening   125Hz  250Hz  500Hz  1000Hz  2000Hz  3000Hz  4000Hz  6000Hz  8000Hz    Right ear:  Left ear:           Comments: Followed by [facility]  Visits every 6 months  Hearing aid, bilateral  Vision Screening Comments: Followed by [facility] Wears corrective lenses Cataract extraction, bilateral Visual acuity not assessed, virtual visit.  They have seen their ophthalmologist in the last 12 months.    Dietary issues and exercise activities discussed:    Regular diet Good water intake  Goals       Patient Stated     Increase physical activity (pt-stated)      I want to walk more for exercise.       Depression Screen PHQ 2/9 Scores 11/24/2020 09/23/2020 03/20/2020 11/20/2019 11/15/2018 09/20/2018 06/19/2018  PHQ - 2 Score 0 1 0 0 0 0 0  PHQ- 9 Score - - - - - - -    Fall Risk Fall Risk  11/24/2020 09/23/2020 03/20/2020 11/21/2019 11/20/2019  Falls in the past year? 0 0 0 1 0  Comment - - - Emmi Telephone Survey: data to providers prior to load -  Number falls in past yr: 0 0 0 1 -  Comment - - - Emmi Telephone Survey Actual Response = 2 -  Injury with Fall? - 0 0 0 -  Comment - - - - -  Risk for fall due to : - Impaired balance/gait - - -  Follow up Falls evaluation completed Falls evaluation completed Falls evaluation completed - Falls prevention discussed;Education provided   Handrails in use when climbing stairs? Yes Home free of loose throw rugs in walkways, pet beds, electrical cords, etc? Yes  Adequate lighting in your home to reduce risk of falls? Yes   ASSISTIVE DEVICES UTILIZED TO PREVENT FALLS: Life alert? No  Use of a cane, walker or w/c? Cane in use as needed Grab bars in the bathroom? Yes   Shower chair or bench in shower? Yes  Elevated toilet seat or a handicapped toilet? Yes   TIMED UP AND GO: Was the test performed? No . Virtual visit.   Cognitive Function: MMSE - Mini Mental State Exam 10/25/2016  Orientation to time 5  Orientation to Place 5  Registration 3  Attention/ Calculation 5  Recall 3   Language- name 2 objects 2  Language- repeat 1  Language- follow 3 step command 3  Language- read & follow direction 1  Write a sentence 1  Copy design 1  Total score 30     6CIT Screen 11/24/2020 11/20/2019 11/15/2018 11/08/2017  What Year? 0 points 0 points 0 points 0 points  What month? 0 points 0 points 0 points 0 points  What time? 0 points 0 points 0 points 0 points  Count back from 20 - 0 points 0 points 0 points  Months in reverse 0 points 0 points 0 points 0 points  Repeat phrase - 0 points 0 points 0 points  Total Score - 0 0 0    Immunizations Immunization History  Administered Date(s) Administered   Fluad Quad(high Dose 65+) 10/05/2019, 09/23/2020   Influenza Split 09/24/2011, 10/16/2012   Influenza, High Dose Seasonal PF 10/20/2017, 08/29/2018   Influenza,inj,Quad PF,6+ Mos 10/30/2013, 11/13/2014, 11/13/2015   Influenza-Unspecified 10/06/2016   PFIZER SARS-COV-2 Vaccination 02/06/2020, 02/27/2020   Pneumococcal Conjugate-13 05/08/2014   Pneumococcal Polysaccharide-23 09/24/2011, 11/28/2019   Tdap 06/27/2012   Health Maintenance There are no preventive care reminders to display for this patient. Health Maintenance  Topic Date Due   TETANUS/TDAP  06/27/2022  INFLUENZA VACCINE  Completed   DEXA SCAN  Completed   COVID-19 Vaccine  Completed   PNA vac Low Risk Adult  Completed    Colorectal cancer screening: No longer required.    Mammogram status: No longer required.    Bone Density 07/31/19. Repeat every 2 years.   Lung Cancer Screening: (Low Dose CT Chest recommended if Age 69-80 years, 30 pack-year currently smoking OR have quit w/in 15years.) does not qualify.   Vision Screening: Recommended annual ophthalmology exams for early detection of glaucoma and other disorders of the eye. Is the patient up to date with their annual eye exam?  Yes   Dental Screening: Recommended annual dental exams for proper oral hygiene. Visits every 6 months.    Community Resource Referral / Chronic Care Management: CRR required this visit?  No   CCM required this visit?  No      Plan:   Keep all routine maintenance appointments.   Follow up 03/24/21 @ 9:30  I have personally reviewed and noted the following in the patient's chart:   Medical and social history Use of alcohol, tobacco or illicit drugs  Current medications and supplements Functional ability and status Nutritional status Physical activity Advanced directives List of other physicians Hospitalizations, surgeries, and ER visits in previous 12 months Vitals Screenings to include cognitive, depression, and falls Referrals and appointments  In addition, I have reviewed and discussed with patient certain preventive protocols, quality metrics, and best practice recommendations. A written personalized care plan for preventive services as well as general preventive health recommendations were provided to patient via mychart.     OBrien-Blaney, Audrea Bolte L, LPN   [id number]    I have reviewed the above information and agree with above.   Deborra Medina, MD

## 2020-11-24 NOTE — Patient Instructions (Addendum)
Ms. Drouillard , Thank you for taking time to come for your Medicare Wellness Visit. I appreciate your ongoing commitment to your health goals. Please review the following plan we discussed and let me know if I can assist you in the future.   These are the goals we discussed: Goals      Patient Stated   .  Increase physical activity (pt-stated)      I want to walk more for exercise.       This is a list of the screening recommended for you and due dates:  Health Maintenance  Topic Date Due  . Tetanus Vaccine  06/27/2022  . Flu Shot  Completed  . DEXA scan (bone density measurement)  Completed  . COVID-19 Vaccine  Completed  . Pneumonia vaccines  Completed    Immunizations Immunization History  Administered Date(s) Administered  . Fluad Quad(high Dose 65+) 10/05/2019, 09/23/2020  . Influenza Split 09/24/2011, 10/16/2012  . Influenza, High Dose Seasonal PF 10/20/2017, 08/29/2018  . Influenza,inj,Quad PF,6+ Mos 10/30/2013, 11/13/2014, 11/13/2015  . Influenza-Unspecified 10/06/2016  . PFIZER SARS-COV-2 Vaccination 02/06/2020, 02/27/2020  . Pneumococcal Conjugate-13 05/08/2014  . Pneumococcal Polysaccharide-23 09/24/2011, 11/28/2019  . Tdap 06/27/2012   Keep all routine maintenance appointments.   Follow up 03/24/21 @ 9:30  Advanced directives: completed, on file  Conditions/risks identified: none new  Follow up in one year for your annual wellness visit   Preventive Care 65 Years and Older, Female Preventive care refers to lifestyle choices and visits with your health care provider that can promote health and wellness. What does preventive care include?  A yearly physical exam. This is also called an annual well check.  Dental exams once or twice a year.  Routine eye exams. Ask your health care provider how often you should have your eyes checked.  Personal lifestyle choices, including:  Daily care of your teeth and gums.  Regular physical activity.  Eating a  healthy diet.  Avoiding tobacco and drug use.  Limiting alcohol use.  Practicing safe sex.  Taking low-dose aspirin every day.  Taking vitamin and mineral supplements as recommended by your health care provider. What happens during an annual well check? The services and screenings done by your health care provider during your annual well check will depend on your age, overall health, lifestyle risk factors, and family history of disease. Counseling  Your health care provider may ask you questions about your:  Alcohol use.  Tobacco use.  Drug use.  Emotional well-being.  Home and relationship well-being.  Sexual activity.  Eating habits.  History of falls.  Memory and ability to understand (cognition).  Work and work Statistician.  Reproductive health. Screening  You may have the following tests or measurements:  Height, weight, and BMI.  Blood pressure.  Lipid and cholesterol levels. These may be checked every 5 years, or more frequently if you are over 72 years old.  Skin check.  Lung cancer screening. You may have this screening every year starting at age 40 if you have a 30-pack-year history of smoking and currently smoke or have quit within the past 15 years.  Fecal occult blood test (FOBT) of the stool. You may have this test every year starting at age 24.  Flexible sigmoidoscopy or colonoscopy. You may have a sigmoidoscopy every 5 years or a colonoscopy every 10 years starting at age 75.  Hepatitis C blood test.  Hepatitis B blood test.  Sexually transmitted disease (STD) testing.  Diabetes screening. This is  done by checking your blood sugar (glucose) after you have not eaten for a while (fasting). You may have this done every 1-3 years.  Bone density scan. This is done to screen for osteoporosis. You may have this done starting at age 74.  Mammogram. This may be done every 1-2 years. Talk to your health care provider about how often you should  have regular mammograms. Talk with your health care provider about your test results, treatment options, and if necessary, the need for more tests. Vaccines  Your health care provider may recommend certain vaccines, such as:  Influenza vaccine. This is recommended every year.  Tetanus, diphtheria, and acellular pertussis (Tdap, Td) vaccine. You may need a Td booster every 10 years.  Zoster vaccine. You may need this after age 96.  Pneumococcal 13-valent conjugate (PCV13) vaccine. One dose is recommended after age 61.  Pneumococcal polysaccharide (PPSV23) vaccine. One dose is recommended after age 54. Talk to your health care provider about which screenings and vaccines you need and how often you need them. This information is not intended to replace advice given to you by your health care provider. Make sure you discuss any questions you have with your health care provider. Document Released: 01/09/2016 Document Revised: 09/01/2016 Document Reviewed: 10/14/2015 Elsevier Interactive Patient Education  2017 [employer] Prevention in the Home Falls can cause injuries. They can happen to people of all ages. There are many things you can do to make your home safe and to help prevent falls. What can I do on the outside of my home?  Regularly fix the edges of walkways and driveways and fix any cracks.  Remove anything that might make you trip as you walk through a door, such as a raised step or threshold.  Trim any bushes or trees on the path to your home.  Use bright outdoor lighting.  Clear any walking paths of anything that might make someone trip, such as rocks or tools.  Regularly check to see if handrails are loose or broken. Make sure that both sides of any steps have handrails.  Any raised decks and porches should have guardrails on the edges.  Have any leaves, snow, or ice cleared regularly.  Use sand or salt on walking paths during winter.  Clean up any spills in  your garage right away. This includes oil or grease spills. What can I do in the bathroom?  Use night lights.  Install grab bars by the toilet and in the tub and shower. Do not use towel bars as grab bars.  Use non-skid mats or decals in the tub or shower.  If you need to sit down in the shower, use a plastic, non-slip stool.  Keep the floor dry. Clean up any water that spills on the floor as soon as it happens.  Remove soap buildup in the tub or shower regularly.  Attach bath mats securely with double-sided non-slip rug tape.  Do not have throw rugs and other things on the floor that can make you trip. What can I do in the bedroom?  Use night lights.  Make sure that you have a light by your bed that is easy to reach.  Do not use any sheets or blankets that are too big for your bed. They should not hang down onto the floor.  Have a firm chair that has side arms. You can use this for support while you get dressed.  Do not have throw rugs and other things  on the floor that can make you trip. What can I do in the kitchen?  Clean up any spills right away.  Avoid walking on wet floors.  Keep items that you use a lot in easy-to-reach places.  If you need to reach something above you, use a strong step stool that has a grab bar.  Keep electrical cords out of the way.  Do not use floor polish or wax that makes floors slippery. If you must use wax, use non-skid floor wax.  Do not have throw rugs and other things on the floor that can make you trip. What can I do with my stairs?  Do not leave any items on the stairs.  Make sure that there are handrails on both sides of the stairs and use them. Fix handrails that are broken or loose. Make sure that handrails are as long as the stairways.  Check any carpeting to make sure that it is firmly attached to the stairs. Fix any carpet that is loose or worn.  Avoid having throw rugs at the top or bottom of the stairs. If you do have  throw rugs, attach them to the floor with carpet tape.  Make sure that you have a light switch at the top of the stairs and the bottom of the stairs. If you do not have them, ask someone to add them for you. What else can I do to help prevent falls?  Wear shoes that:  Do not have high heels.  Have rubber bottoms.  Are comfortable and fit you well.  Are closed at the toe. Do not wear sandals.  If you use a stepladder:  Make sure that it is fully opened. Do not climb a closed stepladder.  Make sure that both sides of the stepladder are locked into place.  Ask someone to hold it for you, if possible.  Clearly mark and make sure that you can see:  Any grab bars or handrails.  First and last steps.  Where the edge of each step is.  Use tools that help you move around (mobility aids) if they are needed. These include:  Canes.  Walkers.  Scooters.  Crutches.  Turn on the lights when you go into a dark area. Replace any light bulbs as soon as they burn out.  Set up your furniture so you have a clear path. Avoid moving your furniture around.  If any of your floors are uneven, fix them.  If there are any pets around you, be aware of where they are.  Review your medicines with your doctor. Some medicines can make you feel dizzy. This can increase your chance of falling. Ask your doctor what other things that you can do to help prevent falls. This information is not intended to replace advice given to you by your health care provider. Make sure you discuss any questions you have with your health care provider. Document Released: 10/09/2009 Document Revised: 05/20/2016 Document Reviewed: 01/17/2015 Elsevier Interactive Patient Education  2017 [employer].

## 2020-11-28 ENCOUNTER — Ambulatory Visit
Admission: EM | Admit: 2020-11-28 | Discharge: 2020-11-28 | Disposition: A | Discharge status: Home / Self Care | Payer: Medicare Other

## 2020-11-28 DIAGNOSIS — S0993XA Unspecified injury of face, initial encounter: Secondary | ICD-10-CM | POA: Diagnosis not present

## 2020-11-28 DIAGNOSIS — W01198A Fall on same level from slipping, tripping and stumbling with subsequent striking against other object, initial encounter: Secondary | ICD-10-CM | POA: Diagnosis not present

## 2020-11-28 DIAGNOSIS — Z7901 Long term (current) use of anticoagulants: Secondary | ICD-10-CM | POA: Diagnosis not present

## 2020-11-28 DIAGNOSIS — S0011XA Contusion of right eyelid and periocular area, initial encounter: Secondary | ICD-10-CM | POA: Diagnosis not present

## 2020-12-02 ENCOUNTER — Ambulatory Visit: Payer: Medicare Other | Admitting: Internal Medicine

## 2020-12-04 ENCOUNTER — Other Ambulatory Visit: Payer: Self-pay

## 2020-12-04 DIAGNOSIS — I10 Essential (primary) hypertension: Secondary | ICD-10-CM

## 2020-12-04 MED ORDER — HYDRALAZINE HCL 25 MG PO TABS: 25.0000 mg | ORAL_TABLET | Freq: Two times a day (BID) | ORAL | 3 refills | Status: DC

## 2021-01-22 ENCOUNTER — Ambulatory Visit: Payer: Medicare Other | Admitting: Dermatology

## 2021-01-22 ENCOUNTER — Other Ambulatory Visit: Payer: Self-pay

## 2021-01-22 ENCOUNTER — Encounter: Payer: Self-pay | Admitting: Internal Medicine

## 2021-01-22 ENCOUNTER — Telehealth (INDEPENDENT_AMBULATORY_CARE_PROVIDER_SITE_OTHER): Payer: Medicare Other | Admitting: Internal Medicine

## 2021-01-22 DIAGNOSIS — J320 Chronic maxillary sinusitis: Secondary | ICD-10-CM

## 2021-01-22 DIAGNOSIS — E785 Hyperlipidemia, unspecified: Secondary | ICD-10-CM

## 2021-01-22 MED ORDER — AMOXICILLIN-POT CLAVULANATE 875-125 MG PO TABS: 1.0000 | ORAL_TABLET | Freq: Two times a day (BID) | ORAL | 0 refills | Status: DC

## 2021-01-22 MED ORDER — PRAVASTATIN SODIUM 40 MG PO TABS: 40.0000 mg | ORAL_TABLET | Freq: Every evening | ORAL | 3 refills | Status: DC

## 2021-01-22 MED ORDER — PREDNISONE 10 MG PO TABS: ORAL_TABLET | ORAL | 0 refills | Status: DC

## 2021-01-22 NOTE — Progress Notes (Signed)
Telephone  Note  This visit type was conducted due to national recommendations for restrictions regarding the COVID-19 pandemic (e.g. social distancing).  This format is felt to be most appropriate for this patient at this time.  All issues noted in this document were discussed and addressed.  No physical exam was performed (except for noted visual exam findings with Video Visits).   I connected with@ on 01/22/21 at 10:00 AM EST by  telephone and verified that I am speaking with the correct person using two identifiers. Location patient: home Location provider: work or home office Persons participating in the virtual visit: patient, provider  I discussed the limitations, risks, security and privacy concerns of performing an evaluation and management service by telephone and the availability of in person appointments. I also discussed with the patient that there may be a patient responsible charge related to this service. The patient expressed understanding and agreed to proceed.  Reason for visit: left sided headache, sinus congestion  HPI:  87 yr old female with history of seasonal allergic rhinitis and chronic post nasal drip presents with 4 day history of moderate to severe left sided headache and left sinus congestion, accompanied by left maxillary pain Tooth pain, and ear pain behind the ear.  Has been taking "sinus relief" and tylenol round the clock. Her audiologist examined her ear with otoscope and found no signs of an effusion.  SHE denies fevers,  Body aches,  Sore throat and cough.  She has had pfizer vaccines and she limits her excursions to [employer] and [employer] foods    ROS: See pertinent positives and negatives per HPI.  Past Medical History:  Diagnosis Date  . Allergic rhinitis   . Arthritis   . Basal cell carcinoma 10/03/2019   Right prox. pretibial below knee. Nodular pattern. Tx: EDC  . Basal cell carcinoma 04/21/2020   Right prox. mandible inf. to ear. Nodular pattern.  .  Basal cell carcinoma 04/21/2020   Left infranasal. Nodular pattern.  . CHF (congestive heart failure) ([place]) 04/2016   developed 2 weeks after identifying AF  . Complication of anesthesia   . Does use hearing aid    right ear  . Dysrhythmia 04/2016   a fib    . Fall at home, subsequent encounter 08/02/2017  . GERD (gastroesophageal reflux disease)    occasionally has reflux/heartburn  . History of pelvic mass    s/p resection, benign  . Hx of dysplastic nevus 06/13/2014   Right distal dorsum forearm near wrist. Moderate atypia, extends to peripheral margin  . Hx of dysplastic nevus 02/01/2019   Right top of deltoid. Moderate atypia, limited margins free  . Hx of dysplastic nevus 02/01/2019   Left mid to upper back 5.0cm lateral to spine. Moderate atypia, close to margin  . Hypercholesteremia   . Hypertension   . Mitral regurgitation    noted echo 04/2016   . Osteoporosis    per pt report on Medical Screening Form   . PONV (postoperative nausea and vomiting)   . Pyelonephritis    remote hx   . Shortness of breath dyspnea   . Squamous cell carcinoma of skin 04/02/2020   Left dorsum lat. wrist. SCCis, hypertophic. EDC  . Varicella zoster 2007    Past Surgical History:  Procedure Laterality Date  . Study [place]   lower back  . bladder prolapse repair    . BREAST CYST EXCISION Left    1990s  . CATARACT EXTRACTION Bilateral 2014  Dr. Sandra Cockayne  . ELECTROPHYSIOLOGIC STUDY N/A 06/15/2016   Procedure: CARDIOVERSION;  Surgeon: [name], MD;  Location: [facility] ORS;  Service: Cardiovascular;  Laterality: N/A;  . EYE SURGERY     bilateral cataract  . FOOT SURGERY    . HERNIA REPAIR  2013   inguinal  . JOINT REPLACEMENT  [id number]   bilateral knee replacements  . KNEE ARTHROSCOPY     right  . RECTOCELE REPAIR    . REVERSE SHOULDER ARTHROPLASTY Right 06/27/2018   Procedure: REVERSE SHOULDER ARTHROPLASTY;  Surgeon: Corky Mull, MD;  Location: [facility] ORS;  Service:  Orthopedics;  Laterality: Right;  . TOTAL KNEE ARTHROPLASTY     left  . VAGINAL HYSTERECTOMY  1970   benign reasons    Family History  Problem Relation Age of Onset  . Emphysema Mother   . Heart disease Father   . Early death Father        died 54 y.o MI   . Breast cancer Maternal Aunt 80    SOCIAL HX:  reports that she has never smoked. She has never used smokeless tobacco. She reports current alcohol use of about 2.0 standard drinks of alcohol per week. She reports that she does not use drugs.   Current Outpatient Medications:  .  acetaminophen (TYLENOL) 500 MG tablet, Take 500-1,000 mg by mouth 2 (two) times daily as needed for moderate pain or headache., Disp: , Rfl:  .  amoxicillin-clavulanate (AUGMENTIN) 875-125 MG tablet, Take 1 tablet by mouth 2 (two) times daily., Disp: 14 tablet, Rfl: 0 .  Cholecalciferol (VITAMIN D3) 2000 units capsule, Take 1,000 Units by mouth daily. , Disp: , Rfl:  .  diclofenac sodium (VOLTAREN) 1 % GEL, Apply 1 application topically as needed (foot pain)., Disp: 100 g, Rfl: 3 .  diltiazem (TIAZAC) 120 MG 24 hr capsule, Take 120 mg by mouth once daily at night, Disp: , Rfl:  .  docusate calcium (SURFAK) 240 MG capsule, Take 240 mg by mouth at bedtime. , Disp: , Rfl:  .  flecainide (TAMBOCOR) 50 MG tablet, Take 1 tablet (50 mg total) by mouth 2 (two) times daily., Disp: 60 tablet, Rfl: 4 .  furosemide (LASIX) 40 MG tablet, Take 40 mg by mouth daily. , Disp: , Rfl:  .  hydrALAZINE (APRESOLINE) 25 MG tablet, Take 1 tablet (25 mg total) by mouth 2 (two) times daily., Disp: 180 tablet, Rfl: 3 .  losartan (COZAAR) 100 MG tablet, Take 1 tablet (100 mg total) by mouth daily., Disp: 90 tablet, Rfl: 3 .  potassium chloride SA (KLOR-CON) 20 MEQ tablet, Take 1 tablet (20 mEq total) by mouth 3 (three) times daily. (Patient taking differently: Take 20 mEq by mouth 2 (two) times daily.), Disp: 270 tablet, Rfl: 3 .  pravastatin (PRAVACHOL) 40 MG tablet, Take 1 tablet  (40 mg total) by mouth every evening., Disp: 90 tablet, Rfl: 3 .  predniSONE (DELTASONE) 10 MG tablet, 6 tablets on Day 1 , then reduce by 1 tablet daily until gone, Disp: 21 tablet, Rfl: 0 .  rivaroxaban (XARELTO) 20 MG TABS tablet, Take 1 tablet (20 mg total) by mouth daily with supper., Disp: 10 tablet, Rfl: 0 .  timolol (TIMOPTIC) 0.5 % ophthalmic solution, , Disp: , Rfl:   EXAM:  General impression: alert, cooperative and articulate.  No signs of being in distress  Lungs: speech is fluent sentence length suggests that patient is not short of breath and not punctuated by cough, sneezing or  sniffing. Marland Kitchen   Psych: affect normal.  speech is articulate and non pressured .  Denies suicidal thoughts   ASSESSMENT AND PLAN:  Discussed the following assessment and plan:  Left maxillary sinusitis  Left maxillary sinusitis Symptoms have failed to improve with supportive care x 5 days.  No COVID 19 symptoms, and history of chronic PND /allergic rhinitis supports current diagnosis of sinusitis/otitis.  Starting augmentin and prednisone taper .  Advised to follow up with office if not noticing an improvement in 48-72 hours Daily use of a probiotic advised for 3 weeks.     I discussed the assessment and treatment plan with the patient. The patient was provided an opportunity to ask questions and all were answered. The patient agreed with the plan and demonstrated an understanding of the instructions.   The patient was advised to call back or seek an in-person evaluation if the symptoms worsen or if the condition fails to improve as anticipated.  I provided 23 minutes of non-face-to-face time during this encounter.   Crecencio Mc, MD

## 2021-01-22 NOTE — Assessment & Plan Note (Addendum)
Symptoms have failed to improve with supportive care x 5 days.  No COVID 19 symptoms, and history of chronic PND /allergic rhinitis supports current diagnosis of sinusitis/otitis.  Starting augmentin and prednisone taper .  Advised to follow up with office if not noticing an improvement in 48-72 hours Daily use of a probiotic advised for 3 weeks.

## 2021-02-04 ENCOUNTER — Other Ambulatory Visit: Payer: Self-pay | Admitting: Internal Medicine

## 2021-02-04 DIAGNOSIS — E785 Hyperlipidemia, unspecified: Secondary | ICD-10-CM

## 2021-02-06 ENCOUNTER — Ambulatory Visit (INDEPENDENT_AMBULATORY_CARE_PROVIDER_SITE_OTHER): Payer: Medicare Other | Admitting: Internal Medicine

## 2021-02-06 ENCOUNTER — Other Ambulatory Visit: Payer: Self-pay | Admitting: Internal Medicine

## 2021-02-06 ENCOUNTER — Other Ambulatory Visit: Payer: Self-pay

## 2021-02-06 ENCOUNTER — Encounter: Payer: Self-pay | Admitting: Internal Medicine

## 2021-02-06 VITALS — BP 122/60 | HR 73 | Temp 97.9°F | Ht 61.0 in | Wt 190.0 lb

## 2021-02-06 DIAGNOSIS — N939 Abnormal uterine and vaginal bleeding, unspecified: Secondary | ICD-10-CM

## 2021-02-06 DIAGNOSIS — N950 Postmenopausal bleeding: Secondary | ICD-10-CM

## 2021-02-06 DIAGNOSIS — K921 Melena: Secondary | ICD-10-CM

## 2021-02-06 DIAGNOSIS — R350 Frequency of micturition: Secondary | ICD-10-CM

## 2021-02-06 LAB — HEMOCCULT GUIAC POC 1CARD (OFFICE): Fecal Occult Blood, POC: NEGATIVE

## 2021-02-06 NOTE — Patient Instructions (Signed)
Dr. Wannetta Sender urogynecology  [address] [place]  [phone]    Postmenopausal Bleeding Postmenopausal bleeding is any bleeding that a woman has after she has entered menopause. Menopause is the end of a woman's fertile years. After menopause, a woman no longer ovulates and does not have menstrual periods. Therefore, she should no longer have bleeding from her vagina. Postmenopausal bleeding may have various causes, including:  Menopausal hormone therapy (MHT).  Endometrial atrophy. After menopause, low estrogen hormone levels cause the membrane that lines the uterus (endometrium) to become thin. You may have bleeding as the endometrium thins.  Endometrial hyperplasia. This condition is caused by excess estrogen hormones and low levels of progesterone hormones. The excess estrogen causes the endometrium to thicken, which can lead to bleeding. In some cases, this can lead to cancer of the uterus.  Endometrial cancer.  Noncancerous growths (polyps) on the endometrium, the lining of the uterus, or the cervix.  Uterine fibroids. These are noncancerous growths in or around the uterus muscle tissue that can cause heavy bleeding. Any type of postmenopausal bleeding, even if it appears to be a typical menstrual period, should be checked by your health care provider. Treatment will depend on the cause of the bleeding. Follow these instructions at home:  Pay attention to any changes in your symptoms. Let your health care provider know about them.  Avoid using tampons and douches as told by your health care provider.  Change your pads regularly.  Get regular pelvic exams, including Pap tests, as told by your health care provider.  Take iron supplements as told by your health care provider.  Take over-the-counter and prescription medicines only as told by your health care provider.  Keep all follow-up visits. This is important.   Contact a health care provider if:  You have new  bleeding from the vagina after menopause.  You have pain in your abdomen. Get help right away if:  You have a fever or chills.  You have severe pain with bleeding.  You are passing blood clots.  You have heavy bleeding, need more than 1 pad an hour, and have never experienced this before.  You have headaches or feel faint or dizzy. Summary  Postmenopausal bleeding is any bleeding that a woman has after she has entered into menopause.  Postmenopausal bleeding may have various causes. Treatment will depend on the cause of the bleeding.  Any type of postmenopausal bleeding, even if it appears to be a typical menstrual period, should be checked by your health care provider.  Be sure to pay attention to any changes in your symptoms and keep all follow-up visits. This information is not intended to replace advice given to you by your health care provider. Make sure you discuss any questions you have with your health care provider. Document Revised: 05/29/2020 Document Reviewed: 05/29/2020 Elsevier Patient Education  [employer].

## 2021-02-06 NOTE — Addendum Note (Signed)
Addended by: Thressa Sheller on: 02/06/2021 02:25 PM   Modules accepted: Orders

## 2021-02-06 NOTE — Progress Notes (Addendum)
Chief Complaint  Patient presents with  . Vaginal Bleeding   F/u  1. S/p hysterectomy had vaginal bleeding red blood x 2 on toilet tissue and in pad started 02/04/21 no blood in toilet she had a funny feeling s/p hysterectomy age 87 and still has ovaries and had rectocele and cystocele   Review of Systems  Constitutional: Negative for weight loss.  HENT: Negative for hearing loss.   Respiratory: Negative for shortness of breath.   Cardiovascular: Negative for chest pain.  Genitourinary:       +vaginal bleeding  Skin: Negative for rash.   Past Medical History:  Diagnosis Date  . Allergic rhinitis   . Arthritis   . Basal cell carcinoma 10/03/2019   Right prox. pretibial below knee. Nodular pattern. Tx: EDC  . Basal cell carcinoma 04/21/2020   Right prox. mandible inf. to ear. Nodular pattern.  . Basal cell carcinoma 04/21/2020   Left infranasal. Nodular pattern.  . CHF (congestive heart failure) ([place]) 04/2016   developed 2 weeks after identifying AF  . Complication of anesthesia   . Does use hearing aid    right ear  . Dysrhythmia 04/2016   a fib    . Fall at home, subsequent encounter 08/02/2017  . GERD (gastroesophageal reflux disease)    occasionally has reflux/heartburn  . History of pelvic mass    s/p resection, benign  . Hx of dysplastic nevus 06/13/2014   Right distal dorsum forearm near wrist. Moderate atypia, extends to peripheral margin  . Hx of dysplastic nevus 02/01/2019   Right top of deltoid. Moderate atypia, limited margins free  . Hx of dysplastic nevus 02/01/2019   Left mid to upper back 5.0cm lateral to spine. Moderate atypia, close to margin  . Hypercholesteremia   . Hypertension   . Mitral regurgitation    noted echo 04/2016   . Osteoporosis    per pt report on Medical Screening Form   . PONV (postoperative nausea and vomiting)   . Pyelonephritis    remote hx   . Shortness of breath dyspnea   . Squamous cell carcinoma of skin 04/02/2020   Left  dorsum lat. wrist. SCCis, hypertophic. EDC  . Varicella zoster 2007   Past Surgical History:  Procedure Laterality Date  . [place]   lower back  . bladder prolapse repair    . BREAST CYST EXCISION Left    1990s  . CATARACT EXTRACTION Bilateral 2014   Dr. Sandra Cockayne  . ELECTROPHYSIOLOGIC STUDY N/A 06/15/2016   Procedure: CARDIOVERSION;  Surgeon: [name], MD;  Location: [facility] ORS;  Service: Cardiovascular;  Laterality: N/A;  . EYE SURGERY     bilateral cataract  . FOOT SURGERY    . HERNIA REPAIR  2013   inguinal  . JOINT REPLACEMENT  [id number]   bilateral knee replacements  . KNEE ARTHROSCOPY     right  . RECTOCELE REPAIR    . REVERSE SHOULDER ARTHROPLASTY Right 06/27/2018   Procedure: REVERSE SHOULDER ARTHROPLASTY;  Surgeon: Corky Mull, MD;  Location: [facility] ORS;  Service: Orthopedics;  Laterality: Right;  . TOTAL KNEE ARTHROPLASTY     left  . VAGINAL HYSTERECTOMY  1970   benign reasons   Family History  Problem Relation Age of Onset  . Emphysema Mother   . Heart disease Father   . Early death Father        died 66 y.o MI   . Breast cancer Maternal Aunt 80   Social  History   Socioeconomic History  . Marital status: Widowed    Spouse name: Not on file  . Number of children: Not on file  . Years of education: Not on file  . Highest education level: Not on file  Occupational History  . Not on file  Tobacco Use  . Smoking status: Never Smoker  . Smokeless tobacco: Never Used  Vaping Use  . Vaping Use: Never used  Substance and Sexual Activity  . Alcohol use: Yes    Alcohol/week: 2.0 standard drinks    Types: 2 Glasses of wine per week    Comment: occassional  . Drug use: No  . Sexual activity: Never  Other Topics Concern  . Not on file  Social History Narrative   Widowed.   Very active within her community.   Enjoys singing, being active in her church, spending time with family.   Lives with son Clayborne Artist 09/23/20   Others son Hoover Browns  (oldest [place] 67 y.o) 09/23/20   1 son in [place] (Bob 62 as of 09/23/20)   Tom 59 in [place] 09/23/20   Social Determinants of Health   Financial Resource Strain: Low Risk   . Difficulty of Paying Living Expenses: Not hard at all  Food Insecurity: No Food Insecurity  . Worried About Charity fundraiser in the Last Year: Never true  . Ran Out of Food in the Last Year: Never true  Transportation Needs: No Transportation Needs  . Lack of Transportation (Medical): No  . Lack of Transportation (Non-Medical): No  Physical Activity: Not on file  Stress: No Stress Concern Present  . Feeling of Stress : Not at all  Social Connections: Unknown  . Frequency of Communication with Friends and Family: More than three times a week  . Frequency of Social Gatherings with Friends and Family: More than three times a week  . Attends Religious Services: Not on file  . Active Member of Clubs or Organizations: Not on file  . Attends Archivist Meetings: Not on file  . Marital Status: Widowed  Intimate Partner Violence: Not At Risk  . Fear of Current or Ex-Partner: No  . Emotionally Abused: No  . Physically Abused: No  . Sexually Abused: No   Current Meds  Medication Sig  . acetaminophen (TYLENOL) 500 MG tablet Take 500-1,000 mg by mouth 2 (two) times daily as needed for moderate pain or headache.  . Cholecalciferol (VITAMIN D3) 2000 units capsule Take 1,000 Units by mouth daily.   . diclofenac sodium (VOLTAREN) 1 % GEL Apply 1 application topically as needed (foot pain).  Marland Kitchen diltiazem (TIAZAC) 120 MG 24 hr capsule Take 120 mg by mouth once daily at night  . docusate calcium (SURFAK) 240 MG capsule Take 240 mg by mouth at bedtime.   . flecainide (TAMBOCOR) 50 MG tablet Take 1 tablet (50 mg total) by mouth 2 (two) times daily.  . furosemide (LASIX) 40 MG tablet Take 40 mg by mouth daily.   . hydrALAZINE (APRESOLINE) 25 MG tablet Take 1 tablet (25 mg total) by mouth 2 (two) times daily.  Marland Kitchen losartan  (COZAAR) 100 MG tablet Take 1 tablet (100 mg total) by mouth daily.  . potassium chloride SA (KLOR-CON) 20 MEQ tablet Take 1 tablet (20 mEq total) by mouth 3 (three) times daily. (Patient taking differently: Take 20 mEq by mouth 2 (two) times daily.)  . pravastatin (PRAVACHOL) 40 MG tablet TAKE 1 TABLET BY MOUTH IN  THE EVENING  .  rivaroxaban (XARELTO) 20 MG TABS tablet Take 1 tablet (20 mg total) by mouth daily with supper.  . timolol (TIMOPTIC) 0.5 % ophthalmic solution    Allergies  Allergen Reactions  . Amlodipine Swelling    Ankle swelling  . Azo Urinary Tract Support Other (See Comments)    Skin turned orange  . Erythromycin Nausea And Vomiting   Recent Results (from the past 2160 hour(s))  POCT occult blood stool     Status: Normal   Collection Time: 02/06/21 11:02 AM  Result Value Ref Range   Fecal Occult Blood, POC Negative Negative   Card #1 Date     Card #2 Fecal Occult Blod, POC     Card #2 Date     Card #3 Fecal Occult Blood, POC     Card #3 Date    Urinalysis, Complete     Status: Abnormal   Collection Time: 02/06/21  2:38 PM  Result Value Ref Range   Specific Gravity, UA 1.024 1.005 - 1.030   pH, UA 7.0 5.0 - 7.5   Color, UA Yellow Yellow   Appearance Ur Cloudy (A) Clear   Leukocytes,UA 1+ (A) Negative   Protein,UA 2+ (A) Negative/Trace   Glucose, UA Negative Negative   Ketones, UA Negative Negative   RBC, UA 1+ (A) Negative   Bilirubin, UA Negative Negative   Urobilinogen, Ur 1.0 0.2 - 1.0 mg/dL   Nitrite, UA Negative Negative   Microscopic Examination See below:     Comment: Microscopic was indicated and was performed.  Microscopic Examination     Status: Abnormal   Collection Time: 02/06/21  2:38 PM  Result Value Ref Range   WBC, UA >30 (A) 0 - 5 /hpf   RBC >30 (A) 0 - 2 /hpf   Epithelial Cells (non renal) >10 (A) 0 - 10 /hpf   Casts None seen None seen /lpf   Bacteria, UA Few None seen/Few  Urine Culture     Status: None   Collection Time: 02/06/21   2:38 PM   UR  Result Value Ref Range   Urine Culture, Routine Final report    Organism ID, Bacteria Comment     Comment: Mixed urogenital flora 10,000-25,000 colony forming units per mL    Objective  Body mass index is 35.9 kg/m. Wt Readings from Last 3 Encounters:  02/06/21 190 lb (86.2 kg)  01/22/21 184 lb (83.5 kg)  11/24/20 184 lb (83.5 kg)   Temp Readings from Last 3 Encounters:  02/06/21 97.9 F (36.6 C) (Oral)  09/23/20 97.9 F (36.6 C) (Oral)  03/20/20 (!) 97.2 F (36.2 C) (Temporal)   BP Readings from Last 3 Encounters:  02/06/21 122/60  09/23/20 124/68  03/20/20 124/70   Pulse Readings from Last 3 Encounters:  02/06/21 73  09/23/20 62  03/20/20 86    Physical Exam Vitals and nursing note reviewed. Exam conducted with a chaperone present.  Constitutional:      Appearance: Normal appearance. She is well-developed and well-groomed. She is obese.  HENT:     Head: Normocephalic and atraumatic.  Cardiovascular:     Rate and Rhythm: Normal rate and regular rhythm.     Heart sounds: Normal heart sounds. No murmur heard.   Pulmonary:     Effort: Pulmonary effort is normal.     Breath sounds: Normal breath sounds.  Genitourinary:    Pubic Area: No rash.      Labia:        Right: No  rash.        Left: No rash.      Vagina: No signs of injury. No vaginal discharge.     Rectum: External hemorrhoid present.     Comments: Absent cervix and uterus  fobt negative  Right side of vaginal cuff with ? Telangiectasias  Skin:    General: Skin is warm and dry.  Neurological:     General: No focal deficit present.     Mental Status: She is alert and oriented to person, place, and time. Mental status is at baseline.     Gait: Gait normal.  Psychiatric:        Attention and Perception: Attention and perception normal.        Mood and Affect: Mood and affect normal.        Speech: Speech normal.        Behavior: Behavior normal. Behavior is cooperative.         Thought Content: Thought content normal.        Cognition and Memory: Cognition and memory normal.        Judgment: Judgment normal.     Assessment  Plan  Vaginal bleeding  Urine frequency - Plan: Urinalysis, Routine w reflex microscopic, Urine Culture, CANCELED: Urinalysis, Routine w reflex microscopic, CANCELED: Urine Culture  Blood in stool - Plan: POCT occult blood stool  Postmenopausal bleeding - Plan: Ambulatory referral to Urogynecology  HM UTD vaccines(I.e prevnar, Tdap had 06/27/12) -flu shotutd given today  rec shingrix vaccine pna23 had 11/28/19 covid vx 2/2rec booster in 2 weeks from 09/23/20  Colonoscopy had 12/29/14colonic angioectasia/IH no f/u rec  mammo8/27/20 negordered 08/25/2020 mammo negative  DEXA8/4/20 osteopenia vit d and calcium rec  Out of pap windows/p hysterectomy then ovaries removed in 2002  dermatology [facility] Dr. Billie Ruddy   [facility]  Provider: Dr. Olivia Mackie McLean-Scocuzza-[facility]

## 2021-02-08 LAB — URINALYSIS, COMPLETE
Bilirubin, UA: NEGATIVE
Glucose, UA: NEGATIVE
Ketones, UA: NEGATIVE
Nitrite, UA: NEGATIVE
Specific Gravity, UA: 1.024 (ref 1.005–1.030)
Urobilinogen, Ur: 1 mg/dL (ref 0.2–1.0)
pH, UA: 7 (ref 5.0–7.5)

## 2021-02-08 LAB — MICROSCOPIC EXAMINATION
Casts: NONE SEEN /lpf
Epithelial Cells (non renal): >10 /hpf — AB (ref 0–10)
RBC, Urine: >30 /hpf — AB (ref 0–2)
WBC, UA: >30 /hpf — AB (ref 0–5)

## 2021-02-08 LAB — URINE CULTURE

## 2021-02-13 ENCOUNTER — Encounter: Payer: Self-pay | Admitting: Podiatry

## 2021-02-13 ENCOUNTER — Other Ambulatory Visit: Payer: Self-pay

## 2021-02-13 ENCOUNTER — Ambulatory Visit (INDEPENDENT_AMBULATORY_CARE_PROVIDER_SITE_OTHER): Payer: Medicare Other | Admitting: Podiatry

## 2021-02-13 DIAGNOSIS — L989 Disorder of the skin and subcutaneous tissue, unspecified: Secondary | ICD-10-CM | POA: Diagnosis not present

## 2021-02-13 DIAGNOSIS — B351 Tinea unguium: Secondary | ICD-10-CM

## 2021-02-13 DIAGNOSIS — M79676 Pain in unspecified toe(s): Secondary | ICD-10-CM

## 2021-02-13 DIAGNOSIS — M7752 Other enthesopathy of left foot: Secondary | ICD-10-CM

## 2021-02-13 DIAGNOSIS — M79609 Pain in unspecified limb: Secondary | ICD-10-CM

## 2021-02-13 NOTE — Progress Notes (Signed)
Subjective: Patient is a 87 y.o. female presenting to the office today for follow up evaluation of painful callus lesion(s) noted to the bilateral feet. Bearing weight increases the pain. She has not done anything at home for treatment.  Patient also complains of elongated, thickened nails that cause pain while ambulating in shoes. She is unable to trim her own nails. Patient presents today for further treatment and evaluation. Patient states that she is also been applying diclofenac topical to the bilateral feet for chronic foot pain.  She states that it does help alleviate some of her pain.  She presents for further treatment and evaluation  Past Medical History:  Diagnosis Date  . Allergic rhinitis   . Arthritis   . Basal cell carcinoma 10/03/2019   Right prox. pretibial below knee. Nodular pattern. Tx: EDC  . Basal cell carcinoma 04/21/2020   Right prox. mandible inf. to ear. Nodular pattern.  . Basal cell carcinoma 04/21/2020   Left infranasal. Nodular pattern.  . CHF (congestive heart failure) ([place]) 04/2016   developed 2 weeks after identifying AF  . Complication of anesthesia   . Does use hearing aid    right ear  . Dysrhythmia 04/2016   a fib    . Fall at home, subsequent encounter 08/02/2017  . GERD (gastroesophageal reflux disease)    occasionally has reflux/heartburn  . History of pelvic mass    s/p resection, benign  . Hx of dysplastic nevus 06/13/2014   Right distal dorsum forearm near wrist. Moderate atypia, extends to peripheral margin  . Hx of dysplastic nevus 02/01/2019   Right top of deltoid. Moderate atypia, limited margins free  . Hx of dysplastic nevus 02/01/2019   Left mid to upper back 5.0cm lateral to spine. Moderate atypia, close to margin  . Hypercholesteremia   . Hypertension   . Mitral regurgitation    noted echo 04/2016   . Osteoporosis    per pt report on Medical Screening Form   . PONV (postoperative nausea and vomiting)   . Pyelonephritis     remote hx   . Shortness of breath dyspnea   . Squamous cell carcinoma of skin 04/02/2020   Left dorsum lat. wrist. SCCis, hypertophic. EDC  . Varicella zoster 2007    Objective:  Physical Exam General: Alert and oriented x3 in no acute distress  Dermatology: Hyperkeratotic lesion(s) present on the bilateral feet. Pain on palpation with a central nucleated core noted. Skin is warm, dry and supple bilateral lower extremities. Negative for open lesions or macerations. Nails are tender, long, thickened and dystrophic with subungual debris, consistent with onychomycosis, 1-5 bilateral. No signs of infection noted.  Vascular: Palpable pedal pulses bilaterally. No edema or erythema noted. Capillary refill within normal limits.  Neurological: Epicritic and protective threshold grossly intact bilaterally.   Musculoskeletal Exam: Pain on palpation at the keratotic lesion(s) noted as well as the 5th MPJ of the left foot. Range of motion within normal limits bilateral. Muscle strength 5/5 in all groups bilateral.  Assessment: 1. Onychodystrophic nails 1-5 bilateral with hyperkeratosis of nails.  2. Onychomycosis of nail due to dermatophyte bilateral 3. Pre-ulcerative callus lesions noted to the bilateral feet x 4 4.  Fourth and fifth MTPJ capsulitis left   Plan of Care:  1. Patient evaluated. 2. Excisional debridement of keratoic lesion(s) using a chisel blade was performed without incident.  3. Dressed with light dressing. 4. Mechanical debridement of nails 1-5 bilaterally performed using a nail nipper. Filed with  dremel without incident.  5.  Offloading felt metatarsal pad applied to the insole of the patient's shoes.  The patient's shoes are very worn out and do not provide a good supportive sole.  I recommended that the patient look into purchasing a new pair of shoes with good support to see if it helps alleviate her symptoms  6.  Patient is to return to the clinic in 3 months.   Edrick Kins, DPM [facility]  Dr. Edrick Kins, DPM    [address],  [address]                Office [phone]  Fax [phone]

## 2021-03-12 NOTE — Progress Notes (Signed)
[facility] New Patient Evaluation and Consultation  Referring Provider: McLean-Scocuzza, Olivia Mackie * PCP: McLean-Scocuzza, Nino Glow, MD Date of Service: 03/18/2021  SUBJECTIVE Chief Complaint: New Patient (Initial Visit)  History of Present Illness: Tina Howard is a 87 y.o. White or Caucasian female seen in consultation at the request of Dr. Terese Door for evaluation of prolapse and post-menopausal bleeding    Review of records significant for: Has a history of hysterectomy. Has been having bleeding with wiping starting in Feb 2022. Has a cystocele and rectocele.   Urinary Symptoms: Leaks urine with going from sitting to standing, with movement to the bathroom and with urgency Leaks throughout the day.  Pad use: 2 pads per day.   She is bothered by her UI symptoms.  Day time voids 6.  Nocturia: 3 times per night to void. Voiding dysfunction: she empties her bladder well.  does not use a catheter to empty bladder.  When urinating, she feels the need to urinate multiple times in a row. Has an uncomfortable "sensation" after urination.  Drinks: cranberry juice, 3 cups decaf coffee, water (not much) per day  UTIs: 0 UTI's in the last year.   Reports history of blood in urine  Pelvic Organ Prolapse Symptoms:                  She Denies a feeling of a bulge the vaginal area.  Having bleeding from the vagina- bright red blood. Pt reports negative guiac test from PCP. Has a history of prolapse repair at the time of her vaginal hysterectomy (reports fixing cystocele and rectocele)  Bowel Symptom: Bowel movements: 1 time(s) per day Stool consistency: soft  Straining: no.  Splinting: no.  Incomplete evacuation: no.  She Denies accidental bowel leakage / fecal incontinence Bowel regimen: stool softener  Sexual Function Sexually active: no.   Pelvic Pain Denies pelvic pain  Past Medical History:  Past Medical History:  Diagnosis Date  . Allergic rhinitis   .  Arthritis   . Basal cell carcinoma 10/03/2019   Right prox. pretibial below knee. Nodular pattern. Tx: EDC  . Basal cell carcinoma 04/21/2020   Right prox. mandible inf. to ear. Nodular pattern.  . Basal cell carcinoma 04/21/2020   Left infranasal. Nodular pattern.  . CHF (congestive heart failure) ([place]) 04/2016   developed 2 weeks after identifying AF  . Complication of anesthesia   . Does use hearing aid    right ear  . Dysrhythmia 04/2016   a fib    . Fall at home, subsequent encounter 08/02/2017  . GERD (gastroesophageal reflux disease)    occasionally has reflux/heartburn  . History of pelvic mass    s/p resection, benign  . Hx of dysplastic nevus 06/13/2014   Right distal dorsum forearm near wrist. Moderate atypia, extends to peripheral margin  . Hx of dysplastic nevus 02/01/2019   Right top of deltoid. Moderate atypia, limited margins free  . Hx of dysplastic nevus 02/01/2019   Left mid to upper back 5.0cm lateral to spine. Moderate atypia, close to margin  . Hypercholesteremia   . Hypertension   . Mitral regurgitation    noted echo 04/2016   . Osteoporosis    per pt report on Medical Screening Form   . PONV (postoperative nausea and vomiting)   . Pyelonephritis    remote hx   . Shortness of breath dyspnea   . Squamous cell carcinoma of skin 04/02/2020   Left dorsum lat. wrist. SCCis, hypertophic. EDC  .  Varicella zoster 2007     Past Surgical History:   Past Surgical History:  Procedure Laterality Date  . [place]   lower back  . bladder prolapse repair    . BREAST CYST EXCISION Left    1990s  . CATARACT EXTRACTION Bilateral 2014   Dr. Sandra Cockayne  . ELECTROPHYSIOLOGIC STUDY N/A 06/15/2016   Procedure: CARDIOVERSION;  Surgeon: [name], MD;  Location: [facility] ORS;  Service: Cardiovascular;  Laterality: N/A;  . EYE SURGERY     bilateral cataract  . FOOT SURGERY    . HERNIA REPAIR  2013   inguinal  . JOINT REPLACEMENT  [id number]   bilateral  knee replacements  . KNEE ARTHROSCOPY     right  . RECTOCELE REPAIR    . REVERSE SHOULDER ARTHROPLASTY Right 06/27/2018   Procedure: REVERSE SHOULDER ARTHROPLASTY;  Surgeon: Corky Mull, MD;  Location: [facility] ORS;  Service: Orthopedics;  Laterality: Right;  . TOTAL KNEE ARTHROPLASTY     left  . VAGINAL HYSTERECTOMY  1970   benign reasons     Past OB/GYN History: G5 P4 Vaginal deliveries: 4,  Forceps/ Vacuum deliveries: 0, Cesarean section: 0 S/p hysterectomy in 1970   Medications: She has a current medication list which includes the following prescription(s): acetaminophen, vitamin d3, diclofenac sodium, diltiazem, docusate calcium, [START ON 03/19/2021] estradiol, flecainide, furosemide, hydralazine, losartan, potassium chloride sa, pravastatin, rivaroxaban, timolol, and vitamin b-12.   Allergies: Patient is allergic to amlodipine, azo urinary tract support, and erythromycin.   Social History:  Social History   Tobacco Use  . Smoking status: Never Smoker  . Smokeless tobacco: Never Used  Vaping Use  . Vaping Use: Never used  Substance Use Topics  . Alcohol use: Yes    Alcohol/week: 2.0 standard drinks    Types: 2 Glasses of wine per week    Comment: occassional  . Drug use: No     Family History:   Family History  Problem Relation Age of Onset  . Emphysema Mother   . Heart disease Father   . Early death Father        died 20 y.o MI   . Breast cancer Maternal Aunt 80     Review of Systems: Review of Systems  Constitutional: Negative for fever, malaise/fatigue and weight loss.  Respiratory: Negative for cough, shortness of breath and wheezing.   Cardiovascular: Negative for chest pain, palpitations and leg swelling.  Gastrointestinal: Negative for abdominal pain and blood in stool.  Genitourinary: Positive for dysuria.  Musculoskeletal: Negative for myalgias.  Skin: Negative for rash.  Neurological: Negative for dizziness and headaches.  Endo/Heme/Allergies:  Bruises/bleeds easily.  Psychiatric/Behavioral: Negative for depression. The patient is not nervous/anxious.      OBJECTIVE Physical Exam: Vitals:   03/18/21 1047  BP: (!) 177/75  Pulse: 74  Weight: 190 lb (86.2 kg)  Height: 4\' 11"  (1.499 m)    Physical Exam Constitutional:      General: She is not in acute distress. Pulmonary:     Effort: Pulmonary effort is normal.  Abdominal:     General: There is no distension.     Palpations: Abdomen is soft.     Tenderness: There is no abdominal tenderness. There is no rebound.  Musculoskeletal:        General: No swelling. Normal range of motion.  Skin:    General: Skin is warm and dry.     Findings: No rash.  Neurological:     Mental  Status: She is alert and oriented to person, place, and time.  Psychiatric:        Mood and Affect: Mood normal.        Behavior: Behavior normal.     GU / Detailed Urogynecologic Evaluation:  Pelvic Exam: Normal external female genitalia with atrophy; Bartholin's and Skene's glands normal in appearance; urethral meatus normal in appearance, no urethral masses or discharge.   CST: negative  s/p hysterectomy: Speculum exam reveals normal vaginal mucosa with  atrophy and normal vaginal cuff.  Adnexa no mass, fullness, tenderness.     Pelvic floor strength I/V  Pelvic floor musculature: Right levator non-tender, Right obturator non-tender, Left levator non-tender, Left obturator non-tender   As only a small amount of urine was voided, and we needed a larger sample to send to lab, straight catheterization was performed. Clear yellow urine was obtained.   POP-Q:   POP-Q  -1                                            Aa   -1                                           Ba  -6                                              C   3                                            Gh  3                                            Pb  6.5                                            tvl   -2                                             Ap  -2                                            Bp                                                 D     Rectal Exam:  Normal external rectum  Post-Void Residual (PVR) by Bladder Scan: In order to evaluate bladder  emptying, we discussed obtaining a postvoid residual and she agreed to this procedure.  Procedure: The ultrasound unit was placed on the patient's abdomen in the suprapubic region after the patient had voided. A PVR of 39 ml was obtained by bladder scan.  Laboratory Results: POC urine: moderate blood  I visualized the urine specimen, noting the specimen to be clear yellow  ASSESSMENT AND PLAN Ms. Vancamp is a 87 y.o. with:  1. Vaginal atrophy   2. Overactive bladder   3. Urinary frequency   4. Hematuria, unspecified type    1. Vaginal atrophy - For symptomatic vaginal atrophy we discussed estrogen replacement in the form of vaginal cream, vaginal tablets, or a time-released vaginal ring.   - She will start estrace cream 0.5g nightly for two weeks then twice a week after.  - No active bleeding seen on exam today, although we discussed this could be the cause of the bleeding she noted on her pad previously.   2. OAB We discussed the symptoms of overactive bladder (OAB), which include urinary urgency, urinary frequency, nocturia, with or without urge incontinence.  While we do not know the exact etiology of OAB, several treatment options exist. We discussed management including behavioral therapy (decreasing bladder irritants, urge suppression strategies, timed voids, bladder retraining), physical therapy, medication.  - She will work on reducing coffee intake and increasing water. Is also interested in pelvic floor physical therapy. Referral placed to [facility] rehab.   3. Hematuria - blood noted on POC urine. Catheterized specimen obtained in order to send for micro UA and urine culture.   Return 1 month for follow up  Jaquita Folds, MD   Medical Decision Making:  - Reviewed/ ordered a clinical laboratory test - Review and summation of prior records

## 2021-03-18 ENCOUNTER — Other Ambulatory Visit: Payer: Self-pay

## 2021-03-18 ENCOUNTER — Ambulatory Visit (INDEPENDENT_AMBULATORY_CARE_PROVIDER_SITE_OTHER): Payer: Medicare Other | Admitting: Obstetrics and Gynecology

## 2021-03-18 ENCOUNTER — Encounter: Payer: Self-pay | Admitting: Obstetrics and Gynecology

## 2021-03-18 VITALS — BP 177/75 | HR 74 | Ht 59.0 in | Wt 190.0 lb

## 2021-03-18 DIAGNOSIS — N3281 Overactive bladder: Secondary | ICD-10-CM

## 2021-03-18 DIAGNOSIS — N952 Postmenopausal atrophic vaginitis: Secondary | ICD-10-CM

## 2021-03-18 DIAGNOSIS — N8110 Cystocele, unspecified: Secondary | ICD-10-CM

## 2021-03-18 DIAGNOSIS — R319 Hematuria, unspecified: Secondary | ICD-10-CM | POA: Diagnosis not present

## 2021-03-18 DIAGNOSIS — R350 Frequency of micturition: Secondary | ICD-10-CM

## 2021-03-18 LAB — POCT URINALYSIS DIPSTICK
Appearance: NORMAL
Bilirubin, UA: 1
Glucose, UA: NEGATIVE
Ketones, UA: NEGATIVE
Leukocytes, UA: NEGATIVE
Nitrite, UA: NEGATIVE
Protein, UA: NEGATIVE
Spec Grav, UA: 1.01 (ref 1.010–1.025)
Urobilinogen, UA: 0.2 E.U./dL
pH, UA: 8 (ref 5.0–8.0)

## 2021-03-18 MED ORDER — ESTRADIOL 0.1 MG/GM VA CREA: 0.5000 g | TOPICAL_CREAM | VAGINAL | 11 refills | Status: AC

## 2021-03-18 NOTE — Patient Instructions (Signed)
Start vaginal estrogen therapy nightly for two weeks then 2 times weekly at night for treatment of vaginal atrophy (dryness of the vaginal tissues).  Please let us know if the prescription is too expensive and we can look for alternative options.   

## 2021-03-19 LAB — URINALYSIS, MICROSCOPIC ONLY

## 2021-03-20 LAB — URINALYSIS, MICROSCOPIC ONLY
Bacteria, UA: NONE SEEN
Casts: NONE SEEN /lpf
RBC, Urine: >30 /hpf — AB (ref 0–2)

## 2021-03-20 LAB — URINE CULTURE: Organism ID, Bacteria: NO GROWTH

## 2021-03-20 LAB — SPECIMEN STATUS REPORT

## 2021-03-20 NOTE — Addendum Note (Signed)
Addended by: Jaquita Folds on: 03/20/2021 03:14 PM   Modules accepted: Orders

## 2021-03-24 ENCOUNTER — Other Ambulatory Visit: Payer: Self-pay

## 2021-03-24 ENCOUNTER — Encounter: Payer: Self-pay | Admitting: Internal Medicine

## 2021-03-24 ENCOUNTER — Ambulatory Visit (INDEPENDENT_AMBULATORY_CARE_PROVIDER_SITE_OTHER): Payer: Medicare Other | Admitting: Internal Medicine

## 2021-03-24 VITALS — BP 138/72 | HR 73 | Temp 97.5°F | Ht 59.0 in | Wt 189.0 lb

## 2021-03-24 DIAGNOSIS — E559 Vitamin D deficiency, unspecified: Secondary | ICD-10-CM

## 2021-03-24 DIAGNOSIS — Z1231 Encounter for screening mammogram for malignant neoplasm of breast: Secondary | ICD-10-CM

## 2021-03-24 DIAGNOSIS — M8580 Other specified disorders of bone density and structure, unspecified site: Secondary | ICD-10-CM | POA: Diagnosis not present

## 2021-03-24 DIAGNOSIS — I10 Essential (primary) hypertension: Secondary | ICD-10-CM

## 2021-03-24 DIAGNOSIS — M47816 Spondylosis without myelopathy or radiculopathy, lumbar region: Secondary | ICD-10-CM

## 2021-03-24 DIAGNOSIS — R319 Hematuria, unspecified: Secondary | ICD-10-CM

## 2021-03-24 DIAGNOSIS — R7303 Prediabetes: Secondary | ICD-10-CM

## 2021-03-24 DIAGNOSIS — I4891 Unspecified atrial fibrillation: Secondary | ICD-10-CM

## 2021-03-24 DIAGNOSIS — E2839 Other primary ovarian failure: Secondary | ICD-10-CM

## 2021-03-24 DIAGNOSIS — E876 Hypokalemia: Secondary | ICD-10-CM

## 2021-03-24 LAB — CBC WITH DIFFERENTIAL/PLATELET
Basophils Absolute: 0 10*3/uL (ref 0.0–0.1)
Basophils Relative: 0.4 % (ref 0.0–3.0)
Eosinophils Absolute: 0.1 10*3/uL (ref 0.0–0.7)
Eosinophils Relative: 1.7 % (ref 0.0–5.0)
HCT: 37.2 % (ref 36.0–46.0)
Hemoglobin: 12.2 g/dL (ref 12.0–15.0)
Lymphocytes Relative: 19.9 % (ref 12.0–46.0)
Lymphs Abs: 1.5 10*3/uL (ref 0.7–4.0)
MCHC: 32.9 g/dL (ref 30.0–36.0)
MCV: 85.3 fl (ref 78.0–100.0)
Monocytes Absolute: 0.6 10*3/uL (ref 0.1–1.0)
Monocytes Relative: 8.4 % (ref 3.0–12.0)
Neutro Abs: 5.1 10*3/uL (ref 1.4–7.7)
Neutrophils Relative %: 69.6 % (ref 43.0–77.0)
Platelets: 234 10*3/uL (ref 150.0–400.0)
RBC: 4.36 Mil/uL (ref 3.87–5.11)
RDW: 14.5 % (ref 11.5–15.5)
WBC: 7.4 10*3/uL (ref 4.0–10.5)

## 2021-03-24 LAB — COMPREHENSIVE METABOLIC PANEL
ALT: 14 U/L (ref 0–35)
AST: 13 U/L (ref 0–37)
Albumin: 4 g/dL (ref 3.5–5.2)
Alkaline Phosphatase: 46 U/L (ref 39–117)
BUN: 14 mg/dL (ref 6–23)
CO2: 33 mEq/L — ABNORMAL HIGH (ref 19–32)
Calcium: 8.9 mg/dL (ref 8.4–10.5)
Chloride: 103 mEq/L (ref 96–112)
Creatinine, Ser: 0.57 mg/dL (ref 0.40–1.20)
GFR: 82.62 mL/min (ref >60.00)
Glucose, Bld: 123 mg/dL — ABNORMAL HIGH (ref 70–99)
Potassium: 3.6 mEq/L (ref 3.5–5.1)
Sodium: 143 mEq/L (ref 135–145)
Total Bilirubin: 0.5 mg/dL (ref 0.2–1.2)
Total Protein: 6.2 g/dL (ref 6.0–8.3)

## 2021-03-24 LAB — LIPID PANEL
Cholesterol: 171 mg/dL (ref 0–200)
HDL: 67.8 mg/dL (ref >39.00)
LDL Cholesterol: 84 mg/dL (ref 0–99)
NonHDL: 103.45
Total CHOL/HDL Ratio: 3
Triglycerides: 96 mg/dL (ref 0.0–149.0)
VLDL: 19.2 mg/dL (ref 0.0–40.0)

## 2021-03-24 LAB — VITAMIN D 25 HYDROXY (VIT D DEFICIENCY, FRACTURES): VITD: 34.23 ng/mL (ref 30.00–100.00)

## 2021-03-24 LAB — HEMOGLOBIN A1C: Hgb A1c MFr Bld: 6 % (ref 4.6–6.5)

## 2021-03-24 MED ORDER — POTASSIUM CHLORIDE CRYS ER 20 MEQ PO TBCR: 20.0000 meq | EXTENDED_RELEASE_TABLET | Freq: Three times a day (TID) | ORAL | 3 refills | Status: AC

## 2021-03-24 MED ORDER — LOSARTAN POTASSIUM 100 MG PO TABS: 100.0000 mg | ORAL_TABLET | Freq: Every day | ORAL | 3 refills | Status: AC

## 2021-03-24 NOTE — Progress Notes (Signed)
Chief Complaint  Patient presents with  . Follow-up    6 mo f/u   F/u  1. Hematuria and vaginal bleeding urine + blood using estrace cream and will have CT ab/pelvis with and w/o and cystoscopy following with Dr. Ruby Cola disc use on cell phone speaker with pt and Dr. Wannetta Sender use of estrace cream qd x 2 weeks then 2x per week in future   2. Afib appt nst and echo 04/27/21 [facility] cards Dr. Raliegh Ip   3. C/o b/l low back pain reviewed MRI 2011 with moderate to severe arthritis lumbar    Review of Systems  Constitutional: Negative for weight loss.  HENT: Positive for hearing loss.   Eyes: Negative for blurred vision.  Respiratory: Negative for shortness of breath.   Cardiovascular: Negative for chest pain.  Gastrointestinal: Negative for abdominal pain.  Genitourinary: Positive for hematuria.  Musculoskeletal: Positive for back pain.  Skin: Negative for rash.  Neurological: Negative for headaches.  Psychiatric/Behavioral: Negative for depression.   Past Medical History:  Diagnosis Date  . Allergic rhinitis   . Arthritis   . Basal cell carcinoma 10/03/2019   Right prox. pretibial below knee. Nodular pattern. Tx: EDC  . Basal cell carcinoma 04/21/2020   Right prox. mandible inf. to ear. Nodular pattern.  . Basal cell carcinoma 04/21/2020   Left infranasal. Nodular pattern.  . CHF (congestive heart failure) ([place]) 04/2016   developed 2 weeks after identifying AF  . Complication of anesthesia   . Does use hearing aid    right ear  . Dysrhythmia 04/2016   a fib    . Fall at home, subsequent encounter 08/02/2017  . GERD (gastroesophageal reflux disease)    occasionally has reflux/heartburn  . History of pelvic mass    s/p resection, benign  . Hx of dysplastic nevus 06/13/2014   Right distal dorsum forearm near wrist. Moderate atypia, extends to peripheral margin  . Hx of dysplastic nevus 02/01/2019   Right top of deltoid. Moderate atypia, limited margins free  . Hx of dysplastic nevus  02/01/2019   Left mid to upper back 5.0cm lateral to spine. Moderate atypia, close to margin  . Hypercholesteremia   . Hypertension   . Mitral regurgitation    noted echo 04/2016   . Osteoporosis    per pt report on Medical Screening Form   . PONV (postoperative nausea and vomiting)   . Pyelonephritis    remote hx   . Shortness of breath dyspnea   . Squamous cell carcinoma of skin 04/02/2020   Left dorsum lat. wrist. SCCis, hypertophic. EDC  . Varicella zoster 2007   Past Surgical History:  Procedure Laterality Date  . BACK SURGERY  1984   lower back L5 had surgery  . bladder prolapse repair    . BREAST CYST EXCISION Left    1990s  . CATARACT EXTRACTION Bilateral 2014   Dr. Sandra Cockayne  . ELECTROPHYSIOLOGIC STUDY N/A 06/15/2016   Procedure: CARDIOVERSION;  Surgeon: [name], MD;  Location: [facility] ORS;  Service: Cardiovascular;  Laterality: N/A;  . EYE SURGERY     bilateral cataract  . FOOT SURGERY    . HERNIA REPAIR  2013   inguinal  . JOINT REPLACEMENT  [id number]   bilateral knee replacements  . KNEE ARTHROSCOPY     right  . pelvic mass removed 3x7 x 11 1990s or early 2000s     s/p hysterectomy   . RECTOCELE REPAIR    . REVERSE SHOULDER ARTHROPLASTY Right  06/27/2018   Procedure: REVERSE SHOULDER ARTHROPLASTY;  Surgeon: Corky Mull, MD;  Location: [facility] ORS;  Service: Orthopedics;  Laterality: Right;  . TOTAL KNEE ARTHROPLASTY     left  . VAGINAL HYSTERECTOMY  1970   benign reasons   Family History  Problem Relation Age of Onset  . Emphysema Mother   . Heart disease Father   . Early death Father        died 15 y.o MI   . Breast cancer Maternal Aunt 80  . Kidney Stones Son    Social History   Socioeconomic History  . Marital status: Widowed    Spouse name: Not on file  . Number of children: Not on file  . Years of education: Not on file  . Highest education level: Not on file  Occupational History  . Not on file  Tobacco Use  . Smoking status: Never  Smoker  . Smokeless tobacco: Never Used  Vaping Use  . Vaping Use: Never used  Substance and Sexual Activity  . Alcohol use: Yes    Alcohol/week: 2.0 standard drinks    Types: 2 Glasses of wine per week    Comment: occassional  . Drug use: No  . Sexual activity: Never  Other Topics Concern  . Not on file  Social History Narrative   Widowed.   Very active within her community.   Enjoys singing, being active in her church, spending time with family.   Lives with son Clayborne Artist 09/23/20   Others son Hoover Browns (oldest [place] 15 y.o) 09/23/20   1 son in [place] (Bob 62 as of 09/23/20)   Tom 59 in [place] 09/23/20   Social Determinants of Health   Financial Resource Strain: Low Risk   . Difficulty of Paying Living Expenses: Not hard at all  Food Insecurity: No Food Insecurity  . Worried About Charity fundraiser in the Last Year: Never true  . Ran Out of Food in the Last Year: Never true  Transportation Needs: No Transportation Needs  . Lack of Transportation (Medical): No  . Lack of Transportation (Non-Medical): No  Physical Activity: Not on file  Stress: No Stress Concern Present  . Feeling of Stress : Not at all  Social Connections: Unknown  . Frequency of Communication with Friends and Family: More than three times a week  . Frequency of Social Gatherings with Friends and Family: More than three times a week  . Attends Religious Services: Not on file  . Active Member of Clubs or Organizations: Not on file  . Attends Archivist Meetings: Not on file  . Marital Status: Widowed  Intimate Partner Violence: Not At Risk  . Fear of Current or Ex-Partner: No  . Emotionally Abused: No  . Physically Abused: No  . Sexually Abused: No   Current Meds  Medication Sig  . acetaminophen (TYLENOL) 500 MG tablet Take 500-1,000 mg by mouth 2 (two) times daily as needed for moderate pain or headache.  . Cholecalciferol (VITAMIN D3) 2000 units capsule Take 1,000 Units by mouth daily.   .  diclofenac sodium (VOLTAREN) 1 % GEL Apply 1 application topically as needed (foot pain).  Marland Kitchen diltiazem (TIAZAC) 120 MG 24 hr capsule Take 120 mg by mouth once daily at night  . docusate calcium (SURFAK) 240 MG capsule Take 240 mg by mouth at bedtime.   Marland Kitchen estradiol (ESTRACE) 0.1 MG/GM vaginal cream Place 0.5 g vaginally 2 (two) times a week. Place 0.5g nightly for  two weeks then twice a week after  . flecainide (TAMBOCOR) 50 MG tablet Take 1 tablet (50 mg total) by mouth 2 (two) times daily.  . furosemide (LASIX) 40 MG tablet Take 40 mg by mouth daily.   . hydrALAZINE (APRESOLINE) 25 MG tablet Take 1 tablet (25 mg total) by mouth 2 (two) times daily.  . pravastatin (PRAVACHOL) 40 MG tablet TAKE 1 TABLET BY MOUTH IN  THE EVENING  . rivaroxaban (XARELTO) 20 MG TABS tablet Take 1 tablet (20 mg total) by mouth daily with supper.  . timolol (TIMOPTIC) 0.5 % ophthalmic solution   . vitamin B-12 (CYANOCOBALAMIN) 1000 MCG tablet Take 1,000 mcg by mouth daily.  . [DISCONTINUED] losartan (COZAAR) 100 MG tablet Take 1 tablet (100 mg total) by mouth daily.  . [DISCONTINUED] potassium chloride SA (KLOR-CON) 20 MEQ tablet Take 1 tablet (20 mEq total) by mouth 3 (three) times daily. (Patient taking differently: Take 20 mEq by mouth 2 (two) times daily.)   Allergies  Allergen Reactions  . Amlodipine Swelling    Ankle swelling  . Azo Urinary Tract Support Other (See Comments)    Skin turned orange  . Erythromycin Nausea And Vomiting   Recent Results (from the past 2160 hour(s))  POCT occult blood stool     Status: Normal   Collection Time: 02/06/21 11:02 AM  Result Value Ref Range   Fecal Occult Blood, POC Negative Negative   Card #1 Date     Card #2 Fecal Occult Blod, POC     Card #2 Date     Card #3 Fecal Occult Blood, POC     Card #3 Date    Urinalysis, Complete     Status: Abnormal   Collection Time: 02/06/21  2:38 PM  Result Value Ref Range   Specific Gravity, UA 1.024 1.005 - 1.030   pH, UA  7.0 5.0 - 7.5   Color, UA Yellow Yellow   Appearance Ur Cloudy (A) Clear   Leukocytes,UA 1+ (A) Negative   Protein,UA 2+ (A) Negative/Trace   Glucose, UA Negative Negative   Ketones, UA Negative Negative   RBC, UA 1+ (A) Negative   Bilirubin, UA Negative Negative   Urobilinogen, Ur 1.0 0.2 - 1.0 mg/dL   Nitrite, UA Negative Negative   Microscopic Examination See below:     Comment: Microscopic was indicated and was performed.  Microscopic Examination     Status: Abnormal   Collection Time: 02/06/21  2:38 PM  Result Value Ref Range   WBC, UA >30 (A) 0 - 5 /hpf   RBC >30 (A) 0 - 2 /hpf   Epithelial Cells (non renal) >10 (A) 0 - 10 /hpf   Casts None seen None seen /lpf   Bacteria, UA Few None seen/Few  Urine Culture     Status: None   Collection Time: 02/06/21  2:38 PM   UR  Result Value Ref Range   Urine Culture, Routine Final report    Organism ID, Bacteria Comment     Comment: Mixed urogenital flora 10,000-25,000 colony forming units per mL   POCT Urinalysis Dipstick     Status: None   Collection Time: 03/18/21 11:40 AM  Result Value Ref Range   Color, UA Yellow    Clarity, UA Cloudy    Glucose, UA Negative Negative   Bilirubin, UA 1    Ketones, UA Negative    Spec Grav, UA 1.010 1.010 - 1.025   Blood, UA Moderate    pH, UA  8.0 5.0 - 8.0   Protein, UA Negative Negative   Urobilinogen, UA 0.2 0.2 or 1.0 E.U./dL   Nitrite, UA Negative    Leukocytes, UA Negative Negative   Appearance Normal    Odor None   Urine Culture     Status: None   Collection Time: 03/18/21 12:02 PM   Specimen: Urine   Urine  Result Value Ref Range   Urine Culture, Routine Final report    Organism ID, Bacteria No growth   Urine Microscopic     Status: Abnormal   Collection Time: 03/18/21 12:02 PM  Result Value Ref Range   WBC, UA 0-5 0 - 5 /hpf   RBC >30 (A) 0 - 2 /hpf   Epithelial Cells (non renal) 0-10 0 - 10 /hpf   Casts None seen None seen /lpf   Bacteria, UA None seen None seen/Few   Specimen status report     Status: None   Collection Time: 03/18/21 12:02 PM  Result Value Ref Range   specimen status report Comment     Comment: Written Authorization Written Authorization Written Authorization Received. Authorization received from [place] 03-19-2021 Logged by Merri Brunette   Urinalysis, microscopic only     Status: None   Collection Time: 03/18/21 12:03 PM  Result Value Ref Range   WBC, UA CANCELED /hpf    Comment: Please refer to the following specimen for additional lab results.  [phone]  Result canceled by the ancillary.    Epithelial Cells (non renal) CANCELED     Comment: Test not performed  Result canceled by the ancillary.    Objective  Body mass index is 38.17 kg/m. Wt Readings from Last 3 Encounters:  03/24/21 189 lb (85.7 kg)  03/18/21 190 lb (86.2 kg)  02/06/21 190 lb (86.2 kg)   Temp Readings from Last 3 Encounters:  03/24/21 (!) 97.5 F (36.4 C)  02/06/21 97.9 F (36.6 C) (Oral)  09/23/20 97.9 F (36.6 C) (Oral)   BP Readings from Last 3 Encounters:  03/24/21 138/72  03/18/21 (!) 177/75  02/06/21 122/60   Pulse Readings from Last 3 Encounters:  03/24/21 73  03/18/21 74  02/06/21 73    Physical Exam Vitals and nursing note reviewed.  Constitutional:      Appearance: Normal appearance. She is well-developed and well-groomed. She is obese.  HENT:     Head: Normocephalic and atraumatic.  Eyes:     Conjunctiva/sclera: Conjunctivae normal.     Pupils: Pupils are equal, round, and reactive to light.  Cardiovascular:     Rate and Rhythm: Normal rate and regular rhythm.     Heart sounds: Murmur heard.    Pulmonary:     Effort: Pulmonary effort is normal.     Breath sounds: Normal breath sounds.  Abdominal:     Tenderness: There is no abdominal tenderness. There is no right CVA tenderness or left CVA tenderness.  Musculoskeletal:     Lumbar back: Tenderness present.       Back:  Skin:    General: Skin is  warm and dry.  Neurological:     General: No focal deficit present.     Mental Status: She is alert and oriented to person, place, and time. Mental status is at baseline.     Gait: Gait normal.  Psychiatric:        Attention and Perception: Attention and perception normal.        Mood and Affect: Mood and affect normal.  Speech: Speech normal.        Behavior: Behavior normal. Behavior is cooperative.        Thought Content: Thought content normal.        Cognition and Memory: Cognition and memory normal.        Judgment: Judgment normal.     Assessment  Plan  Hypertension, unspecified type - Plan: Comprehensive metabolic panel, Lipid panel, CBC with Differential/Platelet On dilt 120 mg qd  On lasix40 mg qd with K tid Hydralazine 25 bid Losartan 100 mg qd  Prediabetes - Plan: Hemoglobin A1c  Vitamin D deficiency - Plan: Vitamin D (25 hydroxy)  Hematuria, unspecified type Pending ct and cystoscopy   Atrial fibrillation, unspecified type ([place]) F/u cards kc Dr. Raliegh Ip echo and NST   Arthritis of lumbar spine Consider MRI low back if not improved   Hypokalemia - Plan: potassium chloride SA (KLOR-CON) 20 MEQ tablet tid   HM UTD vaccines(I.e prevnar, Tdap had 06/27/12) -flu shotutd rec shingrix vaccine pna23 had 11/28/19 covid vx 2/2rec booster in 2 weeks from 09/23/20  Colonoscopy had 12/29/14colonic angioectasia/IH no f/u rec  mammo8/27/20 negordered8/30/2021 mammonegative ordered Ordered  DEXA8/4/20 osteopenia vit d and calcium rec ordered   Out of pap windows/p hysterectomy then ovaries removed in 2002  dermatology [facility] Dr. Billie Ruddy  10/30/10 MRI L spine IMPRESSION:   Multilevel multifactorial degenerative changes which appear to be most  severe at the L3-L4 level with moderate to severe thecal sac stenosis at  the  L 5 S1 level with areas of moderate to severe neuroforaminal narrowing on  the left and moderate on the right. Exiting nerve  root compression on the  left is a diagnostic concern. There are also findings concerning for mass  effect upon the central aspect of the exiting S1 nerve root on the right  at  L5-S1.    [facility]  Provider: Dr. Olivia Mackie McLean-Scocuzza-Internal Medicine

## 2021-04-08 ENCOUNTER — Other Ambulatory Visit: Payer: Self-pay

## 2021-04-08 ENCOUNTER — Ambulatory Visit
Admission: RE | Admit: 2021-04-08 | Discharge: 2021-04-08 | Disposition: A | Discharge status: Home / Self Care | Payer: Medicare Other | Source: Ambulatory Visit | Attending: Obstetrics and Gynecology | Admitting: Obstetrics and Gynecology

## 2021-04-08 DIAGNOSIS — R319 Hematuria, unspecified: Secondary | ICD-10-CM | POA: Diagnosis not present

## 2021-04-08 MED ORDER — IOHEXOL 300 MG/ML  SOLN
125.0000 mL | Freq: Once | INTRAMUSCULAR | Status: AC | PRN
  Administered 2021-04-08: 125 mL via INTRAVENOUS

## 2021-04-14 NOTE — Progress Notes (Signed)
CYSTOSCOPY  CC:  This is a 87 y.o. with gross hematuria who presents today for cystoscopy.  We reviewed the results of her CT scan (04/08/21): IMPRESSION: 1. Multiple small bilateral nonobstructive renal calculi and or renal vascular calcifications. No ureteral calculi or hydronephrosis. 2. No suspicious mass or contrast enhancement. 3. No urinary tract filling defect on delayed phase imaging. 4. Status post hysterectomy. 5. Left eccentric fat containing low midline ventral hernia.  POC urine: negative  BP (!) 155/82   Pulse 79   Wt 189 lb (85.7 kg)   BMI 38.17 kg/m   CYSTOSCOPY: A time out was performed.  The periurethral area was prepped and draped in a sterile manner.  2% lidocaine jetpack was inserted at the urethral meatus and the urethra and bladder visualized with 0- and 70-degree scopes.  She had normal urethral coaptation and normal urethral mucosa. 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 She had bilateral clear efflux from both ureteral orifices.  She had no squamous metaplasia at the trigone, no trabeculations, cellules or diverticuli.     ASSESSMENT:  87 y.o. with gross hematuria. Cystoscopy today is normal bladder with cystitis cystica  PLAN:  - Reviewed results of CT scan and that she has multiple small stones that are currently not causing any obstruction. Therefore would not need any treatment currently.  - We can reassess urinalysis in 1 year per AUA guidelines and decide if further workup is needed.  - She will plan to follow up with physical therapy for her incontinence symptoms and will return in a few months after treatment.  - She states using the estrace cream has been too difficult to place and she has not noticed any difference. Therefore she can discontinue use.     Jaquita Folds, MD

## 2021-04-15 ENCOUNTER — Ambulatory Visit (INDEPENDENT_AMBULATORY_CARE_PROVIDER_SITE_OTHER): Payer: Medicare Other | Admitting: Obstetrics and Gynecology

## 2021-04-15 ENCOUNTER — Encounter: Payer: Self-pay | Admitting: Obstetrics and Gynecology

## 2021-04-15 ENCOUNTER — Other Ambulatory Visit: Payer: Self-pay

## 2021-04-15 VITALS — BP 155/82 | HR 79 | Wt 189.0 lb

## 2021-04-15 DIAGNOSIS — R310 Gross hematuria: Secondary | ICD-10-CM

## 2021-04-15 NOTE — Patient Instructions (Signed)
Taking Care of Yourself after Urodynamics, Cystoscopy, Coaptite Injection, or Botox Injection   Drink plenty of water for a day or two following your procedure. Try to have about 8 ounces (one cup) at a time, and do this 6 times or more per day unless you have fluid restrictitons AVOID irritative beverages such as coffee, tea, soda, alcoholic or citrus drinks for a day or two, as this may cause burning with urination.  For the first 1-2 days after the procedure, your urine may be pink or red in color. You may have some blood in your urine as a normal side effect of the procedure. Large amounts of bleeding or difficulty urinating are NOT normal. Call the nurse line if this happens or go to the nearest Emergency Room if the bleeding is heavy or you cannot urinate at all and it is after hours.  You may experience some discomfort or a burning sensation with urination after having this procedure. You can use over the counter Azo or pyridium to help with burning and follow the instructions on the packaging. If it does not improve within 1-2 days, or other symptoms appear (fever, chills, or difficulty urinating) call the office to speak to a nurse.  You may return to normal daily activities such as work, school, driving, exercising and housework on the day of the procedure. If your doctor gave you a prescription, take it as ordered.  If you need a return appointment, the front desk staff will arrange it when you check out.   

## 2021-04-21 ENCOUNTER — Ambulatory Visit: Payer: Medicare Other | Admitting: Obstetrics and Gynecology

## 2021-04-24 ENCOUNTER — Ambulatory Visit (INDEPENDENT_AMBULATORY_CARE_PROVIDER_SITE_OTHER): Payer: Medicare Other | Admitting: Podiatry

## 2021-04-24 ENCOUNTER — Encounter: Payer: Self-pay | Admitting: Podiatry

## 2021-04-24 ENCOUNTER — Other Ambulatory Visit: Payer: Self-pay

## 2021-04-24 ENCOUNTER — Ambulatory Visit (INDEPENDENT_AMBULATORY_CARE_PROVIDER_SITE_OTHER): Payer: Medicare Other

## 2021-04-24 DIAGNOSIS — R600 Localized edema: Secondary | ICD-10-CM

## 2021-04-24 DIAGNOSIS — M79675 Pain in left toe(s): Secondary | ICD-10-CM

## 2021-04-24 DIAGNOSIS — M79674 Pain in right toe(s): Secondary | ICD-10-CM

## 2021-04-24 DIAGNOSIS — B351 Tinea unguium: Secondary | ICD-10-CM | POA: Diagnosis not present

## 2021-04-24 NOTE — Progress Notes (Signed)
Subjective: Patient is a 87 y.o. female presenting to the office today for follow up evaluation of painful callus lesion(s) noted to the bilateral feet. Bearing weight increases the pain. She has not done anything at home for treatment.  Patient also complains of elongated, thickened nails that cause pain while ambulating in shoes. She is unable to trim her own nails. Patient presents today for further treatment and evaluation. Patient states that she is also been applying diclofenac topical to the bilateral feet for chronic foot pain.  She states that it does help alleviate some of her pain.   Patient also has a new complaint today regarding bilateral lower extremity edema that she states is been present for the past month.  She is currently taking Lasix and she is being treated managed by gynecological urologist at [facility] in [place].  She is concerned for the swelling of the feet and would like to have it evaluated today.  She presents for further treatment and evaluation  Past Medical History:  Diagnosis Date  . Allergic rhinitis   . Arthritis   . Basal cell carcinoma 10/03/2019   Right prox. pretibial below knee. Nodular pattern. Tx: EDC  . Basal cell carcinoma 04/21/2020   Right prox. mandible inf. to ear. Nodular pattern.  . Basal cell carcinoma 04/21/2020   Left infranasal. Nodular pattern.  . CHF (congestive heart failure) ([place]) 04/2016   developed 2 weeks after identifying AF  . Complication of anesthesia   . Does use hearing aid    right ear  . Dysrhythmia 04/2016   a fib    . Fall at home, subsequent encounter 08/02/2017  . GERD (gastroesophageal reflux disease)    occasionally has reflux/heartburn  . History of pelvic mass    s/p resection, benign  . Hx of dysplastic nevus 06/13/2014   Right distal dorsum forearm near wrist. Moderate atypia, extends to peripheral margin  . Hx of dysplastic nevus 02/01/2019   Right top of deltoid. Moderate atypia, limited  margins free  . Hx of dysplastic nevus 02/01/2019   Left mid to upper back 5.0cm lateral to spine. Moderate atypia, close to margin  . Hypercholesteremia   . Hypertension   . Mitral regurgitation    noted echo 04/2016   . Osteoporosis    per pt report on Medical Screening Form   . PONV (postoperative nausea and vomiting)   . Pyelonephritis    remote hx   . Shortness of breath dyspnea   . Squamous cell carcinoma of skin 04/02/2020   Left dorsum lat. wrist. SCCis, hypertophic. EDC  . Varicella zoster 2007    Objective:  Physical Exam General: Alert and oriented x3 in no acute distress  Dermatology: Hyperkeratotic lesion(s) present on the bilateral feet. Pain on palpation with a central nucleated core noted. Skin is warm, dry and supple bilateral lower extremities. Negative for open lesions or macerations. Nails are tender, long, thickened and dystrophic with subungual debris, consistent with onychomycosis, 1-5 bilateral. No signs of infection noted.  Vascular: Palpable pedal pulses bilaterally.  Pitting bilateral lower extremity edema noted. Capillary refill within normal limits.  Neurological: Epicritic and protective threshold grossly intact bilaterally.   Musculoskeletal Exam: Pain on palpation at the keratotic lesion(s) noted as well as the 5th MPJ of the left foot. Range of motion within normal limits bilateral. Muscle strength 5/5 in all groups bilateral.  Assessment: 1. Onychodystrophic nails 1-5 bilateral with hyperkeratosis of nails.  2. Onychomycosis of nail due to  dermatophyte bilateral 3. Pre-ulcerative callus lesions noted to the bilateral feet x 4 4.  Lower extremity edema bilateral   Plan of Care:  1. Patient evaluated. 2. Excisional debridement of keratoic lesion(s) using a chisel blade was performed without incident.  3. Dressed with light dressing. 4. Mechanical debridement of nails 1-5 bilaterally performed using a nail nipper. Filed with dremel without  incident.  5.  Continue felt offload metatarsal pad to the insoles of the shoes 6.  Continue management with PCP and gynecological urologist for lower extremity edema.  I believe this is not associated to any foot pathology primarily  7.  Patient is to return to the clinic in 3 months.   Edrick Kins, DPM [facility]  Dr. Edrick Kins, DPM    [address], [place] [address]                Office [phone]  Fax [phone]

## 2021-04-30 ENCOUNTER — Other Ambulatory Visit: Payer: Self-pay | Admitting: Podiatry

## 2021-04-30 DIAGNOSIS — R600 Localized edema: Secondary | ICD-10-CM

## 2021-05-15 ENCOUNTER — Ambulatory Visit: Payer: Medicare Other | Admitting: Podiatry

## 2021-05-20 ENCOUNTER — Encounter: Payer: Self-pay | Admitting: Internal Medicine

## 2021-05-20 ENCOUNTER — Other Ambulatory Visit: Payer: Self-pay

## 2021-05-20 ENCOUNTER — Ambulatory Visit: Payer: Medicare Other | Admitting: Certified Registered"

## 2021-05-20 ENCOUNTER — Ambulatory Visit
Admission: RE | Admit: 2021-05-20 | Discharge: 2021-05-20 | Disposition: A | Discharge status: Home / Self Care | Payer: Medicare Other | Attending: Internal Medicine | Admitting: Internal Medicine

## 2021-05-20 ENCOUNTER — Encounter
Admission: RE | Disposition: A | Discharge status: Home / Self Care | Payer: Self-pay | Source: Home / Self Care | Attending: Internal Medicine

## 2021-05-20 DIAGNOSIS — Z85828 Personal history of other malignant neoplasm of skin: Secondary | ICD-10-CM | POA: Insufficient documentation

## 2021-05-20 DIAGNOSIS — I480 Paroxysmal atrial fibrillation: Secondary | ICD-10-CM | POA: Diagnosis present

## 2021-05-20 DIAGNOSIS — I10 Essential (primary) hypertension: Secondary | ICD-10-CM | POA: Diagnosis not present

## 2021-05-20 DIAGNOSIS — Z79899 Other long term (current) drug therapy: Secondary | ICD-10-CM | POA: Insufficient documentation

## 2021-05-20 DIAGNOSIS — Z96651 Presence of right artificial knee joint: Secondary | ICD-10-CM | POA: Diagnosis not present

## 2021-05-20 DIAGNOSIS — Z7901 Long term (current) use of anticoagulants: Secondary | ICD-10-CM | POA: Insufficient documentation

## 2021-05-20 DIAGNOSIS — Z79890 Hormone replacement therapy (postmenopausal): Secondary | ICD-10-CM | POA: Diagnosis not present

## 2021-05-20 DIAGNOSIS — Z96611 Presence of right artificial shoulder joint: Secondary | ICD-10-CM | POA: Insufficient documentation

## 2021-05-20 HISTORY — PX: CARDIOVERSION: SHX1299

## 2021-05-20 HISTORY — DX: Hematuria, unspecified: R31.9

## 2021-05-20 HISTORY — DX: Calculus of kidney: N20.0

## 2021-05-20 SURGERY — CARDIOVERSION
Anesthesia: General

## 2021-05-20 MED ORDER — SODIUM CHLORIDE 0.9 % IV SOLN: INTRAVENOUS | Status: DC

## 2021-05-20 MED ORDER — PROPOFOL 10 MG/ML IV BOLUS
INTRAVENOUS | Status: DC | PRN
  Administered 2021-05-20: 50 mg via INTRAVENOUS
  Administered 2021-05-20: 30 mg via INTRAVENOUS

## 2021-05-20 NOTE — Anesthesia Preprocedure Evaluation (Signed)
Anesthesia Evaluation  Patient identified by MRN, date of birth, ID band Patient awake    Reviewed: Allergy & Precautions, NPO status , Patient's Chart, lab work & pertinent test results  History of Anesthesia Complications (+) PONV and history of anesthetic complications  Airway Mallampati: III  TM Distance: >3 FB Neck ROM: Full    Dental no notable dental hx.    Pulmonary neg pulmonary ROS, neg sleep apnea, neg COPD,    breath sounds clear to auscultation- rhonchi (-) wheezing      Cardiovascular hypertension, Pt. on medications +CHF  (-) CAD, (-) Past MI, (-) Cardiac Stents and (-) CABG + dysrhythmias Atrial Fibrillation  Rhythm:Regular Rate:Normal - Systolic murmurs and - Diastolic murmurs    Neuro/Psych neg Seizures negative neurological ROS  negative psych ROS   GI/Hepatic Neg liver ROS, GERD  ,  Endo/Other  negative endocrine ROSneg diabetes  Renal/GU Renal disease: hx of nephrolithiasis.     Musculoskeletal  (+) Arthritis ,   Abdominal (+) + obese,   Peds  Hematology negative hematology ROS (+)   Anesthesia Other Findings Past Medical History: No date: Allergic rhinitis No date: Arthritis 10/03/2019: Basal cell carcinoma     Comment:  Right prox. pretibial below knee. Nodular pattern. Tx:               [facility] 04/21/2020: Basal cell carcinoma     Comment:  Right prox. mandible inf. to ear. Nodular pattern. 04/21/2020: Basal cell carcinoma     Comment:  Left infranasal. Nodular pattern. 04/2016: CHF (congestive heart failure) ([facility])     Comment:  developed 2 weeks after identifying AF No date: Complication of anesthesia No date: Does use hearing aid     Comment:  right ear 04/2016: Dysrhythmia     Comment:  a fib   08/02/2017: Fall at home, subsequent encounter No date: GERD (gastroesophageal reflux disease)     Comment:  occasionally has reflux/heartburn No date: Hematuria No date: History of pelvic  mass     Comment:  s/p resection, benign 06/13/2014: Hx of dysplastic nevus     Comment:  Right distal dorsum forearm near wrist. Moderate atypia,              extends to peripheral margin 02/01/2019: Hx of dysplastic nevus     Comment:  Right top of deltoid. Moderate atypia, limited margins               free 02/01/2019: Hx of dysplastic nevus     Comment:  Left mid to upper back 5.0cm lateral to spine. Moderate               atypia, close to margin No date: Hypercholesteremia No date: Hypertension No date: Kidney stones No date: Mitral regurgitation     Comment:  noted echo 04/2016  No date: Osteoporosis     Comment:  per pt report on Medical Screening Form  No date: PONV (postoperative nausea and vomiting) No date: Pyelonephritis     Comment:  remote hx  No date: Shortness of breath dyspnea 04/02/2020: Squamous cell carcinoma of skin     Comment:  Left dorsum lat. wrist. SCCis, hypertophic. [place] 2007: Varicella zoster   Reproductive/Obstetrics                             Anesthesia Physical Anesthesia Plan  ASA: III  Anesthesia Plan: General   Post-op Pain Management:  Induction: Intravenous  PONV Risk Score and Plan: 3 and Propofol infusion  Airway Management Planned: Natural Airway  Additional Equipment:   Intra-op Plan:   Post-operative Plan:   Informed Consent: I have reviewed the patients History and Physical, chart, labs and discussed the procedure including the risks, benefits and alternatives for the proposed anesthesia with the patient or authorized representative who has indicated his/her understanding and acceptance.     Dental advisory given  Plan Discussed with: CRNA and Anesthesiologist  Anesthesia Plan Comments:         Anesthesia Quick Evaluation

## 2021-05-20 NOTE — Anesthesia Procedure Notes (Signed)
Procedure Name: MAC Date/Time: 05/20/2021 7:39 AM Performed by: Jerrye Noble, CRNA Pre-anesthesia Checklist: Patient identified, Emergency Drugs available, Suction available and Patient being monitored Patient Re-evaluated:Patient Re-evaluated prior to induction Oxygen Delivery Method: Nasal cannula

## 2021-05-20 NOTE — CV Procedure (Addendum)
Electrical Cardioversion Procedure Note Tina Howard [id number] Jul 19, 1933  Procedure: Electrical Cardioversion Indications:  Paroxysmal non valvular atrial fibrillation  Procedure Details Consent: Risks of procedure as well as the alternatives and risks of each were explained to the (patient/caregiver).  Consent for procedure obtained. Time Out: Verified patient identification, verified procedure, site/side was marked, verified correct patient position, special equipment/implants available, medications/allergies/relevent history reviewed, required imaging and test results available.  Performed  Patient placed on cardiac monitor, pulse oximetry, supplemental oxygen as necessary.  Sedation given: Propofol and versed as per anesthesia  Pacer pads placed anterior and posterior chest.  Cardioverted 3 time(s).  And not Cardioverted at 200J.  Evaluation Findings: Post procedure EKG shows: Atrial Fibrillation Complications: None Patient did tolerate procedure well.   Tina Howard M.D. [facility] 05/20/2021, 7:54 AM

## 2021-05-20 NOTE — Discharge Instructions (Signed)
Electrical Cardioversion Electrical cardioversion is the delivery of a jolt of electricity to restore a normal rhythm to the heart. A rhythm that is too fast or is not regular keeps the heart from pumping well. In this procedure, sticky patches or metal paddles are placed on the chest to deliver electricity to the heart from a device. This procedure may be done in an emergency if:  There is low or no blood pressure as a result of the heart rhythm.  Normal rhythm must be restored as fast as possible to protect the brain and heart from further damage.  It may save a life. This may also be a scheduled procedure for irregular or fast heart rhythms that are not immediately life-threatening. Tell a health care provider about:  Any allergies you have.  All medicines you are taking, including vitamins, herbs, eye drops, creams, and over-the-counter medicines.  Any problems you or family members have had with anesthetic medicines.  Any blood disorders you have.  Any surgeries you have had.  Any medical conditions you have.  Whether you are pregnant or may be pregnant. What are the risks? Generally, this is a safe procedure. However, problems may occur, including:  Allergic reactions to medicines.  A blood clot that breaks free and travels to other parts of your body.  The possible return of an abnormal heart rhythm within hours or days after the procedure.  Your heart stopping (cardiac arrest). This is rare. What happens before the procedure? Medicines  Your health care provider may have you start taking: ? Blood-thinning medicines (anticoagulants) so your blood does not clot as easily. ? Medicines to help stabilize your heart rate and rhythm.  Ask your health care provider about: ? Changing or stopping your regular medicines. This is especially important if you are taking diabetes medicines or blood thinners. ? Taking medicines such as aspirin and ibuprofen. These medicines can  thin your blood. Do not take these medicines unless your health care provider tells you to take them. ? Taking over-the-counter medicines, vitamins, herbs, and supplements. General instructions  Follow instructions from your health care provider about eating or drinking restrictions.  Plan to have someone take you home from the hospital or clinic.  If you will be going home right after the procedure, plan to have someone with you for 24 hours.  Ask your health care provider what steps will be taken to help prevent infection. These may include washing your skin with a germ-killing soap. What happens during the procedure?  An IV will be inserted into one of your veins.  Sticky patches (electrodes) or metal paddles may be placed on your chest.  You will be given a medicine to help you relax (sedative).  An electrical shock will be delivered. The procedure may vary among health care providers and hospitals.   What can I expect after the procedure?  Your blood pressure, heart rate, breathing rate, and blood oxygen level will be monitored until you leave the hospital or clinic.  Your heart rhythm will be watched to make sure it does not change.  You may have some redness on the skin where the shocks were given. Follow these instructions at home:  Do not drive for 24 hours if you were given a sedative during your procedure.  Take over-the-counter and prescription medicines only as told by your health care provider.  Ask your health care provider how to check your pulse. Check it often.  Rest for 48 hours after the procedure   or as told by your health care provider.  Avoid or limit your caffeine use as told by your health care provider.  Keep all follow-up visits as told by your health care provider. This is important. Contact a health care provider if:  You feel like your heart is beating too quickly or your pulse is not regular.  You have a serious muscle cramp that does not go  away. Get help right away if:  You have discomfort in your chest.  You are dizzy or you feel faint.  You have trouble breathing or you are short of breath.  Your speech is slurred.  You have trouble moving an arm or leg on one side of your body.  Your fingers or toes turn cold or blue. Summary  Electrical cardioversion is the delivery of a jolt of electricity to restore a normal rhythm to the heart.  This procedure may be done right away in an emergency or may be a scheduled procedure if the condition is not an emergency.  Generally, this is a safe procedure.  After the procedure, check your pulse often as told by your health care provider. This information is not intended to replace advice given to you by your health care provider. Make sure you discuss any questions you have with your health care provider. Document Revised: 07/16/2019 Document Reviewed: 07/16/2019 Elsevier Patient Education  2021 [employer]vier Inc[employer]  

## 2021-05-20 NOTE — Anesthesia Postprocedure Evaluation (Signed)
Anesthesia Post Note  Patient: Tina Howard  Procedure(s) Performed: CARDIOVERSION (N/A )  Patient location during evaluation: Specials Recovery Anesthesia Type: General Level of consciousness: awake and alert and oriented Pain management: pain level controlled Vital Signs Assessment: post-procedure vital signs reviewed and stable Respiratory status: spontaneous breathing, nonlabored ventilation and respiratory function stable Cardiovascular status: blood pressure returned to baseline and stable Postop Assessment: no signs of nausea or vomiting Anesthetic complications: no   No complications documented.   Last Vitals:  Vitals:   05/20/21 0759 05/20/21 0815  BP: 103/64 120/70  Pulse: 61 62  Resp: 19 14  Temp:    SpO2: 99% 98%    Last Pain:  Vitals:   05/20/21 0719  TempSrc: Oral  PainSc: 0-No pain                 Niall Illes

## 2021-05-20 NOTE — Transfer of Care (Signed)
Immediate Anesthesia Transfer of Care Note  Patient: Tina Howard  Procedure(s) Performed: CARDIOVERSION (N/A )  Patient Location: PACU and Special recoveries  Anesthesia Type:General  Level of Consciousness: drowsy and patient cooperative  Airway & Oxygen Therapy: Patient Spontanous Breathing and Patient connected to nasal cannula oxygen  Post-op Assessment: Report given to RN and Post -op Vital signs reviewed and stable  Post vital signs: Reviewed and stable  Last Vitals:  Vitals Value Taken Time  BP 114/61 05/20/21 0750  Temp    Pulse 63 05/20/21 0752  Resp 14 05/20/21 0752  SpO2 96 % 05/20/21 0752    Last Pain:  Vitals:   05/20/21 0719  TempSrc: Oral  PainSc: 0-No pain         Complications: No complications documented.

## 2021-06-03 ENCOUNTER — Ambulatory Visit (INDEPENDENT_AMBULATORY_CARE_PROVIDER_SITE_OTHER): Payer: Medicare Other | Admitting: Dermatology

## 2021-06-03 ENCOUNTER — Other Ambulatory Visit: Payer: Self-pay

## 2021-06-03 DIAGNOSIS — L578 Other skin changes due to chronic exposure to nonionizing radiation: Secondary | ICD-10-CM | POA: Diagnosis not present

## 2021-06-03 DIAGNOSIS — Z85828 Personal history of other malignant neoplasm of skin: Secondary | ICD-10-CM | POA: Diagnosis not present

## 2021-06-03 DIAGNOSIS — C44629 Squamous cell carcinoma of skin of left upper limb, including shoulder: Secondary | ICD-10-CM | POA: Diagnosis not present

## 2021-06-03 DIAGNOSIS — D489 Neoplasm of uncertain behavior, unspecified: Secondary | ICD-10-CM

## 2021-06-03 NOTE — Progress Notes (Signed)
   Follow-Up Visit   Subjective  Tina Howard is a 87 y.o. female who presents for the following: OTHER (Patient here today spot left forearm near wrist. Patient noticed about a month ago. Patient states it hurts when she touches. She has a history of skin cancers. ).  The following portions of the chart were reviewed this encounter and updated as appropriate:  Tobacco  Allergies  Meds  Problems  Med Hx  Surg Hx  Fam Hx       Objective  Well appearing patient in no apparent distress; mood and affect are within normal limits.  A focused examination was performed including left forearm . Relevant physical exam findings are noted in the Assessment and Plan.  left forearm 0.6 cm firm pink papule      Assessment & Plan  Neoplasm of uncertain behavior left forearm  Skin / nail biopsy Type of biopsy: tangential   Informed consent: discussed and consent obtained   Timeout: patient name, date of birth, surgical site, and procedure verified   Patient was prepped and draped in usual sterile fashion: Area prepped with isopropyl alcohol. Anesthesia: the lesion was anesthetized in a standard fashion   Anesthetic:  1% lidocaine w/ epinephrine 1-100,000 buffered w/ 8.4% NaHCO3 Instrument used: flexible razor blade   Hemostasis achieved with: aluminum chloride   Outcome: patient tolerated procedure well   Post-procedure details: wound care instructions given   Additional details:  Mupirocin and a bandage applied  Specimen 1 - Surgical pathology Differential Diagnosis: r/o scc   Check Margins: No 0.6 cm firm pink papule  R/o scc   Patient would like to wait until biopsy result to treat  Actinic Damage - chronic, secondary to cumulative UV radiation exposure/sun exposure over time - diffuse scaly erythematous macules with underlying dyspigmentation - Recommend daily broad spectrum sunscreen SPF 30+ to sun-exposed areas, reapply every 2 hours as needed.  - Recommend staying in  the shade or wearing long sleeves, sun glasses (UVA+UVB protection) and wide brim hats (4-inch brim around the entire circumference of the hat). - Call for new or changing lesions. - Recommend taking Heliocare sun protection supplement daily in sunny weather for additional sun protection. For maximum protection on the sunniest days, you can take up to 2 capsules of regular Heliocare OR take 1 capsule of Heliocare Ultra. For prolonged exposure (such as a full day in the sun), you can repeat your dose of the supplement 4 hours after your first dose.   Return for FBSE around 3 months.  I, Ruthell Rummage, CMA, am acting as scribe for Forest Gleason, MD. Documentation: I have reviewed the above documentation for accuracy and completeness, and I agree with the above.  Forest Gleason, MD

## 2021-06-03 NOTE — Patient Instructions (Addendum)
Biopsy Wound Care Instructions  1. Leave the original bandage on for 24 hours if possible.  If the bandage becomes soaked or soiled before that time, it is OK to remove it and examine the wound.  A small amount of post-operative bleeding is normal.  If excessive bleeding occurs, remove the bandage, place gauze over the site and apply continuous pressure (no peeking) over the area for 30 minutes. If this does not work, please call our clinic as soon as possible or page your doctor if it is after hours.   2. Once a day, cleanse the wound with soap and water. It is fine to shower. If a thick crust develops you may use a Q-tip dipped into dilute hydrogen peroxide (mix 1:1 with water) to dissolve it.  Hydrogen peroxide can slow the healing process, so use it only as needed.    3. After washing, apply petroleum jelly (Vaseline) or an antibiotic ointment if your doctor prescribed one for you, followed by a bandage.    4. For best healing, the wound should be covered with a layer of ointment at all times. If you are not able to keep the area covered with a bandage to hold the ointment in place, this may mean re-applying the ointment several times a day.  Continue this wound care until the wound has healed and is no longer open.   Itching and mild discomfort is normal during the healing process. However, if you develop pain or severe itching, please call our office.   If you have any discomfort, you can take Tylenol (acetaminophen) or ibuprofen as directed on the bottle. (Please do not take these if you have an allergy to them or cannot take them for another reason).  Some redness, tenderness and white or yellow material in the wound is normal healing.  If the area becomes very sore and red, or develops a thick yellow-green material (pus), it may be infected; please notify us.    If you have stitches, return to clinic as directed to have the stitches removed. You will continue wound care for 2-3 days after  the stitches are removed.   Wound healing continues for up to one year following surgery. It is not unusual to experience pain in the scar from time to time during the interval.  If the pain becomes severe or the scar thickens, you should notify the office.    A slight amount of redness in a scar is expected for the first six months.  After six months, the redness will fade and the scar will soften and fade.  The color difference becomes less noticeable with time.  If there are any problems, return for a post-op surgery check at your earliest convenience.  To improve the appearance of the scar, you can use silicone scar gel, cream, or sheets (such as Mederma or Serica) every night for up to one year. These are available over the counter (without a prescription).  Please call our office at [phone] for any questions or concerns.    If you have any questions or concerns for your doctor, please call our main line at [phone] and press option 4 to reach your doctor's medical assistant. If no one answers, please leave a voicemail as directed and we will return your call as soon as possible. Messages left after 4 pm will be answered the following business day.   You may also send [place] a message via [place]. We typically respond to MyChart messages within 1-2  business days.  For prescription refills, please ask your pharmacy to contact our office. Our fax number is [phone].  If you have an urgent issue when the clinic is closed that cannot wait until the next business day, you can page your doctor at the number below.    Please note that while we do our best to be available for urgent issues outside of office hours, we are not available 24/7.   If you have an urgent issue and are unable to reach [place], you may choose to seek medical care at your doctor's office, retail clinic, urgent care center, or emergency room.  If you have a medical emergency, please immediately call 911 or go to the  emergency department.  Pager Numbers  - Dr. Nehemiah Massed: [phone]  - Dr. Laurence Ferrari: [phone]  - Dr. Nicole Kindred: [phone]  In the event of inclement weather, please call our main line at [phone] for an update on the status of any delays or closures.  Dermatology Medication Tips: Please keep the boxes that topical medications come in in order to help keep track of the instructions about where and how to use these. Pharmacies typically print the medication instructions only on the boxes and not directly on the medication tubes.   If your medication is too expensive, please contact our office at [phone] option 4 or send [place] a message through [place].   We are unable to tell what your co-pay for medications will be in advance as this is different depending on your insurance coverage. However, we may be able to find a substitute medication at lower cost or fill out paperwork to get insurance to cover a needed medication.   If a prior authorization is required to get your medication covered by your insurance company, please allow [place] 1-2 business days to complete this process.  Drug prices often vary depending on where the prescription is filled and some pharmacies may offer cheaper prices.  The website www.goodrx.com contains coupons for medications through different pharmacies. The prices here do not account for what the cost may be with help from insurance (it may be cheaper with your insurance), but the website can give you the price if you did not use any insurance.  - You can print the associated coupon and take it with your prescription to the pharmacy.  - You may also stop by our office during regular business hours and pick up a GoodRx coupon card.  - If you need your prescription sent electronically to a different pharmacy, notify our office through [facility] or by phone at [phone] option 4.  Recommend daily broad spectrum sunscreen SPF 30+ to sun-exposed areas,  reapply every 2 hours as needed. Call for new or changing lesions.  Staying in the shade or wearing long sleeves, sun glasses (UVA+UVB protection) and wide brim hats (4-inch brim around the entire circumference of the hat) are also recommended for sun protection.

## 2021-06-11 ENCOUNTER — Telehealth: Payer: Self-pay

## 2021-06-11 NOTE — Telephone Encounter (Signed)
-----   Message from Alfonso Patten, MD sent at 06/11/2021  1:30 PM EDT ----- Skin , left forearm WELL DIFFERENTIATED SQUAMOUS CELL CARCINOMA, PERIPHERAL AND DEEP MARGINS INVOLVED --> discussed ED&C vs excision. Pt prefers ED&C  MAs please schedule - should have some 8 am slots open. Thank you!

## 2021-06-11 NOTE — Telephone Encounter (Signed)
Discussed biopsy results with pt, return to the office for [facility] discussed

## 2021-06-16 ENCOUNTER — Encounter: Payer: Self-pay | Admitting: Dermatology

## 2021-07-09 ENCOUNTER — Emergency Department
Admission: EM | Admit: 2021-07-09 | Discharge: 2021-07-09 | Disposition: A | Discharge status: Home / Self Care | Payer: Medicare Other | Attending: Emergency Medicine | Admitting: Emergency Medicine

## 2021-07-09 ENCOUNTER — Other Ambulatory Visit: Payer: Self-pay

## 2021-07-09 ENCOUNTER — Encounter: Payer: Self-pay | Admitting: Pharmacy Technician

## 2021-07-09 DIAGNOSIS — I110 Hypertensive heart disease with heart failure: Secondary | ICD-10-CM | POA: Diagnosis not present

## 2021-07-09 DIAGNOSIS — I4891 Unspecified atrial fibrillation: Secondary | ICD-10-CM | POA: Insufficient documentation

## 2021-07-09 DIAGNOSIS — Z7901 Long term (current) use of anticoagulants: Secondary | ICD-10-CM | POA: Insufficient documentation

## 2021-07-09 DIAGNOSIS — R040 Epistaxis: Secondary | ICD-10-CM

## 2021-07-09 DIAGNOSIS — Z85828 Personal history of other malignant neoplasm of skin: Secondary | ICD-10-CM | POA: Insufficient documentation

## 2021-07-09 DIAGNOSIS — Z96653 Presence of artificial knee joint, bilateral: Secondary | ICD-10-CM | POA: Diagnosis not present

## 2021-07-09 DIAGNOSIS — I509 Heart failure, unspecified: Secondary | ICD-10-CM | POA: Insufficient documentation

## 2021-07-09 DIAGNOSIS — Z79899 Other long term (current) drug therapy: Secondary | ICD-10-CM | POA: Insufficient documentation

## 2021-07-09 DIAGNOSIS — Z96611 Presence of right artificial shoulder joint: Secondary | ICD-10-CM | POA: Diagnosis not present

## 2021-07-09 LAB — CBC
HCT: 34.3 % — ABNORMAL LOW (ref 36.0–46.0)
Hemoglobin: 11.1 g/dL — ABNORMAL LOW (ref 12.0–15.0)
MCH: 27.2 pg (ref 26.0–34.0)
MCHC: 32.4 g/dL (ref 30.0–36.0)
MCV: 84.1 fL (ref 80.0–100.0)
Platelets: 207 10*3/uL (ref 150–400)
RBC: 4.08 MIL/uL (ref 3.87–5.11)
RDW: 15.1 % (ref 11.5–15.5)
WBC: 6.6 10*3/uL (ref 4.0–10.5)
nRBC: 0 % (ref 0.0–0.2)

## 2021-07-09 LAB — COMPREHENSIVE METABOLIC PANEL
ALT: 23 U/L (ref 0–44)
AST: 22 U/L (ref 15–41)
Albumin: 3.6 g/dL (ref 3.5–5.0)
Alkaline Phosphatase: 48 U/L (ref 38–126)
Anion gap: 6 (ref 5–15)
BUN: 20 mg/dL (ref 8–23)
CO2: 29 mmol/L (ref 22–32)
Calcium: 8.7 mg/dL — ABNORMAL LOW (ref 8.9–10.3)
Chloride: 104 mmol/L (ref 98–111)
Creatinine, Ser: 0.54 mg/dL (ref 0.44–1.00)
GFR, Estimated: >60 mL/min (ref >60)
Glucose, Bld: 131 mg/dL — ABNORMAL HIGH (ref 70–99)
Potassium: 3.4 mmol/L — ABNORMAL LOW (ref 3.5–5.1)
Sodium: 139 mmol/L (ref 135–145)
Total Bilirubin: 0.9 mg/dL (ref 0.3–1.2)
Total Protein: 6.3 g/dL — ABNORMAL LOW (ref 6.5–8.1)

## 2021-07-09 MED ORDER — OXYMETAZOLINE HCL 0.05 % NA SOLN
3.0000 | Freq: Once | NASAL | Status: AC
  Administered 2021-07-09: 3 via NASAL

## 2021-07-09 NOTE — Discharge Instructions (Addendum)
Please take your blood pressure medications as soon as you get home. As we discussed please use Afrin 3 sprays in each nostril if any bleeding recurs and then place the nasal clamp on your nose for 15 minutes.  Do not remove the nasal clamp for 15 minutes.  After 15 minutes you may remove the nasal clamp if you continue to have bleeding please return to the emergency department.  Otherwise please follow-up with ENT within the next several days for recheck/reevaluation.

## 2021-07-09 NOTE — ED Notes (Addendum)
Pt assisted to the bathroom. Pt nose clamp removed by MD. Pt face cleaned of dried blood at this time

## 2021-07-09 NOTE — ED Notes (Signed)
Patient Alert and oriented to baseline. Stable and ambulatory to baseline. Patient verbalized understanding of the discharge instructions.  Patient belongings were taken by the patient.   

## 2021-07-09 NOTE — ED Provider Notes (Signed)
[facility] Emergency Department Provider Note  Time seen: 9:58 AM  I have reviewed the triage vital signs and the nursing notes.   HISTORY  Chief Complaint Epistaxis   HPI Tina Howard is a 87 y.o. female with a past medical history of CHF, gastric reflux, hypertension, hyperlipidemia, atrial fibrillation on Xarelto presents to the emergency department for nosebleed.  According to the patient every morning when she wakes up she is congested.  States she blew her nose this morning and is started a nosebleed.  Patient states he has not had a nosebleed in approximately 25 years.  Patient states she is spitting out clots of blood at times.  Denies any other symptoms today.   Past Medical History:  Diagnosis Date   Allergic rhinitis    Arthritis    Basal cell carcinoma 10/03/2019   Right prox. pretibial below knee. Nodular pattern. Tx: EDC   Basal cell carcinoma 04/21/2020   Right prox. mandible inf. to ear. Nodular pattern.   Basal cell carcinoma 04/21/2020   Left infranasal. Nodular pattern.   CHF (congestive heart failure) ([place]) 04/2016   developed 2 weeks after identifying AF   Complication of anesthesia    Does use hearing aid    right ear   Dysrhythmia 04/2016   a fib     Fall at home, subsequent encounter 08/02/2017   GERD (gastroesophageal reflux disease)    occasionally has reflux/heartburn   Hematuria    History of pelvic mass    s/p resection, benign   Hx of dysplastic nevus 06/13/2014   Right distal dorsum forearm near wrist. Moderate atypia, extends to peripheral margin   Hx of dysplastic nevus 02/01/2019   Right top of deltoid. Moderate atypia, limited margins free   Hx of dysplastic nevus 02/01/2019   Left mid to upper back 5.0cm lateral to spine. Moderate atypia, close to margin   Hypercholesteremia    Hypertension    Kidney stones    Mitral regurgitation    noted echo 04/2016    Osteoporosis    per pt report on Medical Screening  Form    PONV (postoperative nausea and vomiting)    Pyelonephritis    remote hx    Shortness of breath dyspnea    Squamous cell carcinoma of skin 04/02/2020   Left dorsum lat. wrist. SCCis, hypertophic. 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   Rotator cuff tendinitis, right 06/05/2018   History of total knee arthroplasty 03/02/2018   Osteoarthritis of hip 03/02/2018   HLD (hyperlipidemia) 12/29/2017   Cardiac murmur 12/29/2017   Mitral regurgitation 12/29/2017   Osteopenia 12/01/2017   First degree AV block 08/02/2017   Paroxysmal A-fib ([place]) 05/26/2016   Essential hypertension 05/26/2016   Hypokalemia 05/26/2016   Seasonal allergies 05/06/2016   Obesity (BMI 30-39.9) 11/13/2014   Back pain, chronic 05/08/2014   Prediabetes 05/08/2014   Screening for breast cancer 06/27/2012   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 Sandra Cockayne   CYSTOSCOPY     ELECTROPHYSIOLOGIC STUDY N/A 06/15/2016   Procedure: CARDIOVERSION;  Surgeon: [name], MD;  Location: [facility] ORS;  Service: Cardiovascular;  Laterality: N/A;   EYE SURGERY     bilateral cataract   FOOT SURGERY      HERNIA REPAIR  2013   inguinal   JOINT REPLACEMENT  [id number]   bilateral knee replacements   KNEE ARTHROSCOPY     right   pelvic mass removed 3x7 x 11 1990s or early 2000s     s/p hysterectomy    RECTOCELE REPAIR     REVERSE SHOULDER ARTHROPLASTY Right 06/27/2018   Procedure: REVERSE SHOULDER ARTHROPLASTY;  Surgeon: Corky Mull, MD;  Location: [facility] ORS;  Service: Orthopedics;  Laterality: Right;   TOTAL KNEE ARTHROPLASTY     left   VAGINAL HYSTERECTOMY  1970   benign reasons    Prior to Admission medications   Medication Sig Start Date End Date Taking? Authorizing Provider  acetaminophen (TYLENOL) 500 MG tablet Take 500-1,000 mg by mouth 2 (two) times daily as needed for moderate pain or headache.    [provider]  amiodarone (PACERONE) 200 MG tablet Take 1 tablet by mouth 2 (two) times daily. 06/02/21 06/02/22  [provider]  amoxicillin (AMOXIL) 500 MG capsule Take 2,000 mg by mouth See admin instructions. Take 2000 mg 1 hour prior to dental work    [provider]  Calcium Carb-Cholecalciferol (CALCIUM 600 + D PO) Take 1 tablet by mouth daily.    [provider]  Carboxymethylcellul-Glycerin (LUBRICATING EYE DROPS OP) Place 1 drop into both eyes daily as needed (dry eyes).    [provider]  cholecalciferol (VITAMIN D3) 25 MCG (1000 UNIT) tablet Take 1,000 Units by mouth daily.    [provider]  diclofenac sodium (VOLTAREN) 1 % GEL Apply 1 application topically as needed (foot pain). Patient taking differently: Apply 1 application topically 4 (four) times daily as needed (foot pain). 05/15/19   Edrick Kins, DPM  diltiazem (TIAZAC) 120 MG 24 hr capsule Take 120 mg by mouth every evening. 10/10/17   [provider]  docusate calcium (SURFAK) 240 MG capsule Take 240 mg by mouth at bedtime.     [provider]  estradiol (ESTRACE) 0.1 MG/GM vaginal cream Place 0.5 g vaginally 2 (two) times a week. Place 0.5g  nightly for two weeks then twice a week after Patient not taking: No sig reported 03/19/21   Jaquita Folds, MD  furosemide (LASIX) 40 MG tablet Take 40 mg by mouth daily.  07/25/17   [provider]  hydrALAZINE (APRESOLINE) 25 MG tablet Take 1 tablet (25 mg total) by mouth 2 (two) times daily. 12/04/20 12/04/21  McLean-Scocuzza, Nino Glow, MD  losartan (COZAAR) 100 MG tablet Take 1 tablet (100 mg total) by mouth daily. 03/24/21   McLean-Scocuzza, Nino Glow, MD  potassium chloride SA (KLOR-CON) 20 MEQ tablet Take 1 tablet (20 mEq total) by mouth 3 (three) times daily. Patient taking differently: Take 20 mEq by mouth 2 (two) times daily. 03/24/21   McLean-Scocuzza, Nino Glow, MD  pravastatin (PRAVACHOL) 40 MG tablet TAKE 1 TABLET BY MOUTH IN  THE EVENING Patient taking differently: Take 40 mg by mouth every evening. 02/05/21   McLean-Scocuzza, Nino Glow, MD  rivaroxaban (XARELTO) 20 MG TABS tablet Take 1 tablet (20 mg total) by mouth daily with supper. 03/06/18   Burnard Hawthorne, FNP  timolol (TIMOPTIC) 0.5 % ophthalmic solution Place 1 drop into both eyes daily. 04/10/19   [provider]  vitamin B-12 (CYANOCOBALAMIN) 1000 MCG tablet Take 1,000 mcg by mouth daily.    [provider]    Allergies  Allergen Reactions   Amlodipine Swelling    Ankle swelling   Azo Urinary Tract Support Other (See Comments)    Skin turned orange   Erythromycin Nausea And Vomiting    Family History  Problem Relation Age of Onset   Emphysema Mother    Heart disease Father    Early death Father        died 9 y.o MI    Breast cancer Maternal Aunt 80   Kidney Stones Son     Social History Social History   Tobacco Use   Smoking status: Never   Smokeless tobacco: Never  Vaping Use   Vaping Use: Never used  Substance Use Topics   Alcohol use: Yes    Alcohol/week: 2.0 standard drinks    Types: 2 Glasses of wine per week    Comment: occassional   Drug use: No    Review of  Systems Constitutional: Negative for fever. ENT: Positive for nosebleed appears to be from the right nostril. Cardiovascular: Negative for chest pain. Respiratory: Negative for shortness of breath. Gastrointestinal: Negative for abdominal pain Musculoskeletal: Negative for musculoskeletal complaints Neurological: Negative for headache All other ROS negative  ____________________________________________   PHYSICAL EXAM:  VITAL SIGNS: ED Triage Vitals  Enc Vitals Group     BP 07/09/21 0922 (!) 204/112     Pulse Rate 07/09/21 0922 74     Resp 07/09/21 0922 18     Temp 07/09/21 0922 98 F (36.7 C)     Temp Source 07/09/21 0922 Oral     SpO2 07/09/21 0922 99 %     Weight --      Height --      Head Circumference --      Peak Flow --      Pain Score 07/09/21 0927 0     Pain Loc --      Pain Edu? --      Excl. in [place]? --    Constitutional: Alert and oriented. Well appearing and in no distress. Eyes: Normal exam ENT      Head: Normocephalic and atraumatic.      Nose: Patient with continued moderate epistaxis appears to be coming from the right nostril only.  Occasionally spitting out clots.      Mouth/Throat: Mucous membranes are moist. Cardiovascular: Normal rate, regular rhythm. No murmurs, rubs, or gallops. Respiratory: Normal respiratory effort without tachypnea nor retractions. Breath sounds are clear  Gastrointestinal: Soft and nontender. No distention.   Musculoskeletal: Nontender with normal range of motion in all extremities.  Neurologic:  Normal speech and language. No gross focal neurologic deficits  Skin:  Skin is warm, dry and intact.  Psychiatric: Mood and affect are normal.     INITIAL IMPRESSION / ASSESSMENT AND PLAN / ED COURSE  Pertinent labs & imaging results that were available during my care of the patient were reviewed by me and considered in  my medical decision making (see chart for details).   Patient presents emergency department for epistaxis  since this morning.  States she blew her nose this morning as she normally does and began with a nosebleed that has not stopped.  Moderate epistaxis on arrival coming from the right nostril.  Unable to see any obvious anterior septal bleed however there is a significant amount of blood which makes evaluation somewhat difficult.  We will use Afrin and clamp the patient's nose and reassess.  If this is not successful patient may require nasal packing.  Patient's blood pressure is decreasing.  After Afrin and nasal clamping patient is now hemostatic.  I have removed the clip.  No evidence of any anterior septal lesion or source of bleed.  We will continue to closely monitor to ensure patient remains hemostatic.  Lungs patient remains hemostatic we will discharge with ENT follow-up as needed.  Patient and son agreeable.  Patient remains hemostatic approximately 1 hour after nasal clamp removal.  We will discharge patient home with Afrin and a nasal clamp if any bleeding were to happen discussed this with the patient as well as ENT follow-up.  Patient agreeable to plan of care.  I also discussed return precautions.  As well as not blowing her nose  DORALEE KOCAK was evaluated in Emergency Department on 07/09/2021 for the symptoms described in the history of present illness. She was evaluated in the context of the global COVID-19 pandemic, which necessitated consideration that the patient might be at risk for infection with the SARS-CoV-2 virus that causes COVID-19. Institutional protocols and algorithms that pertain to the evaluation of patients at risk for COVID-19 are in a state of rapid change based on information released by regulatory bodies including the CDC and federal and state organizations. These policies and algorithms were followed during the patient's care in the ED.  ____________________________________________   FINAL CLINICAL IMPRESSION(S) / ED DIAGNOSES  Epistaxis   Harvest Dark,  MD 07/09/21 1131

## 2021-07-09 NOTE — ED Notes (Addendum)
ENT cart at bedside. Afrin administered. Nose clamp applied.

## 2021-07-09 NOTE — ED Triage Notes (Signed)
Pt here with epistaxis onset this morning after blowing her nose. Pt on xarelto. Hypertensive at 200 on arrival. Has not had her blood pressure medications this morning. Pt applying pressure to control bleeding.

## 2021-07-20 ENCOUNTER — Telehealth: Payer: Self-pay | Admitting: Internal Medicine

## 2021-07-20 NOTE — Telephone Encounter (Signed)
Left a message to call back for triage. 

## 2021-07-20 NOTE — Telephone Encounter (Signed)
Noted  

## 2021-07-20 NOTE — Telephone Encounter (Signed)
Called and spoke to pt son, He states that Koy is at [facility] with her other son Mikki Santee. Pt is being evaluated for covid symptoms.

## 2021-07-20 NOTE — Telephone Encounter (Signed)
Pt called into access nurse complaining about a headache lasting longer than 2 days. Pt is taking tylenol but it is not helping. Pt was instructed to go to the ED. Pt has not gone at this time.

## 2021-07-20 NOTE — Telephone Encounter (Signed)
Patient is complaining of a really bad headache for 2 days. No medication is helping headache. No appointments available. Patient was sent to Santiago Glad at [facility].

## 2021-07-23 ENCOUNTER — Encounter: Payer: Self-pay | Admitting: Internal Medicine

## 2021-07-23 ENCOUNTER — Other Ambulatory Visit: Payer: Self-pay

## 2021-07-23 ENCOUNTER — Ambulatory Visit
Admission: RE | Admit: 2021-07-23 | Discharge: 2021-07-23 | Disposition: A | Discharge status: Home / Self Care | Payer: Medicare Other | Source: Ambulatory Visit | Attending: Internal Medicine | Admitting: Internal Medicine

## 2021-07-23 ENCOUNTER — Ambulatory Visit (INDEPENDENT_AMBULATORY_CARE_PROVIDER_SITE_OTHER): Payer: Medicare Other | Admitting: Internal Medicine

## 2021-07-23 VITALS — BP 144/88 | HR 69 | Temp 97.0°F | Ht 60.0 in | Wt 187.0 lb

## 2021-07-23 DIAGNOSIS — I480 Paroxysmal atrial fibrillation: Secondary | ICD-10-CM | POA: Diagnosis not present

## 2021-07-23 DIAGNOSIS — R600 Localized edema: Secondary | ICD-10-CM

## 2021-07-23 DIAGNOSIS — R519 Headache, unspecified: Secondary | ICD-10-CM | POA: Diagnosis present

## 2021-07-23 DIAGNOSIS — I10 Essential (primary) hypertension: Secondary | ICD-10-CM

## 2021-07-23 DIAGNOSIS — R040 Epistaxis: Secondary | ICD-10-CM

## 2021-07-23 DIAGNOSIS — N200 Calculus of kidney: Secondary | ICD-10-CM | POA: Diagnosis not present

## 2021-07-23 MED ORDER — DILTIAZEM HCL ER COATED BEADS 180 MG PO CP24: 180.0000 mg | ORAL_CAPSULE | Freq: Every day | ORAL | 3 refills | Status: AC

## 2021-07-23 MED ORDER — DILTIAZEM HCL ER COATED BEADS 180 MG PO CP24: 180.0000 mg | ORAL_CAPSULE | Freq: Every day | ORAL | 0 refills | Status: DC

## 2021-07-23 MED ORDER — HYDRALAZINE HCL 25 MG PO TABS: 25.0000 mg | ORAL_TABLET | Freq: Three times a day (TID) | ORAL | 3 refills | Status: AC

## 2021-07-23 MED ORDER — HYDRALAZINE HCL 25 MG PO TABS: 25.0000 mg | ORAL_TABLET | Freq: Three times a day (TID) | ORAL | 3 refills | Status: DC

## 2021-07-23 NOTE — Progress Notes (Signed)
Chief Complaint  Patient presents with   Hospitalization Follow-up   F/u  1. Htn uncontrolled and h/a Dr.K cards took her off dilt 120 cd thought was causing leg swelling per pt been on long term and not doing so hydralazine was taking 25 bid not td per cards losartan 100 mg qd and lasix 40 mg qd  Disc with Dr. Raliegh Ip today adding back dilt and increase to 180 CD today better BP control He will contact her for appt in 1 week  H/a 8/10 left sided see below seen [facility] for records per pt ct head done and negative was given tramadol 50 mg but not filled Rx   Echo 04/27/21 normal and stress test  H/a left sided 8/10 worse 07/17/21 left sided to base of scalp could not sleep due to h/a   2. Afib persistent failed DCCV 05/20/21 pending appt ablation 08/2021 Dr. Mylinda Latina in [place] was on flecaninde bid changed to amio 200 mg bid wondering if can cause h/a reviewed 1-10% chance of h/a   3. Nose bleeds with clots right nose est ent last seen yesterday dr Pryor Ochoa tried afrin x 5 days and seen yesterday given gentamycin  Ed visit 07/09/21  Contact [place] ent if continues Ok to use humidifier and nasal saline too  4. Foot md appt pending 07/24/21 eye md appt 10/08/21 Dr. Wallace Going  5. B/l leg edema new will do [place] legs  Review of Systems  Constitutional:  Negative for weight loss.  HENT:  Negative for hearing loss.   Eyes:  Negative for blurred vision.  Respiratory:  Negative for shortness of breath.   Cardiovascular:  Positive for leg swelling.  Gastrointestinal:  Negative for abdominal pain.  Musculoskeletal:  Negative for falls and joint pain.  Skin:  Negative for rash.  Neurological:  Positive for headaches.  Psychiatric/Behavioral:  Negative for memory loss.   Past Medical History:  Diagnosis Date   Allergic rhinitis    Arthritis    Basal cell carcinoma 10/03/2019   Right prox. pretibial below knee. Nodular pattern. Tx: EDC   Basal cell carcinoma 04/21/2020   Right  prox. mandible inf. to ear. Nodular pattern.   Basal cell carcinoma 04/21/2020   Left infranasal. Nodular pattern.   CHF (congestive heart failure) ([place]) 04/2016   developed 2 weeks after identifying AF   Complication of anesthesia    Does use hearing aid    right ear   Dysrhythmia 04/2016   a fib     Fall at home, subsequent encounter 08/02/2017   GERD (gastroesophageal reflux disease)    occasionally has reflux/heartburn   Hematuria    History of pelvic mass    s/p resection, benign   Hx of dysplastic nevus 06/13/2014   Right distal dorsum forearm near wrist. Moderate atypia, extends to peripheral margin   Hx of dysplastic nevus 02/01/2019   Right top of deltoid. Moderate atypia, limited margins free   Hx of dysplastic nevus 02/01/2019   Left mid to upper back 5.0cm lateral to spine. Moderate atypia, close to margin   Hypercholesteremia    Hypertension    Kidney stones    Mitral regurgitation    noted echo 04/2016    Osteoporosis    per pt report on Medical Screening Form    PONV (postoperative nausea and vomiting)    Pyelonephritis    remote hx    Shortness of breath dyspnea    Squamous cell carcinoma of skin 04/02/2020  Left dorsum lat. wrist. SCCis, hypertophic. EDC   Squamous cell carcinoma of skin 06/03/2021   left forearm   Varicella zoster 2007   Past Surgical History:  Procedure Laterality Date   BACK SURGERY  1984   lower back L5 had surgery   bladder prolapse repair     BREAST CYST EXCISION Left    1990s   CARDIOVERSION N/A 05/20/2021   Procedure: CARDIOVERSION;  Surgeon: [name], MD;  Location: [facility] ORS;  Service: Cardiovascular;  Laterality: N/A;   CATARACT EXTRACTION Bilateral 2014   Dr. Sandra Cockayne   CYSTOSCOPY     ELECTROPHYSIOLOGIC STUDY N/A 06/15/2016   Procedure: CARDIOVERSION;  Surgeon: [name], MD;  Location: [facility] ORS;  Service: Cardiovascular;  Laterality: N/A;   EYE SURGERY     bilateral cataract   FOOT SURGERY     HERNIA  REPAIR  2013   inguinal   JOINT REPLACEMENT  [id number]   bilateral knee replacements   KNEE ARTHROSCOPY     right   pelvic mass removed 3x7 x 11 1990s or early 2000s     s/p hysterectomy    RECTOCELE REPAIR     REVERSE SHOULDER ARTHROPLASTY Right 06/27/2018   Procedure: REVERSE SHOULDER ARTHROPLASTY;  Surgeon: Corky Mull, MD;  Location: [facility] ORS;  Service: Orthopedics;  Laterality: Right;   TOTAL KNEE ARTHROPLASTY     left   VAGINAL HYSTERECTOMY  1970   benign reasons   Family History  Problem Relation Age of Onset   Emphysema Mother    Heart disease Father    Early death Father        died 25 y.o MI    Breast cancer Maternal Aunt 80   Kidney Stones Son    Social History   Socioeconomic History   Marital status: Widowed    Spouse name: Not on file   Number of children: Not on file   Years of education: Not on file   Highest education level: Not on file  Occupational History   Not on file  Tobacco Use   Smoking status: Never   Smokeless tobacco: Never  Vaping Use   Vaping Use: Never used  Substance and Sexual Activity   Alcohol use: Yes    Alcohol/week: 2.0 standard drinks    Types: 2 Glasses of wine per week    Comment: occassional   Drug use: No   Sexual activity: Never  Other Topics Concern   Not on file  Social History Narrative   Widowed.   Very active within her community.   Enjoys singing, being active in her church, spending time with family.   Lives with son Clayborne Artist 09/23/20   Others son Hoover Browns (oldest [place] 25 y.o) 09/23/20   1 son in [place] (Bob 62 as of 09/23/20)   Tom 59 in [place] 09/23/20   Social Determinants of Health   Financial Resource Strain: Low Risk    Difficulty of Paying Living Expenses: Not hard at all  Food Insecurity: No Food Insecurity   Worried About Charity fundraiser in the Last Year: Never true   Arboriculturist in the Last Year: Never true  Transportation Needs: No Transportation Needs   Lack of Transportation (Medical):  No   Lack of Transportation (Non-Medical): No  Physical Activity: Not on file  Stress: No Stress Concern Present   Feeling of Stress : Not at all  Social Connections: Unknown   Frequency of Communication with Friends and  Family: More than three times a week   Frequency of Social Gatherings with Friends and Family: More than three times a week   Attends Religious Services: Not on file   Active Member of Clubs or Organizations: Not on file   Attends Archivist Meetings: Not on file   Marital Status: Widowed  Human resources officer Violence: Not At Risk   Fear of Current or Ex-Partner: No   Emotionally Abused: No   Physically Abused: No   Sexually Abused: No   Current Meds  Medication Sig   acetaminophen (TYLENOL) 500 MG tablet Take 500-1,000 mg by mouth 2 (two) times daily as needed for moderate pain or headache.   amiodarone (PACERONE) 200 MG tablet Take 1 tablet by mouth 2 (two) times daily.   Calcium Carb-Cholecalciferol (CALCIUM 600 + D PO) Take 1 tablet by mouth daily.   cholecalciferol (VITAMIN D3) 25 MCG (1000 UNIT) tablet Take 1,000 Units by mouth daily.   diclofenac sodium (VOLTAREN) 1 % GEL Apply 1 application topically as needed (foot pain). (Patient taking differently: Apply 1 application topically 4 (four) times daily as needed (foot pain).)   diltiazem (CARDIZEM CD) 180 MG 24 hr capsule Take 1 capsule (180 mg total) by mouth daily.   docusate calcium (SURFAK) 240 MG capsule Take 240 mg by mouth at bedtime.    furosemide (LASIX) 40 MG tablet Take 40 mg by mouth daily.    losartan (COZAAR) 100 MG tablet Take 1 tablet (100 mg total) by mouth daily.   potassium chloride SA (KLOR-CON) 20 MEQ tablet Take 1 tablet (20 mEq total) by mouth 3 (three) times daily. (Patient taking differently: Take 20 mEq by mouth 2 (two) times daily.)   pravastatin (PRAVACHOL) 40 MG tablet TAKE 1 TABLET BY MOUTH IN  THE EVENING (Patient taking differently: Take 40 mg by mouth every evening.)    rivaroxaban (XARELTO) 20 MG TABS tablet Take 1 tablet (20 mg total) by mouth daily with supper.   timolol (TIMOPTIC) 0.5 % ophthalmic solution Place 1 drop into both eyes daily.   vitamin B-12 (CYANOCOBALAMIN) 1000 MCG tablet Take 1,000 mcg by mouth daily.   [DISCONTINUED] hydrALAZINE (APRESOLINE) 25 MG tablet Take 1 tablet (25 mg total) by mouth 2 (two) times daily.   Allergies  Allergen Reactions   Amlodipine Swelling    Ankle swelling   Azo Urinary Tract Support Other (See Comments)    Skin turned orange   Erythromycin Nausea And Vomiting   Recent Results (from the past 2160 hour(s))  CBC     Status: Abnormal   Collection Time: 07/09/21 10:14 AM  Result Value Ref Range   WBC 6.6 4.0 - 10.5 K/uL   RBC 4.08 3.87 - 5.11 MIL/uL   Hemoglobin 11.1 (L) 12.0 - 15.0 g/dL   HCT 34.3 (L) 36.0 - 46.0 %   MCV 84.1 80.0 - 100.0 fL   MCH 27.2 26.0 - 34.0 pg   MCHC 32.4 30.0 - 36.0 g/dL   RDW 15.1 11.5 - 15.5 %   Platelets 207 150 - 400 K/uL   nRBC 0.0 0.0 - 0.2 %    Comment: Performed at [facility], [address] [place], [place] [address]  Comprehensive metabolic panel     Status: Abnormal   Collection Time: 07/09/21 10:14 AM  Result Value Ref Range   Sodium 139 135 - 145 mmol/L   Potassium 3.4 (L) 3.5 - 5.1 mmol/L   Chloride 104 98 - 111 mmol/L  CO2 29 22 - 32 mmol/L   Glucose, Bld 131 (H) 70 - 99 mg/dL    Comment: Glucose reference range applies only to samples taken after fasting for at least 8 hours.   BUN 20 8 - 23 mg/dL   Creatinine, Ser 0.54 0.44 - 1.00 mg/dL   Calcium 8.7 (L) 8.9 - 10.3 mg/dL   Total Protein 6.3 (L) 6.5 - 8.1 g/dL   Albumin 3.6 3.5 - 5.0 g/dL   AST 22 15 - 41 U/L   ALT 23 0 - 44 U/L   Alkaline Phosphatase 48 38 - 126 U/L   Total Bilirubin 0.9 0.3 - 1.2 mg/dL   GFR, Estimated >60 >60 mL/min    Comment: (NOTE) Calculated using the CKD-EPI Creatinine Equation (2021)    Anion gap 6 5 - 15    Comment: Performed at [facility], [address] [place], [place] [address]   Objective  Body mass index is 36.52 kg/m. Wt Readings from Last 3 Encounters:  07/23/21 187 lb (84.8 kg)  05/20/21 187 lb (84.8 kg)  04/15/21 189 lb (85.7 kg)   Temp Readings from Last 3 Encounters:  07/23/21 (!) 97 F (36.1 C) (Temporal)  07/09/21 97.8 F (36.6 C)  05/20/21 97.9 F (36.6 C) (Oral)   BP Readings from Last 3 Encounters:  07/23/21 (!) 144/88  07/09/21 (!) 174/87  05/20/21 138/79   Pulse Readings from Last 3 Encounters:  07/23/21 69  07/09/21 61  05/20/21 (!) 59    Physical Exam Vitals and nursing note reviewed.  Constitutional:      Appearance: Normal appearance. She is well-developed and well-groomed. She is obese.  HENT:     Head: Normocephalic and atraumatic.  Eyes:     Conjunctiva/sclera: Conjunctivae normal.     Pupils: Pupils are equal, round, and reactive to light.  Cardiovascular:     Rate and Rhythm: Normal rate and regular rhythm.     Heart sounds: Normal heart sounds. No murmur heard.    Comments: B/l nonpitting edema 1+ Pulmonary:     Effort: Pulmonary effort is normal.     Breath sounds: Normal breath sounds.  Skin:    General: Skin is warm and dry.  Neurological:     General: No focal deficit present.     Mental Status: She is alert and oriented to person, place, and time. Mental status is at baseline.     Gait: Gait normal.  Psychiatric:        Attention and Perception: Attention and perception normal.        Mood and Affect: Mood and affect normal.        Speech: Speech normal.        Behavior: Behavior normal. Behavior is cooperative.        Thought Content: Thought content normal.        Cognition and Memory: Cognition and memory normal.        Judgment: Judgment normal.    Assessment  Plan  Frequent nosebleeds - Dr. Mertie Clause f/u they had given Gentamycin oint 2-3 x daily x 10 days 07/22/21 nasal dryness nose bleeds 07/09/21 ed visit used afrin x 5 days Ok humidifier or nasal  saline  Paroxysmal A-fib ([place]) - Plan: diltiazem (CARDIZEM CD) 180 MG 24 hr capsule Failed dccv 05/20/21 and now pending ablation Dr. Marcello Moores in [facility]   Kidney stones  Essential hypertension - Plan: diltiazem (CARDIZEM CD) 180 MG 24 hr capsule add back and increase from  120 cd, hydrALAZINE (APRESOLINE) 25 MG tablet taking bid rec take tid Dr.K cards took her off dilt 120 cd thought was causing leg swelling per pt been on long term and not doing so hydralazine was taking 25 bid not td per cards losartan 100 mg qd and lasix 40 mg qd  Disc with Dr. Raliegh Ip today adding back dilt and increase to 180 CD today better BP control He will contact her for appt in 1 week  H/a 8/10 left sided see below [facility] per pt ct head done and negative was given tramadol 50 mg but not filled Rx   Echo 04/27/21 normal and stress test  H/a left sided 8/10 worse 07/17/21 left sided to base of scalp could not sleep due to h/a   Leg edema - Plan: US Venous Img Lower Bilateral   H/a intractable new left sided  Ct head neg 07/17/21 will do mri with h/o afib  Control BP   HM  UTD vaccines (I.e prevnar, Tdap had 06/27/12) -flu shot utd rec shingrix vaccine  pna 23 had 11/28/19  covid vx 3/3 disc booster will sch   Colonoscopy had 12/24/13 colonic angioectasia/IH no f/u rec    mammo 08/23/19 neg ordered 08/25/2020 mammo negative ordered Ordered  DEXA 07/31/19 osteopenia vit d and calcium rec ordered    Out of pap window s/p hysterectomy then ovaries removed in 2002   dermatology [facility] Dr. Raliegh Ip utd    10/30/10 MRI L spine IMPRESSION:   Multilevel multifactorial degenerative changes which appear to be most severe at the L3-L4 level with moderate to severe thecal sac stenosis at the L 5 S1 level with areas of moderate to severe neuroforaminal narrowing on the left and moderate on the right. Exiting nerve root compression on the left is a diagnostic concern. There are also findings  concerning for mass effect upon the central aspect of the exiting S1 nerve root on the right at L5-S1.  Rec healthy diet and exercise    [facility]  Provider: Dr. Olivia Mackie McLean-Scocuzza-Internal Medicine

## 2021-07-23 NOTE — Patient Instructions (Addendum)
Nasal saline  Humidifier may help  If nose bleeds continue contact ENT   Nosebleed, Adult A nosebleed is when blood comes out of the nose. Nosebleeds are common and can be caused by many things. They are usually not a sign of a serious medicalproblem. Follow these instructions at home: When you have a nosebleed:  Sit down. Tilt your head a little forward. Follow these steps: Pinch your nose with a clean towel or tissue. Keep pinching your nose for 5 minutes. Do not let go. After 5 minutes, let go of your nose. If there is still bleeding, do these steps again. Keep doing these steps until the bleeding stops. Do not put tissues or other things in your nose to stop the bleeding. Avoid lying down or putting your head back. Use a nose spray decongestant as told by your doctor.  After a nosebleed: Try not to blow your nose or sniffle for several hours. Try not to strain, lift, or bend at the waist for several days. Aspirin and blood-thinning medicines make bleeding more likely. If you take these medicines: Ask your doctor if you should stop taking them or if you should change how much you take. Do not stop taking the medicine unless your doctor tells you to. If your nosebleed was caused by dryness, use over-the-counter saline nasal spray or gel and humidifier as told by your doctor. This will keep the inside of your nose moist and allow it to heal. If you need to use one of these products: Choose one that is water-soluble. Use only as much as you need and use it only as often as needed. Do not lie down right away after you use it. If you get nosebleeds often, talk with your doctor about treatments. These may include: Nasal cautery. A chemical swab or electrical device is used to lightly burn tiny blood vessels inside the nose. This helps stop or prevent nosebleeds. Nasal packing. A gauze or other material is placed in the nose to keep constant pressure on the bleeding area. Contact a  doctor if: You have a fever. You get nosebleeds often. You are getting nosebleeds more often than usual. You bruise very easily. You have something stuck in your nose. You have bleeding in your mouth. You vomit or cough up brown material. You get a nosebleed after you start a new medicine. Get help right away if: You have a nosebleed after you fall or hurt your head. Your nosebleed does not go away after 20 minutes. You feel dizzy or weak. You have unusual bleeding from other parts of your body. You have unusual bruising on other parts of your body. You get sweaty. You vomit blood. Summary Nosebleeds are common. They are usually not a sign of a serious medical problem. When you have a nosebleed, sit down and tilt your head a little forward. Pinch your nose with a clean tissue for 5 minutes. Use saline spray or saline gel and a humidifier as told by your doctor. Get help right away if your nosebleed does not go away after 20 minutes. This information is not intended to replace advice given to you by your health care provider. Make sure you discuss any questions you have with your healthcare provider. Document Revised: 10/11/2019 Document Reviewed: 10/11/2019 Elsevier Patient Education  2022 [employer].

## 2021-07-24 ENCOUNTER — Ambulatory Visit (INDEPENDENT_AMBULATORY_CARE_PROVIDER_SITE_OTHER): Payer: Medicare Other | Admitting: Podiatry

## 2021-07-24 DIAGNOSIS — M79675 Pain in left toe(s): Secondary | ICD-10-CM | POA: Diagnosis not present

## 2021-07-24 DIAGNOSIS — L989 Disorder of the skin and subcutaneous tissue, unspecified: Secondary | ICD-10-CM

## 2021-07-24 DIAGNOSIS — M79674 Pain in right toe(s): Secondary | ICD-10-CM

## 2021-07-24 DIAGNOSIS — B351 Tinea unguium: Secondary | ICD-10-CM

## 2021-07-24 NOTE — Progress Notes (Signed)
Subjective: Patient is a 87 y.o. female presenting to the office today for follow up evaluation of painful callus lesion(s) noted to the bilateral feet. Bearing weight increases the pain. She has not done anything at home for treatment.  Patient also complains of elongated, thickened nails that cause pain while ambulating in shoes. She is unable to trim her own nails. Patient presents today for further treatment and evaluation. Patient states that she is also been applying diclofenac topical to the bilateral feet for chronic foot pain.  She states that it does help alleviate some of her pain.   Patient also has a new complaint today regarding bilateral lower extremity edema that she states is been present for the past month.  She is currently taking Lasix and she is being treated managed by gynecological urologist at [facility] in [place].  She is concerned for the swelling of the feet and would like to have it evaluated today.  She presents for further treatment and evaluation  Past Medical History:  Diagnosis Date   Allergic rhinitis    Arthritis    Basal cell carcinoma 10/03/2019   Right prox. pretibial below knee. Nodular pattern. Tx: EDC   Basal cell carcinoma 04/21/2020   Right prox. mandible inf. to ear. Nodular pattern.   Basal cell carcinoma 04/21/2020   Left infranasal. Nodular pattern.   CHF (congestive heart failure) ([place]) 04/2016   developed 2 weeks after identifying AF   Complication of anesthesia    Does use hearing aid    right ear   Dysrhythmia 04/2016   a fib     Fall at home, subsequent encounter 08/02/2017   GERD (gastroesophageal reflux disease)    occasionally has reflux/heartburn   Hematuria    History of pelvic mass    s/p resection, benign   Hx of dysplastic nevus 06/13/2014   Right distal dorsum forearm near wrist. Moderate atypia, extends to peripheral margin   Hx of dysplastic nevus 02/01/2019   Right top of deltoid. Moderate atypia, limited  margins free   Hx of dysplastic nevus 02/01/2019   Left mid to upper back 5.0cm lateral to spine. Moderate atypia, close to margin   Hypercholesteremia    Hypertension    Kidney stones    Mitral regurgitation    noted echo 04/2016    Osteoporosis    per pt report on Medical Screening Form    PONV (postoperative nausea and vomiting)    Pyelonephritis    remote hx    Shortness of breath dyspnea    Squamous cell carcinoma of skin 04/02/2020   Left dorsum lat. wrist. SCCis, hypertophic. EDC   Squamous cell carcinoma of skin 06/03/2021   left forearm   Varicella zoster 2007    Objective:  Physical Exam General: Alert and oriented x3 in no acute distress  Dermatology: Hyperkeratotic lesion(s) present on the bilateral feet. Pain on palpation with a central nucleated core noted. Skin is warm, dry and supple bilateral lower extremities. Negative for open lesions or macerations. Nails are tender, long, thickened and dystrophic with subungual debris, consistent with onychomycosis, 1-5 bilateral. No signs of infection noted.  Vascular: Palpable pedal pulses bilaterally.  Pitting bilateral lower extremity edema noted. Capillary refill within normal limits.  Neurological: Epicritic and protective threshold grossly intact bilaterally.   Musculoskeletal Exam: Pain on palpation at the keratotic lesion(s) noted as well as the 5th MPJ of the left foot. Range of motion within normal limits bilateral. Muscle strength 5/5 in  all groups bilateral.  Assessment: 1. Onychodystrophic nails 1-5 bilateral with hyperkeratosis of nails.  2. Onychomycosis of nail due to dermatophyte bilateral 3. Pre-ulcerative callus lesions noted to the bilateral feet x 4 4.  Lower extremity edema bilateral   Plan of Care:  1. Patient evaluated. 2. Excisional debridement of keratoic lesion(s) using a chisel blade was performed without incident.  3. Dressed with light dressing. 4. Mechanical debridement of nails 1-5  bilaterally performed using a nail nipper. Filed with dremel without incident.  5.  Continue felt offload metatarsal pad to the insoles of the shoes 6.  Continue management with PCP and gynecological urologist for lower extremity edema.  I believe this is not associated to any foot pathology primarily  7.  Patient is to return to the clinic in 3 months.   Edrick Kins, DPM [facility]  Dr. Edrick Kins, DPM    [address], [place] [address]                Office [phone]  Fax [phone]

## 2021-07-28 ENCOUNTER — Other Ambulatory Visit: Payer: Self-pay

## 2021-07-28 ENCOUNTER — Ambulatory Visit (INDEPENDENT_AMBULATORY_CARE_PROVIDER_SITE_OTHER): Payer: Medicare Other | Admitting: Dermatology

## 2021-07-28 DIAGNOSIS — T148XXD Other injury of unspecified body region, subsequent encounter: Secondary | ICD-10-CM

## 2021-07-28 DIAGNOSIS — C44629 Squamous cell carcinoma of skin of left upper limb, including shoulder: Secondary | ICD-10-CM | POA: Diagnosis not present

## 2021-07-28 DIAGNOSIS — C4492 Squamous cell carcinoma of skin, unspecified: Secondary | ICD-10-CM

## 2021-07-28 MED ORDER — MUPIROCIN 2 % EX OINT: TOPICAL_OINTMENT | CUTANEOUS | 0 refills | Status: AC

## 2021-07-28 NOTE — Progress Notes (Signed)
   Follow-Up Visit   Subjective  Tina Howard is a 87 y.o. female who presents for the following: Bx proven SCC (Of the L forearm - patient is here today for [facility]). Patient also has a non-healing wound on the L lower leg that has been there for 2 months and she would like it checked today.   The following portions of the chart were reviewed this encounter and updated as appropriate:   Tobacco  Allergies  Meds  Problems  Med Hx  Surg Hx  Fam Hx     Review of Systems:  No other skin or systemic complaints except as noted in HPI or Assessment and Plan.  Objective  Well appearing patient in no apparent distress; mood and affect are within normal limits.  A focused examination was performed including the arms. Relevant physical exam findings are noted in the Assessment and Plan.  L forearm Healing biopsy site.  L lower leg Healing ulceration.  Assessment & Plan  Squamous cell carcinoma of skin L forearm  Destruction of lesion Complexity: extensive   Destruction method: electrodesiccation and curettage   Informed consent: discussed and consent obtained   Timeout:  patient name, date of birth, surgical site, and procedure verified Procedure prep:  Patient was prepped and draped in usual sterile fashion Prep type:  Isopropyl alcohol Anesthesia: the lesion was anesthetized in a standard fashion   Anesthetic:  1% lidocaine w/ epinephrine 1-100,000 buffered w/ 8.4% NaHCO3 Curettage performed in three different directions: Yes   Electrodesiccation performed over the curetted area: Yes   Lesion length (cm):  1.6 Lesion width (cm):  1.6 Hemostasis achieved with:  pressure, aluminum chloride and electrodesiccation Outcome: patient tolerated procedure well with no complications   Post-procedure details: sterile dressing applied and wound care instructions given   Dressing type: bandage and petrolatum    Wound healing, delayed L lower leg  Due to trauma -   Start Mupirocin 2%  ointment to aa and cover with bandage daily until healed.   mupirocin ointment (BACTROBAN) 2 % - L lower leg Apply to healing wound once daily and cover with bandage.  Return for appointment as scheduled.  Luther Redo, CMA, am acting as scribe for Forest Gleason, MD .  Documentation: I have reviewed the above documentation for accuracy and completeness, and I agree with the above.  Forest Gleason, MD

## 2021-07-28 NOTE — Patient Instructions (Signed)
Electrodesiccation and Curettage ("Scrape and Burn") Wound Care Instructions  Leave the original bandage on for 24 hours if possible.  If the bandage becomes soaked or soiled before that time, it is OK to remove it and examine the wound.  A small amount of post-operative bleeding is normal.  If excessive bleeding occurs, remove the bandage, place gauze over the site and apply continuous pressure (no peeking) over the area for 30 minutes. If this does not work, please call our clinic as soon as possible or page your doctor if it is after hours.   Once a day, cleanse the wound with soap and water. It is fine to shower. If a thick crust develops you may use a Q-tip dipped into dilute hydrogen peroxide (mix 1:1 with water) to dissolve it.  Hydrogen peroxide can slow the healing process, so use it only as needed.    After washing, apply petroleum jelly (Vaseline) or an antibiotic ointment if your doctor prescribed one for you, followed by a bandage.    For best healing, the wound should be covered with a layer of ointment at all times. If you are not able to keep the area covered with a bandage to hold the ointment in place, this may mean re-applying the ointment several times a day.  Continue this wound care until the wound has healed and is no longer open. It may take several weeks for the wound to heal and close.  Itching and mild discomfort is normal during the healing process.  If you have any discomfort, you can take Tylenol (acetaminophen) or ibuprofen as directed on the bottle. (Please do not take these if you have an allergy to them or cannot take them for another reason).  Some redness, tenderness and white or yellow material in the wound is normal healing.  If the area becomes very sore and red, or develops a thick yellow-green material (pus), it may be infected; please notify us.    Wound healing continues for up to one year following surgery. It is not unusual to experience pain in the scar  from time to time during the interval.  If the pain becomes severe or the scar thickens, you should notify the office.    A slight amount of redness in a scar is expected for the first six months.  After six months, the redness will fade and the scar will soften and fade.  The color difference becomes less noticeable with time.  If there are any problems, return for a post-op surgery check at your earliest convenience.  To improve the appearance of the scar, you can use silicone scar gel, cream, or sheets (such as Mederma or Serica) every night for up to one year. These are available over the counter (without a prescription).  Please call our office at [phone] for any questions or concerns.  If you have any questions or concerns for your doctor, please call our main line at [phone] and press option 4 to reach your doctor's medical assistant. If no one answers, please leave a voicemail as directed and we will return your call as soon as possible. Messages left after 4 pm will be answered the following business day.   You may also send [place] a message via [place]. We typically respond to MyChart messages within 1-2 business days.  For prescription refills, please ask your pharmacy to contact our office. Our fax number is [phone].  If you have an urgent issue when the clinic is closed that  cannot wait until the next business day, you can page your doctor at the number below.    Please note that while we do our best to be available for urgent issues outside of office hours, we are not available 24/7.   If you have an urgent issue and are unable to reach [place], you may choose to seek medical care at your doctor's office, retail clinic, urgent care center, or emergency room.  If you have a medical emergency, please immediately call 911 or go to the emergency department.  Pager Numbers  - Dr. Nehemiah Massed: [phone]  - Dr. Laurence Ferrari: [phone]  - Dr. Nicole Kindred: [phone]  In the event  of inclement weather, please call our main line at [phone] for an update on the status of any delays or closures.  Dermatology Medication Tips: Please keep the boxes that topical medications come in in order to help keep track of the instructions about where and how to use these. Pharmacies typically print the medication instructions only on the boxes and not directly on the medication tubes.   If your medication is too expensive, please contact our office at [phone] option 4 or send [place] a message through [place].   We are unable to tell what your co-pay for medications will be in advance as this is different depending on your insurance coverage. However, we may be able to find a substitute medication at lower cost or fill out paperwork to get insurance to cover a needed medication.   If a prior authorization is required to get your medication covered by your insurance company, please allow [place] 1-2 business days to complete this process.  Drug prices often vary depending on where the prescription is filled and some pharmacies may offer cheaper prices.  The website www.goodrx.com contains coupons for medications through different pharmacies. The prices here do not account for what the cost may be with help from insurance (it may be cheaper with your insurance), but the website can give you the price if you did not use any insurance.  - You can print the associated coupon and take it with your prescription to the pharmacy.  - You may also stop by our office during regular business hours and pick up a GoodRx coupon card.  - If you need your prescription sent electronically to a different pharmacy, notify our office through [facility] or by phone at [phone] option 4.

## 2021-08-03 ENCOUNTER — Ambulatory Visit: Payer: Medicare Other

## 2021-08-04 ENCOUNTER — Other Ambulatory Visit: Payer: Self-pay

## 2021-08-04 ENCOUNTER — Emergency Department
Admission: EM | Admit: 2021-08-04 | Discharge: 2021-08-04 | Disposition: A | Discharge status: Home / Self Care | Payer: Medicare Other | Attending: Emergency Medicine | Admitting: Emergency Medicine

## 2021-08-04 ENCOUNTER — Emergency Department: Payer: Medicare Other

## 2021-08-04 ENCOUNTER — Telehealth: Payer: Self-pay | Admitting: Internal Medicine

## 2021-08-04 DIAGNOSIS — I110 Hypertensive heart disease with heart failure: Secondary | ICD-10-CM | POA: Insufficient documentation

## 2021-08-04 DIAGNOSIS — Z96652 Presence of left artificial knee joint: Secondary | ICD-10-CM | POA: Insufficient documentation

## 2021-08-04 DIAGNOSIS — Z7901 Long term (current) use of anticoagulants: Secondary | ICD-10-CM | POA: Diagnosis not present

## 2021-08-04 DIAGNOSIS — Z79899 Other long term (current) drug therapy: Secondary | ICD-10-CM | POA: Diagnosis not present

## 2021-08-04 DIAGNOSIS — R600 Localized edema: Secondary | ICD-10-CM | POA: Diagnosis not present

## 2021-08-04 DIAGNOSIS — R0602 Shortness of breath: Secondary | ICD-10-CM | POA: Diagnosis present

## 2021-08-04 DIAGNOSIS — Z85820 Personal history of malignant melanoma of skin: Secondary | ICD-10-CM | POA: Diagnosis not present

## 2021-08-04 DIAGNOSIS — I509 Heart failure, unspecified: Secondary | ICD-10-CM

## 2021-08-04 LAB — HEPATIC FUNCTION PANEL
ALT: 45 U/L — ABNORMAL HIGH (ref 0–44)
AST: 36 U/L (ref 15–41)
Albumin: 3.8 g/dL (ref 3.5–5.0)
Alkaline Phosphatase: 54 U/L (ref 38–126)
Bilirubin, Direct: 0.2 mg/dL (ref 0.0–0.2)
Indirect Bilirubin: 0.8 mg/dL (ref 0.3–0.9)
Total Bilirubin: 1 mg/dL (ref 0.3–1.2)
Total Protein: 6.6 g/dL (ref 6.5–8.1)

## 2021-08-04 LAB — BASIC METABOLIC PANEL
Anion gap: 9 (ref 5–15)
BUN: 25 mg/dL — ABNORMAL HIGH (ref 8–23)
CO2: 28 mmol/L (ref 22–32)
Calcium: 8.9 mg/dL (ref 8.9–10.3)
Chloride: 102 mmol/L (ref 98–111)
Creatinine, Ser: 0.67 mg/dL (ref 0.44–1.00)
GFR, Estimated: >60 mL/min (ref >60)
Glucose, Bld: 123 mg/dL — ABNORMAL HIGH (ref 70–99)
Potassium: 3.6 mmol/L (ref 3.5–5.1)
Sodium: 139 mmol/L (ref 135–145)

## 2021-08-04 LAB — CBC
HCT: 34.3 % — ABNORMAL LOW (ref 36.0–46.0)
Hemoglobin: 10.9 g/dL — ABNORMAL LOW (ref 12.0–15.0)
MCH: 26.7 pg (ref 26.0–34.0)
MCHC: 31.8 g/dL (ref 30.0–36.0)
MCV: 83.9 fL (ref 80.0–100.0)
Platelets: 216 10*3/uL (ref 150–400)
RBC: 4.09 MIL/uL (ref 3.87–5.11)
RDW: 16.1 % — ABNORMAL HIGH (ref 11.5–15.5)
WBC: 8.8 10*3/uL (ref 4.0–10.5)
nRBC: 0 % (ref 0.0–0.2)

## 2021-08-04 LAB — BRAIN NATRIURETIC PEPTIDE: B Natriuretic Peptide: 272.8 pg/mL — ABNORMAL HIGH (ref 0.0–100.0)

## 2021-08-04 LAB — TROPONIN I (HIGH SENSITIVITY)
Troponin I (High Sensitivity): 7 ng/L (ref <18)
Troponin I (High Sensitivity): 7 ng/L (ref <18)

## 2021-08-04 MED ORDER — FUROSEMIDE 10 MG/ML IJ SOLN
40.0000 mg | Freq: Once | INTRAMUSCULAR | Status: AC
  Administered 2021-08-04: 40 mg via INTRAVENOUS
  Filled 2021-08-04: qty 4

## 2021-08-04 MED ORDER — FUROSEMIDE 40 MG PO TABS: 60.0000 mg | ORAL_TABLET | Freq: Every day | ORAL | 0 refills | Status: AC

## 2021-08-04 NOTE — Telephone Encounter (Signed)
Patient informed, Due to the high volume of calls and your symptoms we have to forward your call to our Triage Nurse to expedient your call. Please hold for the transfer.  Patient transferred to Access Nurse. Due to having trouble breathing during the night and she is in constant afib.Patient is unsure if she should see her pcp or cardiologist.No available openings in office or virtual.

## 2021-08-04 NOTE — ED Triage Notes (Signed)
Pt to ED [facility] from home for shob that started this am. Pt speaking in complete sentences, NAD noted  Reports recent cardioversion from a fib, unable to give specific date.

## 2021-08-04 NOTE — Telephone Encounter (Signed)
Patient is currently in ED 

## 2021-08-04 NOTE — ED Notes (Signed)
Pt ambulated at this time. Reports some shortness of breath. O2 sat maintained at 100%

## 2021-08-04 NOTE — Discharge Instructions (Addendum)
FOR YOUR LASIX:  START taking 60 mg daily, instead of 40 mg  You currently have 40 mg tablets prescribed, so start taking ONE AND A HALF in the mornings. I've also called in more if needed until you see Cardiology.   Follow-up with Cardiology in the next several days.

## 2021-08-04 NOTE — ED Provider Notes (Signed)
[facility] Emergency Department Provider Note  ____________________________________________   Event Date/Time   First MD Initiated Contact with Patient 08/04/21 1037     (approximate)  I have reviewed the triage vital signs and the nursing notes.   HISTORY  Chief Complaint Shortness of Breath    HPI Tina Howard is a 87 y.o. female with past medical history as below here with shortness of breath.  The patient states that over the last 24 hours, she has felt generally short of breath.  She states that this has been happening intermittently but was worse over the last 24 hours.  She felt like she had a pressure-like sensation in her chest.  No overt chest pain.  She does note she has had some increasing swelling.  She felt more short of breath with lying flat and has been sitting on more pillows.  Denies any recent medication changes.  She does admit to leg swelling, states it has been slightly worse than usual.  She is been taking her Lasix but does not feel like she pees as much as she should.  No fevers or chills.  No recent sick contacts.  No other medical complaints.      Past Medical History:  Diagnosis Date   Allergic rhinitis    Arthritis    Basal cell carcinoma 10/03/2019   Right prox. pretibial below knee. Nodular pattern. Tx: EDC   Basal cell carcinoma 04/21/2020   Right prox. mandible inf. to ear. Nodular pattern.   Basal cell carcinoma 04/21/2020   Left infranasal. Nodular pattern.   CHF (congestive heart failure) ([place]) 04/2016   developed 2 weeks after identifying AF   Complication of anesthesia    Does use hearing aid    right ear   Dysrhythmia 04/2016   a fib     Fall at home, subsequent encounter 08/02/2017   GERD (gastroesophageal reflux disease)    occasionally has reflux/heartburn   Hematuria    History of pelvic mass    s/p resection, benign   Hx of dysplastic nevus 06/13/2014   Right distal dorsum forearm near wrist.  Moderate atypia, extends to peripheral margin   Hx of dysplastic nevus 02/01/2019   Right top of deltoid. Moderate atypia, limited margins free   Hx of dysplastic nevus 02/01/2019   Left mid to upper back 5.0cm lateral to spine. Moderate atypia, close to margin   Hypercholesteremia    Hypertension    Kidney stones    Mitral regurgitation    noted echo 04/2016    Osteoporosis    per pt report on Medical Screening Form    PONV (postoperative nausea and vomiting)    Pyelonephritis    remote hx    Shortness of breath dyspnea    Squamous cell carcinoma of skin 04/02/2020   Left dorsum lat. wrist. SCCis, hypertophic. 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   Rotator cuff tendinitis, right 06/05/2018   History of total knee arthroplasty 03/02/2018  Osteoarthritis of hip 03/02/2018   HLD (hyperlipidemia) 12/29/2017   Cardiac murmur 12/29/2017   Mitral regurgitation 12/29/2017   Osteopenia 12/01/2017   First degree AV block 08/02/2017   Paroxysmal A-fib ([place]) 05/26/2016   Essential hypertension 05/26/2016   Hypokalemia 05/26/2016   Seasonal allergies 05/06/2016   Obesity (BMI 30-39.9) 11/13/2014   Back pain, chronic 05/08/2014   Prediabetes 05/08/2014   Screening for breast cancer 06/27/2012   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 Sandra Cockayne   CYSTOSCOPY     ELECTROPHYSIOLOGIC STUDY N/A 06/15/2016   Procedure: CARDIOVERSION;  Surgeon: [name], MD;  Location: [facility] ORS;  Service: Cardiovascular;  Laterality: N/A;   EYE SURGERY     bilateral cataract   FOOT SURGERY     HERNIA REPAIR  2013   inguinal   JOINT REPLACEMENT  [id number]   bilateral knee replacements   KNEE ARTHROSCOPY     right   pelvic mass removed 3x7 x 11 1990s or early 2000s     s/p hysterectomy    RECTOCELE REPAIR     REVERSE SHOULDER ARTHROPLASTY Right 06/27/2018   Procedure: REVERSE SHOULDER ARTHROPLASTY;  Surgeon: Corky Mull, MD;  Location: [facility] ORS;  Service: Orthopedics;  Laterality: Right;   TOTAL KNEE ARTHROPLASTY     left   VAGINAL HYSTERECTOMY  1970   benign reasons    Prior to Admission medications   Medication Sig Start Date End Date Taking? Authorizing Provider  acetaminophen (TYLENOL) 500 MG tablet Take 500-1,000 mg by mouth 2 (two) times daily as needed for moderate pain or headache.    [provider]  amiodarone (PACERONE) 200 MG tablet Take 1 tablet by mouth 2 (two) times daily. 06/02/21 06/02/22  [provider]  Calcium Carb-Cholecalciferol (CALCIUM 600 + D PO) Take 1 tablet by mouth daily.    [provider]  Carboxymethylcellul-Glycerin (LUBRICATING EYE DROPS OP) Place 1 drop into both eyes daily as needed (dry eyes). Patient not taking: No sig reported    [provider]  cholecalciferol (VITAMIN D3) 25 MCG (1000 UNIT) tablet Take 1,000 Units by mouth daily.    [provider]  diclofenac sodium (VOLTAREN) 1 % GEL Apply 1 application topically as needed (foot pain). Patient taking differently: Apply 1 application topically 4 (four) times daily  as needed (foot pain). 05/15/19   Edrick Kins, DPM  diltiazem (CARDIZEM CD) 180 MG 24 hr capsule Take 1 capsule (180 mg total) by mouth daily. 07/23/21   McLean-Scocuzza, Nino Glow, MD  diltiazem (CARDIZEM CD) 180 MG 24 hr capsule Take 1 capsule (180 mg total) by mouth daily. 07/23/21   McLean-Scocuzza, Nino Glow, MD  docusate calcium (SURFAK) 240 MG capsule Take 240 mg by mouth at bedtime.     [provider]  estradiol (ESTRACE) 0.1 MG/GM vaginal cream Place 0.5 g vaginally 2 (two) times a week. Place 0.5g nightly for two weeks then twice a week after Patient not taking: No sig reported 03/19/21   Jaquita Folds, MD  furosemide (LASIX) 40  MG tablet Take 1.5 tablets (60 mg total) by mouth daily. 08/04/21   Duffy Bruce, MD  hydrALAZINE (APRESOLINE) 25 MG tablet Take 1 tablet (25 mg total) by mouth 3 (three) times daily. 07/23/21 07/23/22  McLean-Scocuzza, Nino Glow, MD  losartan (COZAAR) 100 MG tablet Take 1 tablet (100 mg total) by mouth daily. 03/24/21   McLean-Scocuzza, Nino Glow, MD  mupirocin ointment (BACTROBAN) 2 % Apply to healing wound once daily and cover with bandage. 07/28/21   Moye, [place], MD  potassium chloride SA (KLOR-CON) 20 MEQ tablet Take 1 tablet (20 mEq total) by mouth 3 (three) times daily. Patient taking differently: Take 20 mEq by mouth 2 (two) times daily. 03/24/21   McLean-Scocuzza, Nino Glow, MD  pravastatin (PRAVACHOL) 40 MG tablet TAKE 1 TABLET BY MOUTH IN  THE EVENING Patient taking differently: Take 40 mg by mouth every evening. 02/05/21   McLean-Scocuzza, Nino Glow, MD  rivaroxaban (XARELTO) 20 MG TABS tablet Take 1 tablet (20 mg total) by mouth daily with supper. 03/06/18   Burnard Hawthorne, FNP  timolol (TIMOPTIC) 0.5 % ophthalmic solution Place 1 drop into both eyes daily. 04/10/19   [provider]  vitamin B-12 (CYANOCOBALAMIN) 1000 MCG tablet Take 1,000 mcg by mouth daily.    [provider]    Allergies Amlodipine, Azo urinary tract  support, and Erythromycin  Family History  Problem Relation Age of Onset   Emphysema Mother    Heart disease Father    Early death Father        died 37 y.o MI    Breast cancer Maternal Aunt 80   Kidney Stones Son     Social History Social History   Tobacco Use   Smoking status: Never   Smokeless tobacco: Never  Vaping Use   Vaping Use: Never used  Substance Use Topics   Alcohol use: Yes    Alcohol/week: 2.0 standard drinks    Types: 2 Glasses of wine per week    Comment: occassional   Drug use: No    Review of Systems  Review of Systems  Constitutional:  Positive for fatigue. Negative for fever.  HENT:  Negative for congestion and sore throat.   Eyes:  Negative for visual disturbance.  Respiratory:  Positive for shortness of breath. Negative for cough.   Cardiovascular:  Positive for leg swelling. Negative for chest pain.  Gastrointestinal:  Negative for abdominal pain, diarrhea, nausea and vomiting.  Genitourinary:  Negative for flank pain.  Musculoskeletal:  Negative for back pain and neck pain.  Skin:  Negative for rash and wound.  Neurological:  Negative for weakness.  All other systems reviewed and are negative.   ____________________________________________  PHYSICAL EXAM:      VITAL SIGNS: ED Triage Vitals [08/04/21 0939]  Enc Vitals Group     BP (!) 185/80     Pulse Rate 71     Resp 20     Temp 97.8 F (36.6 C)     Temp Source Oral     SpO2 100 %     Weight 182 lb (82.6 kg)     Height 5' (1.524 m)     Head Circumference      Peak Flow      Pain Score 0     Pain Loc      Pain Edu?      Excl. in [place]?      Physical Exam Vitals and nursing note reviewed.  Constitutional:  General: She is not in acute distress.    Appearance: She is well-developed.  HENT:     Head: Normocephalic and atraumatic.  Eyes:     Conjunctiva/sclera: Conjunctivae normal.  Cardiovascular:     Rate and Rhythm: Normal rate and regular rhythm.     Heart sounds:  Normal heart sounds. No murmur heard.   No friction rub.  Pulmonary:     Effort: Pulmonary effort is normal. No respiratory distress.     Breath sounds: Examination of the right-lower field reveals rales. Examination of the left-lower field reveals rales. Rales present. No wheezing.  Abdominal:     General: There is no distension.     Palpations: Abdomen is soft.     Tenderness: There is no abdominal tenderness.  Musculoskeletal:     Cervical back: Neck supple.     Right lower leg: Edema (2+ pitting) present.     Left lower leg: Edema (2+ pitting) present.  Skin:    General: Skin is warm.     Capillary Refill: Capillary refill takes less than 2 seconds.  Neurological:     Mental Status: She is alert and oriented to person, place, and time.     Motor: No abnormal muscle tone.      ____________________________________________   LABS (all labs ordered are listed, but only abnormal results are displayed)  Labs Reviewed  BASIC METABOLIC PANEL - Abnormal; Notable for the following components:      Result Value   Glucose, Bld 123 (*)    BUN 25 (*)    All other components within normal limits  CBC - Abnormal; Notable for the following components:   Hemoglobin 10.9 (*)    HCT 34.3 (*)    RDW 16.1 (*)    All other components within normal limits  BRAIN NATRIURETIC PEPTIDE - Abnormal; Notable for the following components:   B Natriuretic Peptide 272.8 (*)    All other components within normal limits  HEPATIC FUNCTION PANEL - Abnormal; Notable for the following components:   ALT 45 (*)    All other components within normal limits  TROPONIN I (HIGH SENSITIVITY)  TROPONIN I (HIGH SENSITIVITY)    ____________________________________________  EKG: Atrial fibrillation, ventricular rate 65.  QRS 90, QTc 445.  No acute ST elevations or depressions.  No EKG evidence of acute ischemia or infarct. ________________________________________  RADIOLOGY All imaging, including plain films,  CT scans, and ultrasounds, independently reviewed by me, and interpretations confirmed via formal radiology reads.  ED MD interpretation:   Chest x-ray: No acute disease  Official radiology report(s): DG Chest 2 View  Result Date: 08/04/2021 CLINICAL DATA:  Shortness of breath EXAM: CHEST - 2 VIEW COMPARISON:  03/26/2020 FINDINGS: Chronic interstitial changes are stable. No pleural effusion. No pneumothorax. Stable cardiomediastinal contours. No acute osseous abnormality. Reverse right total shoulder arthroplasty. IMPRESSION: No acute process in the chest. Electronically Signed   By: Macy Mis M.D.   On: 08/04/2021 10:55    ____________________________________________  PROCEDURES   Procedure(s) performed (including Critical Care):  Procedures  ____________________________________________  INITIAL IMPRESSION / MDM / [place] / ED COURSE  As part of my medical decision making, I reviewed the following data within the [place] notes reviewed and incorporated, Old chart reviewed, Notes from prior ED visits, and [place] Controlled Substance Database       *VALLE POIRRIER was evaluated in Emergency Department on 08/04/2021 for the symptoms described in the history of present  illness. She was evaluated in the context of the global COVID-19 pandemic, which necessitated consideration that the patient might be at risk for infection with the SARS-CoV-2 virus that causes COVID-19. Institutional protocols and algorithms that pertain to the evaluation of patients at risk for COVID-19 are in a state of rapid change based on information released by regulatory bodies including the CDC and federal and state organizations. These policies and algorithms were followed during the patient's care in the ED.  Some ED evaluations and interventions may be delayed as a result of limited staffing during the pandemic.*     Medical Decision Making: 86 year old well-appearing  female here with mild shortness of breath and orthopnea.  Clinically, she appears mildly edematous and hypervolemic.  I suspect this is the etiology of her shortness of breath.  EKG shows A. fib which is her baseline.  Otherwise, BNP elevated and she has pitting edema.  Lab work otherwise very reassuring.  EKG is nonischemic and troponins are negative x2, do not suspect ACS.  She has no evidence to suggest pneumonia or infectious etiology.  She was given Lasix with improvement in her symptoms and is ambulatory without any kind of difficulty.  She feels better after IV Lasix.  I discussed the case with cardiology, Dr. Ubaldo Glassing, who agrees to increasing Lasix from 40 mg to 60 mg with close outpatient follow-up.  Return precautions were given in detail.  ____________________________________________  FINAL CLINICAL IMPRESSION(S) / ED DIAGNOSES  Final diagnoses:  Acute on chronic congestive heart failure, unspecified heart failure type ([place])     MEDICATIONS GIVEN DURING THIS VISIT:  Medications  furosemide (LASIX) injection 40 mg (40 mg Intravenous Given 08/04/21 1214)     ED Discharge Orders          Ordered    furosemide (LASIX) 40 MG tablet  Daily        08/04/21 1511             Note:  This document was prepared using Dragon voice recognition software and may include unintentional dictation errors.   Duffy Bruce, MD 08/04/21 1530

## 2021-08-05 ENCOUNTER — Encounter: Payer: Self-pay | Admitting: Dermatology

## 2021-08-05 NOTE — Telephone Encounter (Signed)
Pt in persistent Afib can you help?

## 2021-08-11 NOTE — Progress Notes (Signed)
[facility] Return Visit  SUBJECTIVE  History of Present Illness: Tina Howard is a 87 y.o. female seen in follow-up for overactive bladder. Plan at last visit was to work on bladder retraining and decrease bladder irritants. Referral was also placed to PT but she did not attend yet- has weekly appointments set up.   Has had several admissions for a. Fib and is waiting for an ablation at [facility].   Sensation of burning with urination is still there. Still has leakage at home with water running and while out in public, wears depends. Still drinks 2 cups decaf coffee and pineapple juice.   Has not been using the vaginal estrogen cream because she has not been able to reach.   Past Medical History: Patient  has a past medical history of Allergic rhinitis, Arthritis, Basal cell carcinoma (10/03/2019), Basal cell carcinoma (04/21/2020), Basal cell carcinoma (04/21/2020), CHF (congestive heart failure) ([place]) ([id number]), Complication of anesthesia, Does use hearing aid, Dysrhythmia (04/2016), Fall at home, subsequent encounter (08/02/2017), GERD (gastroesophageal reflux disease), Hematuria, History of pelvic mass, dysplastic nevus (06/13/2014), dysplastic nevus (02/01/2019), dysplastic nevus (02/01/2019), Hypercholesteremia, Hypertension, Kidney stones, Mitral regurgitation, Osteoporosis, PONV (postoperative nausea and vomiting), Pyelonephritis, Shortness of breath dyspnea, Squamous cell carcinoma of skin (04/02/2020), Squamous cell carcinoma of skin (06/03/2021), and Varicella zoster (2007).   Past Surgical History: She  has a past surgical history that includes Total knee arthroplasty; Knee arthroscopy; Cataract extraction (Bilateral, 2014); Foot surgery; Cardiac catheterization (N/A, 06/15/2016); Back surgery (1984); Eye surgery; bladder prolapse repair; Rectocele repair; Hernia repair (2013); Joint replacement [id number]); Vaginal hysterectomy (1970); Reverse shoulder arthroplasty (Right,  06/27/2018); Breast cyst excision (Left); pelvic mass removed 3x7 x 11 1990s or early 2000s; Cystoscopy; and Cardioversion (N/A, 05/20/2021).   Medications: She has a current medication list which includes the following prescription(s): acetaminophen, amiodarone, calcium carb-cholecalciferol, carboxymethylcellul-glycerin, cholecalciferol, diclofenac sodium, diltiazem, docusate calcium, estradiol, furosemide, hydralazine, losartan, mupirocin ointment, potassium chloride sa, pravastatin, rivaroxaban, timolol, and vitamin b-12.   Allergies: Patient is allergic to amlodipine, azo urinary tract support, and erythromycin.   Social History: Patient  reports that she has never smoked. She has never used smokeless tobacco. She reports current alcohol use of about 2.0 standard drinks per week. She reports that she does not use drugs.      OBJECTIVE     Physical Exam: Vitals:   08/12/21 1132  BP: 109/71  Pulse: 91   Gen: No apparent distress, A&O x 3.  Detailed Urogynecologic Evaluation:  Deferred.    ASSESSMENT AND PLAN    Ms. Dokes is a 87 y.o. with:  1. Overactive bladder   2. Vaginal atrophy    OAB - She has been started on new medications for her HTN, so with that and her age, we are limited with any medications that can be prescribed. She is potentially interested in PTNS, but has several PT appointments coming up along with her ablation.  - Start physical therapy then can reassess symptoms if not improved.   2. Vaginal atrophy - She has obtained long q-tips that she can use to apply estrogen cream. Will place estrace- pea sized amount, at the vaginal opening twice a week.   Follow up as needed  Jaquita Folds, MD  Time spent: I spent 20 minutes dedicated to the care of this patient on the date of this encounter to include pre-visit review of records, face-to-face time with the patient and post visit documentation.

## 2021-08-12 ENCOUNTER — Ambulatory Visit: Payer: Medicare Other

## 2021-08-12 ENCOUNTER — Encounter: Payer: Self-pay | Admitting: Obstetrics and Gynecology

## 2021-08-12 ENCOUNTER — Other Ambulatory Visit: Payer: Self-pay

## 2021-08-12 ENCOUNTER — Ambulatory Visit (INDEPENDENT_AMBULATORY_CARE_PROVIDER_SITE_OTHER): Payer: Medicare Other | Admitting: Obstetrics and Gynecology

## 2021-08-12 VITALS — BP 109/71 | HR 91

## 2021-08-12 DIAGNOSIS — N952 Postmenopausal atrophic vaginitis: Secondary | ICD-10-CM

## 2021-08-12 DIAGNOSIS — N3281 Overactive bladder: Secondary | ICD-10-CM | POA: Diagnosis not present

## 2021-08-12 NOTE — Patient Instructions (Addendum)
Today we talked about ways to manage bladder urgency such as altering your diet to avoid irritative beverages and foods (bladder diet) as well as attempting to decrease stress and other exacerbating factors.    The Most Bothersome Foods* The Least Bothersome Foods*  Coffee - Regular & Decaf Tea - caffeinated Carbonated beverages - cola, non-colas, diet & caffeine-free Alcohols - Beer, Red Wine, White Wine, Champagne Fruits - Grapefruit, [place], Orange, [employer] - Cranberry, Grapefruit, Orange, Pineapple Vegetables - Tomato & Tomato Products Flavor Enhancers - Hot peppers, Spicy foods, Chili, Horseradish, Vinegar, Monosodium glutamate (MSG) Artificial Sweeteners - NutraSweet, Sweet 'N Low, Equal (sweetener), Saccharin Ethnic foods - [place], [place], [place] food [employer] - low-fat & whole Fruits - Bananas, Blueberries, Honeydew melon, Pears, Raisins, Watermelon Vegetables - Broccoli, Brussels Sprouts, [place], Carrots, Cauliflower, [place], Cucumber, Mushrooms, Peas, Radishes, Squash, Zucchini, White potatoes, Sweet potatoes & yams Poultry - Chicken, Eggs, [place], [employer] - Beef, Programmer, multimedia, Lamb Seafood - Shrimp, [place] fish, Salmon Grains - Oat, Rice Snacks - Pretzels, Popcorn  *Lissa Morales et al. Diet and its role in interstitial cystitis/bladder pain syndrome (IC/BPS) and comorbid conditions. [place] 2012 Jan 11.    Use a pea sized amount of vaginal estrogen twice a week at the opening of the vagina to help with burning.

## 2021-08-19 ENCOUNTER — Telehealth: Payer: Self-pay | Admitting: Internal Medicine

## 2021-08-21 NOTE — Telephone Encounter (Signed)
How do I access the death certificate? I recall there being a computerized system to do this though I do not remember the name of it or how to access it.

## 2021-08-21 NOTE — Telephone Encounter (Signed)
[employer] called needing death certificate signed to cremate and to allow the MA  to follow up due to the patient expiring at home.

## 2021-08-21 NOTE — Telephone Encounter (Signed)
This has been completed.

## 2021-08-22 ENCOUNTER — Other Ambulatory Visit: Payer: Self-pay | Admitting: Internal Medicine

## 2021-08-22 DIAGNOSIS — I480 Paroxysmal atrial fibrillation: Secondary | ICD-10-CM

## 2021-08-22 DIAGNOSIS — I10 Essential (primary) hypertension: Secondary | ICD-10-CM

## 2021-08-24 ENCOUNTER — Ambulatory Visit: Payer: Medicare Other | Admitting: Physical Therapy

## 2021-08-27 NOTE — Telephone Encounter (Signed)
Noted.  I am happy to fill out the death certificate once we receive it.

## 2021-08-27 NOTE — Telephone Encounter (Signed)
Officer Rosana Hoes called the practice to inform the patients provider she was declared deceased at 11:19 am on today September 16, 2021 . According to the information the family gave the officer the patient has been having respiratory issues for about a week . Her family last spoke tot her last night 08/18/21 around 11:00 pm.

## 2021-08-27 DEATH — deceased

## 2021-08-28 ENCOUNTER — Telehealth: Payer: Self-pay

## 2021-08-28 NOTE — Telephone Encounter (Signed)
Confirmed faxed request of information form sent to [facility]. Form has been sent to scan.

## 2021-09-03 ENCOUNTER — Encounter: Payer: Medicare Other | Admitting: Physical Therapy

## 2021-09-08 ENCOUNTER — Encounter: Payer: Medicare Other | Admitting: Physical Therapy

## 2021-09-15 ENCOUNTER — Encounter: Payer: Medicare Other | Admitting: Physical Therapy

## 2021-09-16 ENCOUNTER — Ambulatory Visit: Payer: Medicare Other | Admitting: Dermatology

## 2021-09-22 ENCOUNTER — Encounter: Payer: Medicare Other | Admitting: Physical Therapy

## 2021-09-29 ENCOUNTER — Encounter: Payer: Medicare Other | Admitting: Physical Therapy

## 2021-10-06 ENCOUNTER — Encounter: Payer: Medicare Other | Admitting: Physical Therapy

## 2021-10-13 ENCOUNTER — Encounter: Payer: Medicare Other | Admitting: Physical Therapy

## 2021-10-20 ENCOUNTER — Encounter: Payer: Medicare Other | Admitting: Physical Therapy

## 2021-10-23 ENCOUNTER — Ambulatory Visit: Payer: Medicare Other | Admitting: Podiatry

## 2021-10-27 ENCOUNTER — Ambulatory Visit: Payer: Medicare Other | Admitting: Internal Medicine

## 2021-11-25 ENCOUNTER — Ambulatory Visit: Payer: Medicare Other

## 2022-03-25 IMAGING — DX DG CHEST 2V
2 series · 2 of 2 positions shown · non-contrast
Comparison: 7158

CLINICAL DATA: Shortness of breath

EXAM:
CHEST - 2 VIEW

[chest pa]
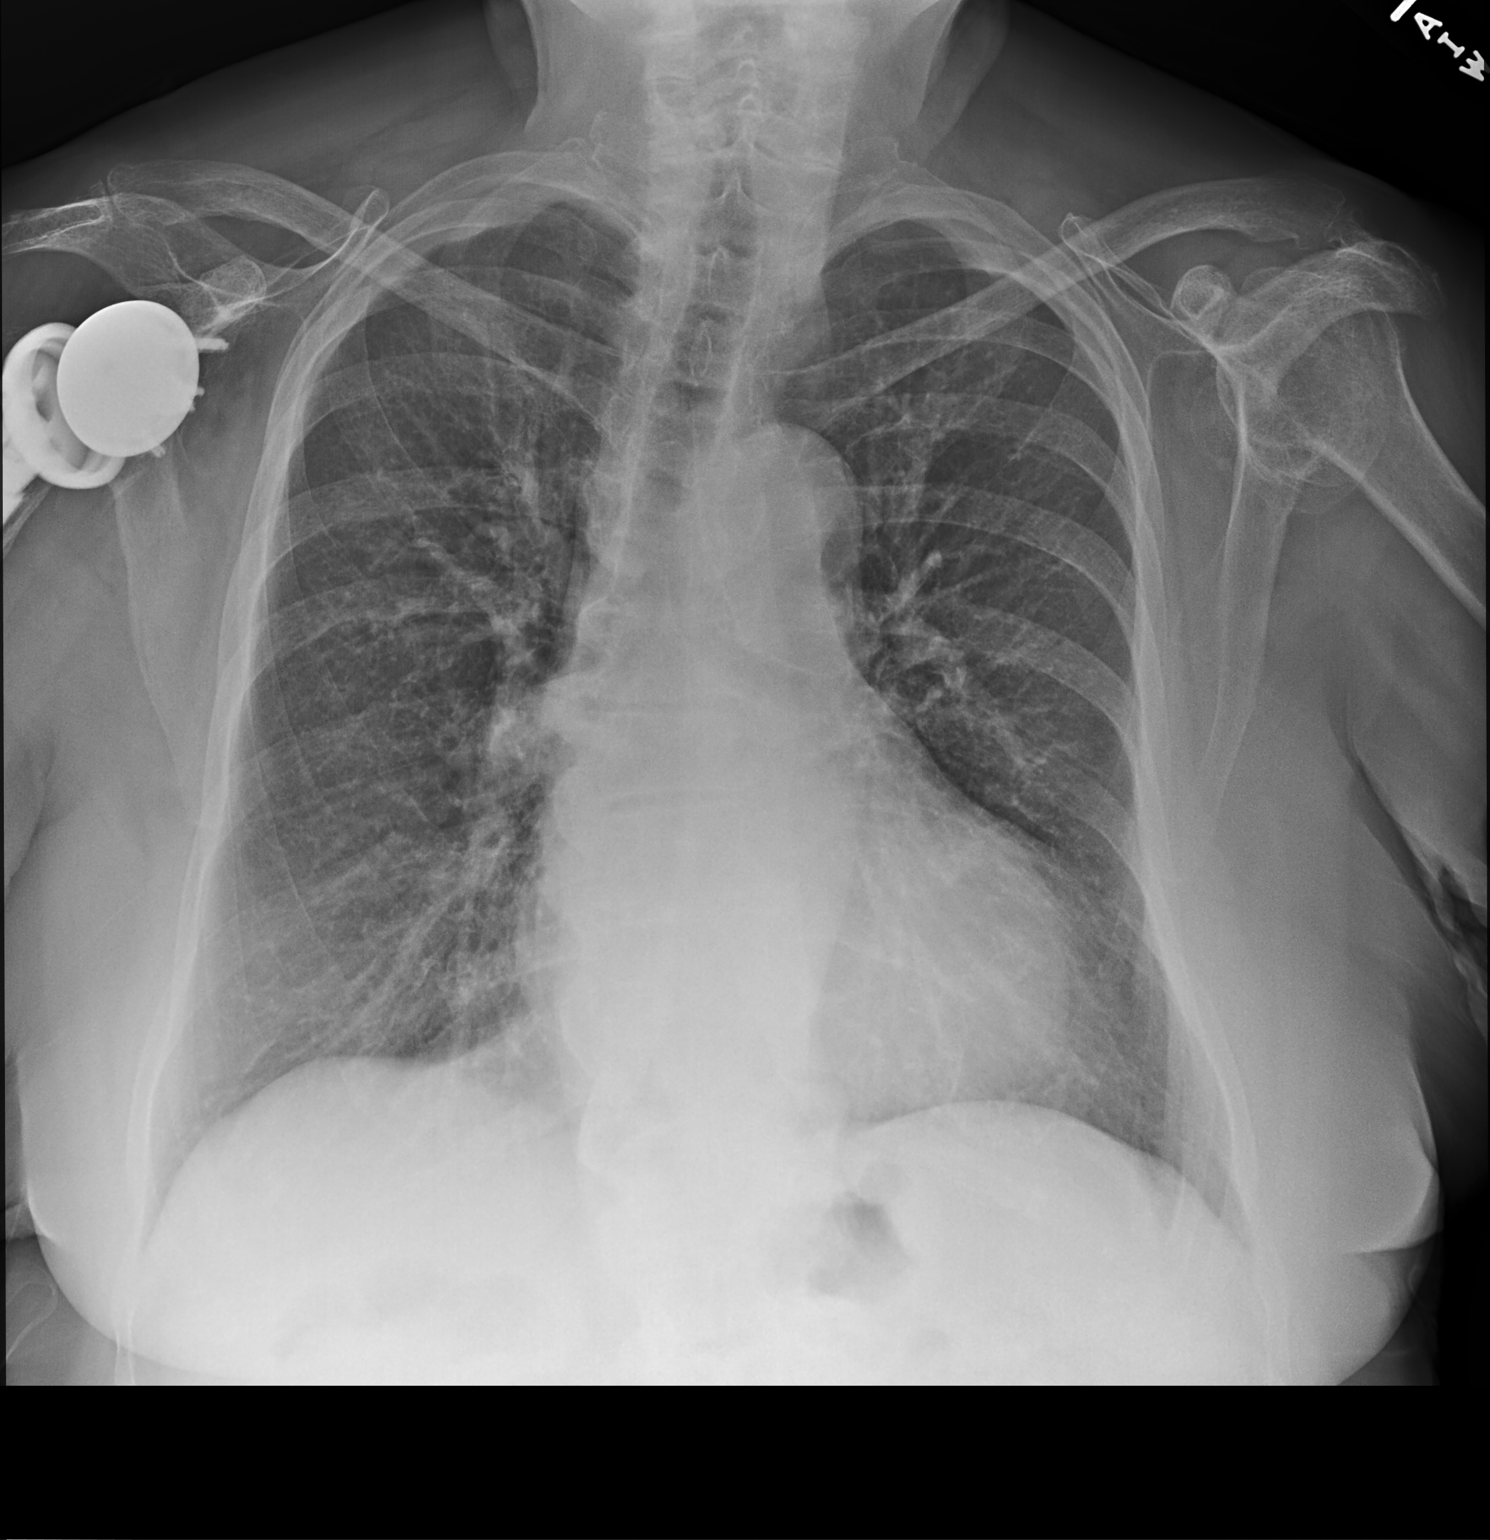

[chest lat]
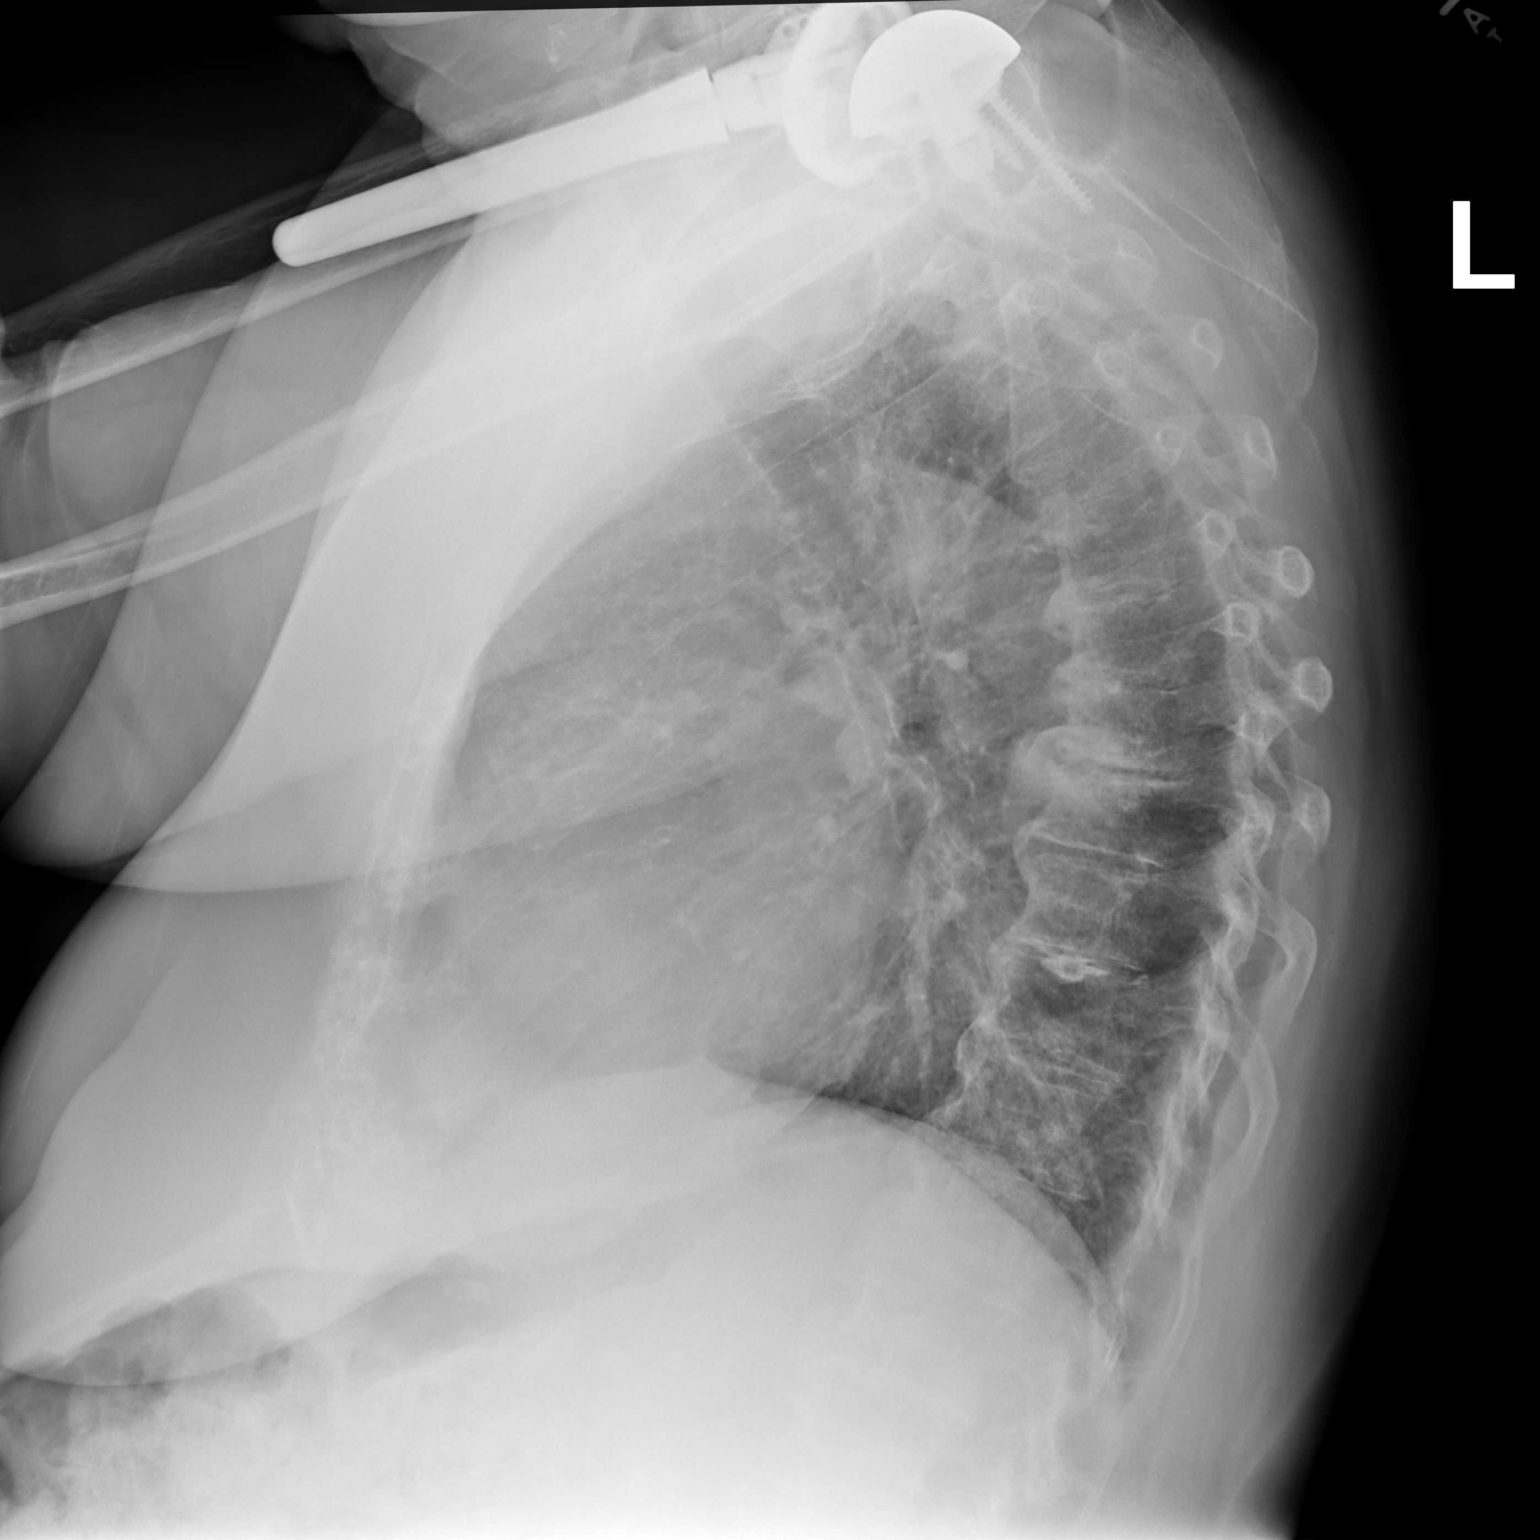

[2 of 2 positions shown; findings below may reference images not displayed]

FINDINGS: Mild chronic interstitial prominence. No pleural effusion or
pneumothorax. Stable cardiomediastinal contours with normal heart
size. No pleural effusion. Similar appearance of degenerative
changes of the thoracic spine. Interval right reverse shoulder
arthroplasty.
IMPRESSION: Mild chronic interstitial prominence. No acute process in the chest.
